# Patient Record
Sex: Male | Born: 1950
Health system: Southern US, Community
[De-identification: ages and names within clinical notes are randomized; demographics above are authoritative.]

## PROBLEM LIST (undated history)

## (undated) DIAGNOSIS — G4730 Sleep apnea, unspecified: Secondary | ICD-10-CM

## (undated) DIAGNOSIS — E119 Type 2 diabetes mellitus without complications: Secondary | ICD-10-CM

## (undated) DIAGNOSIS — I10 Essential (primary) hypertension: Secondary | ICD-10-CM

## (undated) DIAGNOSIS — E785 Hyperlipidemia, unspecified: Secondary | ICD-10-CM

## (undated) DIAGNOSIS — E11319 Type 2 diabetes mellitus with unspecified diabetic retinopathy without macular edema: Secondary | ICD-10-CM

## (undated) DIAGNOSIS — I219 Acute myocardial infarction, unspecified: Secondary | ICD-10-CM

## (undated) DIAGNOSIS — H269 Unspecified cataract: Secondary | ICD-10-CM

## (undated) DIAGNOSIS — G4733 Obstructive sleep apnea (adult) (pediatric): Secondary | ICD-10-CM

## (undated) DIAGNOSIS — C801 Malignant (primary) neoplasm, unspecified: Secondary | ICD-10-CM

## (undated) DIAGNOSIS — M1990 Unspecified osteoarthritis, unspecified site: Secondary | ICD-10-CM

## (undated) DIAGNOSIS — K219 Gastro-esophageal reflux disease without esophagitis: Secondary | ICD-10-CM

## (undated) DIAGNOSIS — T7840XA Allergy, unspecified, initial encounter: Secondary | ICD-10-CM

## (undated) DIAGNOSIS — K5909 Other constipation: Secondary | ICD-10-CM

## (undated) HISTORY — DX: Unspecified osteoarthritis, unspecified site: M19.90

## (undated) HISTORY — PX: APPENDECTOMY: SHX54

## (undated) HISTORY — DX: Acute myocardial infarction, unspecified: I21.9

## (undated) HISTORY — DX: Essential (primary) hypertension: I10

## (undated) HISTORY — DX: Sleep apnea, unspecified: G47.30

## (undated) HISTORY — DX: Hyperlipidemia, unspecified: E78.5

## (undated) HISTORY — DX: Malignant (primary) neoplasm, unspecified: C80.1

## (undated) HISTORY — DX: Type 2 diabetes mellitus without complications: E11.9

## (undated) HISTORY — DX: Gastro-esophageal reflux disease without esophagitis: K21.9

## (undated) HISTORY — DX: Type 2 diabetes mellitus with unspecified diabetic retinopathy without macular edema: E11.319

## (undated) HISTORY — PX: CHOLECYSTECTOMY: SHX55

## (undated) HISTORY — DX: Allergy, unspecified, initial encounter: T78.40XA

## (undated) HISTORY — DX: Obstructive sleep apnea (adult) (pediatric): G47.33

## (undated) HISTORY — DX: Other constipation: K59.09

## (undated) HISTORY — DX: Unspecified cataract: H26.9

## (undated) HISTORY — PX: PROSTATE SURGERY: SHX751

---

## 1998-01-23 ENCOUNTER — Encounter: Payer: Self-pay | Admitting: General Surgery

## 1998-01-23 ENCOUNTER — Inpatient Hospital Stay (HOSPITAL_COMMUNITY): Admission: EM | Admit: 1998-01-23 | Discharge: 1998-01-24 | Payer: Self-pay | Admitting: Emergency Medicine

## 1998-03-23 ENCOUNTER — Other Ambulatory Visit: Admission: RE | Admit: 1998-03-23 | Discharge: 1998-03-23 | Payer: Self-pay | Admitting: Urology

## 1998-08-19 ENCOUNTER — Emergency Department (HOSPITAL_COMMUNITY): Admission: EM | Admit: 1998-08-19 | Discharge: 1998-08-19 | Payer: Self-pay | Admitting: Emergency Medicine

## 1998-08-30 ENCOUNTER — Encounter: Payer: Self-pay | Admitting: Urology

## 1998-09-03 ENCOUNTER — Inpatient Hospital Stay (HOSPITAL_COMMUNITY): Admission: RE | Admit: 1998-09-03 | Discharge: 1998-09-06 | Payer: Self-pay | Admitting: Urology

## 1998-10-10 ENCOUNTER — Ambulatory Visit: Admission: RE | Admit: 1998-10-10 | Discharge: 1998-10-10 | Payer: Self-pay | Admitting: Pulmonary Disease

## 1998-10-23 ENCOUNTER — Ambulatory Visit: Admission: RE | Admit: 1998-10-23 | Discharge: 1998-10-23 | Payer: Self-pay | Admitting: Pulmonary Disease

## 1999-07-15 ENCOUNTER — Ambulatory Visit (HOSPITAL_COMMUNITY): Admission: RE | Admit: 1999-07-15 | Discharge: 1999-07-16 | Payer: Self-pay | Admitting: Urology

## 2004-01-05 ENCOUNTER — Ambulatory Visit: Payer: Self-pay | Admitting: Family Medicine

## 2004-01-16 ENCOUNTER — Ambulatory Visit: Payer: Self-pay | Admitting: Family Medicine

## 2004-02-02 ENCOUNTER — Ambulatory Visit: Payer: Self-pay | Admitting: Family Medicine

## 2004-05-03 ENCOUNTER — Ambulatory Visit: Payer: Self-pay | Admitting: Family Medicine

## 2004-07-24 ENCOUNTER — Emergency Department (HOSPITAL_COMMUNITY): Admission: EM | Admit: 2004-07-24 | Discharge: 2004-07-24 | Payer: Self-pay | Admitting: Emergency Medicine

## 2005-01-03 ENCOUNTER — Encounter: Admission: RE | Admit: 2005-01-03 | Discharge: 2005-03-02 | Payer: Self-pay | Admitting: Family Medicine

## 2009-08-31 ENCOUNTER — Encounter: Admission: RE | Admit: 2009-08-31 | Discharge: 2009-08-31 | Payer: Self-pay | Admitting: Emergency Medicine

## 2009-09-14 ENCOUNTER — Encounter: Admission: RE | Admit: 2009-09-14 | Discharge: 2009-09-14 | Payer: Self-pay | Admitting: Emergency Medicine

## 2010-01-08 ENCOUNTER — Encounter: Payer: Self-pay | Admitting: Gastroenterology

## 2010-04-02 NOTE — Letter (Signed)
Summary: [facility]EC Referral (unable to schedule) Notification  [facility]  [address] [place], [place] [address]   Phone: [phone]  Fax: [phone]      January 08, 2010 [name] 23-Nov-1950 MRN: [id number]   [facility] [place] [facility] [address] [place], [place]  [address]   Dear [name]:   Thank you for your kind referral of the above patient. We have attempted to schedule the recommended COLONOSCOPY but have been unable to schedule because:  _X_ The patient was not available by phone and/or has not returned our calls.  __ The patient declined to schedule the procedure at this time.  We appreciate the referral and hope that we will have the opportunity to treat this patient in the future.    Sincerely,   [facility]  Vania Rea. Jarold Motto M.D. Hedwig Morton. Juanda Chance M.D. Venita Lick. Russella Dar M.D. Wilhemina Bonito. Marina Goodell M.D. Barbette Hair. Arlyce Dice M.D. Iva Boop M.D. Cheron Every.D.

## 2010-07-19 NOTE — Op Note (Signed)
[place]  Patient:    [name]                       MRN: [id number] Proc. Date: 07/15/99 Adm. Date:  [id number] Attending:  Trisha Mangle                           Operative Report  PREOPERATIVE DIAGNOSIS:  Organic erectile dysfunction.  POSTOPERATIVE DIAGNOSIS:  Organic erectile dysfunction.  OPERATION PERFORMED:  Inflatable penile prosthesis placement (Mentor Alpha 1).  SURGEON:  Mark C. Vernie Ammons, M.D.  ANESTHESIA:  General.  ESTIMATED BLOOD LOSS:  20 cc.  DEVICE:  18 cm cylinders with 2 cm rear tip extenders on each side and a 75 cc lock-out reservoir.  COMPLICATIONS:  None.  INDICATIONS FOR PROCEDURE:  The patient is a 60 year old white male who was rendered impotent after a radical prostatectomy.  Conservative measures have been attempted using intraurethral injections, etc.  He is brought to the or for placement of a penile prosthesis understanding the risks, complications, alternatives and limitations.  He received ampicillin 1 gm and gentamicin 80 mg IV preoperatively.  DESCRIPTION OF PROCEDURE:  After informed consent patient brought to the major OR, placed on the table in the supine position, administered general anesthesia and the genitalia was sterilely scrubbed for 10 minutes and then prepped with Betadine and sterilely draped.  A 16 French Foley catheter was inserted in the bladder and antibiotic irrigation was inserted as well and left indwelling in the bladder.  I then made a midline medial raphe incision in the scrotum and using a combination of sharp and blunt technique, I dissected down to the corpora bilaterally.  Stay sutures of 2-0 Vicryls were placed in the corpora after clearing of the surrounding tissues from the corpora itself and corporotomies were made on both right and left sides.  They were lengthened with the curved Mayo scissors.  I then dilated each corpora with the Hagar dilators starting  at 8 and progressing to 13 mm on each side without difficulty both proximally and distally.  Each corpora was then irrigated with antibiotic solution and the measuring device was placed in the corpora.  To midcorporotomy each proximal corpora measured 9 cm and each distal corpora measured 11 cm.  The midline incision was then made and carried down to the rectus fascia which was divided in the midline and the rectus muscle bellies parted.  I was easily able to develop a retropubic pocket for the reservoir over on the right hand side and placed this 75 cc reservoir within this location and also instilled antibiotic irrigation.  I then closed the fascia with running 2-0 Vicryl suture.  The tubing exiting the inferior aspect of the incision.  The subcutaneous tissue was reapproximated loosely with a couple of interrupted chromic sutures and 0.5% Marcaine with epinephrine was used to infiltrate the incision and it was then closed with a running subcuticular 4-0 Vicryl suture. Prior to closing the incision, the tubing was tunneled down into the scrotum on the right hand side.  The 18 cm cylinders were then placed using the Furlow inserter in each corpora bilaterally.  The 2 cm rear tip extenders were then applied and the proximal aspects inserted.  The device was inflated fully and a good straight erection occurred with no kinking of the cylinders.  It was then deflated and I closed each corporotomy with running 2-0  Vicryl suture.  I then developed a pocket for the scrotal pump on the right hand side.  This was placed in a dependent portion laterally and I then filled the reservoir with 70 cc of sterile saline.  Redundant tubing was cut and connection between the single tubing from the pump to the reservoir was then made.  The device was again cycled and noted to work properly.  No excessive bleeding was noted.  The deep scrotal tissues were reapproximated with interrupted 3-0 chromic suture.   A second layer of scrotal tissue was then reapproximated with running 3-0 chromic and the skin of the scrotum was closed with a running 3-0 chromic suture.  The bladder was drained and connected to closed system drainage.  The superpubic incision was covered with Steri-Strips and a sterile occlusive dressing.  The scrotal incision was covered with a sterile gauze dressing, fluffs, 4 x 4s and a scrotal support.  The patient was awakened and taken to the recovery room in stable and satisfactory condition.  He tolerated the procedure well with no intraoperative complications.  Sponge, needle and instrument counts were reportedly correct x 2 at the end of the operation. DD:  07/15/99 TD:  07/16/99 Job: [id number]

## 2011-02-15 ENCOUNTER — Ambulatory Visit (INDEPENDENT_AMBULATORY_CARE_PROVIDER_SITE_OTHER): Payer: BC Managed Care – PPO

## 2011-02-15 DIAGNOSIS — E789 Disorder of lipoprotein metabolism, unspecified: Secondary | ICD-10-CM

## 2011-02-15 DIAGNOSIS — Z23 Encounter for immunization: Secondary | ICD-10-CM

## 2011-02-15 DIAGNOSIS — I10 Essential (primary) hypertension: Secondary | ICD-10-CM

## 2011-02-15 DIAGNOSIS — R300 Dysuria: Secondary | ICD-10-CM

## 2011-02-15 DIAGNOSIS — E119 Type 2 diabetes mellitus without complications: Secondary | ICD-10-CM

## 2011-10-26 ENCOUNTER — Other Ambulatory Visit: Payer: Self-pay | Admitting: Emergency Medicine

## 2011-11-09 ENCOUNTER — Ambulatory Visit (INDEPENDENT_AMBULATORY_CARE_PROVIDER_SITE_OTHER): Payer: BC Managed Care – PPO | Admitting: Emergency Medicine

## 2011-11-09 VITALS — BP 131/79 | HR 104 | Temp 98.5°F | Resp 16 | Ht 69.0 in | Wt 240.0 lb

## 2011-11-09 DIAGNOSIS — C61 Malignant neoplasm of prostate: Secondary | ICD-10-CM

## 2011-11-09 DIAGNOSIS — Z9119 Patient's noncompliance with other medical treatment and regimen: Secondary | ICD-10-CM

## 2011-11-09 DIAGNOSIS — L84 Corns and callosities: Secondary | ICD-10-CM

## 2011-11-09 DIAGNOSIS — E782 Mixed hyperlipidemia: Secondary | ICD-10-CM

## 2011-11-09 DIAGNOSIS — E119 Type 2 diabetes mellitus without complications: Secondary | ICD-10-CM

## 2011-11-09 DIAGNOSIS — E118 Type 2 diabetes mellitus with unspecified complications: Secondary | ICD-10-CM

## 2011-11-09 LAB — POCT GLYCOSYLATED HEMOGLOBIN (HGB A1C): Hemoglobin A1C: 8.1

## 2011-11-09 MED ORDER — CARVEDILOL 3.125 MG PO TABS: 3.1250 mg | ORAL_TABLET | Freq: Two times a day (BID) | ORAL | Status: DC

## 2011-11-09 MED ORDER — METFORMIN HCL 1000 MG PO TABS: 1000.0000 mg | ORAL_TABLET | Freq: Two times a day (BID) | ORAL | Status: DC

## 2011-11-09 MED ORDER — GABAPENTIN 300 MG PO CAPS: 300.0000 mg | ORAL_CAPSULE | Freq: Three times a day (TID) | ORAL | Status: DC

## 2011-11-09 MED ORDER — SIMVASTATIN 40 MG PO TABS: 40.0000 mg | ORAL_TABLET | Freq: Every evening | ORAL | Status: DC

## 2011-11-09 MED ORDER — LISINOPRIL 10 MG PO TABS: 10.0000 mg | ORAL_TABLET | Freq: Every day | ORAL | Status: DC

## 2011-11-09 MED ORDER — GLIMEPIRIDE 4 MG PO TABS: 4.0000 mg | ORAL_TABLET | Freq: Every day | ORAL | Status: DC

## 2011-11-09 MED ORDER — AZELASTINE HCL 0.1 % NA SOLN: 1.0000 | Freq: Two times a day (BID) | NASAL | Status: DC

## 2011-11-09 MED ORDER — CIPROFLOXACIN HCL 500 MG PO TABS: 500.0000 mg | ORAL_TABLET | Freq: Two times a day (BID) | ORAL | Status: AC

## 2011-11-09 NOTE — Progress Notes (Addendum)
Date:  11/09/2011   Name:  [name]   DOB:  [birth year]   MRN:  [id number] Gender: male Age: 61 y.o.  PCP:  Tally Due, MD    Chief Complaint: Medication Refill and Foot Injury   History of Present Illness:  [name] is a 61 y.o. pleasant patient who presents with the following:  NIDDM.  Has neuropathy of feet.  Noticed a non healing sore on his left great toe from new boots.  First noticed 4-5 days ago.  Tried to soak it to make it better and has not improved.  No fever or chills.  No other complaints.  FBS not been measuring.  02/15/11 HBA1C was 9.9.  Non smoker.  Chews tobacco.  Not compliant with diet.  There is no problem list on file for this patient.   No past medical history on file.  No past surgical history on file.  History  Substance Use Topics  . Smoking status: Never Smoker   . Smokeless tobacco: Current User    Types: Chew  . Alcohol Use: Not on file    No family history on file.  Allergies  Allergen Reactions  . Darvocet (Propoxyphene-Acetaminophen) Hives    Medication list has been reviewed and updated.  Current Outpatient Prescriptions on File Prior to Visit  Medication Sig Dispense Refill  . azelastine (ASTELIN) 137 MCG/SPRAY nasal spray Place 1 spray into the nose 2 (two) times daily. Use in each nostril as directed      . carvedilol (COREG) 3.125 MG tablet Take 3.125 mg by mouth 2 (two) times daily with a meal.      . gabapentin (NEURONTIN) 300 MG capsule Take 300 mg by mouth 3 (three) times daily.      Marland Kitchen lisinopril (PRINIVIL,ZESTRIL) 10 MG tablet Take 10 mg by mouth daily.      . metFORMIN (GLUCOPHAGE) 1000 MG tablet Take 1,000 mg by mouth 2 (two) times daily with a meal.      . simvastatin (ZOCOR) 40 MG tablet Take 40 mg by mouth every evening.        Review of Systems:  As per HPI, otherwise negative.    Physical Examination: Filed Vitals:   11/09/11 1337  BP: 131/79  Pulse: 104  Temp: 98.5 F (36.9 C)    Resp: 16   Filed Vitals:   11/09/11 1337  Height: 5\' 9"  (1.753 m)  Weight: 240 lb (108.863 kg)   Body mass index is 35.44 kg/(m^2). Ideal Body Weight: Weight in (lb) to have BMI = 25: 168.9    GEN: WDWN, NAD, Non-toxic, Alert & Oriented x 3 HEENT: Atraumatic, Normocephalic.  Ears and Nose: No external deformity. EXTR: No clubbing/cyanosis/edema  Pulses intact feet 2+.  Left great toe medial aspect has callous of great toe.  Minimal cellulitis. NEURO: Normal gait.  PSYCH: Normally interactive. Conversant. Not depressed or anxious appearing.  Calm demeanor.    Assessment and Plan: NIDDM Callous great toe HBA1C Debride callous Cipro for cellulitis  Discussed with patient the importance of following a diet and working dilligently to ger his sugar under control.  To check his sugar FBS daily and add gimepiride 4 mg daily.  Follow up in 1 month for repeat labs.  STOP TOBACCO.  Carmelina Dane, MD   Results for orders placed in visit on 11/09/11  POCT GLYCOSYLATED HEMOGLOBIN (HGB A1C)      Component Value Range   Hemoglobin A1C 8.1  Urine microalb/monofilament exam at f/u I have reviewed and agree with documentation. Robert P. Merla Riches, M.D.

## 2012-01-31 ENCOUNTER — Other Ambulatory Visit: Payer: Self-pay | Admitting: Emergency Medicine

## 2012-03-10 ENCOUNTER — Other Ambulatory Visit: Payer: Self-pay | Admitting: Emergency Medicine

## 2012-03-11 NOTE — Telephone Encounter (Signed)
Please pull chart.

## 2012-03-12 NOTE — Telephone Encounter (Signed)
[id number] is in [place] Pool at nurse's station.

## 2012-03-21 NOTE — Telephone Encounter (Signed)
Chart may have inadvertently been filed back. Please pull again.

## 2012-03-22 NOTE — Telephone Encounter (Signed)
[id number] in [place] Pool at nurse's station.

## 2012-03-23 NOTE — Telephone Encounter (Signed)
Omeprazole sent to pharmacy per refill request.  Pt needs OV, was due for repeat labs in Oct 2013

## 2012-03-28 ENCOUNTER — Ambulatory Visit (INDEPENDENT_AMBULATORY_CARE_PROVIDER_SITE_OTHER): Payer: BC Managed Care – PPO | Admitting: Emergency Medicine

## 2012-03-28 VITALS — BP 130/86 | HR 80 | Temp 98.6°F | Resp 18 | Ht 70.5 in | Wt 254.8 lb

## 2012-03-28 DIAGNOSIS — J018 Other acute sinusitis: Secondary | ICD-10-CM

## 2012-03-28 MED ORDER — AMOXICILLIN-POT CLAVULANATE 875-125 MG PO TABS: 1.0000 | ORAL_TABLET | Freq: Two times a day (BID) | ORAL | Status: DC

## 2012-03-28 MED ORDER — PSEUDOEPHEDRINE-GUAIFENESIN ER 60-600 MG PO TB12: 1.0000 | ORAL_TABLET | Freq: Two times a day (BID) | ORAL | Status: DC

## 2012-03-28 NOTE — Patient Instructions (Addendum)

## 2012-03-28 NOTE — Progress Notes (Signed)
[facility] [address], [place] [address] [phone]  Date:  03/28/2012   Name:  [name]   DOB:  1951/01/05   MRN:  [id number]  PCP:  Tally Due, MD    Chief Complaint: Sinusitis, Generalized Body Aches and fever, chiils   History of Present Illness:  [name] is a 62 y.o. very pleasant male patient who presents with the following:  Ill with fever, chills, and cough for past week. Nonproductive cough.  Moderate nasal congestion and drainage.  Purulent in nature.  Has post nasal drainage.  Purulent in nature.  No wheezing or shortness of breath.  Relates a significant improvement in his FBS since medication and weight loss since last visit.  No nausea or vomiting.  No stool change.  There is no problem list on file for this patient.   No past medical history on file.  No past surgical history on file.  History  Substance Use Topics  . Smoking status: Never Smoker   . Smokeless tobacco: Current User    Types: Chew  . Alcohol Use: Not on file    No family history on file.  Allergies  Allergen Reactions  . Darvocet (Propoxyphene-Acetaminophen) Hives    Medication list has been reviewed and updated.  Current Outpatient Prescriptions on File Prior to Visit  Medication Sig Dispense Refill  . aspirin 81 MG tablet Take 81 mg by mouth daily.      Marland Kitchen azelastine (ASTELIN) 137 MCG/SPRAY nasal spray Place 1 spray into the nose 2 (two) times daily. Use in each nostril as directed  30 mL  5  . carvedilol (COREG) 3.125 MG tablet Take 1 tablet (3.125 mg total) by mouth 2 (two) times daily with a meal.  60 tablet  5  . glimepiride (AMARYL) 4 MG tablet Take 1 tablet (4 mg total) by mouth daily before breakfast.  30 tablet  5  . ketoconazole (NIZORAL) 2 % cream Apply topically daily.      Marland Kitchen lisinopril (PRINIVIL,ZESTRIL) 10 MG tablet Take 1 tablet (10 mg total) by mouth daily.  30 tablet  5  . metFORMIN (GLUCOPHAGE) 1000 MG tablet Take 1  tablet (1,000 mg total) by mouth 2 (two) times daily with a meal.  60 tablet  5  . omeprazole (PRILOSEC) 40 MG capsule TAKE ONE CAPSULE BY MOUTH EVERY DAY  30 capsule  1  . simvastatin (ZOCOR) 40 MG tablet TAKE 1 TABLET (40 MG TOTAL) BY MOUTH EVERY EVENING.  30 tablet  0  . gabapentin (NEURONTIN) 300 MG capsule Take 1 capsule (300 mg total) by mouth 3 (three) times daily.  90 capsule  5    Review of Systems: As per HPI, otherwise negative.    Physical Examination: Filed Vitals:   03/28/12 1320  BP: 130/86  Pulse: 80  Temp: 98.6 F (37 C)  Resp: 18   Filed Vitals:   03/28/12 1320  Height: 5' 10.5" (1.791 m)  Weight: 254 lb 12.8 oz (115.577 kg)   Body mass index is 36.04 kg/(m^2). Ideal Body Weight: Weight in (lb) to have BMI = 25: 176.4   GEN: WDWN, NAD, Non-toxic, A & O x 3 HEENT: Atraumatic, Normocephalic. Neck supple. No masses, No LAD. Ears and Nose: No external deformity. CV: RRR, No M/G/R. No JVD. No thrill. No extra heart sounds. PULM: CTA B, no wheezes, crackles, rhonchi. No retractions. No resp. distress. No accessory muscle use. ABD: S, NT, ND, +BS.  No rebound. No HSM. EXTR: No c/c/e NEURO Normal gait.  PSYCH: Normally interactive. Conversant. Not depressed or anxious appearing.  Calm demeanor.    Assessment and Plan: Sinusitis augmentin mucinex d  Carmelina Dane, MD

## 2012-04-08 ENCOUNTER — Other Ambulatory Visit: Payer: Self-pay | Admitting: Physician Assistant

## 2012-04-27 DIAGNOSIS — E119 Type 2 diabetes mellitus without complications: Secondary | ICD-10-CM | POA: Insufficient documentation

## 2012-05-12 ENCOUNTER — Other Ambulatory Visit: Payer: Self-pay | Admitting: Emergency Medicine

## 2012-05-12 NOTE — Telephone Encounter (Signed)
Needs OV, labs 

## 2012-06-18 ENCOUNTER — Other Ambulatory Visit: Payer: Self-pay | Admitting: Physician Assistant

## 2012-07-13 ENCOUNTER — Ambulatory Visit (INDEPENDENT_AMBULATORY_CARE_PROVIDER_SITE_OTHER): Payer: BC Managed Care – PPO | Admitting: Family Medicine

## 2012-07-13 ENCOUNTER — Ambulatory Visit: Payer: BC Managed Care – PPO

## 2012-07-13 VITALS — BP 138/77 | HR 77 | Temp 98.2°F | Resp 18 | Wt 240.0 lb

## 2012-07-13 DIAGNOSIS — M25569 Pain in unspecified knee: Secondary | ICD-10-CM

## 2012-07-13 DIAGNOSIS — M545 Low back pain, unspecified: Secondary | ICD-10-CM

## 2012-07-13 DIAGNOSIS — M25562 Pain in left knee: Secondary | ICD-10-CM

## 2012-07-13 MED ORDER — CYCLOBENZAPRINE HCL 5 MG PO TABS: ORAL_TABLET | ORAL | Status: DC

## 2012-07-13 MED ORDER — TRAMADOL HCL 50 MG PO TABS: 50.0000 mg | ORAL_TABLET | Freq: Four times a day (QID) | ORAL | Status: DC | PRN

## 2012-07-13 NOTE — Progress Notes (Signed)
Subjective:    Patient ID: [name], male    DOB: [birth year], 62 y.o.   MRN: [id number]  HPI [name] is a 62 y.o. male  PCP: [facility] - different doctors past few years.   Back pain into L leg and knee - started 2 weeks ago after carrying heavy pipe for 2 days. Started in low back initially - night after moving the pipe.  nki during lifting, just later that night. then noticed pain into both legs more 2 days later. Hx of prostate cancer - wears depends past 14 years for urinary incontinence with lifting or bending.  Does note a little more predominance of the urinary incontinence since back has been hurting, or with lifting, but back to baseline now. No fecal incontinence. Slight saddle numbness and pain noted last night and yesterday evening after lifting something heavy.  These symptoms resolved today. - no further saddle symptoms. Left leg felt weak few days ago - started to fall/give away. Pain down outside of L leg - to knee. Felt spasms/jumping into outside of leg muscle. Persistent soreness in outside of leg. Hurts to put pressure on outside of the leg.  Back better in seated position.   Tx: none.  Hx of diabetic neuropathy in feet. On gabapentin for neuropathy. Hx of diabetes home blood sugars 97 up to 190. Last A1c 8.1 on 11/09/11.   Review of Systems  Musculoskeletal: Positive for myalgias (outside of L leg with occasional numbness on outside of leg. ), back pain and arthralgias.       Objective:   Physical Exam  Vitals reviewed. Constitutional: He is oriented to person, place, and time.  Overweight.   HENT:  Head: Normocephalic and atraumatic.  Pulmonary/Chest: Effort normal.  Musculoskeletal:       Back:  Neurological: He is alert and oriented to person, place, and time. No sensory deficit. He displays no Babinski's sign on the right side. He displays no Babinski's sign on the left side.  Reflex Scores:      Patellar reflexes are 0 on the right side and 0 on the  left side.      Achilles reflexes are 0 on the right side and 0 on the left side. Able to heel and toe walk, equal le strength testing.   Skin: Skin is warm and dry. No rash noted.  Psychiatric: He has a normal mood and affect. His behavior is normal.   [facility] reading (PRIMARY) by  Dr. Neva Seat: LS spine: decreased disc space L1-2 and L4-5.     Assessment & Plan:  [name] is a 62 y.o. male Lower back pain - Plan: DG Lumbar Spine Complete, cyclobenzaprine (FLEXERIL) 5 MG tablet, traMADol (ULTRAM) 50 MG tablet  Pain in joint, lower leg, left - Plan: traMADol (ULTRAM) 50 MG tablet  LBP, with likely overuse lumbar strain vs HNP/bulging disc.   Initially admitted to possible saddle sx's, but not experiencing those today and chronic incontinence at baseline today. Negative SLR and strength intact into extremities on exam.  Will try initial pain relief with ultram Q6 hours, flexeril if needed for spasm - caution with combining - additive side effects discussed, avoid lifting/twisting/bending, heat or ice to affected area and recheck in 2-3 days - sooner if worse, or to ER if any recurrence of saddle pain/numbness or change in incontinence.  Understanding expressed. Deferred prednisone and nsaids at this point given hx of dm, htn, hyperlipidemia.  If not improving in next few  days, consider prednisone but would check glycemic control at that point.   Plans on following up for arm pain - intermittent for some time - hx of broken bone in that arm in past . Can follow up at next ov.   Meds ordered this encounter  Medications  . cyclobenzaprine (FLEXERIL) 5 MG tablet    Sig: 1 pill by mouth up to every 8 hours as needed. Start with one pill by mouth each bedtime as needed due to sedation    Dispense:  15 tablet    Refill:  0  . traMADol (ULTRAM) 50 MG tablet    Sig: Take 1 tablet (50 mg total) by mouth every 6 (six) hours as needed for pain.    Dispense:  20 tablet    Refill:  0     Patient  Instructions  Start the muscle relaxant (flexeril) up to every 8 hours as needed, heat or ice to sore area for 15 minutes at a time as needed, tramadol if needed for pain, but be careful combining this with flexeril.  No lifting/bending/twisting until seen in follow up in 2-3 days. Return to the clinic or go to the nearest emergency room if any of your symptoms worsen or new symptoms occur, including if the numbness or pain in the genital area or change in incontinence returns.

## 2012-07-13 NOTE — Patient Instructions (Addendum)
Start the muscle relaxant (flexeril) up to every 8 hours as needed, heat or ice to sore area for 15 minutes at a time as needed, tramadol if needed for pain, but be careful combining this with flexeril.  No lifting/bending/twisting until seen in follow up in 2-3 days. Return to the clinic or go to the nearest emergency room if any of your symptoms worsen or new symptoms occur, including if the numbness or pain in the genital area or change in incontinence returns.

## 2012-07-16 ENCOUNTER — Ambulatory Visit (INDEPENDENT_AMBULATORY_CARE_PROVIDER_SITE_OTHER): Payer: BC Managed Care – PPO | Admitting: Family Medicine

## 2012-07-16 VITALS — BP 142/82 | HR 72 | Temp 98.4°F | Resp 18 | Ht 69.5 in | Wt 234.0 lb

## 2012-07-16 DIAGNOSIS — M545 Low back pain, unspecified: Secondary | ICD-10-CM

## 2012-07-16 DIAGNOSIS — Z5189 Encounter for other specified aftercare: Secondary | ICD-10-CM

## 2012-07-16 DIAGNOSIS — S39012D Strain of muscle, fascia and tendon of lower back, subsequent encounter: Secondary | ICD-10-CM

## 2012-07-16 NOTE — Patient Instructions (Addendum)
Continue muscle relaxants and pain medication as necessary  In the long run you need to work hard on eating less to lose weight.  Try to avoid heavy lifting and straining.
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
## 2012-07-26 ENCOUNTER — Other Ambulatory Visit: Payer: Self-pay | Admitting: Physician Assistant

## 2012-09-02 ENCOUNTER — Other Ambulatory Visit: Payer: Self-pay | Admitting: Physician Assistant

## 2012-09-25 ENCOUNTER — Other Ambulatory Visit: Payer: Self-pay | Admitting: Physician Assistant

## 2012-09-26 ENCOUNTER — Ambulatory Visit (INDEPENDENT_AMBULATORY_CARE_PROVIDER_SITE_OTHER): Payer: BC Managed Care – PPO | Admitting: Family Medicine

## 2012-09-26 DIAGNOSIS — I10 Essential (primary) hypertension: Secondary | ICD-10-CM

## 2012-09-26 DIAGNOSIS — E119 Type 2 diabetes mellitus without complications: Secondary | ICD-10-CM

## 2012-09-26 DIAGNOSIS — E7800 Pure hypercholesterolemia, unspecified: Secondary | ICD-10-CM

## 2012-09-26 DIAGNOSIS — T23122A Burn of first degree of single left finger (nail) except thumb, initial encounter: Secondary | ICD-10-CM

## 2012-09-26 DIAGNOSIS — T23129A Burn of first degree of unspecified single finger (nail) except thumb, initial encounter: Secondary | ICD-10-CM

## 2012-09-26 DIAGNOSIS — C61 Malignant neoplasm of prostate: Secondary | ICD-10-CM

## 2012-09-26 DIAGNOSIS — Z23 Encounter for immunization: Secondary | ICD-10-CM

## 2012-09-26 LAB — COMPREHENSIVE METABOLIC PANEL
ALT: 40 U/L (ref 0–53)
AST: 21 U/L (ref 0–37)
Alkaline Phosphatase: 74 U/L (ref 39–117)
BUN: 11 mg/dL (ref 6–23)
Calcium: 9.1 mg/dL (ref 8.4–10.5)
Creat: 0.72 mg/dL (ref 0.50–1.35)
Total Bilirubin: 0.7 mg/dL (ref 0.3–1.2)

## 2012-09-26 LAB — LIPID PANEL
Cholesterol: 218 mg/dL — ABNORMAL HIGH (ref 0–200)
Total CHOL/HDL Ratio: 4 Ratio
Triglycerides: 175 mg/dL — ABNORMAL HIGH (ref <150)

## 2012-09-26 LAB — POCT URINALYSIS DIPSTICK
Bilirubin, UA: NEGATIVE
Glucose, UA: 100
Ketones, UA: NEGATIVE
Spec Grav, UA: 1.025
Urobilinogen, UA: 0.2

## 2012-09-26 LAB — POCT CBC
HCT, POC: 48.7 % (ref 43.5–53.7)
Hemoglobin: 15.4 g/dL (ref 14.1–18.1)
Lymph, poc: 2.5 (ref 0.6–3.4)
MCH, POC: 31.4 pg — AB (ref 27–31.2)
MCHC: 31.6 g/dL — AB (ref 31.8–35.4)
POC LYMPH PERCENT: 33.3 %L (ref 10–50)
POC MID %: 8 %M (ref 0–12)
WBC: 7.6 10*3/uL (ref 4.6–10.2)

## 2012-09-26 LAB — POCT GLYCOSYLATED HEMOGLOBIN (HGB A1C): Hemoglobin A1C: 7

## 2012-09-26 LAB — MICROALBUMIN, URINE: Microalb, Ur: 1.31 mg/dL (ref 0.00–1.89)

## 2012-09-26 MED ORDER — AZELASTINE HCL 0.1 % NA SOLN: 2.0000 | Freq: Two times a day (BID) | NASAL | Status: DC

## 2012-09-26 MED ORDER — GLIMEPIRIDE 4 MG PO TABS: 4.0000 mg | ORAL_TABLET | Freq: Every day | ORAL | Status: DC

## 2012-09-26 MED ORDER — AMOXICILLIN-POT CLAVULANATE 875-125 MG PO TABS: 1.0000 | ORAL_TABLET | Freq: Two times a day (BID) | ORAL | Status: DC

## 2012-09-26 MED ORDER — CARVEDILOL 3.125 MG PO TABS: 3.1250 mg | ORAL_TABLET | Freq: Two times a day (BID) | ORAL | Status: DC

## 2012-09-26 MED ORDER — METFORMIN HCL 1000 MG PO TABS: 1000.0000 mg | ORAL_TABLET | Freq: Two times a day (BID) | ORAL | Status: DC

## 2012-09-26 MED ORDER — LISINOPRIL 10 MG PO TABS: 10.0000 mg | ORAL_TABLET | Freq: Every day | ORAL | Status: DC

## 2012-09-26 MED ORDER — GABAPENTIN 300 MG PO CAPS: 300.0000 mg | ORAL_CAPSULE | Freq: Three times a day (TID) | ORAL | Status: DC

## 2012-09-26 MED ORDER — OMEPRAZOLE 40 MG PO CPDR: 40.0000 mg | DELAYED_RELEASE_CAPSULE | Freq: Every day | ORAL | Status: DC

## 2012-09-26 MED ORDER — SIMVASTATIN 40 MG PO TABS: 40.0000 mg | ORAL_TABLET | Freq: Every day | ORAL | Status: DC

## 2012-09-26 NOTE — Progress Notes (Signed)
[address]   [place], [place]  [address]   [phone]  Subjective:    Patient ID: [name], male    DOB: [birth year], 61 y.o.   MRN: [id number]  HPI This 62 y.o. male presents for evaluation of the following:  1.  DMII:  Taking Metformin and Amaryl; sugars running 98-120s.  +neuropathy.  Checks every morning.  Last eye exam 2009.  +neuropathy.  [facility] in [place].  Pneumovax a few years ago.  2. Burn on finger: burn occurred 5 days ago.    Took needle to drain it this morning; yellow white drainage expressed.  Has become very tender.   Occurred five days ago.  No fever/chills/sweats.  No malaise or fatigue.  Burn on plasmark; +copper material.  Copper stuck to skin.  Last Tetanus vaccine several years ago.    3.  Hyperlipidemia:  Compliance with Simvastatin daily.  Needs refill; due for labs.    4.  Diabetic peripheral neuropathy:  Gabapentin two tablets tid with good results.  If misses medication, suffers with burning, aching. No barefooted; checking feet nightly.  5. Lower back pain:  Back is improving; pain for 2-3 months.    6. HTN: complaince with Lisinopril.  Does not check BP at home.  7. Prostate cancer:  Age 71; s/p prostatectomy; needs to schedule CPE with Dr. Perrin Maltese for PSA and exam.  Nocturia x 6; chronic incontinence; wears a pad.   Review of Systems  Constitutional: Negative for fever, diaphoresis and fatigue.  Respiratory: Negative for shortness of breath, wheezing and stridor.   Cardiovascular: Positive for chest pain. Negative for palpitations and leg swelling.       Chest pain for just a few seconds last week with extreme heat exposure.  Gastrointestinal: Positive for constipation. Negative for nausea, vomiting, abdominal pain, diarrhea, blood in stool, anal bleeding and rectal pain.  Genitourinary: Negative for dysuria and frequency.  Skin: Positive for color change, pallor and wound. Negative for rash.  Neurological: Positive for numbness. Negative for  dizziness, tremors, seizures, syncope, facial asymmetry, speech difficulty, weakness, light-headedness and headaches.    Past Medical History  Diagnosis Date  . Diabetes mellitus without complication   . Hypertension   . Hyperlipidemia   . GERD (gastroesophageal reflux disease)   . Cancer     Prostate cancer age 74; Lupron treatments followed by prostatectomy.  . Myocardial infarction     age 83; no stenting.  . OSA (obstructive sleep apnea)     non-compliant with CPAP.    Past Surgical History  Procedure Laterality Date  . Cholecystectomy    . Prostate surgery      Prior to Admission medications   Medication Sig Start Date End Date Taking? Authorizing Provider  aspirin 81 MG tablet Take 81 mg by mouth daily.   Yes Historical Provider, MD  azelastine (ASTELIN) 137 MCG/SPRAY nasal spray Place 1 spray into the nose 2 (two) times daily. Use in each nostril as directed 11/09/11  Yes Phillips Odor, MD  gabapentin (NEURONTIN) 300 MG capsule Take 1 capsule (300 mg total) by mouth 3 (three) times daily. 09/02/12  Yes Shade Flood, MD  lisinopril (PRINIVIL,ZESTRIL) 10 MG tablet TAKE 1 TABLET (10 MG TOTAL) BY MOUTH DAILY. 07/26/12  Yes Ryan M Dunn, PA-C  metFORMIN (GLUCOPHAGE) 1000 MG tablet Take 1 tablet (1,000 mg total) by mouth 2 (two) times daily with a meal. PATIENT NEEDS OFFICE VISIT FOR ADDITIONAL REFILLS 07/26/12  Yes Sondra Barges, PA-C  omeprazole (PRILOSEC) 40 MG capsule TAKE ONE CAPSULE BY MOUTH EVERY DAY 03/10/12  Yes Godfrey Pick, PA-C  simvastatin (ZOCOR) 40 MG tablet TAKE 1 TABLET (40 MG TOTAL) BY MOUTH EVERY EVENING. 01/31/12  Yes Morrell Riddle, PA-C  carvedilol (COREG) 3.125 MG tablet Take 1 tablet (3.125 mg total) by mouth 2 (two) times daily with a meal. PATIENT NEEDS OFFICE VISIT FOR ADDITIONAL REFILLS 07/26/12   Raymon Mutton Dunn, PA-C  cyclobenzaprine (FLEXERIL) 5 MG tablet 1 pill by mouth up to every 8 hours as needed. Start with one pill by mouth each bedtime as needed due to  sedation 07/13/12   Shade Flood, MD  glimepiride (AMARYL) 4 MG tablet TAKE 1 TABLET (4 MG TOTAL) BY MOUTH DAILY BEFORE BREAKFAST. 07/26/12   Raymon Mutton Dunn, PA-C  ketoconazole (NIZORAL) 2 % cream Apply topically daily.    Historical Provider, MD  traMADol (ULTRAM) 50 MG tablet Take 1 tablet (50 mg total) by mouth every 6 (six) hours as needed for pain. 07/13/12   Shade Flood, MD    Allergies  Allergen Reactions  . Darvocet (Propoxyphene-Acetaminophen) Hives    History   Social History  . Marital Status: Single    Spouse Name: N/A    Number of Children: N/A  . Years of Education: N/A   Occupational History  . Not on file.   Social History Main Topics  . Smoking status: Never Smoker   . Smokeless tobacco: Current User    Types: Chew  . Alcohol Use: 0.0 oz/week     Comment: social  . Drug Use: No  . Sexually Active: Not Currently   Other Topics Concern  . Not on file   Social History Narrative   Marital status: divorced     Children: 1      Employment:        Tobacco: none; chew tobacco      Alcohol: beers on weekends      Exercise: none    Family History  Problem Relation Age of Onset  . Cancer Mother     leukemia  . Diabetes Mother   . Cancer Father 64    lung cancer with mets  . Heart disease Maternal Grandmother   . Cancer Maternal Grandfather   . Heart disease Paternal Grandmother   . Cancer Paternal Grandfather        Objective:   Physical Exam  Nursing note and vitals reviewed. Constitutional: He is oriented to person, place, and time. He appears well-developed and well-nourished. No distress.  HENT:  Head: Normocephalic and atraumatic.  Right Ear: External ear normal.  Left Ear: External ear normal.  Nose: Nose normal.  Mouth/Throat: Oropharynx is clear and moist.  Eyes: Conjunctivae and EOM are normal. Pupils are equal, round, and reactive to light.  Neck: Normal range of motion. Neck supple. No JVD present. No thyromegaly present.    Cardiovascular: Normal rate, regular rhythm, normal heart sounds and intact distal pulses.  Exam reveals no gallop and no friction rub.   No murmur heard. Pulmonary/Chest: Effort normal and breath sounds normal. He has no wheezes. He has no rales.  Abdominal: Soft. Bowel sounds are normal. He exhibits no distension. There is no tenderness. There is no rebound and no guarding.  Lymphadenopathy:    He has no cervical adenopathy.  Neurological: He is alert and oriented to person, place, and time. No cranial nerve deficit. He exhibits normal muscle tone. Coordination normal.  Monofilament intact B  feet.  Skin: Skin is warm and dry. He is not diaphoretic.  3rd finger with 2 cm x 8mm burn with surrounding erythema; no fluctuants; +TTP proximal aspect of finger at base of burn.  Psychiatric: He has a normal mood and affect. His behavior is normal.   Results for orders placed in visit on 09/26/12  POCT CBC      Result Value Range   WBC 7.6  4.6 - 10.2 K/uL   Lymph, poc 2.5  0.6 - 3.4   POC LYMPH PERCENT 33.3  10 - 50 %L   MID (cbc) 0.6  0 - 0.9   POC MID % 8.0  0 - 12 %M   POC Granulocyte 4.5  2 - 6.9   Granulocyte percent 58.7  37 - 80 %G   RBC 4.91  4.69 - 6.13 M/uL   Hemoglobin 15.4  14.1 - 18.1 g/dL   HCT, POC 84.6  96.2 - 53.7 %   MCV 99.2 (*) 80 - 97 fL   MCH, POC 31.4 (*) 27 - 31.2 pg   MCHC 31.6 (*) 31.8 - 35.4 g/dL   RDW, POC 95.2     Platelet Count, POC 149  142 - 424 K/uL   MPV 9.6  0 - 99.8 fL  POCT GLYCOSYLATED HEMOGLOBIN (HGB A1C)      Result Value Range   Hemoglobin A1C 7.0    POCT URINALYSIS DIPSTICK      Result Value Range   Color, UA yellow     Clarity, UA clear     Glucose, UA 100     Bilirubin, UA neg     Ketones, UA neg     Spec Grav, UA 1.025     Blood, UA neg     pH, UA 6.5     Protein, UA neg     Urobilinogen, UA 0.2     Nitrite, UA neg     Leukocytes, UA Negative          Assessment & Plan:  Type II or unspecified type diabetes mellitus without  mention of complication, not stated as uncontrolled - Plan: EKG 12-Lead, POCT CBC, POCT glycosylated hemoglobin (Hb A1C), Comprehensive metabolic panel, POCT urinalysis dipstick, Microalbumin, urine, Lipid panel  Pure hypercholesterolemia - Plan: EKG 12-Lead, POCT CBC, Comprehensive metabolic panel, TSH, Lipid panel  Essential hypertension, benign - Plan: POCT CBC, Comprehensive metabolic panel, TSH, Lipid panel  Pain in finger, left  Burn of finger, left, first degree, initial encounter  Need for prophylactic vaccination with combined diphtheria-tetanus-pertussis (DTP) vaccine - Plan: Tdap vaccine greater than or equal to 7yo IM  Prostate cancer - Plan: PSA   1.  DMII: moderately controlled; obtain labs; refills provided.  No change in management at this time.  Recommend eye exam. 2.  Hypercholesterolemia: stable; obtain labs; refill provided. 3.  HTN: moderately controlled; obtain labs; continue current medications. 4. Pain in Finger L: New; secondary to burn.   5.  Burn of L finger first degree:  New.  S/p TDAP; rx for Augmentin provided; local wound care; RTC immediately for fever, increasing pain, swelling, drainage. 6.  Prostate cancer: stable; obtain PSA.  No recent urological follow-up.  7. Peripheral neuropathy: controlled with Gabapentin at current dose; refill provided. 8.  Allergic Rhinitis: stable; refills provided.  Meds ordered this encounter  Medications  . azelastine (ASTELIN) 137 MCG/SPRAY nasal spray    Sig: Place 2 sprays into the nose 2 (two) times daily. Use in each nostril  as directed    Dispense:  30 mL    Refill:  5  . carvedilol (COREG) 3.125 MG tablet    Sig: Take 1 tablet (3.125 mg total) by mouth 2 (two) times daily with a meal.    Dispense:  60 tablet    Refill:  5  . gabapentin (NEURONTIN) 300 MG capsule    Sig: Take 1-2 capsules (300-600 mg total) by mouth 3 (three) times daily.    Dispense:  180 capsule    Refill:  5  . glimepiride (AMARYL) 4 MG  tablet    Sig: Take 1 tablet (4 mg total) by mouth daily before breakfast.    Dispense:  30 tablet    Refill:  5  . lisinopril (PRINIVIL,ZESTRIL) 10 MG tablet    Sig: Take 1 tablet (10 mg total) by mouth daily.    Dispense:  30 tablet    Refill:  5  . metFORMIN (GLUCOPHAGE) 1000 MG tablet    Sig: Take 1 tablet (1,000 mg total) by mouth 2 (two) times daily with a meal.    Dispense:  60 tablet    Refill:  5  . omeprazole (PRILOSEC) 40 MG capsule    Sig: Take 1 capsule (40 mg total) by mouth daily.    Dispense:  30 capsule    Refill:  11  . simvastatin (ZOCOR) 40 MG tablet    Sig: Take 1 tablet (40 mg total) by mouth at bedtime.    Dispense:  30 tablet    Refill:  5    Due for office visit  . amoxicillin-clavulanate (AUGMENTIN) 875-125 MG per tablet    Sig: Take 1 tablet by mouth 2 (two) times daily.    Dispense:  20 tablet    Refill:  0

## 2012-09-26 NOTE — Patient Instructions (Addendum)
1.  Call to see when Dr. Perrin Maltese is working on a Friday.

## 2013-04-24 ENCOUNTER — Ambulatory Visit (INDEPENDENT_AMBULATORY_CARE_PROVIDER_SITE_OTHER): Payer: BC Managed Care – PPO | Admitting: Emergency Medicine

## 2013-04-24 ENCOUNTER — Other Ambulatory Visit: Payer: Self-pay | Admitting: Family Medicine

## 2013-04-24 ENCOUNTER — Ambulatory Visit: Payer: BC Managed Care – PPO

## 2013-04-24 VITALS — BP 134/72 | HR 77 | Temp 98.4°F | Resp 18 | Ht 70.0 in | Wt 243.0 lb

## 2013-04-24 DIAGNOSIS — S91309A Unspecified open wound, unspecified foot, initial encounter: Secondary | ICD-10-CM

## 2013-04-24 DIAGNOSIS — E119 Type 2 diabetes mellitus without complications: Secondary | ICD-10-CM

## 2013-04-24 DIAGNOSIS — E7800 Pure hypercholesterolemia, unspecified: Secondary | ICD-10-CM

## 2013-04-24 LAB — GLUCOSE, POCT (MANUAL RESULT ENTRY): POC Glucose: 125 mg/dl — AB (ref 70–99)

## 2013-04-24 LAB — POCT GLYCOSYLATED HEMOGLOBIN (HGB A1C): HEMOGLOBIN A1C: 7.5

## 2013-04-24 MED ORDER — MUPIROCIN 2 % EX OINT: TOPICAL_OINTMENT | CUTANEOUS | Status: DC

## 2013-04-24 MED ORDER — DOXYCYCLINE HYCLATE 100 MG PO CAPS: 100.0000 mg | ORAL_CAPSULE | Freq: Two times a day (BID) | ORAL | Status: DC

## 2013-04-24 NOTE — Progress Notes (Signed)
Subjective:   This chart was scribed for Remo Lipps A. Everlene Farrier, MD  by Forrestine Him, [facility] Scribe. This patient was seen in room 14 and the patient's care was started 12:07 PM.    Patient ID: [name], male    DOB: [birth year], 63 y.o.   MRN: [id number]  HPI  HPI Comments: [name] is a 63 y.o. male with multiple medical problems including DM, HTN, and Hyperlipidemia who presents to [facility] complaining of a gradual onset, gradually worsening, moderate erythematous painful sore to the top of the left foot that initially appeared about 2 weeks ago. He denies any recent injury or trauma, however, he states he recently purchased a new pair of black boots, which he feels may have been rubbing on the top of his feet. He states he has tried Neosporin and peroxide to the area with no noticeably improvement. At this time he denies any fever or chills. Pt says his sugars have been running well, but reports intermittent elevations in the 200 range secondary to his diet. He states he typically comes to [facility] for follow ups, last vist being 08/2012. At this time he has no other concerns or complaints.  Past Medical History  Diagnosis Date  . Diabetes mellitus without complication   . Hypertension   . Hyperlipidemia   . GERD (gastroesophageal reflux disease)   . Cancer     Prostate cancer age 89; Lupron treatments followed by prostatectomy.  . Myocardial infarction     age 72; no stenting.  . OSA (obstructive sleep apnea)     non-compliant with CPAP.   Past Surgical History  Procedure Laterality Date  . Cholecystectomy    . Prostate surgery     Allergies  Allergen Reactions  . Darvocet [Propoxyphene N-Acetaminophen] Hives   Prior to Admission medications   Medication Sig Start Date End Date Taking? Authorizing Provider  aspirin 81 MG tablet Take 81 mg by mouth daily.   Yes Historical Provider, MD  azelastine (ASTELIN) 137 MCG/SPRAY nasal  spray Place 2 sprays into the nose 2 (two) times daily. Use in each nostril as directed 09/26/12  Yes Wardell Honour, MD  carvedilol (COREG) 3.125 MG tablet Take 1 tablet (3.125 mg total) by mouth 2 (two) times daily with a meal. 09/26/12  Yes Wardell Honour, MD  cyclobenzaprine (FLEXERIL) 5 MG tablet 1 pill by mouth up to every 8 hours as needed. Start with one pill by mouth each bedtime as needed due to sedation 07/13/12  Yes Wendie Agreste, MD  gabapentin (NEURONTIN) 300 MG capsule Take 1-2 capsules (300-600 mg total) by mouth 3 (three) times daily. 09/26/12  Yes Wardell Honour, MD  glimepiride (AMARYL) 4 MG tablet Take 1 tablet (4 mg total) by mouth daily before breakfast. 09/26/12  Yes Wardell Honour, MD  ketoconazole (NIZORAL) 2 % cream Apply topically daily.   Yes Historical Provider, MD  lisinopril (PRINIVIL,ZESTRIL) 10 MG tablet Take 1 tablet (10 mg total) by mouth daily. 09/26/12  Yes Wardell Honour, MD  metFORMIN (GLUCOPHAGE) 1000 MG tablet Take 1 tablet (1,000 mg total) by mouth 2 (two) times daily with a meal. 09/26/12  Yes Wardell Honour, MD  omeprazole (PRILOSEC) 40 MG capsule Take 1 capsule (40 mg total) by mouth daily. 09/26/12  Yes Wardell Honour, MD  simvastatin (ZOCOR) 40 MG tablet Take 1 tablet (40 mg total) by mouth at bedtime. 09/26/12  Yes  Wardell Honour, MD  traMADol (ULTRAM) 50 MG tablet Take 1 tablet (50 mg total) by mouth every 6 (six) hours as needed for pain. 07/13/12  Yes Wendie Agreste, MD  amoxicillin-clavulanate (AUGMENTIN) 875-125 MG per tablet Take 1 tablet by mouth 2 (two) times daily. 09/26/12   Wardell Honour, MD    Review of Systems  Constitutional: Negative for fever and chills.  Skin: Positive for wound (Wound to top of left foot).    Triage Vitals: BP 134/72  Pulse 77  Temp(Src) 98.4 F (36.9 C) (Oral)  Resp 18  Ht 5\' 10"  (1.778 m)  Wt 243 lb (110.224 kg)  BMI 34.87 kg/m2  SpO2 97%   Objective:  Physical Exam Constitutional: well developed, well  nourished, no distress Head: normocephalic/atraumatic Eyes: EOMI/PERRL ENMT: mucous membranes moist Neck: supple, no meningeal signs CV: no murmur/rubs/gallops noted Lungs: clear to auscultation bilaterally Abd: soft, nontender GU: normal appearance Extremities: full ROM noted, pulses normal/equal; Left ankle and left toes are normal Neuro: awake/alert, no distress, appropriate for age, maex4, no lethargy is noted; Normal sensation to the bottom of left foot Skin: no rash/petechiae noted.  Color normal.  Warm; .7 x.7 cm ulcerated area to top of left foot surround by 2 cm of erythema  Psych: appropriate for age  [facility] reading (PRIMARY) by   Dr. Everlene Farrier is a circular calcification between the first and second metatarsals. Question vascular. Results for orders placed in visit on 04/24/13  GLUCOSE, POCT (MANUAL RESULT ENTRY)      Result Value Ref Range   POC Glucose 125 (*) 70 - 99 mg/dl  POCT GLYCOSYLATED HEMOGLOBIN (HGB A1C)      Result Value Ref Range   Hemoglobin A1C 7.5      Assessment & Plan:  Patient has an obvious cellulitis on the top of his foot. I suspect this started as a pressure ulcer from new shoes. He'll be treated with Bactroban and Doxy His diabetes is not at goal I advised her to work harder on his diet and recheck it in 3 months  **Disclaimer: This note was dictated with voice recognition software. Similar sounding words can inadvertently be transcribed and this note may contain transcription errors which may not have been corrected upon publication of note.**  I personally performed the services described in this documentation, which was scribed in my presence. The recorded information has been reviewed and is accurate.

## 2013-04-24 NOTE — Patient Instructions (Signed)
Cellulitis Cellulitis is an infection of the skin and the tissue beneath it. The infected area is usually red and tender. Cellulitis occurs most often in the arms and lower legs.  CAUSES  Cellulitis is caused by bacteria that enter the skin through cracks or cuts in the skin. The most common types of bacteria that cause cellulitis are Staphylococcus and Streptococcus. SYMPTOMS   Redness and warmth.  Swelling.  Tenderness or pain.  Fever. DIAGNOSIS  Your caregiver can usually determine what is wrong based on a physical exam. Blood tests may also be done. TREATMENT  Treatment usually involves taking an antibiotic medicine. HOME CARE INSTRUCTIONS   Take your antibiotics as directed. Finish them even if you start to feel better.  Keep the infected arm or leg elevated to reduce swelling.  Apply a warm cloth to the affected area up to 4 times per day to relieve pain.  Only take over-the-counter or prescription medicines for pain, discomfort, or fever as directed by your caregiver.  Keep all follow-up appointments as directed by your caregiver. SEEK MEDICAL CARE IF:   You notice red streaks coming from the infected area.  Your red area gets larger or turns dark in color.  Your bone or joint underneath the infected area becomes painful after the skin has healed.  Your infection returns in the same area or another area.  You notice a swollen bump in the infected area.  You develop new symptoms. SEEK IMMEDIATE MEDICAL CARE IF:   You have a fever.  You feel very sleepy.  You develop vomiting or diarrhea.  You have a general ill feeling (malaise) with muscle aches and pains. MAKE SURE YOU:   Understand these instructions.  Will watch your condition.  Will get help right away if you are not doing well or get worse. Document Released: 11/27/2004 Document Revised: 08/19/2011 Document Reviewed: 05/05/2011 [employer] Patient Information 2014 [employer].  

## 2013-04-27 LAB — WOUND CULTURE
GRAM STAIN: NONE SEEN
ORGANISM ID, BACTERIA: NO GROWTH

## 2013-05-29 ENCOUNTER — Ambulatory Visit (INDEPENDENT_AMBULATORY_CARE_PROVIDER_SITE_OTHER): Payer: BC Managed Care – PPO | Admitting: Family Medicine

## 2013-05-29 VITALS — BP 140/84 | HR 82 | Temp 97.9°F | Resp 16 | Ht 70.0 in | Wt 255.0 lb

## 2013-05-29 DIAGNOSIS — E119 Type 2 diabetes mellitus without complications: Secondary | ICD-10-CM

## 2013-05-29 DIAGNOSIS — E1165 Type 2 diabetes mellitus with hyperglycemia: Secondary | ICD-10-CM

## 2013-05-29 DIAGNOSIS — S91309A Unspecified open wound, unspecified foot, initial encounter: Secondary | ICD-10-CM

## 2013-05-29 DIAGNOSIS — IMO0001 Reserved for inherently not codable concepts without codable children: Secondary | ICD-10-CM

## 2013-05-29 DIAGNOSIS — E7800 Pure hypercholesterolemia, unspecified: Secondary | ICD-10-CM

## 2013-05-29 NOTE — Progress Notes (Signed)
Subjective: Patient is here for followup with going to the wound on his foot that has a little ulceration that refuses to heal. He has been about 5 or 6 weeks is Dr. Everlene Farrier saw him.  Objective: Surrounding tissue looks like it is been healing. There is a small central core about 5 mm in diameter there has an eschar adhered down in it. This was gently curetted out using a #3 curette. There was a small core for the lateral aspect of the cavity it went deeper. This was pulled out also. I could not tell if there is any foreign body in it.  Assessment: Wound and eschar left foot, debridement  Plan: Continue using the mupirocin ointment. I think this will continue to gradually heal on him. We should recheck it in about 2 weeks. If he gets worse looking at anytime he is to come in.

## 2013-05-29 NOTE — Patient Instructions (Signed)
If he gets worse looking at anytime please come in, otherwise plan to return in 2-3 weeks for a recheck. Ask for either a Dr. Everlene Farrier or Dr. Linna Darner to check if possible it since we have seen it before.  Keep it clean and covered and use the antibiotic ointment that you have on it.

## 2013-06-01 ENCOUNTER — Telehealth: Payer: Self-pay

## 2013-06-01 DIAGNOSIS — L97509 Non-pressure chronic ulcer of other part of unspecified foot with unspecified severity: Secondary | ICD-10-CM

## 2013-06-01 DIAGNOSIS — E119 Type 2 diabetes mellitus without complications: Secondary | ICD-10-CM

## 2013-06-01 NOTE — Telephone Encounter (Signed)
Pt would like to go see a podiatrist due to diabetic concerns and this foot ulceration. Pended referral please advise.

## 2013-06-01 NOTE — Telephone Encounter (Signed)
Pt daughter is calling requesting that patient get a referral to [facility] for diabetic foot check dr ziglar

## 2013-06-02 NOTE — Telephone Encounter (Signed)
Referral sent as requested

## 2013-06-02 NOTE — Telephone Encounter (Signed)
Please make the referral to foot center as requested

## 2013-06-25 ENCOUNTER — Other Ambulatory Visit: Payer: Self-pay | Admitting: Physician Assistant

## 2013-08-07 ENCOUNTER — Ambulatory Visit (INDEPENDENT_AMBULATORY_CARE_PROVIDER_SITE_OTHER): Payer: BC Managed Care – PPO | Admitting: Family Medicine

## 2013-08-07 VITALS — BP 150/86 | HR 77 | Temp 98.0°F | Resp 20 | Ht 69.25 in | Wt 246.0 lb

## 2013-08-07 DIAGNOSIS — W57XXXA Bitten or stung by nonvenomous insect and other nonvenomous arthropods, initial encounter: Principal | ICD-10-CM

## 2013-08-07 DIAGNOSIS — S30860A Insect bite (nonvenomous) of lower back and pelvis, initial encounter: Secondary | ICD-10-CM

## 2013-08-07 MED ORDER — DOXYCYCLINE HYCLATE 100 MG PO CAPS: 100.0000 mg | ORAL_CAPSULE | Freq: Two times a day (BID) | ORAL | Status: DC

## 2013-08-07 NOTE — Progress Notes (Signed)
Subjective: Patient has been working outdoors. He got a couple of ticks on him. The portal large enough under his left breast. He's gotten a little embedded in his left shoulder area. This looks almost like one of his many moles. Those are the only to be confined. He's not been febrile.  Objective: Cellulitis where the tick was removed from under his left breast with about a 3 CM area of erythema. The left shoulder has about 2 seems of erythema surrounding a tightly embedded tick. The tick was grasped using forceps at the base of the neck and it was pulled out with the head intact. He tolerated this well.  Assessment: Tick bites with local cellulitis, no evidence of tic borne diseases.  Plan: Doxycycline 1 mg twice a day for 10 days Hydrocortisone cream on the bites Return if worse

## 2013-08-07 NOTE — Patient Instructions (Addendum)
Take doxycycline one twice daily with breakfast and supper for tick bite infection  Use a little over-the-counter hydrocortisone cream twice daily on the bites  Return if worse

## 2013-08-08 ENCOUNTER — Telehealth: Payer: Self-pay

## 2013-08-08 MED ORDER — GABAPENTIN 300 MG PO CAPS: ORAL_CAPSULE | ORAL | Status: DC

## 2013-08-08 MED ORDER — METFORMIN HCL 1000 MG PO TABS: ORAL_TABLET | ORAL | Status: DC

## 2013-08-08 MED ORDER — GLIMEPIRIDE 4 MG PO TABS: 4.0000 mg | ORAL_TABLET | Freq: Every day | ORAL | Status: DC

## 2013-08-08 MED ORDER — AZELASTINE HCL 0.1 % NA SOLN: 2.0000 | Freq: Two times a day (BID) | NASAL | Status: DC

## 2013-08-08 NOTE — Telephone Encounter (Signed)
Pt was in to see Dr Linna Darner and had told the triage nurse that he needed refills, but all that pharm received was Abx for tick bite. Advised pt that note was in the visit that pt needed med refills, but I don't know that Dr Linna Darner was aware of that. Pt agreed that he didn't have any lab work or specifically talk about other meds. Pt stated that he will RTC in a couple of weeks for bloodwork and check-up, but needs 1 mos RFs of gabapentin, metformin, glimeparide and astelin NS. I am sending in these RFs for pt.

## 2013-09-02 ENCOUNTER — Other Ambulatory Visit: Payer: Self-pay | Admitting: Physician Assistant

## 2013-10-20 ENCOUNTER — Telehealth: Payer: Self-pay | Admitting: *Deleted

## 2013-10-20 NOTE — Telephone Encounter (Signed)
Left message for patient to call the ofc to schedule diabetes follow up with Dr. Linna Darner

## 2013-11-18 ENCOUNTER — Ambulatory Visit (INDEPENDENT_AMBULATORY_CARE_PROVIDER_SITE_OTHER): Payer: BC Managed Care – PPO | Admitting: Family Medicine

## 2013-11-18 ENCOUNTER — Other Ambulatory Visit: Payer: Self-pay | Admitting: Family Medicine

## 2013-11-18 ENCOUNTER — Telehealth: Payer: Self-pay | Admitting: Radiology

## 2013-11-18 VITALS — BP 134/78 | HR 81 | Temp 98.4°F | Resp 18 | Ht 69.5 in | Wt 237.0 lb

## 2013-11-18 DIAGNOSIS — R079 Chest pain, unspecified: Secondary | ICD-10-CM

## 2013-11-18 DIAGNOSIS — E11319 Type 2 diabetes mellitus with unspecified diabetic retinopathy without macular edema: Secondary | ICD-10-CM

## 2013-11-18 DIAGNOSIS — E09319 Drug or chemical induced diabetes mellitus with unspecified diabetic retinopathy without macular edema: Secondary | ICD-10-CM

## 2013-11-18 DIAGNOSIS — I10 Essential (primary) hypertension: Secondary | ICD-10-CM

## 2013-11-18 DIAGNOSIS — G4710 Hypersomnia, unspecified: Secondary | ICD-10-CM

## 2013-11-18 DIAGNOSIS — G4730 Sleep apnea, unspecified: Secondary | ICD-10-CM

## 2013-11-18 DIAGNOSIS — E785 Hyperlipidemia, unspecified: Secondary | ICD-10-CM

## 2013-11-18 DIAGNOSIS — E1339 Other specified diabetes mellitus with other diabetic ophthalmic complication: Secondary | ICD-10-CM

## 2013-11-18 LAB — COMPREHENSIVE METABOLIC PANEL
ALT: 32 U/L (ref 0–53)
AST: 33 U/L (ref 0–37)
Albumin: 4.4 g/dL (ref 3.5–5.2)
Alkaline Phosphatase: 60 U/L (ref 39–117)
BUN: 7 mg/dL (ref 6–23)
CO2: 25 mEq/L (ref 19–32)
Calcium: 9 mg/dL (ref 8.4–10.5)
Chloride: 104 mEq/L (ref 96–112)
Creat: 0.76 mg/dL (ref 0.50–1.35)
Glucose, Bld: 179 mg/dL — ABNORMAL HIGH (ref 70–99)
Potassium: 4.7 mEq/L (ref 3.5–5.3)
Sodium: 140 mEq/L (ref 135–145)
Total Bilirubin: 0.7 mg/dL (ref 0.2–1.2)
Total Protein: 7 g/dL (ref 6.0–8.3)

## 2013-11-18 LAB — LIPID PANEL
Cholesterol: 212 mg/dL — ABNORMAL HIGH (ref 0–200)
HDL: 56 mg/dL (ref >39)
LDL Cholesterol: 119 mg/dL — ABNORMAL HIGH (ref 0–99)
Total CHOL/HDL Ratio: 3.8 Ratio
Triglycerides: 183 mg/dL — ABNORMAL HIGH (ref <150)
VLDL: 37 mg/dL (ref 0–40)

## 2013-11-18 LAB — POCT CBC
Granulocyte percent: 60.2 %G (ref 37–80)
HCT, POC: 48.7 % (ref 43.5–53.7)
Hemoglobin: 16.1 g/dL (ref 14.1–18.1)
Lymph, poc: 2.5 (ref 0.6–3.4)
MCH, POC: 31.9 pg — AB (ref 27–31.2)
MCHC: 33.2 g/dL (ref 31.8–35.4)
MCV: 96.1 fL (ref 80–97)
MID (cbc): 0.3 (ref 0–0.9)
MPV: 7.5 fL (ref 0–99.8)
POC Granulocyte: 4.2 (ref 2–6.9)
POC LYMPH PERCENT: 35.2 %L (ref 10–50)
POC MID %: 4.6 %M (ref 0–12)
Platelet Count, POC: 161 10*3/uL (ref 142–424)
RBC: 5.06 M/uL (ref 4.69–6.13)
RDW, POC: 13.2 %
WBC: 7 10*3/uL (ref 4.6–10.2)

## 2013-11-18 LAB — GLUCOSE, POCT (MANUAL RESULT ENTRY): POC Glucose: 164 mg/dl — AB (ref 70–99)

## 2013-11-18 LAB — MICROALBUMIN, URINE: Microalb, Ur: 3.25 mg/dL — ABNORMAL HIGH (ref 0.00–1.89)

## 2013-11-18 LAB — POCT GLYCOSYLATED HEMOGLOBIN (HGB A1C): Hemoglobin A1C: 7.9

## 2013-11-18 MED ORDER — LISINOPRIL 10 MG PO TABS: 10.0000 mg | ORAL_TABLET | Freq: Every day | ORAL | Status: DC

## 2013-11-18 MED ORDER — METFORMIN HCL 1000 MG PO TABS: ORAL_TABLET | ORAL | Status: DC

## 2013-11-18 MED ORDER — SIMVASTATIN 40 MG PO TABS: 40.0000 mg | ORAL_TABLET | Freq: Every day | ORAL | Status: DC

## 2013-11-18 NOTE — Progress Notes (Signed)
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 He's exhausted. He's had diabetes for 8 years and about 5 years ago suffered a heart attack. He signed himself out of the hospital AMA. He never had any revascularization.  Currently he notes some lower extremity aching, stating that he walks miles a day at the site where he works.  Patient chews tobacco but does not smoke. He has a positive family history of cancer and notes that his family members all smoke.  Patient also has sleep apnea and wakes up approximately 14 times a night. He would like to get further evaluation. He has seen with bowel or pulmonary and tried the CPAP machine but this was unsuccessful. He's considering having surgery.  He denies any chest pain today (he had some chest pressure yesterday), nausea, diaphoresis or edema.  Objective: Patient appears somewhat discouraged but is alert and cooperative HEENT: Edentulous, normal TMs, no oral lesions seen, fundi normal. Neck: Supple, no adenopathy, no thyromegaly, no bruits Chest: Clear to auscultation Heart: Regular without gallop or murmur Abdomen: Soft nontender without HSM or mass Skin: No rashes Extremities: No edema with good distal pulses  EKG:  bifasicular block  Results for orders placed in visit on 11/18/13  POCT CBC      Result Value Ref Range   WBC 7.0  4.6 - 10.2 K/uL   Lymph, poc 2.5  0.6 - 3.4   POC LYMPH PERCENT 35.2  10 - 50 %L   MID (cbc) 0.3  0 - 0.9   POC MID % 4.6  0 - 12 %M   POC Granulocyte 4.2  2 - 6.9   Granulocyte percent 60.2  37 - 80 %G   RBC 5.06  4.69 - 6.13 M/uL   Hemoglobin 16.1  14.1 - 18.1 g/dL   HCT, POC 48.7  43.5 - 53.7 %   MCV 96.1  80 - 97 fL   MCH, POC 31.9 (*) 27 - 31.2 pg   MCHC 33.2  31.8 - 35.4 g/dL   RDW, POC 13.2     Platelet Count, POC 161  142 - 424 K/uL   MPV 7.5  0 - 99.8 fL  GLUCOSE, POCT (MANUAL RESULT ENTRY)      Result Value Ref Range   POC Glucose 164  (*) 70 - 99 mg/dl  POCT GLYCOSYLATED HEMOGLOBIN (HGB A1C)      Result Value Ref Range   Hemoglobin A1C 7.9     Chest pain, unspecified chest pain type - Plan: EKG 12-Lead  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

## 2013-11-18 NOTE — Telephone Encounter (Signed)
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 I dont see it listed, it may have been removed from his list at some point.  We can call it in to pharmacy [phone]

## 2013-11-18 NOTE — Patient Instructions (Signed)
[name], you have a significant heart problem. He needs to take the aspirin and these other medicines every day to prevent further damage to the heart. In addition I've written a letter for you so that you don't work more than 40 hours a week. He rests is just as important as you job.  He comes back in 3 months so we can look at the status began and make sure that your sugars are under better control. You do have a full life with diabetes a few take care of yourself.

## 2013-11-19 MED ORDER — GABAPENTIN 300 MG PO CAPS: 300.0000 mg | ORAL_CAPSULE | Freq: Three times a day (TID) | ORAL | Status: DC

## 2013-12-09 ENCOUNTER — Institutional Professional Consult (permissible substitution): Payer: Self-pay | Admitting: Pulmonary Disease

## 2014-03-27 ENCOUNTER — Other Ambulatory Visit: Payer: Self-pay | Admitting: Family Medicine

## 2014-05-05 ENCOUNTER — Ambulatory Visit (INDEPENDENT_AMBULATORY_CARE_PROVIDER_SITE_OTHER): Payer: BLUE CROSS/BLUE SHIELD | Admitting: Emergency Medicine

## 2014-05-05 ENCOUNTER — Ambulatory Visit (INDEPENDENT_AMBULATORY_CARE_PROVIDER_SITE_OTHER): Payer: BLUE CROSS/BLUE SHIELD

## 2014-05-05 VITALS — BP 144/80 | HR 83 | Temp 97.9°F | Resp 18 | Ht 69.5 in | Wt 246.2 lb

## 2014-05-05 DIAGNOSIS — E1110 Type 2 diabetes mellitus with ketoacidosis without coma: Secondary | ICD-10-CM

## 2014-05-05 DIAGNOSIS — E1310 Other specified diabetes mellitus with ketoacidosis without coma: Secondary | ICD-10-CM | POA: Diagnosis not present

## 2014-05-05 DIAGNOSIS — Z77090 Contact with and (suspected) exposure to asbestos: Secondary | ICD-10-CM

## 2014-05-05 DIAGNOSIS — G629 Polyneuropathy, unspecified: Secondary | ICD-10-CM | POA: Diagnosis not present

## 2014-05-05 LAB — COMPLETE METABOLIC PANEL WITH GFR
ALBUMIN: 4.2 g/dL (ref 3.5–5.2)
ALK PHOS: 79 U/L (ref 39–117)
ALT: 39 U/L (ref 0–53)
AST: 38 U/L — AB (ref 0–37)
BUN: 11 mg/dL (ref 6–23)
CO2: 25 mEq/L (ref 19–32)
Calcium: 9.1 mg/dL (ref 8.4–10.5)
Chloride: 104 mEq/L (ref 96–112)
Creat: 0.82 mg/dL (ref 0.50–1.35)
GFR, Est African American: >89 mL/min
GLUCOSE: 160 mg/dL — AB (ref 70–99)
POTASSIUM: 4.9 meq/L (ref 3.5–5.3)
Sodium: 141 mEq/L (ref 135–145)
Total Bilirubin: 0.8 mg/dL (ref 0.2–1.2)
Total Protein: 6.8 g/dL (ref 6.0–8.3)

## 2014-05-05 LAB — POCT CBC
Granulocyte percent: 59.3 %G (ref 37–80)
HCT, POC: 48.9 % (ref 43.5–53.7)
HEMOGLOBIN: 16 g/dL (ref 14.1–18.1)
LYMPH, POC: 2.6 (ref 0.6–3.4)
MCH: 31.5 pg — AB (ref 27–31.2)
MCHC: 32.7 g/dL (ref 31.8–35.4)
MCV: 96.3 fL (ref 80–97)
MID (CBC): 0.5 (ref 0–0.9)
MPV: 7.7 fL (ref 0–99.8)
PLATELET COUNT, POC: 161 10*3/uL (ref 142–424)
POC GRANULOCYTE: 4.6 (ref 2–6.9)
POC LYMPH PERCENT: 34.4 %L (ref 10–50)
POC MID %: 6.3 % (ref 0–12)
RBC: 5.08 M/uL (ref 4.69–6.13)
RDW, POC: 12.4 %
WBC: 7.7 10*3/uL (ref 4.6–10.2)

## 2014-05-05 LAB — GLUCOSE, POCT (MANUAL RESULT ENTRY): POC Glucose: 159 mg/dl — AB (ref 70–99)

## 2014-05-05 LAB — POCT GLYCOSYLATED HEMOGLOBIN (HGB A1C): Hemoglobin A1C: 9.1

## 2014-05-05 MED ORDER — OMEPRAZOLE 20 MG PO CPDR: 20.0000 mg | DELAYED_RELEASE_CAPSULE | Freq: Every day | ORAL | Status: DC

## 2014-05-05 MED ORDER — AZELASTINE HCL 0.1 % NA SOLN: 2.0000 | Freq: Two times a day (BID) | NASAL | Status: DC

## 2014-05-05 NOTE — Progress Notes (Addendum)
Subjective:    Patient ID: Clayton Ruddle., male    DOB: 1950/10/12, 64 y.o.   MRN: [id number] This chart was scribed for Arlyss Queen, MD by Zola Button, Medical Scribe. This patient was seen in room 5 and the patient's care was started at 11:25 AM.   HPI HPI Comments: Clayton Englert. is a 64 y.o. male with a hx of sleep apnea and DM with neuropathy who presents to the [facility] complaining of worsening nasal congestion for the last 2-3 months. Patient states that his sleep apnea has been worse than usual for the past 2-3 months and has been having difficulty breathing at night. He has not used any nasal sprays. Patient had been seen at [facility] for initial sleep study and was started on a breathing machine. He states it was not effective and then went to see another specialist, who recommended that he have surgery done. However, he did not have any surgery done.   Patient also reports having pain and weakness in his legs. He states he takes about 45 minutes to get out of bed every day due to the pain and that he almost fell twice in the past 3 weeks. He is still on medications for neuropathy, which he states helps with his legs. Patient notes that he will have severe pain if he doesn't take the medication. He notes hx of upper and lower slipped discs. 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 Patient reports driving about 4 hours a day for work and working about 10 hours a day. He has not had any nerve conduction studies done yet, but is willing to have one done.   He is thinking about applying for disabilities, but hopes to make it to the end of the year.   Review of Systems  HENT: Positive for congestion.   Respiratory: Positive for shortness of breath.   Musculoskeletal: Positive for myalgias.  Neurological: Positive for weakness.       Objective:   Physical Exam CONSTITUTIONAL: Well developed/well nourished HEAD: Normocephalic/atraumatic EYES:  EOM/PERRL ENMT: Mucous membranes moist. Significant bilateral nasal congestion. NECK: supple no meningeal signs SPINE: entire spine nontender CV: S1/S2 noted, no murmurs/rubs/gallops noted. RRR. LUNGS: Breath sounds symmetrical. CTAB. ABDOMEN: soft, nontender, no rebound or guarding GU: no cva tenderness NEURO: Pt is awake/alert, moves all extremitiesx4 EXTREMITIES: Decreased sensation in both feet. Decreased vibrational sensation in both feet. Abset DTR in both feet. SKIN: warm, color normal PSYCH: no abnormalities of mood noted [facility] reading (PRIMARY) by  Dr. Everlene Farrier there appears to be some atelectatic areas of the right diaphragm with previous granulomatous disease please comment  Results for orders placed or performed in visit on 05/05/14  POCT CBC  Result Value Ref Range   WBC 7.7 4.6 - 10.2 K/uL   Lymph, poc 2.6 0.6 - 3.4   POC LYMPH PERCENT 34.4 10 - 50 %L   MID (cbc) 0.5 0 - 0.9   POC MID % 6.3 0 - 12 %M   POC Granulocyte 4.6 2 - 6.9   Granulocyte percent 59.3 37 - 80 %G   RBC 5.08 4.69 - 6.13 M/uL   Hemoglobin 16.0 14.1 - 18.1 g/dL   HCT, POC 48.9 43.5 - 53.7 %   MCV 96.3 80 - 97 fL   MCH, POC 31.5 (A) 27 - 31.2 pg   MCHC 32.7 31.8 - 35.4 g/dL   RDW, POC 12.4 %  Platelet Count, POC 161 142 - 424 K/uL   MPV 7.7 0 - 99.8 fL  POCT glucose (manual entry)  Result Value Ref Range   POC Glucose 159 (A) 70 - 99 mg/dl  POCT glycosylated hemoglobin (Hb A1C)  Result Value Ref Range   Hemoglobin A1C 9.1         Assessment & Plan:  We'll proceed with CT chest. I advised the patient to go ahead and seek legal help if he wants to pursue disability. 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 He has diabetes with daily alcohol use at night. We'll do an EMG nerve conduction study to document the level of his neuropathy. He has significant neuropathy by physical exam.I personally performed the services described in this documentation, which was scribed in my presence. The  recorded information has been reviewed and is accurate. Sugar is not under control he was encouraged to work harder on diet. He also requested a refill on his Prilosec which I did. He has been buying this over-the-counter

## 2014-05-12 ENCOUNTER — Ambulatory Visit
Admission: RE | Admit: 2014-05-12 | Discharge: 2014-05-12 | Disposition: A | Discharge status: Home / Self Care | Payer: BLUE CROSS/BLUE SHIELD | Source: Ambulatory Visit | Attending: Emergency Medicine | Admitting: Emergency Medicine

## 2014-05-12 DIAGNOSIS — Z77090 Contact with and (suspected) exposure to asbestos: Secondary | ICD-10-CM

## 2014-05-14 ENCOUNTER — Other Ambulatory Visit: Payer: Self-pay

## 2014-05-14 DIAGNOSIS — I2584 Coronary atherosclerosis due to calcified coronary lesion: Principal | ICD-10-CM

## 2014-05-14 DIAGNOSIS — I2510 Atherosclerotic heart disease of native coronary artery without angina pectoris: Secondary | ICD-10-CM

## 2014-05-21 ENCOUNTER — Ambulatory Visit (INDEPENDENT_AMBULATORY_CARE_PROVIDER_SITE_OTHER): Payer: BLUE CROSS/BLUE SHIELD | Admitting: Family Medicine

## 2014-05-21 ENCOUNTER — Other Ambulatory Visit: Payer: Self-pay | Admitting: Family Medicine

## 2014-05-21 VITALS — BP 130/90 | HR 82 | Temp 97.8°F | Resp 18 | Ht 70.0 in | Wt 246.8 lb

## 2014-05-21 DIAGNOSIS — G629 Polyneuropathy, unspecified: Secondary | ICD-10-CM

## 2014-05-21 DIAGNOSIS — E785 Hyperlipidemia, unspecified: Secondary | ICD-10-CM | POA: Diagnosis not present

## 2014-05-21 DIAGNOSIS — R21 Rash and other nonspecific skin eruption: Secondary | ICD-10-CM

## 2014-05-21 DIAGNOSIS — I2510 Atherosclerotic heart disease of native coronary artery without angina pectoris: Secondary | ICD-10-CM | POA: Diagnosis not present

## 2014-05-21 DIAGNOSIS — K760 Fatty (change of) liver, not elsewhere classified: Secondary | ICD-10-CM

## 2014-05-21 DIAGNOSIS — R252 Cramp and spasm: Secondary | ICD-10-CM

## 2014-05-21 DIAGNOSIS — E1142 Type 2 diabetes mellitus with diabetic polyneuropathy: Secondary | ICD-10-CM

## 2014-05-21 DIAGNOSIS — G4733 Obstructive sleep apnea (adult) (pediatric): Secondary | ICD-10-CM

## 2014-05-21 DIAGNOSIS — I2584 Coronary atherosclerosis due to calcified coronary lesion: Secondary | ICD-10-CM

## 2014-05-21 DIAGNOSIS — IMO0002 Reserved for concepts with insufficient information to code with codable children: Secondary | ICD-10-CM

## 2014-05-21 DIAGNOSIS — E1165 Type 2 diabetes mellitus with hyperglycemia: Secondary | ICD-10-CM

## 2014-05-21 LAB — POCT SEDIMENTATION RATE: POCT SED RATE: 10 mm/hr (ref 0–22)

## 2014-05-21 LAB — POCT URINALYSIS DIPSTICK
Bilirubin, UA: NEGATIVE
Blood, UA: NEGATIVE
GLUCOSE UA: 500
Ketones, UA: NEGATIVE
LEUKOCYTES UA: NEGATIVE
Nitrite, UA: NEGATIVE
PH UA: 7.5
Spec Grav, UA: 1.015
UROBILINOGEN UA: 0.2

## 2014-05-21 LAB — POCT CBC
Granulocyte percent: 70.6 %G (ref 37–80)
HCT, POC: 49.2 % (ref 43.5–53.7)
Hemoglobin: 15.9 g/dL (ref 14.1–18.1)
LYMPH, POC: 2.3 (ref 0.6–3.4)
MCH: 30.9 pg (ref 27–31.2)
MCHC: 32.4 g/dL (ref 31.8–35.4)
MCV: 95.5 fL (ref 80–97)
MID (cbc): 0.4 (ref 0–0.9)
MPV: 7.9 fL (ref 0–99.8)
POC Granulocyte: 6.4 (ref 2–6.9)
POC LYMPH PERCENT: 25.4 %L (ref 10–50)
POC MID %: 4 % (ref 0–12)
Platelet Count, POC: 153 10*3/uL (ref 142–424)
RBC: 5.15 M/uL (ref 4.69–6.13)
RDW, POC: 12.3 %
WBC: 9 10*3/uL (ref 4.6–10.2)

## 2014-05-21 LAB — MAGNESIUM: MAGNESIUM: 2.3 mg/dL (ref 1.5–2.5)

## 2014-05-21 LAB — COMPREHENSIVE METABOLIC PANEL
ALK PHOS: 80 U/L (ref 39–117)
ALT: 39 U/L (ref 0–53)
AST: 49 U/L — ABNORMAL HIGH (ref 0–37)
Albumin: 4.3 g/dL (ref 3.5–5.2)
BUN: 16 mg/dL (ref 6–23)
CHLORIDE: 99 meq/L (ref 96–112)
CO2: 21 meq/L (ref 19–32)
Calcium: 9.1 mg/dL (ref 8.4–10.5)
Creat: 0.72 mg/dL (ref 0.50–1.35)
Glucose, Bld: 199 mg/dL — ABNORMAL HIGH (ref 70–99)
Potassium: 5.3 mEq/L (ref 3.5–5.3)
Sodium: 134 mEq/L — ABNORMAL LOW (ref 135–145)
TOTAL PROTEIN: 7.2 g/dL (ref 6.0–8.3)
Total Bilirubin: 0.6 mg/dL (ref 0.2–1.2)

## 2014-05-21 LAB — CK: CK TOTAL: 108 U/L (ref 7–232)

## 2014-05-21 LAB — POCT UA - MICROSCOPIC ONLY
BACTERIA, U MICROSCOPIC: NEGATIVE
Casts, Ur, LPF, POC: NEGATIVE
Crystals, Ur, HPF, POC: NEGATIVE
MUCUS UA: NEGATIVE
RBC, urine, microscopic: NEGATIVE
WBC, Ur, HPF, POC: NEGATIVE
YEAST UA: NEGATIVE

## 2014-05-21 LAB — C-REACTIVE PROTEIN

## 2014-05-21 LAB — PHOSPHORUS: Phosphorus: 3 mg/dL (ref 2.3–4.6)

## 2014-05-21 LAB — RPR

## 2014-05-21 LAB — POCT SKIN KOH: Skin KOH, POC: NEGATIVE

## 2014-05-21 MED ORDER — GABAPENTIN 300 MG PO CAPS: 600.0000 mg | ORAL_CAPSULE | Freq: Three times a day (TID) | ORAL | Status: DC

## 2014-05-21 MED ORDER — CYCLOBENZAPRINE HCL 10 MG PO TABS: 10.0000 mg | ORAL_TABLET | Freq: Every day | ORAL | Status: DC

## 2014-05-21 NOTE — Patient Instructions (Signed)
Muscle Cramps and Spasms Muscle cramps and spasms occur when a muscle or muscles tighten and you have no control over this tightening (involuntary muscle contraction). They are a common problem and can develop in any muscle. The most common place is in the calf muscles of the leg. Both muscle cramps and muscle spasms are involuntary muscle contractions, but they also have differences:   Muscle cramps are sporadic and painful. They may last a few seconds to a quarter of an hour. Muscle cramps are often more forceful and last longer than muscle spasms.  Muscle spasms may or may not be painful. They may also last just a few seconds or much longer. CAUSES  It is uncommon for cramps or spasms to be due to a serious underlying problem. In many cases, the cause of cramps or spasms is unknown. Some common causes are:   Overexertion.   Overuse from repetitive motions (doing the same thing over and over).   Remaining in a certain position for a long period of time.   Improper preparation, form, or technique while performing a sport or activity.   Dehydration.   Injury.   Side effects of some medicines.   Abnormally low levels of the salts and ions in your blood (electrolytes), especially potassium and calcium. This could happen if you are taking water pills (diuretics) or you are pregnant.  Some underlying medical problems can make it more likely to develop cramps or spasms. These include, but are not limited to:   Diabetes.   Parkinson disease.   Hormone disorders, such as thyroid problems.   Alcohol abuse.   Diseases specific to muscles, joints, and bones.   Blood vessel disease where not enough blood is getting to the muscles.  HOME CARE INSTRUCTIONS   Stay well hydrated. Drink enough water and fluids to keep your urine clear or pale yellow.  It may be helpful to massage, stretch, and relax the affected muscle.  For tight or tense muscles, use a warm towel, heating  pad, or hot shower water directed to the affected area.  If you are sore or have pain after a cramp or spasm, applying ice to the affected area may relieve discomfort.  Put ice in a plastic bag.  Place a towel between your skin and the bag.  Leave the ice on for 15-20 minutes, 03-04 times a day.  Medicines used to treat a known cause of cramps or spasms may help reduce their frequency or severity. Only take over-the-counter or prescription medicines as directed by your caregiver. SEEK MEDICAL CARE IF:  Your cramps or spasms get more severe, more frequent, or do not improve over time.  MAKE SURE YOU:   Understand these instructions.  Will watch your condition.  Will get help right away if you are not doing well or get worse. Document Released: 08/09/2001 Document Revised: 06/14/2012 Document Reviewed: 02/04/2012 [facility] Patient Information 2015 [place], [place]. This information is not intended to replace advice given to you by your health care provider. Make sure you discuss any questions you have with your health care provider.   Diabetic Neuropathy Diabetic neuropathy is a nerve disease or nerve damage that is caused by diabetes mellitus. About half of all people with diabetes mellitus have some form of nerve damage. Nerve damage is more common in those who have had diabetes mellitus for many years and who generally have not had good control of their blood sugar (glucose) level. Diabetic neuropathy is a common complication of diabetes  mellitus. There are three more common types of diabetic neuropathy and a fourth type that is less common and less understood:   Peripheral neuropathy--This is the most common type of diabetic neuropathy. It causes damage to the nerves of the feet and legs first and then eventually the hands and arms.The damage affects the ability to sense touch.  Autonomic neuropathy--This type causes damage to the autonomic nervous system, which controls the following  functions:  Heartbeat.  Body temperature.  Blood pressure.  Urination.  Digestion.  Sweating.  Sexual function.  Focal neuropathy--Focal neuropathy can be painful and unpredictable and occurs most often in older adults with diabetes mellitus. It involves a specific nerve or one area and often comes on suddenly. It usually does not cause long-term problems.  Radiculoplexus neuropathy-- Sometimes called lumbosacral radiculoplexus neuropathy, radiculoplexus neuropathy affects the nerves of the thighs, hips, buttocks, or legs. It is more common in people with type 2 diabetes mellitus and in older men. It is characterized by debilitating pain, weakness, and atrophy, usually in the thigh muscles. CAUSES  The cause of peripheral, autonomic, and focal neuropathies is diabetes mellitus that is uncontrolled and high glucose levels. The cause of radiculoplexus neuropathy is unknown. However, it is thought to be caused by inflammation related to uncontrolled glucose levels. SIGNS AND SYMPTOMS  Peripheral Neuropathy Peripheral neuropathy develops slowly over time. When the nerves of the feet and legs no longer work there may be:   Burning, stabbing, or aching pain in the legs or feet.  Inability to feel pressure or pain in your feet. This can lead to:  Thick calluses over pressure areas.  Pressure sores.  Ulcers.  Foot deformities.  Reduced ability to feel temperature changes.  Muscle weakness. Autonomic Neuropathy The symptoms of autonomic neuropathy vary depending on which nerves are affected. Symptoms may include:  Problems with digestion, such as:  Feeling sick to your stomach (nausea).  Vomiting.  Bloating.  Constipation.  Diarrhea.  Abdominal pain.  Difficulty with urination. This occurs if you lose your ability to sense when your bladder is full. Problems include:  Urine leakage (incontinence).  Inability to empty your bladder completely (retention).  Rapid or  irregular heartbeat (palpitations).  Blood pressure drops when you stand up (orthostatic hypotension). When you stand up you may feel:  Dizzy.  Weak.  Faint.  In men, inability to attain and maintain an erection.  In women, vaginal dryness and problems with decreased sexual desire and arousal.  Problems with body temperature regulation.  Increased or decreased sweating. Focal Neuropathy  Abnormal eye movements or abnormal alignment of both eyes.  Weakness in the wrist.  Foot drop. This results in an inability to lift the foot properly and abnormal walking or foot movement.  Paralysis on one side of your face (Bell palsy).  Chest or abdominal pain. Radiculoplexus Neuropathy  Sudden, severe pain in your hip, thigh, or buttocks.  Weakness and wasting of thigh muscles.  Difficulty rising from a seated position.  Abdominal swelling.  Unexplained weight loss (usually more than 10 lb [4.5 kg]). DIAGNOSIS  Peripheral Neuropathy Your senses may be tested. Sensory function testing can be done with:  A light touch using a monofilament.  A vibration with tuning fork.  A sharp sensation with a pin prick. Other tests that can help diagnose neuropathy are:  Nerve conduction velocity. This test checks the transmission of an electrical current through a nerve.  Electromyography. This shows how muscles respond to electrical signals transmitted by nearby  nerves.  Quantitative sensory testing. This is used to assess how your nerves respond to vibrations and changes in temperature. Autonomic Neuropathy Diagnosis is often based on reported symptoms. Tell your health care provider if you experience:   Dizziness.   Constipation.   Diarrhea.   Inappropriate urination or inability to urinate.   Inability to get or maintain an erection.  Tests that may be done include:   Electrocardiography or Holter monitor. These are tests that can help show problems with the heart  rate or heart rhythm.   An X-ray exam may be done. Focal Neuropathy Diagnosis is made based on your symptoms and what your health care provider finds during your exam. Other tests may be done. They may include:  Nerve conduction velocities. This checks the transmission of electrical current through a nerve.  Electromyography. This shows how muscles respond to electrical signals transmitted by nearby nerves.  Quantitative sensory testing. This test is used to assess how your nerves respond to vibration and changes in temperature. Radiculoplexus Neuropathy  Often the first thing is to eliminate any other issue or problems that might be the cause, as there is no stick test for diagnosis.  X-ray exam of your spine and lumbar region.  Spinal tap to rule out cancer.  MRI to rule out other lesions. TREATMENT  Once nerve damage occurs, it cannot be reversed. The goal of treatment is to keep the disease or nerve damage from getting worse and affecting more nerve fibers. Controlling your blood glucose level is the key. Most people with radiculoplexus neuropathy see at least a partial improvement over time. You will need to keep your blood glucose and HbA1c levels in the target range determined by your health care provider. Things that help control blood glucose levels include:   Blood glucose monitoring.   Meal planning.   Physical activity.   Diabetes medicine.  Over time, maintaining lower blood glucose levels helps lessen symptoms. Sometimes, prescription pain medicine is needed. HOME CARE INSTRUCTIONS:  Do not smoke.  Keep your blood glucose level in the range that you and your health care provider have determined acceptable for you.  Keep your blood pressure level in the range that you and your health care provider have determined acceptable for you.  Eat a well-balanced diet.  Be active every day.  Check your feet every day. SEEK MEDICAL CARE IF:   You have burning,  stabbing, or aching pain in the legs or feet.  You are unable to feel pressure or pain in your feet.  You develop problems with digestion such as:  Nausea.  Vomiting.  Bloating.  Constipation.  Diarrhea.  Abdominal pain.  You have difficulty with urination, such as:  Incontinence.  Retention.  You have palpitations.  You develop orthostatic hypotension. When you stand up you may feel:  Dizzy.  Weak.  Faint.  You cannot attain and maintain an erection (in men).  You have vaginal dryness and problems with decreased sexual desire and arousal (in women).  You have severe pain in your thighs, legs, or buttocks.  You have unexplained weight loss. Document Released: 04/28/2001 Document Revised: 12/08/2012 Document Reviewed: 07/29/2012 [facility] Patient Information 2015 [place], [place]. This information is not intended to replace advice given to you by your health care provider. Make sure you discuss any questions you have with your health care provider.  Neuropathic Pain We often think that pain has a physical cause. If we get rid of the cause, the pain should go  away. Nerves themselves can also cause pain. It is called neuropathic pain, which means nerve abnormality. It may be difficult for the patients who have it and for the treating caregivers. Pain is usually described as acute (short-lived) or chronic (long-lasting). Acute pain is related to the physical sensations caused by an injury. It can last from a few seconds to many weeks, but it usually goes away when normal healing occurs. Chronic pain lasts beyond the typical healing time. With neuropathic pain, the nerve fibers themselves may be damaged or injured. They then send incorrect signals to other pain centers. The pain you feel is real, but the cause is not easy to find.  CAUSES  Chronic pain can result from diseases, such as diabetes and shingles (an infection related to chickenpox), or from trauma, surgery, or  amputation. It can also happen without any known injury or disease. The nerves are sending pain messages, even though there is no identifiable cause for such messages.   Other common causes of neuropathy include diabetes, phantom limb pain, or Regional Pain Syndrome (RPS).  As with all forms of chronic back pain, if neuropathy is not correctly treated, there can be a number of associated problems that lead to a downward cycle for the patient. These include depression, sleeplessness, feelings of fear and anxiety, limited social interaction and inability to do normal daily activities or work.  The most dramatic and mysterious example of neuropathic pain is called "phantom limb syndrome." This occurs when an arm or a leg has been removed because of illness or injury. The brain still gets pain messages from the nerves that originally carried impulses from the missing limb. These nerves now seem to misfire and cause troubling pain.  Neuropathic pain often seems to have no cause. It responds poorly to standard pain treatment. Neuropathic pain can occur after:  Shingles (herpes zoster virus infection).  A lasting burning sensation of the skin, caused usually by injury to a peripheral nerve.  Peripheral neuropathy which is widespread nerve damage, often caused by diabetes or alcoholism.  Phantom limb pain following an amputation.  Facial nerve problems (trigeminal neuralgia).  Multiple sclerosis.  Reflex sympathetic dystrophy.  Pain which comes with cancer and cancer chemotherapy.  Entrapment neuropathy such as when pressure is put on a nerve such as in carpal tunnel syndrome.  Back, leg, and hip problems (sciatica).  Spine or back surgery.  HIV Infection or AIDS where nerves are infected by viruses. Your caregiver can explain items in the above list which may apply to you. SYMPTOMS  Characteristics of neuropathic pain are:  Severe, sharp, electric shock-like, shooting, lightening-like,  knife-like.  Pins and needles sensation.  Deep burning, deep cold, or deep ache.  Persistent numbness, tingling, or weakness.  Pain resulting from light touch or other stimulus that would not usually cause pain.  Increased sensitivity to something that would normally cause pain, such as a pinprick. Pain may persist for months or years following the healing of damaged tissues. When this happens, pain signals no longer sound an alarm about current injuries or injuries about to happen. Instead, the alarm system itself is not working correctly.  Neuropathic pain may get worse instead of better over time. For some people, it can lead to serious disability. It is important to be aware that severe injury in a limb can occur without a proper, protective pain response.Burns, cuts, and other injuries may go unnoticed. Without proper treatment, these injuries can become infected or lead to further disability.  Take any injury seriously, and consult your caregiver for treatment. DIAGNOSIS  When you have a pain with no known cause, your caregiver will probably ask some specific questions:   Do you have any other conditions, such as diabetes, shingles, multiple sclerosis, or HIV infection?  How would you describe your pain? (Neuropathic pain is often described as shooting, stabbing, burning, or searing.)  Is your pain worse at any time of the day? (Neuropathic pain is usually worse at night.)  Does the pain seem to follow a certain physical pathway?  Does the pain come from an area that has missing or injured nerves? (An example would be phantom limb pain.)  Is the pain triggered by minor things such as rubbing against the sheets at night? These questions often help define the type of pain involved. Once your caregiver knows what is happening, treatment can begin. Anticonvulsant, antidepressant drugs, and various pain relievers seem to work in some cases. If another condition, such as diabetes is  involved, better management of that disorder may relieve the neuropathic pain.  TREATMENT  Neuropathic pain is frequently long-lasting and tends not to respond to treatment with narcotic type pain medication. It may respond well to other drugs such as antiseizure and antidepressant medications. Usually, neuropathic problems do not completely go away, but partial improvement is often possible with proper treatment. Your caregivers have large numbers of medications available to treat you. Do not be discouraged if you do not get immediate relief. Sometimes different medications or a combination of medications will be tried before you receive the results you are hoping for. See your caregiver if you have pain that seems to be coming from nowhere and does not go away. Help is available.  SEEK IMMEDIATE MEDICAL CARE IF:   There is a sudden change in the quality of your pain, especially if the change is on only one side of the body.  You notice changes of the skin, such as redness, black or purple discoloration, swelling, or an ulcer.  You cannot move the affected limbs. Document Released: 11/15/2003 Document Revised: 05/12/2011 Document Reviewed: 11/15/2003 [facility] Patient Information 2015 [place], [place]. This information is not intended to replace advice given to you by your health care provider. Make sure you discuss any questions you have with your health care provider.  Potassium Content of Foods Potassium is a mineral found in many foods and drinks. It helps keep fluids and minerals balanced in your body and affects how steadily your heart beats. Potassium also helps control your blood pressure and keep your muscles and nervous system healthy. Certain health conditions and medicines may change the balance of potassium in your body. When this happens, you can help balance your level of potassium through the foods that you do or do not eat. Your health care provider or dietitian may recommend an amount  of potassium that you should have each day. The following lists of foods provide the amount of potassium (in parentheses) per serving in each item. HIGH IN POTASSIUM  The following foods and beverages have 200 mg or more of potassium per serving:  Apricots, 2 raw or 5 dry (200 mg).  Artichoke, 1 medium (345 mg).  Avocado, raw,  each (245 mg).  Banana, 1 medium (425 mg).  Beans, lima, or baked beans, canned,  cup (280 mg).  Beans, white, canned,  cup (595 mg).  Beef roast, 3 oz (320 mg).  Beef, ground, 3 oz (270 mg).  Beets, raw or cooked,  cup (  260 mg).  Bran muffin, 2 oz (300 mg).  Broccoli,  cup (230 mg).  Brussels sprouts,  cup (250 mg).  Cantaloupe,  cup (215 mg).  Cereal, 100% bran,  cup (200-400 mg).  Cheeseburger, single, fast food, 1 each (225-400 mg).  Chicken, 3 oz (220 mg).  Clams, canned, 3 oz (535 mg).  Crab, 3 oz (225 mg).  Dates, 5 each (270 mg).  Dried beans and peas,  cup (300-475 mg).  Figs, dried, 2 each (260 mg).  Fish: halibut, tuna, cod, snapper, 3 oz (480 mg).  Fish: salmon, haddock, swordfish, perch, 3 oz (300 mg).  Fish, tuna, canned 3 oz (200 mg).  [place] fries, fast food, 3 oz (470 mg).  Granola with fruit and nuts,  cup (200 mg).  Grapefruit juice,  cup (200 mg).  Greens, beet,  cup (655 mg).  Honeydew melon,  cup (200 mg).  Kale, raw, 1 cup (300 mg).  Kiwi, 1 medium (240 mg).  Kohlrabi, rutabaga, parsnips,  cup (280 mg).  Lentils,  cup (365 mg).  Mango, 1 each (325 mg).  Milk, chocolate, 1 cup (420 mg).  Milk: nonfat, low-fat, whole, buttermilk, 1 cup (350-380 mg).  Molasses, 1 Tbsp (295 mg).  Mushrooms,  cup (280) mg.  Nectarine, 1 each (275 mg).  Nuts: almonds, peanuts, hazelnuts, [place], cashew, mixed, 1 oz (200 mg).  Nuts, pistachios, 1 oz (295 mg).  Orange, 1 each (240 mg).  Orange juice,  cup (235 mg).  Papaya, medium,  fruit (390 mg).  Peanut butter, chunky, 2 Tbsp (240  mg).  Peanut butter, smooth, 2 Tbsp (210 mg).  Pear, 1 medium (200 mg).  Pomegranate, 1 whole (400 mg).  Pomegranate juice,  cup (215 mg).  Pork, 3 oz (350 mg).  Potato chips, salted, 1 oz (465 mg).  Potato, baked with skin, 1 medium (925 mg).  Potatoes, boiled,  cup (255 mg).  Potatoes, mashed,  cup (330 mg).  Prune juice,  cup (370 mg).  Prunes, 5 each (305 mg).  Pudding, chocolate,  cup (230 mg).  Pumpkin, canned,  cup (250 mg).  Raisins, seedless,  cup (270 mg).  Seeds, sunflower or pumpkin, 1 oz (240 mg).  Soy milk, 1 cup (300 mg).  Spinach,  cup (420 mg).  Spinach, canned,  cup (370 mg).  Sweet potato, baked with skin, 1 medium (450 mg).  Swiss chard,  cup (480 mg).  Tomato or vegetable juice,  cup (275 mg).  Tomato sauce or puree,  cup (400-550 mg).  Tomato, raw, 1 medium (290 mg).  Tomatoes, canned,  cup (200-300 mg).  [place], 3 oz (250 mg).  Wheat germ, 1 oz (250 mg).  Winter squash,  cup (250 mg).  Yogurt, plain or fruited, 6 oz (260-435 mg).  Zucchini,  cup (220 mg). MODERATE IN POTASSIUM The following foods and beverages have 50-200 mg of potassium per serving:  Apple, 1 each (150 mg).  Apple juice,  cup (150 mg).  Applesauce,  cup (90 mg).  Apricot nectar,  cup (140 mg).  Asparagus, small spears,  cup or 6 spears (155 mg).  Bagel, cinnamon raisin, 1 each (130 mg).  Bagel, egg or plain, 4 in., 1 each (70 mg).  Beans, green,  cup (90 mg).  Beans, yellow,  cup (190 mg).  Beer, regular, 12 oz (100 mg).  Beets, canned,  cup (125 mg).  Blackberries,  cup (115 mg).  Blueberries,  cup (60 mg).  Bread, whole wheat, 1 slice (70 mg).  Broccoli, raw,  cup (145 mg).  Cabbage,  cup (150 mg).  Carrots, cooked or raw,  cup (180 mg).  Cauliflower, raw,  cup (150 mg).  Celery, raw,  cup (155 mg).  Cereal, bran flakes, cup (120-150 mg).  Cheese, cottage,  cup (110 mg).  Cherries, 10 each  (150 mg).  Chocolate, 1 oz bar (165 mg).  Coffee, brewed 6 oz (90 mg).  Corn,  cup or 1 ear (195 mg).  Cucumbers,  cup (80 mg).  Egg, large, 1 each (60 mg).  Eggplant,  cup (60 mg).  Endive, raw, cup (80 mg).  English muffin, 1 each (65 mg).  Fish, orange roughy, 3 oz (150 mg).  Frankfurter, beef or pork, 1 each (75 mg).  Fruit cocktail,  cup (115 mg).  Grape juice,  cup (170 mg).  Grapefruit,  fruit (175 mg).  Grapes,  cup (155 mg).  Greens: kale, turnip, collard,  cup (110-150 mg).  Ice cream or frozen yogurt, chocolate,  cup (175 mg).  Ice cream or frozen yogurt, vanilla,  cup (120-150 mg).  Lemons, limes, 1 each (80 mg).  Lettuce, all types, 1 cup (100 mg).  Mixed vegetables,  cup (150 mg).  Mushrooms, raw,  cup (110 mg).  Nuts: walnuts, pecans, or macadamia, 1 oz (125 mg).  Oatmeal,  cup (80 mg).  Okra,  cup (110 mg).  Onions, raw,  cup (120 mg).  Peach, 1 each (185 mg).  Peaches, canned,  cup (120 mg).  Pears, canned,  cup (120 mg).  Peas, green, frozen,  cup (90 mg).  Peppers, green,  cup (130 mg).  Peppers, red,  cup (160 mg).  Pineapple juice,  cup (165 mg).  Pineapple, fresh or canned,  cup (100 mg).  Plums, 1 each (105 mg).  Pudding, vanilla,  cup (150 mg).  Raspberries,  cup (90 mg).  Rhubarb,  cup (115 mg).  Rice, wild,  cup (80 mg).  Shrimp, 3 oz (155 mg).  Spinach, raw, 1 cup (170 mg).  Strawberries,  cup (125 mg).  Summer squash  cup (175-200 mg).  Swiss chard, raw, 1 cup (135 mg).  Tangerines, 1 each (140 mg).  Tea, brewed, 6 oz (65 mg).  Turnips,  cup (140 mg).  Watermelon,  cup (85 mg).  Wine, red, table, 5 oz (180 mg).  Wine, white, table, 5 oz (100 mg). LOW IN POTASSIUM The following foods and beverages have less than 50 mg of potassium per serving.  Bread, white, 1 slice (30 mg).  Carbonated beverages, 12 oz (less than 5 mg).  Cheese, 1 oz (20-30  mg).  Cranberries,  cup (45 mg).  Cranberry juice cocktail,  cup (20 mg).  Fats and oils, 1 Tbsp (less than 5 mg).  Hummus, 1 Tbsp (32 mg).  Nectar: papaya, mango, or pear,  cup (35 mg).  Rice, white or brown,  cup (50 mg).  Spaghetti or macaroni,  cup cooked (30 mg).  Tortilla, flour or corn, 1 each (50 mg).  Waffle, 4 in., 1 each (50 mg).  Water chestnuts,  cup (40 mg). Document Released: 10/01/2004 Document Revised: 02/22/2013 Document Reviewed: 01/14/2013 [facility] Patient Information 2015 [place], [place]. This information is not intended to replace advice given to you by your health care provider. Make sure you discuss any questions you have with your health care provider.

## 2014-05-22 ENCOUNTER — Encounter: Payer: Self-pay | Admitting: Family Medicine

## 2014-05-22 NOTE — Progress Notes (Signed)
Subjective:  This chart was scribed for Delman Cheadle, MD, by Starleen Arms, ED Scribe. This patient was seen in room Rm 4 and the patient's care was started at 12:41 PM.   Patient ID: Clayton Ruddle., male    DOB: 09/06/1950, 64 y.o.   MRN: [id number] Chief Complaint  Patient presents with  . cramps in legs    x2 days; started in lower leg and radiates to back; states his legs are very weak  . cramps in arms    states its in both arms    HPI HPI Comments: Clayton Dayrit. is a 64 y.o. male who was seen in [facility] 2 weeks ago and was seen by Dr. Everlene Farrier for sinus congestion.  History of sleep apnea and DM with polyneuropathy.  When patient was here last at that visit, he was reporting bilateral lower extremity weakness, difficulty getting of bed, and several recent falls due to worsening neuropathy for which he has been taking gabapentin and helps moderately.  Also has a history of DDD throughout his back and continues to do a lot of heavy lifting at work.  He has a history of OSA but reports CPAP was not effective.  He was recommended to have surgery but never had this done.  He has also had neurologic studies to confirm his neuropathy but on his exams was noted to have decreased sensation bilaterally including light touch and vibration with absent DTR's.  Hemoglobin A1C at that point was 9.1.  He also has daily alcohol use which could cause a vitamin deficiency that exacerbates his neuropathy.  He was ordered to have an EMG/MCD.  He was referred to neurology and has not been scheduled yet by [facility].  Patient also did have a chest CT done last week that showed minimal pulmonary findings but did show CAD and hepatic steatosis for which he has been referred to cardiology.  He presents to [facility] complaining of an episode of painful BLE cramping onset 2 days ago as the patient awoke that lasted approximately 15 minutes.  He also complains of some upper arm cramping onset 2 days that has resolved.    He states that yesterday he began potassium to take 400 mg potasium and 400 mg magnesium tablets, stopped drinking beer, milk, and carbonated sodas, and increased his water intake.  Since this time he reports an improvement in his symptoms.  He reports his urine has been slightly darker than normal recently.  Patient reports recent increased difficulty passing stool onset after TURP. Patient denies other medication changes including herbal supplements or vitamins.      He reports frequent leg pain as a result of standing for long periods of time at work.  Patient reports he has recently been taking 2 gabapentin twice daily without drowsiness due to demands of standing for long periods at his job.   He also complains of a moderate exacerbation of chronic lower back pain.    Past Medical History  Diagnosis Date  . Diabetes mellitus without complication   . Hypertension   . Hyperlipidemia   . GERD (gastroesophageal reflux disease)   . Cancer     Prostate cancer age 50; Lupron treatments followed by prostatectomy.  . Myocardial infarction     age 49; no stenting.  . OSA (obstructive sleep apnea)     non-compliant with CPAP.   Current Outpatient Prescriptions on File Prior to Visit  Medication Sig Dispense Refill  . aspirin 81 MG  tablet Take 81 mg by mouth daily.    Marland Kitchen azelastine (ASTELIN) 0.1 % nasal spray Place 2 sprays into both nostrils 2 (two) times daily. 30 mL 0  . azelastine (ASTELIN) 0.1 % nasal spray Place 2 sprays into both nostrils 2 (two) times daily. Use in each nostril as directed 30 mL 11  . lisinopril (PRINIVIL,ZESTRIL) 10 MG tablet Take 1 tablet (10 mg total) by mouth daily. 30 tablet 11  . metFORMIN (GLUCOPHAGE) 1000 MG tablet TAKE 1 TABLET BY MOUTH TWO TIMES A DAY. 60 tablet 11  . omeprazole (PRILOSEC) 20 MG capsule Take 1 capsule (20 mg total) by mouth daily. 30 capsule 11   No current facility-administered medications on file prior to visit.   Allergies  Allergen  Reactions  . Darvocet [Propoxyphene N-Acetaminophen] Hives     Review of Systems  Musculoskeletal: Positive for myalgias and back pain.       Objective:  BP 130/90 mmHg  Pulse 82  Temp(Src) 97.8 F (36.6 C) (Oral)  Resp 18  Ht 5\' 10"  (1.778 m)  Wt 246 lb 12.8 oz (111.948 kg)  BMI 35.41 kg/m2  SpO2 96%  Physical Exam  Constitutional: He is oriented to person, place, and time. He appears well-developed and well-nourished. No distress.  HENT:  Head: Normocephalic and atraumatic.  Eyes: Conjunctivae and EOM are normal.  Neck: Neck supple. No tracheal deviation present.  Cardiovascular: Normal rate and regular rhythm.   Normal S1/S2  Pulmonary/Chest: Effort normal. No respiratory distress.  Good air movement.  Lungs clear throughout.   Musculoskeletal: Normal range of motion.  Neurological: He is alert and oriented to person, place, and time.  Skin: Skin is warm and dry.  Psychiatric: He has a normal mood and affect. His behavior is normal.  Nursing note and vitals reviewed.  Results for orders placed or performed in visit on 05/21/14  RPR  Result Value Ref Range   RPR Ser Ql NON REAC NON REAC  Comprehensive metabolic panel  Result Value Ref Range   Sodium 134 (L) 135 - 145 mEq/L   Potassium 5.3 3.5 - 5.3 mEq/L   Chloride 99 96 - 112 mEq/L   CO2 21 19 - 32 mEq/L   Glucose, Bld 199 (H) 70 - 99 mg/dL   BUN 16 6 - 23 mg/dL   Creat 0.72 0.50 - 1.35 mg/dL   Total Bilirubin 0.6 0.2 - 1.2 mg/dL   Alkaline Phosphatase 80 39 - 117 U/L   AST 49 (H) 0 - 37 U/L   ALT 39 0 - 53 U/L   Total Protein 7.2 6.0 - 8.3 g/dL   Albumin 4.3 3.5 - 5.2 g/dL   Calcium 9.1 8.4 - 10.5 mg/dL  Magnesium  Result Value Ref Range   Magnesium 2.3 1.5 - 2.5 mg/dL  Phosphorus  Result Value Ref Range   Phosphorus 3.0 2.3 - 4.6 mg/dL  C-reactive protein  Result Value Ref Range   CRP <0.5 <0.60 mg/dL  CK  Result Value Ref Range   Total CK 108 7 - 232 U/L  POCT CBC  Result Value Ref Range    WBC 9.0 4.6 - 10.2 K/uL   Lymph, poc 2.3 0.6 - 3.4   POC LYMPH PERCENT 25.4 10 - 50 %L   MID (cbc) 0.4 0 - 0.9   POC MID % 4.0 0 - 12 %M   POC Granulocyte 6.4 2 - 6.9   Granulocyte percent 70.6 37 - 80 %G   RBC 5.15  4.69 - 6.13 M/uL   Hemoglobin 15.9 14.1 - 18.1 g/dL   HCT, POC 49.2 43.5 - 53.7 %   MCV 95.5 80 - 97 fL   MCH, POC 30.9 27 - 31.2 pg   MCHC 32.4 31.8 - 35.4 g/dL   RDW, POC 12.3 %   Platelet Count, POC 153 142 - 424 K/uL   MPV 7.9 0 - 99.8 fL  POCT SEDIMENTATION RATE  Result Value Ref Range   POCT SED RATE 10 0 - 22 mm/hr  POCT UA - Microscopic Only  Result Value Ref Range   WBC, Ur, HPF, POC neg    RBC, urine, microscopic neg    Bacteria, U Microscopic neg    Mucus, UA neg    Epithelial cells, urine per micros 0-2    Crystals, Ur, HPF, POC neg    Casts, Ur, LPF, POC neg    Yeast, UA neg   POCT Skin KOH  Result Value Ref Range   Skin KOH, POC Negative   POCT urinalysis dipstick  Result Value Ref Range   Color, UA yellow    Clarity, UA clear    Glucose, UA 500    Bilirubin, UA neg    Ketones, UA neg    Spec Grav, UA 1.015    Blood, UA neg    pH, UA 7.5    Protein, UA trace    Urobilinogen, UA 0.2    Nitrite, UA neg    Leukocytes, UA Negative          Assessment & Plan:  1:15 PM Patient advised to stop taking the potassium supplements.  He may continue to take magnesium supplements unless he begins to experience diarrhea.  Patient should continue to drink plenty of water.  Will order labs.  Advised patient of plan to address his current muscle complaints and wait to address his cholesterol level due to potential for muscle cramping side effect. Type 2 diabetes mellitus with diabetic polyneuropathy - Plan: POCT urinalysis dipstick  Neuropathy - Plan: POCT urinalysis dipstick - pt using gabapentin 2 tabs bid - reluctant to increase any further due to poss of sedation so will defer option to increase to 2 tabs tid to patient preference - has neuro  referral for NCV/EMG P - made 2 wks ago - pt has not yet been contacted to sched.  Likely due to uncontrolled DM but rapid progression in arms concerning so referred to ensure no other secondary or complicating etiology.  OSA (obstructive sleep apnea) - Plan: POCT urinalysis dipstick - failed cpap treatment and sinus/oropharynx surgery sounds atypical and fraught w/ complications so OSA remains untreated and pt constantly fatigued with worsening medical problems - hopefully neurology c/s will be able to readdress poss treatment options w/ pt. . . . .  Type 2 diabetes mellitus with hyperglycemia - Plan: POCT urinalysis dipstick - uncontrolled w/ a1c 9.1 2 wks prior. Has to stop drinking beer which pt is willing to consider  Uncontrolled diabetes mellitus - Plan: POCT urinalysis dipstick  Muscle cramps - Plan: POCT CBC, POCT SEDIMENTATION RATE, POCT UA - Microscopic Only, Folate, RPR, Vitamin B12, Comprehensive metabolic panel, Magnesium, Phosphorus, C-reactive protein, POCT urinalysis dipstick, CK, CANCELED: CK total and CKMB (cardiac) - cont mag 400 mg qhs but stop otc K supp - pt has never had low K and reviewed life-threatening complications from K over-suplimentation. Increase water, try qhs flexeril, cont exercise.  Rash and nonspecific skin eruption - Plan: POCT Skin KOH, POCT urinalysis  dipstick - scaly hyperpimented/ecchymotic macules on leg - 1 isolated 2 cm dm lesion seen - unknown etiology - watchful waiting  Hepatic steatosis - reassuring that nml lfts prev but seen on chest CT recently - work on diet/exercise - reviewed w/ pt in detail.  Hyperlipidemia LDL goal <70 - pt was prev on zocor but d/c'd many mos ago - pt does not really remember why or if he had sig myalgias w/ it - pt and notes unclear if he was on zocor with last lipid panel 6 mos prior but has not been on statin for last sev mos at least.  Reviewed w/ pt that lipid panel is uncontrolled and he will def need statin therapy -  however, will hold off on initiating until current myalgias resolve as do not want current symptoms to be attributed to statin or worsened by it.  Coronary artery calcification - was referred to cardiology prior as seen on chest CT - has not been scheduled yet.  Meds ordered this encounter  Medications  . gabapentin (NEURONTIN) 300 MG capsule    Sig: Take 2 capsules (600 mg total) by mouth 3 (three) times daily.    Dispense:  180 capsule    Refill:  2  . cyclobenzaprine (FLEXERIL) 10 MG tablet    Sig: Take 1 tablet (10 mg total) by mouth at bedtime.    Dispense:  30 tablet    Refill:  0    I personally performed the services described in this documentation, which was scribed in my presence. The recorded information has been reviewed and considered, and addended by me as needed.  Delman Cheadle, MD MPH

## 2014-05-23 LAB — FOLATE: Folate: 19.2 ng/mL

## 2014-05-23 LAB — VITAMIN B12: Vitamin B-12: 342 pg/mL (ref 211–911)

## 2014-05-23 LAB — TSH: TSH: 2.005 u[IU]/mL (ref 0.350–4.500)

## 2014-05-25 ENCOUNTER — Encounter: Payer: Self-pay | Admitting: Family Medicine

## 2014-05-26 ENCOUNTER — Ambulatory Visit (INDEPENDENT_AMBULATORY_CARE_PROVIDER_SITE_OTHER): Payer: BLUE CROSS/BLUE SHIELD | Admitting: Diagnostic Neuroimaging

## 2014-05-26 ENCOUNTER — Encounter: Payer: Self-pay | Admitting: Diagnostic Neuroimaging

## 2014-05-26 VITALS — BP 168/89 | HR 83 | Ht 69.0 in | Wt 242.2 lb

## 2014-05-26 DIAGNOSIS — M542 Cervicalgia: Secondary | ICD-10-CM

## 2014-05-26 DIAGNOSIS — M5416 Radiculopathy, lumbar region: Secondary | ICD-10-CM | POA: Diagnosis not present

## 2014-05-26 DIAGNOSIS — E1142 Type 2 diabetes mellitus with diabetic polyneuropathy: Secondary | ICD-10-CM

## 2014-05-26 DIAGNOSIS — G621 Alcoholic polyneuropathy: Secondary | ICD-10-CM | POA: Diagnosis not present

## 2014-05-26 MED ORDER — DULOXETINE HCL 30 MG PO CPEP: 30.0000 mg | ORAL_CAPSULE | Freq: Every day | ORAL | Status: DC

## 2014-05-26 MED ORDER — GABAPENTIN 300 MG PO CAPS: 900.0000 mg | ORAL_CAPSULE | Freq: Three times a day (TID) | ORAL | Status: DC

## 2014-05-26 NOTE — Progress Notes (Signed)
[facility]  PATIENT: [name] DOB: 06-26-1950  REFERRING CLINICIAN: Daub HISTORY FROM: patient  REASON FOR VISIT: new consult    HISTORICAL  CHIEF COMPLAINT:  Chief Complaint  Patient presents with  . New Evaluation    diabetic neuropathy     HISTORY OF PRESENT ILLNESS:   64 year old right-handed male with diabetes, hypercholesterolemia, hypertension, sleep apnea, prostate cancer, heart attack, cervical and lumbar spine degenerative disease, obesity, here for evaluation of numbness, pain, cramps in lower extremity's.  Patient was diagnosed with diabetes 9 years ago. He has struggled with his medical problems. On 5-6 years ago he began to notice onset of pins and needles, fire, burning, painful sensation in his toes and feet. This is spread up his legs. Over the last 3-4 years he is been having cramps, muscle weakness proximally in his lower extremity's. Also having some cramps in his arms. He has neck pain, back pain, lower back pain. He was diagnosed with sleep apnea 15-20 years ago, but could not tolerate CPAP therapy.  Patient also taking 6-8 beers per day, for many years. He is trying to cut down recently. He uses chewing tobacco on a daily basis.  Patient has been on gabapentin for several years, currently taking 900 mg twice a day with mild relief of symptoms.   REVIEW OF SYSTEMS: Full 14 system review of systems performed and notable only for fatigue chest pain hearing loss trouble swallowing shortness of breath wheezing snoring blurred vision numbness weakness difficulty swallowing sleepiness not asleep decreased energy cramps aching muscles joint pain moles.  ALLERGIES: Allergies  Allergen Reactions  . Darvocet [Propoxyphene N-Acetaminophen] Hives    HOME MEDICATIONS: Outpatient Prescriptions Prior to Visit  Medication Sig Dispense Refill  . aspirin 81 MG tablet Take 81 mg by mouth daily.    Marland Kitchen azelastine (ASTELIN) 0.1 % nasal spray  Place 2 sprays into both nostrils 2 (two) times daily. Use in each nostril as directed 30 mL 11  . cyclobenzaprine (FLEXERIL) 10 MG tablet Take 1 tablet (10 mg total) by mouth at bedtime. 30 tablet 0  . lisinopril (PRINIVIL,ZESTRIL) 10 MG tablet Take 1 tablet (10 mg total) by mouth daily. 30 tablet 11  . metFORMIN (GLUCOPHAGE) 1000 MG tablet TAKE 1 TABLET BY MOUTH TWO TIMES A DAY. 60 tablet 11  . omeprazole (PRILOSEC) 20 MG capsule Take 1 capsule (20 mg total) by mouth daily. 30 capsule 11  . azelastine (ASTELIN) 0.1 % nasal spray Place 2 sprays into both nostrils 2 (two) times daily. 30 mL 0  . gabapentin (NEURONTIN) 300 MG capsule Take 2 capsules (600 mg total) by mouth 3 (three) times daily. (Patient taking differently: Take 900 mg by mouth 2 (two) times daily. ) 180 capsule 2   No facility-administered medications prior to visit.    PAST MEDICAL HISTORY: Past Medical History  Diagnosis Date  . Diabetes mellitus without complication   . Hypertension   . Hyperlipidemia   . GERD (gastroesophageal reflux disease)   . Cancer     Prostate cancer age 81; Lupron treatments followed by prostatectomy.  . Myocardial infarction     age 77; no stenting.  . OSA (obstructive sleep apnea)     non-compliant with CPAP.    PAST SURGICAL HISTORY: Past Surgical History  Procedure Laterality Date  . Cholecystectomy    . Prostate surgery      FAMILY HISTORY: Family History  Problem Relation Age of Onset  . Cancer Mother  leukemia  . Diabetes Mother   . Cancer Father 64    lung cancer with mets  . Heart disease Maternal Grandmother   . Cancer Maternal Grandfather   . Heart disease Paternal Grandmother   . Cancer Paternal Grandfather     SOCIAL HISTORY:  History   Social History  . Marital Status: Single    Spouse Name: N/A  . Number of Children: 2  . Years of Education: 11   Occupational History  . Not on file.   Social History Main Topics  . Smoking status: Never Smoker     . Smokeless tobacco: Current User    Types: Chew  . Alcohol Use: 0.0 oz/week     Comment: social  . Drug Use: No  . Sexual Activity: Not Currently   Other Topics Concern  . Not on file   Social History Narrative   Marital status: divorced     Children: 1      Employment:        Tobacco: none; chew tobacco      Alcohol: beers on weekends      Exercise: none     PHYSICAL EXAM  Filed Vitals:   05/26/14 0847  BP: 168/89  Pulse: 83  Height: 5\' 9"  (1.753 m)  Weight: 242 lb 3.2 oz (109.861 kg)    Body mass index is 35.75 kg/(m^2).   Visual Acuity Screening   Right eye Left eye Both eyes  Without correction: 20/50 20/40   With correction:       No flowsheet data found.  GENERAL EXAM: Patient is in no distress; well developed, nourished and groomed; neck is supple  CARDIOVASCULAR: Regular rate and rhythm, no murmurs, no carotid bruits  NEUROLOGIC: MENTAL STATUS: awake, alert, oriented to person, place and time, recent and remote memory intact, normal attention and concentration, language fluent, comprehension intact, naming intact, fund of knowledge appropriate CRANIAL NERVE: no papilledema on fundoscopic exam, pupils equal and reactive to light, visual fields full to confrontation, extraocular muscles intact, no nystagmus, facial sensation and strength symmetric, hearing intact, palate elevates symmetrically, uvula midline, shoulder shrug symmetric, tongue midline. MOTOR: normal bulk and tone, full strength in the BUE, BLE; EXCEPT BILATERAL HF 4 (LIMITED BY PAIN) SENSORY: normal and symmetric to light touch and proprioception; VIB < 5 SEC AT TOES; DECR PP AND TEMP IN FEET AND LEFT HAND COORDINATION: finger-nose-finger, fine finger movements normal REFLEXES: BUE TRACE; BLE 0; MUTE TOES GAIT/STATION: ANTALGIC GAIT; DIFF WITH TOE AND TANDEM; HEEL GAIT UNSTEADY. Romberg is negative    DIAGNOSTIC DATA (LABS, IMAGING, TESTING) - I reviewed patient records, labs, notes,  testing and imaging myself where available.  Lab Results  Component Value Date   WBC 9.0 05/21/2014   HGB 15.9 05/21/2014   HCT 49.2 05/21/2014   MCV 95.5 05/21/2014      Component Value Date/Time   NA 134* 05/21/2014 1341   K 5.3 05/21/2014 1341   CL 99 05/21/2014 1341   CO2 21 05/21/2014 1341   GLUCOSE 199* 05/21/2014 1341   BUN 16 05/21/2014 1341   CREATININE 0.72 05/21/2014 1341   CALCIUM 9.1 05/21/2014 1341   PROT 7.2 05/21/2014 1341   ALBUMIN 4.3 05/21/2014 1341   AST 49* 05/21/2014 1341   ALT 39 05/21/2014 1341   ALKPHOS 80 05/21/2014 1341   BILITOT 0.6 05/21/2014 1341   GFRNONAA >89 05/05/2014 1156   GFRAA >89 05/05/2014 1156   Lab Results  Component Value Date   CHOL  212* 11/18/2013   HDL 56 11/18/2013   LDLCALC 119* 11/18/2013   TRIG 183* 11/18/2013   CHOLHDL 3.8 11/18/2013   Lab Results  Component Value Date   HGBA1C 9.1 05/05/2014   Lab Results  Component Value Date   VITAMINB12 342 05/21/2014   Lab Results  Component Value Date   TSH 2.005 05/21/2014   07/13/12 LUMBAR XRAY [I reviewed images myself and agree with interpretation. -VRP]  1. Mild to moderate multilevel lumbar spine DDD. 2. Mild (<25%) age indeterminate height loss of the L2 and L3 vertebral bodies. Correlation for point tenderness at these locations is recommended.     ASSESSMENT AND PLAN  64 y.o. year old male here with hypertension, diabetes, hypercholesteremia, obesity, sleep apnea, prostate cancer, alcohol abuse, here with lower extremity cramps, pain, numbness, tingling, burning sensation. This is likely due to combination of peripheral neuropathy (secondary to diabetes and alcohol abuse) as well as lumbar radiculopathy/degenerative disease.   Dx:  Diabetic polyneuropathy associated with type 2 diabetes mellitus  Alcoholic peripheral neuropathy  Neck pain  Lumbar radiculopathy    PLAN: - I long time discussing his risk factors for neuropathy and generalized  pain. He is at high risk for heart attack and stroke. Advised him to work on improving his nutrition, physical activity, diabetes, and reduce his alcohol use gradually. I cautioned him about alcohol withdrawal symptoms. - We'll increase gabapentin to 900 mg 3 times a day - Start duloxetine 30 mg daily; may increase to 60 mg after 2-4 weeks if necessary - Start compounded neuropathy cream - I considered MRI cervical and lumbar spine, however given his other medical comorbidities, he would not be a good surgical candidate at this time. Also the combination of neuropathy with polyradiculopathy may not be amenable to good surgical results. Advised him to improve on his general health status over the next 6-12 months, and then we may reconsider neuroimaging studies if necessary at that time.  No orders of the defined types were placed in this encounter.    Meds ordered this encounter  Medications  . gabapentin (NEURONTIN) 300 MG capsule    Sig: Take 3 capsules (900 mg total) by mouth 3 (three) times daily.    Dispense:  270 capsule    Refill:  12  . DULoxetine (CYMBALTA) 30 MG capsule    Sig: Take 1 capsule (30 mg total) by mouth daily.    Dispense:  30 capsule    Refill:  6    Return in about 3 months (around 08/26/2014).    Penni Bombard, MD 8/88/9169, 4:50 AM Certified in Neurology, Neurophysiology and Neuroimaging  [facility] [address], [place], [place] [address] [phone]

## 2014-05-26 NOTE — Patient Instructions (Signed)
Increase gabapentin to 900mg  three times per day.  Start duloxetine 30mg  daily.  Try neuropathy cream.  Follow up with PCP re: BP, diabetes, high cholesterol.

## 2014-07-18 ENCOUNTER — Ambulatory Visit (INDEPENDENT_AMBULATORY_CARE_PROVIDER_SITE_OTHER): Payer: BLUE CROSS/BLUE SHIELD | Admitting: Cardiology

## 2014-07-18 ENCOUNTER — Encounter: Payer: Self-pay | Admitting: Cardiology

## 2014-07-18 VITALS — BP 122/70 | HR 99 | Ht 71.0 in | Wt 243.8 lb

## 2014-07-18 DIAGNOSIS — G4733 Obstructive sleep apnea (adult) (pediatric): Secondary | ICD-10-CM | POA: Diagnosis not present

## 2014-07-18 DIAGNOSIS — G629 Polyneuropathy, unspecified: Secondary | ICD-10-CM | POA: Diagnosis not present

## 2014-07-18 DIAGNOSIS — R0600 Dyspnea, unspecified: Secondary | ICD-10-CM

## 2014-07-18 DIAGNOSIS — I452 Bifascicular block: Secondary | ICD-10-CM

## 2014-07-18 DIAGNOSIS — R0609 Other forms of dyspnea: Secondary | ICD-10-CM | POA: Diagnosis not present

## 2014-07-18 NOTE — Patient Instructions (Signed)
Medication Instructions:  Your physician recommends that you continue on your current medications as directed. Please refer to the Current Medication list given to you today.  Labwork: none  Testing/Procedures: Your physician has requested that you have a lexiscan myoview. For further information please visit [email or web address]. Please follow instruction sheet, as given.  Follow-Up: As needed     

## 2014-07-18 NOTE — Progress Notes (Signed)
Cardiology Office Note   Date:  07/18/2014   ID:  Clayton Ruddle., DOB 21-Nov-1950, MRN [id number]  PCP:  Clayton Reason, MD  Cardiologist: Darlin Coco MD  No chief complaint on file.     History of Present Illness: Clayton Dion. is a 64 y.o. male who presents for evaluation of chest discomfort and shortness of breath.  He is being seen at the request of Dr. Ruben [name]  The patient does not have any history of known ischemic heart disease.  He states that 7-10 years ago he had what might have been a "light heart attack" and went to the emergency room but signed out AMA because of a long wait in the emergency room.  He gives a history of exertional chest discomfort.  He recalls shoveling snow last winter and having to stop and rest because of chest tightness.  There is occasional left arm radiation.  The patient does not have any history of known congestive heart failure.  He sleeps on one pillow.  No peripheral edema.  The patient does not smoke cigarettes but he does chew tobacco.  He also drinks 6-8 beers per day.  He has a history of diabetic peripheral neuropathy alcohol may also be contributing to his neuropathy he is attempting to cut back on alcohol consumption.  The patient has a history of sleep apnea but states that he is unable to wear a CPAP device.  The patient has had a lot of muscle cramps.  He has seen neurology who feel that these are related to his neuropathy.  The patient had a CT scan of the chest looking for possible asbestos exposure because of his occupational history.  No asbestosis was found but he was found to have coronary artery calcification on his CT scan.  He has a chronically abnormal EKG with bifascicular block. Family history is positive for cancer and heart disease His personal medical history is positive for prostate cancer at age 21.  He had a prostatectomy by Dr. Karsten Ro.  Postoperatively he has lost letter control. As noted, the patient is  diabetic.  He is not on a careful diabetic diet.    Past Medical History  Diagnosis Date  . Diabetes mellitus without complication   . Hypertension   . Hyperlipidemia   . GERD (gastroesophageal reflux disease)   . Cancer     Prostate cancer age 45; Lupron treatments followed by prostatectomy.  . Myocardial infarction     age 80; no stenting.  . OSA (obstructive sleep apnea)     non-compliant with CPAP.    Past Surgical History  Procedure Laterality Date  . Cholecystectomy    . Prostate surgery       Current Outpatient Prescriptions  Medication Sig Dispense Refill  . aspirin 81 MG tablet Take 81 mg by mouth daily.    Marland Kitchen azelastine (ASTELIN) 0.1 % nasal spray Place 2 sprays into both nostrils 2 (two) times daily. Use in each nostril as directed 30 mL 11  . cyclobenzaprine (FLEXERIL) 10 MG tablet Take 1 tablet (10 mg total) by mouth at bedtime. 30 tablet 0  . DULoxetine (CYMBALTA) 30 MG capsule Take 1 capsule (30 mg total) by mouth daily. 30 capsule 6  . gabapentin (NEURONTIN) 300 MG capsule Take 3 capsules (900 mg total) by mouth 3 (three) times daily. 270 capsule 12  . lisinopril (PRINIVIL,ZESTRIL) 10 MG tablet Take 1 tablet (10 mg total) by mouth daily. 30 tablet 11  .  metFORMIN (GLUCOPHAGE) 1000 MG tablet TAKE 1 TABLET BY MOUTH TWO TIMES A DAY. 60 tablet 11  . omeprazole (PRILOSEC) 20 MG capsule Take 1 capsule (20 mg total) by mouth daily. 30 capsule 11  . vitamin B-12 (CYANOCOBALAMIN) 1000 MCG tablet Take 1,000 mcg by mouth daily.     No current facility-administered medications for this visit.    Allergies:   Darvocet    Social History:  The patient  reports that he has never smoked. His smokeless tobacco use includes Chew. He reports that he drinks alcohol. He reports that he does not use illicit drugs.   Family History:  The patient's family history includes Cancer in his maternal grandfather, mother, and paternal grandfather; Cancer (age of onset: 25) in his father;  Diabetes in his mother; Heart disease in his maternal grandmother and paternal grandmother.    ROS:  Please see the history of present illness.   Otherwise, review of systems are positive for none.   All other systems are reviewed and negative.    PHYSICAL EXAM: VS:  BP 122/70 mmHg  Pulse 99  Ht 5\' 11"  (1.803 m)  Wt 243 lb 12.8 oz (110.587 kg)  BMI 34.02 kg/m2 , BMI Body mass index is 34.02 kg/(m^2). GEN: Well nourished, well developed, in no acute distress HEENT: normal Neck: no JVD, carotid bruits, or masses Cardiac: RRR; no murmurs, rubs, or gallops,no edema  Respiratory:  clear to auscultation bilaterally, normal work of breathing GI: soft, nontender, nondistended, + BS MS: no deformity or atrophy Skin: warm and dry, no rash Neuro:  Strength and sensation are intact Psych: euthymic mood, full affect   EKG:  EKG is ordered today. The ekg ordered today demonstrates normal sinus rhythm at 99 bpm.  Right bundle branch block with left anterior fascicular block.  Cannot rule out septal infarct.  Since last tracing of 11/18/13, no significant change except heart rate is faster today   Recent Labs: 05/21/2014: ALT 39; BUN 16; Creatinine 0.72; Hemoglobin 15.9; Magnesium 2.3; Potassium 5.3; Sodium 134*; TSH 2.005    Lipid Panel    Component Value Date/Time   CHOL 212* 11/18/2013 0925   TRIG 183* 11/18/2013 0925   HDL 56 11/18/2013 0925   CHOLHDL 3.8 11/18/2013 0925   VLDL 37 11/18/2013 0925   LDLCALC 119* 11/18/2013 0925      Wt Readings from Last 3 Encounters:  07/18/14 243 lb 12.8 oz (110.587 kg)  05/26/14 242 lb 3.2 oz (109.861 kg)  05/21/14 246 lb 12.8 oz (111.948 kg)         ASSESSMENT AND PLAN:  1. Exertional chest pain.  Coronary artery calcification seen on CT scan. 2.  Severe diabetic neuropathy 3.  Bifascicular block 4.  History of obstructive sleep apnea 5.  Hypercholesterolemia 6.  Past history of prostate cancer at age 61.  Postoperative urinary  incontinence 7.  Essential hypertension  Disposition: We will have the patient return for a Lexiscan Myoview stress test to rule out significant ischemic heart disease. The patient has severe disabling peripheral neuropathy and leg cramps.  In addition to good diabetic control, it is very important for him to stop his heavy beer ingestion.  He is going to make an effort in that regard. The patient will continue his current medication.  Current medicines are reviewed at length with the patient today.  The patient does not have concerns regarding medicines.  The following changes have been made:  no change  Labs/ tests ordered today include:  Orders Placed This Encounter  Procedures  . Myocardial Perfusion Imaging  . EKG 12-Lead    Many thanks for the opportunity to see this pleasant gentleman.  We will be in touch regarding the results of his Lexi scan Myoview stress test.  Return here when necessary  Signed, Darlin Coco MD 07/18/2014 4:38 PM    [facility] [address], [place], [place]  [address] Phone: [phone]; Fax: [phone]

## 2014-08-02 ENCOUNTER — Telehealth (HOSPITAL_COMMUNITY): Payer: Self-pay | Admitting: *Deleted

## 2014-08-02 NOTE — Telephone Encounter (Signed)
Left message on voicemail in reference to upcoming appointment scheduled on 08/02/14 at 8:15 am with detailed instructions given per Myocardial Perfusion Study Information Sheet for the test. Phone number given for call back for any questions. Hubbard Robinson, RN

## 2014-08-04 ENCOUNTER — Ambulatory Visit (HOSPITAL_COMMUNITY): Payer: BLUE CROSS/BLUE SHIELD | Attending: Internal Medicine

## 2014-08-04 DIAGNOSIS — R0609 Other forms of dyspnea: Secondary | ICD-10-CM | POA: Insufficient documentation

## 2014-08-04 DIAGNOSIS — R0600 Dyspnea, unspecified: Secondary | ICD-10-CM

## 2014-08-04 LAB — MYOCARDIAL PERFUSION IMAGING
CHL CUP NUCLEAR SSS: 7
CHL CUP RESTING HR STRESS: 64 {beats}/min
CHL CUP STRESS STAGE 1 DBP: 77 mmHg
CHL CUP STRESS STAGE 3 DBP: 90 mmHg
CHL CUP STRESS STAGE 3 GRADE: 0 %
CHL CUP STRESS STAGE 5 GRADE: 0 %
CHL CUP STRESS STAGE 6 DBP: 80 mmHg
CHL CUP STRESS STAGE 6 HR: 70 {beats}/min
CHL CUP STRESS STAGE 6 SBP: 171 mmHg
Estimated workload: 1 METS
LVDIAVOL: 123 mL
LVSYSVOL: 57 mL
Nuc Stress EF: 53 %
Peak HR: 77 {beats}/min
Percent of predicted max HR: 48 %
RATE: 0.34
SDS: 1
SRS: 6
Stage 1 Grade: 0 %
Stage 1 HR: 67 {beats}/min
Stage 1 SBP: 153 mmHg
Stage 1 Speed: 0 mph
Stage 2 Grade: 0 %
Stage 2 HR: 67 {beats}/min
Stage 2 Speed: 0 mph
Stage 3 HR: 73 {beats}/min
Stage 3 SBP: 153 mmHg
Stage 3 Speed: 0 mph
Stage 4 Grade: 0 %
Stage 4 HR: 77 {beats}/min
Stage 4 Speed: 0 mph
Stage 5 DBP: 77 mmHg
Stage 5 HR: 75 {beats}/min
Stage 5 SBP: 148 mmHg
Stage 5 Speed: 0 mph
Stage 6 Grade: 0 %
Stage 6 Speed: 0 mph
TID: 1.16

## 2014-08-04 MED ORDER — REGADENOSON 0.4 MG/5ML IV SOLN
0.4000 mg | Freq: Once | INTRAVENOUS | Status: AC
  Administered 2014-08-04: 0.4 mg via INTRAVENOUS

## 2014-08-04 MED ORDER — TECHNETIUM TC 99M SESTAMIBI GENERIC - CARDIOLITE
33.0000 | Freq: Once | INTRAVENOUS | Status: AC | PRN
  Administered 2014-08-04: 33 via INTRAVENOUS

## 2014-08-04 MED ORDER — TECHNETIUM TC 99M SESTAMIBI GENERIC - CARDIOLITE
11.0000 | Freq: Once | INTRAVENOUS | Status: AC | PRN
  Administered 2014-08-04: 11 via INTRAVENOUS

## 2014-08-25 ENCOUNTER — Ambulatory Visit: Payer: Self-pay | Admitting: Diagnostic Neuroimaging

## 2014-08-28 ENCOUNTER — Encounter: Payer: Self-pay | Admitting: Diagnostic Neuroimaging

## 2014-10-04 ENCOUNTER — Encounter: Payer: Self-pay | Admitting: *Deleted

## 2014-11-04 ENCOUNTER — Ambulatory Visit (INDEPENDENT_AMBULATORY_CARE_PROVIDER_SITE_OTHER): Payer: BLUE CROSS/BLUE SHIELD | Admitting: Family Medicine

## 2014-11-04 ENCOUNTER — Ambulatory Visit (INDEPENDENT_AMBULATORY_CARE_PROVIDER_SITE_OTHER): Payer: BLUE CROSS/BLUE SHIELD

## 2014-11-04 VITALS — BP 138/76 | HR 82 | Temp 99.2°F | Resp 16 | Ht 70.0 in | Wt 237.4 lb

## 2014-11-04 DIAGNOSIS — M25562 Pain in left knee: Secondary | ICD-10-CM

## 2014-11-04 DIAGNOSIS — E1140 Type 2 diabetes mellitus with diabetic neuropathy, unspecified: Secondary | ICD-10-CM | POA: Diagnosis not present

## 2014-11-04 DIAGNOSIS — G8929 Other chronic pain: Secondary | ICD-10-CM

## 2014-11-04 LAB — POCT GLYCOSYLATED HEMOGLOBIN (HGB A1C): HEMOGLOBIN A1C: 10

## 2014-11-04 LAB — GLUCOSE, POCT (MANUAL RESULT ENTRY): POC Glucose: 162 mg/dl — AB (ref 70–99)

## 2014-11-04 MED ORDER — TRAMADOL HCL 50 MG PO TABS: 50.0000 mg | ORAL_TABLET | Freq: Three times a day (TID) | ORAL | Status: DC | PRN

## 2014-11-04 MED ORDER — DICLOFENAC SODIUM 75 MG PO TBEC: 75.0000 mg | DELAYED_RELEASE_TABLET | Freq: Two times a day (BID) | ORAL | Status: DC

## 2014-11-04 MED ORDER — GLIMEPIRIDE 2 MG PO TABS: 2.0000 mg | ORAL_TABLET | Freq: Every day | ORAL | Status: DC

## 2014-11-04 NOTE — Patient Instructions (Signed)
Take diclofenac one twice daily for knee  Where the knee splint  Referral is being made to the orthopedic doctor for evaluation and treatment of the knee  Continue taking the metformin very faithfully  Add Amaryl 2 mg (glimepiride 2 mg) 1 each morning for diabetes  Be conscientious in what you eat. Avoid excessive breads, pastas, potatoes, rice, etc. Try to get regular exercise which I know you get with your work.  Return in about 6 weeks for a recheck of his labs and diabetes  Monitor your sugars at home fasting and 2 hours after a main meal several times a week. Keep a list of that and bring in your diabetic sugar record when you come in.

## 2014-11-04 NOTE — Addendum Note (Signed)
Addended by: Sahiti Joswick, Refoel H on: 11/04/2014 12:04 PM   Modules accepted: Orders

## 2014-11-04 NOTE — Progress Notes (Signed)
Left knee pain Subjective:  Patient ID: [name], male    DOB: 10-18-1950  Age: 64 y.o. MRN: [id number]  64 year old man who has been having problems with pain in his left knee for the last 3-6 weeks. He gets a popping underneath the kneecap. He hurts a lot. When he gets up in the morning it's very painful and stiff sometimes, it takes him 30 minutes to get going. He works at a Architect job, on his feet for long hours, it is been very painful and miserable. The company wants him to go out of town with her job and he says he can do that. He has not had any major treatment for. He got hold of a knee brace the other day and he got some relief from that. Knows of no specific injury. It has swollen little it sometimes.  He does have a history of diabetes   Objective:   He does have knee pain but no other major distress. Very tender in his left knee medially in the joint line and anserine bursa area. No effusion could be palpated of any significance today. No crepitance. Ligaments intact. It wouldn't pop for him right now.  Assessment & Plan:   Assessment:  Left knee pain Diabetes  Plan:  X-ray left knee Check labs to help decide if we can give him an injection.  [facility] reading (PRIMARY) by  Dr. Linna Darner Normal knee  Results for orders placed or performed in visit on 11/04/14  POCT glucose (manual entry)  Result Value Ref Range   POC Glucose 162 (A) 70 - 99 mg/dl  POCT glycosylated hemoglobin (Hb A1C)  Result Value Ref Range   Hemoglobin A1C 10.0    . Discussed options for treatment of the knee. We'll give him a knee brace. Decided not to inject today but to try oral medications first. Will refer him to an orthopedist because of the pop he feels in his knee   Patient Instructions  Take diclofenac one twice daily for knee  Where the knee splint  Referral is being made to the orthopedic doctor for evaluation and treatment of the knee  Continue taking the metformin very  faithfully  Add Amaryl 2 mg (glimepiride 2 mg) 1 each morning for diabetes  Be conscientious in what you eat. Avoid excessive breads, pastas, potatoes, rice, etc. Try to get regular exercise which I know you get with your work.  Return in about 6 weeks for a recheck of his labs and diabetes  Monitor your sugars at home fasting and 2 hours after a main meal several times a week. Keep a list of that and bring in your diabetic sugar record when you come in.     HOPPER,Remi, MD 11/04/2014

## 2014-11-20 ENCOUNTER — Other Ambulatory Visit: Payer: Self-pay | Admitting: Family Medicine

## 2014-12-16 ENCOUNTER — Other Ambulatory Visit: Payer: Self-pay | Admitting: Physician Assistant

## 2014-12-17 ENCOUNTER — Ambulatory Visit (INDEPENDENT_AMBULATORY_CARE_PROVIDER_SITE_OTHER): Payer: BLUE CROSS/BLUE SHIELD | Admitting: Family Medicine

## 2014-12-17 VITALS — BP 163/92 | HR 78 | Temp 98.1°F | Resp 18 | Ht 69.0 in | Wt 239.0 lb

## 2014-12-17 DIAGNOSIS — E1140 Type 2 diabetes mellitus with diabetic neuropathy, unspecified: Secondary | ICD-10-CM | POA: Diagnosis not present

## 2014-12-17 DIAGNOSIS — I70219 Atherosclerosis of native arteries of extremities with intermittent claudication, unspecified extremity: Secondary | ICD-10-CM

## 2014-12-17 DIAGNOSIS — I10 Essential (primary) hypertension: Secondary | ICD-10-CM

## 2014-12-17 DIAGNOSIS — F1010 Alcohol abuse, uncomplicated: Secondary | ICD-10-CM

## 2014-12-17 DIAGNOSIS — I739 Peripheral vascular disease, unspecified: Secondary | ICD-10-CM

## 2014-12-17 MED ORDER — METFORMIN HCL 1000 MG PO TABS: ORAL_TABLET | ORAL | Status: DC

## 2014-12-17 MED ORDER — LISINOPRIL 10 MG PO TABS
10.0000 mg | ORAL_TABLET | Freq: Every day | ORAL | Status: DC
Start: 2014-12-17 — End: 2015-07-08

## 2014-12-17 MED ORDER — GLIMEPIRIDE 4 MG PO TABS: 4.0000 mg | ORAL_TABLET | Freq: Two times a day (BID) | ORAL | Status: DC

## 2014-12-17 NOTE — Progress Notes (Signed)
Subjective:  This chart was scribed for Delman Cheadle, MD by [name], Medical Scribe. This patient was seen in Room 13 and the patient's care was started 11:22 AM.    Patient ID: Clayton Ruddle., male    DOB: [birth year], 64 y.o.   MRN: [id number] Chief Complaint  Patient presents with  . Medication Refill    wants 90 day supply  . Flu Vaccine    HPI Clayton Azbill. is a 64 y.o. male who presents to [facility] for medication refill.   Few weeks ago, he saw orthopedic who gave him an intra-articular knee cortisone inj which has helped HUGELY.  Pt delighted with his response.  He states that he lost 15 lbs due to appetite change after taking medication that was given by neurologist - this is likely cymbalta. Neuro increased gabapentin from 900 bid to tid.  His a1c was 10 during todays visit and the a1c prior to that was 9.1. He measures his Frost sugar at home, running about 118-120 in the morning, 130-140 in evening. He states that he's been eating a lot of breads, pasta and cakes recently. He stopped all his medications because he would feel nauseous and unable to eat but has restarted all but lisinopril. He denies taking his lisinopril all the time because he doesn't feel it helps. Denies any hypoglycemic episodes. He sees [name] for ophthalmology in [place], [place]. Is due to an appt which will sched  His calves become really sore after climbing stairs. He noticed skin change to a "bluish hue" when it gets too cold. He did have xray and ct done on his chest, and noted there was some plaque seen.   He also received a flu shot today.   Past Medical History  Diagnosis Date  . Diabetes mellitus without complication ([place])   . Hypertension   . Hyperlipidemia   . GERD (gastroesophageal reflux disease)   . Cancer [facility])     Prostate cancer age 45; Lupron treatments followed by prostatectomy.  . Myocardial infarction [facility])     age 24; no stenting.  . OSA (obstructive sleep apnea)    non-compliant with CPAP.   Prior to Admission medications   Medication Sig Start Date End Date Taking? Authorizing Provider  aspirin 81 MG tablet Take 81 mg by mouth daily.   Yes Historical Provider, MD  azelastine (ASTELIN) 0.1 % nasal spray Place 2 sprays into both nostrils 2 (two) times daily. Use in each nostril as directed 05/05/14  Yes Clayton Russian, MD  diclofenac (VOLTAREN) 75 MG EC tablet Take 1 tablet (75 mg total) by mouth 2 (two) times daily. 11/04/14  Yes Clayton Boyer, MD  DULoxetine (CYMBALTA) 30 MG capsule Take 1 capsule (30 mg total) by mouth daily. 05/26/14  Yes Clayton Bombard, MD  gabapentin (NEURONTIN) 300 MG capsule Take 3 capsules (900 mg total) by mouth 3 (three) times daily. 05/26/14  Yes Clayton Bombard, MD  glimepiride (AMARYL) 2 MG tablet Take 1 tablet (2 mg total) by mouth daily before breakfast. 11/04/14  Yes Clayton Boyer, MD  lisinopril (PRINIVIL,ZESTRIL) 10 MG tablet Take 1 tablet (10 mg total) by mouth daily. 11/18/13  Yes Clayton Haber, MD  metFORMIN (GLUCOPHAGE) 1000 MG tablet TAKE 1 TABLET BY MOUTH TWO TIMES A DAY.   "OV NEEDED FOR FURTHER REFILLS" 11/21/14  Yes Clayton Jeffery, PA-C  omeprazole (PRILOSEC) 20 MG capsule Take 1 capsule (20 mg total) by mouth daily. 05/05/14  Yes Clayton Russian, MD  traMADol (ULTRAM) 50 MG tablet Take 1 tablet (50 mg total) by mouth every 8 (eight) hours as needed. 11/04/14  Yes Clayton Boyer, MD  vitamin B-12 (CYANOCOBALAMIN) 1000 MCG tablet Take 1,000 mcg by mouth daily.   Yes Historical Provider, MD  cyclobenzaprine (FLEXERIL) 10 MG tablet Take 1 tablet (10 mg total) by mouth at bedtime. Patient not taking: Reported on 12/17/2014 05/21/14   Clayton Knapp, MD   Allergies  Allergen Reactions  . Darvocet [Propoxyphene N-Acetaminophen] Hives    Review of Systems  Constitutional: Negative for fever and chills.  Respiratory: Negative for cough, shortness of breath and wheezing.   Cardiovascular: Negative for leg swelling.    Gastrointestinal: Negative for nausea, vomiting, diarrhea and constipation.  Musculoskeletal: Negative for myalgias, arthralgias and gait problem.  Skin: Negative for color change.  Neurological: Negative for weakness.       Objective:   Physical Exam  Constitutional: He is oriented to person, place, and time. He appears well-developed and well-nourished. No distress.  HENT:  Head: Normocephalic and atraumatic.  Eyes: EOM are normal. Pupils are equal, round, and reactive to light.  Neck: Neck supple.  Cardiovascular: Normal rate.   Pulmonary/Chest: Effort normal and breath sounds normal. No respiratory distress. He has no wheezes.  Musculoskeletal: Normal range of motion.  Neurological: He is alert and oriented to person, place, and time.  Skin: Skin is warm and dry.  Lower extremities cold bilaterally  Psychiatric: He has a normal mood and affect. His behavior is normal.  Nursing note and vitals reviewed.   BP 163/92 mmHg  Pulse 78  Temp(Src) 98.1 F (36.7 C) (Oral)  Resp 18  Ht 5\' 9"  (1.753 m)  Wt 239 lb (108.41 kg)  BMI 35.28 kg/m2  SpO2 97%       Assessment & Plan:   1. Type 2 diabetes mellitus with diabetic neuropathy, without long-term current use of insulin (HCC) - spent >15 min discussing w/ pt that his continued poor/woresning DM control will cause severe irreversible comorbidities which will occur much sooner with his current poor control. Pt plans to work on [place].  Cont metformin, increase amaryl from 2 qam to 4 bid since a1c 10.0 6 wks prior - up from 9.1 7 mos prior.  Recheck a1c at next OV in 3 mos - pt requests a.m. fri appt and will come fasting.  2. Essential hypertension   3. Atherosclerotic peripheral vascular disease with intermittent claudication (HCC) - pt with known vascular disease seen on cardiac w/u - now having worsening sxs of cold feet and claudication - pt wants to minimaize costs so will check lower ext arterial doppler and refer to vascular if  sig abnormal. Cont asa qd  4.       Alcohol abuse - pt is trying to decrease amount of beer and sugary liquors but seems to be in some denial about severity of abuse and its effects on his medical problems  Orders Placed This Encounter  Procedures  . [place] Lower Ext Art Bilat    Standing Status: Future     Number of Occurrences:      Standing Expiration Date: 02/16/2016    Order Specific Question:  Reason for Exam (SYMPTOM  OR DIAGNOSIS REQUIRED)    Answer:  worsening feet coolness and claudication sxs. eval for severity of PAD    Order Specific Question:  Preferred imaging location?    Answer:  [facility]  Meds ordered this encounter  Medications  . glimepiride (AMARYL) 4 MG tablet    Sig: Take 1 tablet (4 mg total) by mouth 2 (two) times daily before a meal.    Dispense:  180 tablet    Refill:  1  . metFORMIN (GLUCOPHAGE) 1000 MG tablet    Sig: TAKE 1 TABLET BY MOUTH TWO TIMES A DAY.    Dispense:  180 tablet    Refill:  1  . lisinopril (PRINIVIL,ZESTRIL) 10 MG tablet    Sig: Take 1 tablet (10 mg total) by mouth daily.    Dispense:  90 tablet    Refill:  1    I personally performed the services described in this documentation, which was scribed in my presence. The recorded information has been reviewed and considered, and addended by me as needed.  Delman Cheadle, MD MPH  By signing my name below, I, [name], attest that this documentation has been prepared under the direction and in the presence of Delman Cheadle, MD. Electronically Signed: Moises Frost, [place]. 12/17/2014 , 11:39 AM .

## 2014-12-17 NOTE — Patient Instructions (Signed)
Blood Glucose Monitoring, Adult Monitoring your blood glucose (also know as blood sugar) helps you to manage your diabetes. It also helps you and your health care provider monitor your diabetes and determine how well your treatment plan is working. WHY SHOULD YOU MONITOR YOUR BLOOD GLUCOSE?  It can help you understand how food, exercise, and medicine affect your blood glucose.  It allows you to know what your blood glucose is at any given moment. You can quickly tell if you are having low blood glucose (hypoglycemia) or high blood glucose (hyperglycemia).  It can help you and your health care provider know how to adjust your medicines.  It can help you understand how to manage an illness or adjust medicine for exercise. WHEN SHOULD YOU TEST? Your health care provider will help you decide how often you should check your blood glucose. This may depend on the type of diabetes you have, your diabetes control, or the types of medicines you are taking. Be sure to write down all of your blood glucose readings so that this information can be reviewed with your health care provider. See below for examples of testing times that your health care provider may suggest. Type 1 Diabetes  Test at least 2 times per day if your diabetes is well controlled, if you are using an insulin pump, or if you perform multiple daily injections.  If your diabetes is not well controlled or if you are sick, you may need to test more often.  It is a good idea to also test:  Before every insulin injection.  Before and after exercise.  Between meals and 2 hours after a meal.  Occasionally between 2:00 a.m. and 3:00 a.m. Type 2 Diabetes  If you are taking insulin, test at least 2 times per day. However, it is best to test before every insulin injection.  If you take medicines by mouth (orally), test 2 times a day.  If you are on a controlled diet, test once a day.  If your diabetes is not well controlled or if you  are sick, you may need to monitor more often. HOW TO MONITOR YOUR BLOOD GLUCOSE Supplies Needed  Blood glucose meter.  Test strips for your meter. Each meter has its own strips. You must use the strips that go with your own meter.  A pricking needle (lancet).  A device that holds the lancet (lancing device).  A journal or log book to write down your results. Procedure  Wash your hands with soap and water. Alcohol is not preferred.  Prick the side of your finger (not the tip) with the lancet.  Gently milk the finger until a small drop of blood appears.  Follow the instructions that come with your meter for inserting the test strip, applying blood to the strip, and using your blood glucose meter. Other Areas to Get Blood for Testing Some meters allow you to use other areas of your body (other than your finger) to test your blood. These areas are called alternative sites. The most common alternative sites are:  The forearm.  The thigh.  The back area of the lower leg.  The palm of the hand. The blood flow in these areas is slower. Therefore, the blood glucose values you get may be delayed, and the numbers are different from what you would get from your fingers. Do not use alternative sites if you think you are having hypoglycemia. Your reading will not be accurate. Always use a finger if you are  having hypoglycemia. Also, if you cannot feel your lows (hypoglycemia unawareness), always use your fingers for your blood glucose checks. ADDITIONAL TIPS FOR GLUCOSE MONITORING  Do not reuse lancets.  Always carry your supplies with you.  All blood glucose meters have a 24-hour "hotline" number to call if you have questions or need help.  Adjust (calibrate) your blood glucose meter with a control solution after finishing a few boxes of strips. BLOOD GLUCOSE RECORD KEEPING It is a good idea to keep a daily record or log of your blood glucose readings. Most glucose meters, if not all,  keep your glucose records stored in the meter. Some meters come with the ability to download your records to your home computer. Keeping a record of your blood glucose readings is especially helpful if you are wanting to look for patterns. Make notes to go along with the blood glucose readings because you might forget what happened at that exact time. Keeping good records helps you and your health care provider to work together to achieve good diabetes management.    This information is not intended to replace advice given to you by your health care provider. Make sure you discuss any questions you have with your health care provider.   Document Released: 02/20/2003 Document Revised: 03/10/2014 Document Reviewed: 07/12/2012 Elsevier Interactive Patient Education 2016 [employer]. Diabetes Mellitus and Food It is important for you to manage your blood sugar (glucose) level. Your blood glucose level can be greatly affected by what you eat. Eating healthier foods in the appropriate amounts throughout the day at about the same time each day will help you control your blood glucose level. It can also help slow or prevent worsening of your diabetes mellitus. Healthy eating may even help you improve the level of your blood pressure and reach or maintain a healthy weight.  General recommendations for healthful eating and cooking habits include:  Eating meals and snacks regularly. Avoid going long periods of time without eating to lose weight.  Eating a diet that consists mainly of plant-based foods, such as fruits, vegetables, nuts, legumes, and whole grains.  Using low-heat cooking methods, such as baking, instead of high-heat cooking methods, such as deep frying. Work with your dietitian to make sure you understand how to use the Nutrition Facts information on food labels. HOW CAN FOOD AFFECT ME? Carbohydrates Carbohydrates affect your blood glucose level more than any other type of food. Your  dietitian will help you determine how many carbohydrates to eat at each meal and teach you how to count carbohydrates. Counting carbohydrates is important to keep your blood glucose at a healthy level, especially if you are using insulin or taking certain medicines for diabetes mellitus. Alcohol Alcohol can cause sudden decreases in blood glucose (hypoglycemia), especially if you use insulin or take certain medicines for diabetes mellitus. Hypoglycemia can be a life-threatening condition. Symptoms of hypoglycemia (sleepiness, dizziness, and disorientation) are similar to symptoms of having too much alcohol.  If your health care provider has given you approval to drink alcohol, do so in moderation and use the following guidelines:  Women should not have more than one drink per day, and men should not have more than two drinks per day. One drink is equal to:  12 oz of beer.  5 oz of wine.  1 oz of hard liquor.  Do not drink on an empty stomach.  Keep yourself hydrated. Have water, diet soda, or unsweetened iced tea.  Regular soda, juice, and other  mixers might contain a lot of carbohydrates and should be counted. WHAT FOODS ARE NOT RECOMMENDED? As you make food choices, it is important to remember that all foods are not the same. Some foods have fewer nutrients per serving than other foods, even though they might have the same number of calories or carbohydrates. It is difficult to get your body what it needs when you eat foods with fewer nutrients. Examples of foods that you should avoid that are high in calories and carbohydrates but low in nutrients include:  Trans fats (most processed foods list trans fats on the Nutrition Facts label).  Regular soda.  Juice.  Candy.  Sweets, such as cake, pie, doughnuts, and cookies.  Fried foods. WHAT FOODS CAN I EAT? Eat nutrient-rich foods, which will nourish your body and keep you healthy. The food you should eat also will depend on several  factors, including:  The calories you need.  The medicines you take.  Your weight.  Your blood glucose level.  Your blood pressure level.  Your cholesterol level. You should eat a variety of foods, including:  Protein.  Lean cuts of meat.  Proteins low in saturated fats, such as fish, egg whites, and beans. Avoid processed meats.  Fruits and vegetables.  Fruits and vegetables that may help control blood glucose levels, such as apples, mangoes, and yams.  Dairy products.  Choose fat-free or low-fat dairy products, such as milk, yogurt, and cheese.  Grains, bread, pasta, and rice.  Choose whole grain products, such as multigrain bread, whole oats, and brown rice. These foods may help control blood pressure.  Fats.  Foods containing healthful fats, such as nuts, avocado, olive oil, canola oil, and fish. DOES EVERYONE WITH DIABETES MELLITUS HAVE THE SAME MEAL PLAN? Because every person with diabetes mellitus is different, there is not one meal plan that works for everyone. It is very important that you meet with a dietitian who will help you create a meal plan that is just right for you.   This information is not intended to replace advice given to you by your health care provider. Make sure you discuss any questions you have with your health care provider.   Document Released: 11/14/2004 Document Revised: 03/10/2014 Document Reviewed: 01/14/2013 Elsevier Interactive Patient Education 2016 [employer]. Diabetes and Standards of Medical Care Diabetes is complicated. You may find that your diabetes team includes a dietitian, nurse, diabetes educator, eye doctor, and more. To help everyone know what is going on and to help you get the care you deserve, the following schedule of care was developed to help keep you on track. Below are the tests, exams, vaccines, medicines, education, and plans you will need. HbA1c test This test shows how well you have controlled your glucose  over the past 2-3 months. It is used to see if your diabetes management plan needs to be adjusted.   It is performed at least 2 times a year if you are meeting treatment goals.  It is performed 4 times a year if therapy has changed or if you are not meeting treatment goals. Blood pressure test  This test is performed at every routine medical visit. The goal is less than 140/90 mm Hg for most people, but 130/80 mm Hg in some cases. Ask your health care provider about your goal. Dental exam  Follow up with the dentist regularly. Eye exam  If you are diagnosed with type 1 diabetes as a child, get an exam upon reaching the  age of 11 years or older and having had diabetes for 3-5 years. Yearly eye exams are recommended after that initial eye exam.  If you are diagnosed with type 1 diabetes as an adult, get an exam within 5 years of diagnosis and then yearly.  If you are diagnosed with type 2 diabetes, get an exam as soon as possible after the diagnosis and then yearly. Foot care exam  Visual foot exams are performed at every routine medical visit. The exams check for cuts, injuries, or other problems with the feet.  You should have a complete foot exam performed every year. This exam includes an inspection of the structure and skin of your feet, a check of the pulses in your feet, and a check of the sensation in your feet.  Type 1 diabetes: The first exam is performed 5 years after diagnosis.  Type 2 diabetes: The first exam is performed at the time of diagnosis.  Check your feet nightly for cuts, injuries, or other problems with your feet. Tell your health care provider if anything is not healing. Kidney function test (urine microalbumin)  This test is performed once a year.  Type 1 diabetes: The first test is performed 5 years after diagnosis.  Type 2 diabetes: The first test is performed at the time of diagnosis.  A serum creatinine and estimated glomerular filtration rate (eGFR)  test is done once a year to assess the level of chronic kidney disease (CKD), if present. Lipid profile (cholesterol, HDL, LDL, triglycerides)  Performed every 5 years for most people.  The goal for LDL is less than 100 mg/dL. If you are at high risk, the goal is less than 70 mg/dL.  The goal for HDL is 40 mg/dL-50 mg/dL for men and 50 mg/dL-60 mg/dL for women. An HDL cholesterol of 60 mg/dL or higher gives some protection against heart disease.  The goal for triglycerides is less than 150 mg/dL. Immunizations  The flu (influenza) vaccine is recommended yearly for every person 30 months of age or older who has diabetes.  The pneumonia (pneumococcal) vaccine is recommended for every person 77 years of age or older who has diabetes. Adults 68 years of age or older may receive the pneumonia vaccine as a series of two separate shots.  The hepatitis B vaccine is recommended for adults shortly after they have been diagnosed with diabetes.  The Tdap (tetanus, diphtheria, and pertussis) vaccine should be given:  According to normal childhood vaccination schedules, for children.  Every 10 years, for adults who have diabetes. Diabetes self-management education  Education is recommended at diagnosis and ongoing as needed. Treatment plan  Your treatment plan is reviewed at every medical visit.   This information is not intended to replace advice given to you by your health care provider. Make sure you discuss any questions you have with your health care provider.   Document Released: 12/15/2008 Document Revised: 03/10/2014 Document Reviewed: 07/20/2012 Elsevier Interactive Patient Education [employer].

## 2014-12-18 DIAGNOSIS — F1010 Alcohol abuse, uncomplicated: Secondary | ICD-10-CM | POA: Insufficient documentation

## 2014-12-18 NOTE — Progress Notes (Signed)
Appointment made for April 20th @ 8am for a 6 month reck.

## 2015-05-17 ENCOUNTER — Other Ambulatory Visit: Payer: Self-pay | Admitting: Emergency Medicine

## 2015-05-29 ENCOUNTER — Other Ambulatory Visit: Payer: Self-pay | Admitting: Family Medicine

## 2015-06-18 ENCOUNTER — Other Ambulatory Visit: Payer: Self-pay | Admitting: Diagnostic Neuroimaging

## 2015-06-21 ENCOUNTER — Ambulatory Visit: Payer: BLUE CROSS/BLUE SHIELD | Admitting: Family Medicine

## 2015-07-08 ENCOUNTER — Other Ambulatory Visit: Payer: Self-pay | Admitting: Family Medicine

## 2015-07-15 ENCOUNTER — Other Ambulatory Visit: Payer: Self-pay | Admitting: Family Medicine

## 2015-07-15 ENCOUNTER — Other Ambulatory Visit: Payer: Self-pay | Admitting: Diagnostic Neuroimaging

## 2015-08-04 ENCOUNTER — Ambulatory Visit (INDEPENDENT_AMBULATORY_CARE_PROVIDER_SITE_OTHER): Payer: BLUE CROSS/BLUE SHIELD | Admitting: Emergency Medicine

## 2015-08-04 ENCOUNTER — Ambulatory Visit (INDEPENDENT_AMBULATORY_CARE_PROVIDER_SITE_OTHER): Payer: BLUE CROSS/BLUE SHIELD

## 2015-08-04 VITALS — BP 151/81 | HR 80 | Temp 97.6°F | Resp 16 | Ht 69.0 in | Wt 240.0 lb

## 2015-08-04 DIAGNOSIS — E1140 Type 2 diabetes mellitus with diabetic neuropathy, unspecified: Secondary | ICD-10-CM

## 2015-08-04 DIAGNOSIS — I10 Essential (primary) hypertension: Secondary | ICD-10-CM

## 2015-08-04 DIAGNOSIS — K219 Gastro-esophageal reflux disease without esophagitis: Secondary | ICD-10-CM | POA: Diagnosis not present

## 2015-08-04 DIAGNOSIS — M25571 Pain in right ankle and joints of right foot: Secondary | ICD-10-CM

## 2015-08-04 DIAGNOSIS — Z1159 Encounter for screening for other viral diseases: Secondary | ICD-10-CM | POA: Diagnosis not present

## 2015-08-04 DIAGNOSIS — G629 Polyneuropathy, unspecified: Secondary | ICD-10-CM

## 2015-08-04 DIAGNOSIS — Z23 Encounter for immunization: Secondary | ICD-10-CM

## 2015-08-04 DIAGNOSIS — F1010 Alcohol abuse, uncomplicated: Secondary | ICD-10-CM

## 2015-08-04 DIAGNOSIS — Z125 Encounter for screening for malignant neoplasm of prostate: Secondary | ICD-10-CM

## 2015-08-04 LAB — COMPLETE METABOLIC PANEL WITH GFR
ALT: 38 U/L (ref 9–46)
AST: 38 U/L — ABNORMAL HIGH (ref 10–35)
Albumin: 4.3 g/dL (ref 3.6–5.1)
Alkaline Phosphatase: 78 U/L (ref 40–115)
BUN: 13 mg/dL (ref 7–25)
CALCIUM: 9.6 mg/dL (ref 8.6–10.3)
CHLORIDE: 102 mmol/L (ref 98–110)
CO2: 23 mmol/L (ref 20–31)
Creat: 0.86 mg/dL (ref 0.70–1.25)
GFR, Est Non African American: >89 mL/min (ref >=60)
Glucose, Bld: 156 mg/dL — ABNORMAL HIGH (ref 65–99)
POTASSIUM: 4.4 mmol/L (ref 3.5–5.3)
Sodium: 138 mmol/L (ref 135–146)
Total Bilirubin: 0.7 mg/dL (ref 0.2–1.2)
Total Protein: 6.8 g/dL (ref 6.1–8.1)

## 2015-08-04 LAB — POCT CBC
GRANULOCYTE PERCENT: 59.3 % (ref 37–80)
HEMATOCRIT: 44.8 % (ref 43.5–53.7)
HEMOGLOBIN: 16 g/dL (ref 14.1–18.1)
Lymph, poc: 2.5 (ref 0.6–3.4)
MCH, POC: 32.6 pg — AB (ref 27–31.2)
MCHC: 35.6 g/dL — AB (ref 31.8–35.4)
MCV: 91.5 fL (ref 80–97)
MID (cbc): 0.6 (ref 0–0.9)
MPV: 7.9 fL (ref 0–99.8)
POC GRANULOCYTE: 4.5 (ref 2–6.9)
POC LYMPH %: 33.3 % (ref 10–50)
POC MID %: 7.4 % (ref 0–12)
Platelet Count, POC: 159 10*3/uL (ref 142–424)
RBC: 4.9 M/uL (ref 4.69–6.13)
RDW, POC: 12.4 %
WBC: 7.6 10*3/uL (ref 4.6–10.2)

## 2015-08-04 LAB — LIPID PANEL
CHOLESTEROL: 227 mg/dL — AB (ref 125–200)
HDL: 55 mg/dL (ref >=40)
LDL Cholesterol: 130 mg/dL — ABNORMAL HIGH (ref <130)
TRIGLYCERIDES: 209 mg/dL — AB (ref <150)
Total CHOL/HDL Ratio: 4.1 Ratio (ref <=5.0)
VLDL: 42 mg/dL — ABNORMAL HIGH (ref <30)

## 2015-08-04 LAB — POCT GLYCOSYLATED HEMOGLOBIN (HGB A1C): Hemoglobin A1C: 9.8

## 2015-08-04 LAB — MICROALBUMIN, URINE: Microalb, Ur: 3.6 mg/dL

## 2015-08-04 LAB — GLUCOSE, POCT (MANUAL RESULT ENTRY): POC GLUCOSE: 138 mg/dL — AB (ref 70–99)

## 2015-08-04 LAB — HEPATITIS C ANTIBODY: HCV AB: NEGATIVE

## 2015-08-04 MED ORDER — GLIMEPIRIDE 4 MG PO TABS: ORAL_TABLET | ORAL | Status: DC

## 2015-08-04 MED ORDER — GABAPENTIN 300 MG PO CAPS: 900.0000 mg | ORAL_CAPSULE | Freq: Three times a day (TID) | ORAL | Status: DC

## 2015-08-04 MED ORDER — OMEPRAZOLE 20 MG PO CPDR: 20.0000 mg | DELAYED_RELEASE_CAPSULE | Freq: Every day | ORAL | Status: DC

## 2015-08-04 MED ORDER — ATORVASTATIN CALCIUM 20 MG PO TABS: 20.0000 mg | ORAL_TABLET | Freq: Every day | ORAL | Status: DC

## 2015-08-04 MED ORDER — METFORMIN HCL 1000 MG PO TABS: ORAL_TABLET | ORAL | Status: DC

## 2015-08-04 NOTE — Patient Instructions (Signed)
     IF you received an x-ray today, you will receive an invoice from [facility]. Please contact [facility] at [phone] with questions or concerns regarding your invoice.   IF you received labwork today, you will receive an invoice from [facility]. Please contact [facility] at [phone] with questions or concerns regarding your invoice.   Our billing staff will not be able to assist you with questions regarding bills from these companies.  You will be contacted with the lab results as soon as they are available. The fastest way to get your results is to activate your My Chart account. Instructions are located on the last page of this paperwork. If you have not heard from us regarding the results in 2 weeks, please contact this office.      

## 2015-08-04 NOTE — Progress Notes (Addendum)
By signing my name below, I, [name], attest that this documentation has been prepared under the direction and in the presence of Clayton Jordan, MD. Electronically Signed: Judithe Frost, ER Scribe. 08/04/2015. 2:10 PM.  Chief Complaint:  Chief Complaint  Patient presents with  . Medication Refill    gabapentin, Omeprazole, Metformin   . Foot Pain    was on a ladder for a long period of time on Thursday and has had right foot pain since then     HPI: [name] is a 65 y.o. male who reports to [facility] today reporting for a medication refill. He is also complaining of right foot pain after working on a ladder several days ago. He stopped his blood pressure medication because it made him feel weak. He has never smoked, but he chews tobacco. He drinks 2-3 beers, every few days. His blood sugar was 202 this morning. His blood sugar has ranged between 112-160 for the last six months. He does not go to the eye doctor regularly. He does not go to the dentist regularly. He has dentures.    Past Medical History  Diagnosis Date  . Diabetes mellitus without complication ([place])   . Hypertension   . Hyperlipidemia   . GERD (gastroesophageal reflux disease)   . Cancer [facility])     Prostate cancer age 69; Lupron treatments followed by prostatectomy.  . Myocardial infarction [facility])     age 42; no stenting.  . OSA (obstructive sleep apnea)     non-compliant with CPAP.   Past Surgical History  Procedure Laterality Date  . Cholecystectomy    . Prostate surgery     Social History   Social History  . Marital Status: Single    Spouse Name: N/A  . Number of Children: 2  . Years of Education: 24   Social History Main Topics  . Smoking status: Never Smoker   . Smokeless tobacco: Current User    Types: Chew  . Alcohol Use: 0.0 oz/week     Comment: social  . Drug Use: No  . Sexual Activity: Not Currently   Other Topics Concern  . None   Social History Narrative   Marital  status: divorced     Children: 1      Employment:        Tobacco: none; chew tobacco      Alcohol: beers on weekends      Exercise: none   Family History  Problem Relation Age of Onset  . Cancer Mother     leukemia  . Diabetes Mother   . Cancer Father 3    lung cancer with mets  . Heart disease Maternal Grandmother   . Cancer Maternal Grandfather   . Heart disease Paternal Grandmother   . Cancer Paternal Grandfather    Allergies  Allergen Reactions  . Cymbalta [Duloxetine Hcl] Nausea And Vomiting  . Darvocet [Propoxyphene N-Acetaminophen] Hives   Prior to Admission medications   Medication Sig Start Date End Date Taking? Authorizing Provider  aspirin 81 MG tablet Take 81 mg by mouth daily.   Yes Historical Provider, MD  azelastine (ASTELIN) 0.1 % nasal spray Place 2 sprays into both nostrils 2 (two) times daily. Use in each nostril as directed 05/05/14  Yes Darlyne Russian, MD  gabapentin (NEURONTIN) 300 MG capsule Take 3 capsules (900 mg total) by mouth 3 (three) times daily. 05/26/14  Yes Penni Bombard, MD  glimepiride (AMARYL) 4 MG tablet  TAKE 1 TABLET (4 MG TOTAL) BY MOUTH 2 (TWO) TIMES DAILY BEFORE A MEAL. 07/09/15  Yes Shawnee Knapp, MD  metFORMIN (GLUCOPHAGE) 1000 MG tablet TAKE 1 TABLET BY MOUTH TWO TIMES A DAY. 05/29/15  Yes Shawnee Knapp, MD  omeprazole (PRILOSEC) 20 MG capsule Take 1 capsule (20 mg total) by mouth daily. 05/05/14  Yes Darlyne Russian, MD  vitamin B-12 (CYANOCOBALAMIN) 1000 MCG tablet Take 1,000 mcg by mouth daily.   Yes Historical Provider, MD     ROS: The patient denies fevers, chills, night sweats, unintentional weight loss, chest pain, palpitations, wheezing, dyspnea on exertion, nausea, vomiting, abdominal pain, dysuria, hematuria, melena.   All other systems have been reviewed and were otherwise negative with the exception of those mentioned in the HPI and as above.    PHYSICAL EXAM: Filed Vitals:   08/04/15 1246  BP: 151/81  Pulse: 80  Temp: 97.6 F  (36.4 C)  Resp: 16   Body mass index is 35.43 kg/(m^2).   General: Alert, no acute distress HEENT:  Normocephalic, atraumatic, oropharynx patent. He has a peg implant right lower jaw Neck: No adenopathy Eye: EOMI, PEERLDC Cardiovascular:  Regular rate and rhythm, no rubs murmurs or gallops.  No Carotid bruits, radial pulse intact. No pedal edema.  Respiratory: Clear to auscultation bilaterally.  No wheezes, rales, or rhonchi.  No cyanosis, no use of accessory musculature Abdominal: No organomegaly, abdomen is soft and non-tender, positive bowel sounds.  No masses. Musculoskeletal: Gait limited due to pain. Exquisite tenderness over right heel. Skin: No rashes. Neurologic: Facial musculature symmetric. Psychiatric: Patient acts appropriately throughout our interaction. Lymphatic: No cervical or submandibular lymphadenopathy   LABS: Results for orders placed or performed in visit on 08/04/15  POCT CBC  Result Value Ref Range   WBC 7.6 4.6 - 10.2 K/uL   Lymph, poc 2.5 0.6 - 3.4   POC LYMPH PERCENT 33.3 10 - 50 %L   MID (cbc) 0.6 0 - 0.9   POC MID % 7.4 0 - 12 %M   POC Granulocyte 4.5 2 - 6.9   Granulocyte percent 59.3 37 - 80 %G   RBC 4.90 4.69 - 6.13 M/uL   Hemoglobin 16.0 14.1 - 18.1 g/dL   HCT, POC 44.8 43.5 - 53.7 %   MCV 91.5 80 - 97 fL   MCH, POC 32.6 (A) 27 - 31.2 pg   MCHC 35.6 (A) 31.8 - 35.4 g/dL   RDW, POC 12.4 %   Platelet Count, POC 159 142 - 424 K/uL   MPV 7.9 0 - 99.8 fL  POCT glucose (manual entry)  Result Value Ref Range   POC Glucose 138 (A) 70 - 99 mg/dl  POCT glycosylated hemoglobin (Hb A1C)  Result Value Ref Range   Hemoglobin A1C 9.8    Lab Results  Component Value Date   HGBA1C 9.8 08/04/2015   Meds ordered this encounter  Medications  . metFORMIN (GLUCOPHAGE) 1000 MG tablet    Sig: TAKE 1 TABLET BY MOUTH TWO TIMES A DAY.    Dispense:  180 tablet    Refill:  3  . glimepiride (AMARYL) 4 MG tablet    Sig: TAKE 1 TABLET (4 MG TOTAL) BY MOUTH  2 (TWO) TIMES DAILY BEFORE A MEAL.    Dispense:  180 tablet    Refill:  3  . omeprazole (PRILOSEC) 20 MG capsule    Sig: Take 1 capsule (20 mg total) by mouth daily.    Dispense:  90 capsule    Refill:  3  . gabapentin (NEURONTIN) 300 MG capsule    Sig: Take 3 capsules (900 mg total) by mouth 3 (three) times daily.    Dispense:  270 capsule    Refill:  3  . atorvastatin (LIPITOR) 20 MG tablet    Sig: Take 1 tablet (20 mg total) by mouth daily.    Dispense:  90 tablet    Refill:  3    EKG/XRAY:   Primary read interpreted by Dr. Everlene Farrier at [facility]. Dg Foot Complete Right  08/04/2015  CLINICAL DATA:  Right ankle and foot pain. EXAM: RIGHT FOOT COMPLETE - 3+ VIEW COMPARISON:  None. FINDINGS: Early degenerative changes in the right first MTP joint. Early joint space narrowing and spurring. No acute bony abnormality. Specifically, no fracture, subluxation, or dislocation. Soft tissues are intact. Small plantar calcaneal spur. IMPRESSION: No acute bony abnormality. Electronically Signed   By: Rolm Baptise M.D.   On: 08/04/2015 14:19    ASSESSMENT/PLAN: I did not change his diabetes medicines. I did make a referral to low [facility] endocrinology for adjustment of medications. I went ahead and gave him pneumonia 23 vaccine and started him on Lipitor 10 mg daily. I did not start him on a blood pressure medication yet but he will need to be on one. He states he had trouble with an ACE inhibitor he tried in the past.I personally performed the services described in this documentation, which was scribed in my presence. The recorded information has been reviewed and is accurate.While in the office he stated his wife was home is ill with a fever and a rash. I told him this could be spotted fever. I told him he needed to take his wife immediately to the emergency room to be evaluated for spotted fever. I told him if it was spotted fever she could die.I personally performed the services described in this documentation,  which was scribed in my presence. The recorded information has been reviewed and is accurate.     Gross sideeffects, risk and benefits, and alternatives of medications d/w patient. Patient is aware that all medications have potential sideeffects and we are unable to predict every sideeffect or drug-drug interaction that may occur.  Arlyss Queen MD 08/04/2015 1:50 PM

## 2015-08-06 LAB — PSA

## 2015-08-24 ENCOUNTER — Ambulatory Visit: Payer: BLUE CROSS/BLUE SHIELD | Admitting: Dietician

## 2015-08-24 ENCOUNTER — Encounter: Payer: Self-pay | Admitting: Endocrinology

## 2015-08-24 ENCOUNTER — Ambulatory Visit (INDEPENDENT_AMBULATORY_CARE_PROVIDER_SITE_OTHER): Payer: BLUE CROSS/BLUE SHIELD | Admitting: Endocrinology

## 2015-08-24 ENCOUNTER — Ambulatory Visit: Payer: BLUE CROSS/BLUE SHIELD | Admitting: Endocrinology

## 2015-08-24 VITALS — BP 126/64 | HR 74 | Wt 242.0 lb

## 2015-08-24 DIAGNOSIS — E785 Hyperlipidemia, unspecified: Secondary | ICD-10-CM

## 2015-08-24 DIAGNOSIS — E1165 Type 2 diabetes mellitus with hyperglycemia: Secondary | ICD-10-CM

## 2015-08-24 DIAGNOSIS — R29898 Other symptoms and signs involving the musculoskeletal system: Secondary | ICD-10-CM | POA: Diagnosis not present

## 2015-08-24 DIAGNOSIS — E1142 Type 2 diabetes mellitus with diabetic polyneuropathy: Secondary | ICD-10-CM

## 2015-08-24 DIAGNOSIS — E1169 Type 2 diabetes mellitus with other specified complication: Secondary | ICD-10-CM

## 2015-08-24 MED ORDER — CANAGLIFLOZIN 100 MG PO TABS: ORAL_TABLET | ORAL | Status: DC

## 2015-08-24 NOTE — Progress Notes (Signed)
Patient ID: Clayton Mcgaha., male   DOB: 15-Dec-1950, 65 y.o.   MRN: [id number]           Reason for Appointment: Consultation for poorly controlled Type 2 Diabetes  Referring physician: Linna Darner   History of Present Illness:          Date of diagnosis of type 2 diabetes mellitus:?  2002       Background history:   He thinks he had increased thirst at the time of diagnosis which was probably 12-14 years ago He was treated with metformin for several years Subsequently Amaryl was added and this has been continued Previous level of control is unknown, records available only since 2013  His blood sugars have been generally only and fair control with A1c usually over 7% and starting to increase in 11/2013  Recent history:     He has been referred here for management of his poorly controlled diabetes and A1c consistently over 9% now in 3/16  Non-insulin hypoglycemic drugs the patient is taking are: Metformin ?  2 g twice a day.  Amaryl 4 mg twice a day  Current management, blood sugar patterns and problems identified:  He says he is checking his blood sugar with a Generic monitor from [facility] and is not keeping a record  He says his blood sugars in the mornings are quite variable and mostly in the 150-200 range but higher today.  Tends to be higher when he has snacks like fruit later at night or during the night  Although he is main meal is at lunch she does not think he sugars are significantly high before dinner  Does not check his sugars after dinner  He was told by another doctor to take 2 tablets twice a day of the metformin 1000 mg.  He takes the first dose in the morning without any food and tends to have nausea or vomiting late morning          Side effects from medications have been: None  Compliance with the medical regimen: Fair Hypoglycemia: None    Glucose monitoring:  done?  1-to  times a day         Glucometer:? Blood Glucose readings by recall   PREMEAL Breakfast  Lunch Dinner Bedtime  Overall   Glucose range: 150- 290  120-200    Median:        Self-care: The diet that the patient has been following is: None He will have some Sweet tea and regular soft drinks at times especially at lunch    Meal times are:  Breakfast is skipped Typical meal intake: Meat and 2 vegetables at lunch, snacks or fruits at dinner              Dietician visit, most recent: Never               Exercise:  none except some walking at work, does walk relatively slowly  Weight history:  Wt Readings from Last 3 Encounters:  08/24/15 242 lb (109.77 kg)  08/24/15 242 lb (109.77 kg)  08/04/15 240 lb (108.863 kg)    Glycemic control:   Lab Results  Component Value Date   HGBA1C 9.8 08/04/2015   HGBA1C 10.0 11/04/2014   HGBA1C 9.1 05/05/2014   Lab Results  Component Value Date   MICROALBUR 3.6 08/04/2015   LDLCALC 130* 08/04/2015   CREATININE 0.86 08/04/2015   No results found for: [facility]       Medication List  This list is accurate as of: 08/24/15  5:08 PM.  Always use your most recent med list.               aspirin 81 MG tablet  Take 81 mg by mouth daily.     atorvastatin 20 MG tablet  Commonly known as:  LIPITOR  Take 1 tablet (20 mg total) by mouth daily.     azelastine 0.1 % nasal spray  Commonly known as:  ASTELIN  Place 2 sprays into both nostrils 2 (two) times daily. Use in each nostril as directed     canagliflozin 100 MG Tabs tablet  Commonly known as:  INVOKANA  1 tablet before breakfast     gabapentin 300 MG capsule  Commonly known as:  NEURONTIN  Take 3 capsules (900 mg total) by mouth 3 (three) times daily.     glimepiride 4 MG tablet  Commonly known as:  AMARYL  TAKE 1 TABLET (4 MG TOTAL) BY MOUTH 2 (TWO) TIMES DAILY BEFORE A MEAL.     metFORMIN 1000 MG tablet  Commonly known as:  GLUCOPHAGE  TAKE 1 TABLET BY MOUTH TWO TIMES A DAY.     omeprazole 20 MG capsule  Commonly known as:  PRILOSEC  Take 1 capsule  (20 mg total) by mouth daily.     vitamin B-12 1000 MCG tablet  Commonly known as:  CYANOCOBALAMIN  Take 1,000 mcg by mouth daily.        Allergies:  Allergies  Allergen Reactions  . Cymbalta [Duloxetine Hcl] Nausea And Vomiting  . Darvocet [Propoxyphene N-Acetaminophen] Hives    Past Medical History  Diagnosis Date  . Diabetes mellitus without complication ([place])   . Hypertension   . Hyperlipidemia   . GERD (gastroesophageal reflux disease)   . Cancer [facility])     Prostate cancer age 27; Lupron treatments followed by prostatectomy.  . Myocardial infarction [facility])     age 39; no stenting.  . OSA (obstructive sleep apnea)     non-compliant with CPAP.    Past Surgical History  Procedure Laterality Date  . Cholecystectomy    . Prostate surgery      Family History  Problem Relation Age of Onset  . Cancer Mother     leukemia  . Diabetes Mother   . Cancer Father 66    lung cancer with mets  . Heart disease Maternal Grandmother   . Cancer Maternal Grandfather   . Heart disease Paternal Grandmother   . Cancer Paternal Grandfather     Social History:  reports that he has never smoked. His smokeless tobacco use includes Chew. He reports that he drinks alcohol. He reports that he does not use illicit drugs.   Review of Systems  Constitutional: Negative for weight loss.  HENT: Negative for headaches.   Eyes: Positive for blurred vision.  Respiratory: Positive for shortness of breath.        May get somewhat short of breath on walking up inclines  Cardiovascular: Negative for leg swelling.  Gastrointestinal: Negative for diarrhea.  Endocrine: Positive for fatigue.  Genitourinary: Positive for frequency.       Has urinary incontinence since prostate surgery.  Also had erectile dysfunction subsequently and was given a penile implant which is not effective now  Musculoskeletal: Positive for muscle cramps. Negative for joint pain and back pain.  Skin: Negative for itching.    Neurological: Positive for weakness and numbness.       He has had persistent  pain and lower legs and feet for about 8 years, worse on trying to walk.  Somewhat relieved by gabapentin 3 times a day. Has weakness in his legs, has difficulty climbing steps or latter at work and may at times fall  Psychiatric/Behavioral: Positive for insomnia.     Lipid history: Not on Rx, He thinks he got weak on Lipitor especially legs.    Lab Results  Component Value Date   CHOL 227* 08/04/2015   HDL 55 08/04/2015   LDLCALC 130* 08/04/2015   TRIG 209* 08/04/2015   CHOLHDL 4.1 08/04/2015           Hypertension: None  Most recent eye exam was About 8 years ago  Most recent foot exam: 6/17  He has been given B-12 by PCP   LABS:  No visits with results within 1 Week(s) from this visit. Latest known visit with results is:  Office Visit on 08/04/2015  Component Date Value Ref Range Status  . WBC 08/04/2015 7.6  4.6 - 10.2 K/uL Final  . Lymph, poc 08/04/2015 2.5  0.6 - 3.4 Final  . POC LYMPH PERCENT 08/04/2015 33.3  10 - 50 %L Final  . MID (cbc) 08/04/2015 0.6  0 - 0.9 Final  . POC MID % 08/04/2015 7.4  0 - 12 %M Final  . POC Granulocyte 08/04/2015 4.5  2 - 6.9 Final  . Granulocyte percent 08/04/2015 59.3  37 - 80 %G Final  . RBC 08/04/2015 4.90  4.69 - 6.13 M/uL Final  . Hemoglobin 08/04/2015 16.0  14.1 - 18.1 g/dL Final  . HCT, POC 08/04/2015 44.8  43.5 - 53.7 % Final  . MCV 08/04/2015 91.5  80 - 97 fL Final  . MCH, POC 08/04/2015 32.6* 27 - 31.2 pg Final  . MCHC 08/04/2015 35.6* 31.8 - 35.4 g/dL Final  . RDW, POC 08/04/2015 12.4   Final  . Platelet Count, POC 08/04/2015 159  142 - 424 K/uL Final  . MPV 08/04/2015 7.9  0 - 99.8 fL Final  . POC Glucose 08/04/2015 138* 70 - 99 mg/dl Final  . Hemoglobin A1C 08/04/2015 9.8   Final  . Sodium 08/04/2015 138  135 - 146 mmol/L Final  . Potassium 08/04/2015 4.4  3.5 - 5.3 mmol/L Final  . Chloride 08/04/2015 102  98 - 110 mmol/L Final  .  CO2 08/04/2015 23  20 - 31 mmol/L Final  . Glucose, Bld 08/04/2015 156* 65 - 99 mg/dL Final  . BUN 08/04/2015 13  7 - 25 mg/dL Final  . Creat 08/04/2015 0.86  0.70 - 1.25 mg/dL Final  . Total Bilirubin 08/04/2015 0.7  0.2 - 1.2 mg/dL Final  . Alkaline Phosphatase 08/04/2015 78  40 - 115 U/L Final  . AST 08/04/2015 38* 10 - 35 U/L Final  . ALT 08/04/2015 38  9 - 46 U/L Final  . Total Protein 08/04/2015 6.8  6.1 - 8.1 g/dL Final  . Albumin 08/04/2015 4.3  3.6 - 5.1 g/dL Final  . Calcium 08/04/2015 9.6  8.6 - 10.3 mg/dL Final  . GFR, Est African American 08/04/2015 >89  >=60 mL/min Final  . GFR, Est Non African American 08/04/2015 >89  >=60 mL/min Final   Comment:   The estimated GFR is a calculation valid for adults (>=64 years old) that uses the CKD-EPI algorithm to adjust for age and sex. It is   not to be used for children, pregnant women, hospitalized patients,    patients on dialysis, or with  rapidly changing kidney function. According to the NKDEP, eGFR >89 is normal, 60-89 shows mild impairment, 30-59 shows moderate impairment, 15-29 shows severe impairment and <15 is ESRD.     Marland Kitchen HCV Ab 08/04/2015 NEGATIVE  NEGATIVE Final  . PSA 08/04/2015 <0.01  <=4.00 ng/mL Final   Comment: Result repeated and verified. Test Methodology: ECLIA PSA (Electrochemiluminescence Immunoassay)   For PSA values from 2.5-4.0, particularly in younger men <77 years old, the AUA and NCCN suggest testing for % Free PSA (3515) and evaluation of the rate of increase in PSA (PSA velocity).   . Cholesterol 08/04/2015 227* 125 - 200 mg/dL Final  . Triglycerides 08/04/2015 209* <150 mg/dL Final  . HDL 08/04/2015 55  >=40 mg/dL Final  . Total CHOL/HDL Ratio 08/04/2015 4.1  <=5.0 Ratio Final  . VLDL 08/04/2015 42* <30 mg/dL Final  . LDL Cholesterol 08/04/2015 130* <130 mg/dL Final   Comment:   Total Cholesterol/HDL Ratio:CHD Risk                        Coronary Heart Disease Risk Table                                         Men       Women          1/2 Average Risk              3.4        3.3              Average Risk              5.0        4.4           2X Average Risk              9.6        7.1           3X Average Risk             23.4       11.0 Use the calculated Patient Ratio above and the CHD Risk table  to determine the patient's CHD Risk.   Jacquelyne Balint, Ur 08/04/2015 3.6  Not estab mg/dL Final   Comment: The ADA has defined abnormalities in albumin excretion as follows:           Category           Result                            (mcg/mg creatinine)                 Normal:    <30       Microalbuminuria:    30 - 299   Clinical albuminuria:    > or = 300   The ADA recommends that at least two of three specimens collected within a 3 - 6 month period be abnormal before considering a patient to be within a diagnostic category.       Physical Examination:  BP 126/64 mmHg  Pulse 74  Wt 242 lb (109.77 kg)  SpO2 97%  GENERAL:         Patient has generalized obesity.   HEENT:         Eye exam  shows normal external appearance. Fundus exam shows no retinopathy.  Oral exam shows normal mucosa .  NECK:   There is no lymphadenopathy Thyroid is not enlarged and no nodules felt.  Carotids are normal to palpation and no bruit heard LUNGS:         Chest is symmetrical. Lungs are clear to auscultation.Marland Kitchen   HEART:         Heart sounds:  S1 and S2 are normal. No murmur or click heard., no S3 or S4.   ABDOMEN:   There is no distention present. Liver and spleen are not palpable. No other mass or tenderness present.   NEUROLOGICAL:   Ankle jerks are absent bilaterally.    Diabetic Foot Exam - Simple   Simple Foot Form  Diabetic Foot exam was performed with the following findings:  Yes 08/24/2015  4:06 PM  Visual Inspection  No deformities, no ulcerations, no other skin breakdown bilaterally:  Yes  Sensation Testing  See comments:  Yes  Pulse Check  Posterior Tibialis and Dorsalis pulse  intact bilaterally:  Yes  Comments  Markedly decreased monofilament sensation in the toes             Vibration sense is Nearly absent in distal first toes. MUSCULOSKELETAL:  There is no swelling or deformity of the peripheral joints. Spine is normal to inspection.   EXTREMITIES:     There is no edema. No skin lesions present.Marland Kitchen SKIN:       No rash or lesions of concern.        ASSESSMENT:  Diabetes type 2, uncontrolled especially since 2016  He has been on metformin and Amaryl for several years and likely has gradual worsening of his diabetes with insulin deficiency Has had an adequate diabetes education His meal planning knowledge is minimal and he is interested in further education.  Currently not watching his diet with restricting drinks containing sugar or higher fat foods as well as erratic eating times  He may be getting some nausea with taking metformin in the morning without any food and not clear why he is taking more than 2000 mg a day Also his home glucose monitoring results are quite erratic, not clear if his meter is accurate but likely has more postprandial hyperglycemia Discussed importance of improving blood sugar control with more aggressive treatment especially with his significant and disabling neuropathy  Complications of diabetes: Peripheral neuropathy persistent lower leg pain, lower leg weakness and sensory loss distally, long-standing and gradually progressive. Currently being treated with gabapentin but only 300 mg 3 times a day, reportedly intolerant to Cymbalta.  Also has erectile dysfunction, unknown status of retinopathy.  He is overdue for eye exam  HYPERCHOLESTEROLEMIA: Currently untreated, patient reports muscle weakness with at least Lipitor May be able try alternative statins such as pravastatin or Livalo  PLAN:    He is a good candidate for adding a medication like Invokana.  Discussed action of SGLT 2 drugs on lowering glucose by decreasing kidney  absorption of glucose, benefits of weight loss and lower blood pressure, possible side effects including candidiasis and dosage regimen   He will start with 100 mg of Invokana in the mornings  He will take 1 g of metformin at lunch and also dinner, discussed need to limit the dose to 2000 mg a day and take metformin with meals  Reduce Amaryl to half tablet at dinnertime since he is eating smaller meals in the evening  He was started improving his diet  with eliminating drinks containing sugar, high-fat foods and excessive amounts of fruits  Recommended that he use a [employer] brand meter his insurance is not covering [employer] or [employer].  He will check his insurance benefits  Recommended he take up to 6 capsules of gabapentin at the as needed for neuropathic pain  Also recommended eye exam, he can do this when his blood sugars are consistently well controlled  Patient Instructions  Take Invokana in the morning  METFORMIN: Take 1000 mg at lunch and dinner  GLIMEPIRIDE: Take 1 tablet with lunch and half tablet at dinnertime  Check blood sugars on waking up 3  times a week Also check blood sugars about 2 hours after a meal and do this after different meals by rotation  Recommended blood sugar levels on waking up is 90-130 and about 2 hours after meal is 130-160  Please bring your blood sugar monitor to each visit, thank you  Please check with her insurance about which brand of glucose monitor   is covered: Prefer [employer] or [employer] Otherwise may get the [employer] brand  Reduce or eliminate drinks containing sugar, high-fat meats and fried food   Counseling time on subjects discussed above is over 50% of today's 60 minute visit   Addison Whidbee 08/24/2015, 5:08 PM   Note: This office note was prepared with Dragon voice recognition system technology. Any transcriptional errors that result from this process are unintentional.

## 2015-08-24 NOTE — Patient Instructions (Signed)
Take Invokana in the morning  METFORMIN: Take 1000 mg at lunch and dinner  GLIMEPIRIDE: Take 1 tablet with lunch and half tablet at dinnertime  Check blood sugars on waking up 3  times a week Also check blood sugars about 2 hours after a meal and do this after different meals by rotation  Recommended blood sugar levels on waking up is 90-130 and about 2 hours after meal is 130-160  Please bring your blood sugar monitor to each visit, thank you  Please check with her insurance about which brand of glucose monitor   is covered: Prefer [employer] or [employer] Otherwise may get the [employer] brand  Reduce or eliminate drinks containing sugar, high-fat meats and fried food

## 2015-08-24 NOTE — Progress Notes (Signed)
Brief Nutrition Note: Patient is here with his wife as a walk in.  The patient does most of the cooking.  They would like ideas for healthier cooking.  He is unable to exercise due to a bad knee and states that he got tired of water and resumed regular soda intake.  He states that his diet is not good and eats out a lot. They would like to return at a later time (one month when he is scheduled with Dr. Dwyane Dee) to have a full appointment.    I provided them a copy of My Plate and discussed briefly.  Discussed need to avoid regular soda and sugar sweetened drinks as well as decrease the fat content of his diet. Also gave them a copy of Tasty Recipes.   I will send them diabetes magazines as well for reference.    No charge for this appointment.  Antonieta Iba, RD, LDN

## 2015-08-27 ENCOUNTER — Telehealth: Payer: Self-pay | Admitting: Endocrinology

## 2015-08-27 MED ORDER — BAYER CONTOUR NEXT MONITOR W/DEVICE KIT: PACK | Status: AC

## 2015-08-27 MED ORDER — BAYER MICROLET LANCETS MISC: Status: AC

## 2015-08-27 MED ORDER — GLUCOSE BLOOD VI STRP: ORAL_STRIP | Status: DC

## 2015-08-27 NOTE — Telephone Encounter (Signed)
Rx submitted per pt's request.  

## 2015-08-27 NOTE — Telephone Encounter (Signed)
PT stated that he needs prescriptions for a meter, strips, and lancets (did not know what kind was needed) to [facility] [place], [place]

## 2015-09-28 ENCOUNTER — Encounter: Payer: Self-pay | Admitting: Endocrinology

## 2015-09-28 ENCOUNTER — Encounter: Payer: BLUE CROSS/BLUE SHIELD | Admitting: Dietician

## 2015-09-28 ENCOUNTER — Ambulatory Visit (INDEPENDENT_AMBULATORY_CARE_PROVIDER_SITE_OTHER): Payer: BLUE CROSS/BLUE SHIELD | Admitting: Endocrinology

## 2015-09-28 VITALS — BP 128/90 | HR 69 | Wt 237.0 lb

## 2015-09-28 DIAGNOSIS — E1165 Type 2 diabetes mellitus with hyperglycemia: Secondary | ICD-10-CM | POA: Diagnosis not present

## 2015-09-28 DIAGNOSIS — E1142 Type 2 diabetes mellitus with diabetic polyneuropathy: Secondary | ICD-10-CM

## 2015-09-28 LAB — BASIC METABOLIC PANEL
BUN: 13 mg/dL (ref 6–23)
CO2: 28 meq/L (ref 19–32)
Calcium: 9.2 mg/dL (ref 8.4–10.5)
Chloride: 105 mEq/L (ref 96–112)
Creatinine, Ser: 0.77 mg/dL (ref 0.40–1.50)
GFR: 108.56 mL/min (ref >60.00)
GLUCOSE: 59 mg/dL — AB (ref 70–99)
POTASSIUM: 3.9 meq/L (ref 3.5–5.1)
SODIUM: 140 meq/L (ref 135–145)

## 2015-09-28 NOTE — Progress Notes (Signed)
Patient ID: Clayton Bluitt., male   DOB: 1950-11-10, 65 y.o.   MRN: [id number]           Reason for Appointment: Follow-up for poorly controlled Type 2 Diabetes   History of Present Illness:          Date of diagnosis of type 2 diabetes mellitus:?  2002       Background history:   He thinks he had increased thirst at the time of diagnosis which was probably 12-14 years ago He was treated with metformin for several years Subsequently Amaryl was added and this has been continued Previous level of control is unknown, records available only since 2013  His blood sugars have been generally only and fair control with A1c usually over 7% and starting to increase in 11/2013  Recent history:     He has previously had poorly controlled diabetes and A1c consistently over 9% He was started on Invokana in addition to his metformin and Amaryl  Non-insulin hypoglycemic drugs the patient is taking are: Metformin ?  2 g twice a day.  Amaryl 4 mg twice a day  Current management, blood sugar patterns and problems identified:  He is now checking his blood sugar in the last couple of weeks with the contour meter instead of Generic  His blood sugars are progressively improved, previously frequently over 200  Although his blood sugars are relatively higher in the morning and occasionally as high as 273 he says that he sometimes will drink juice late at night or during the night.  Does not understand that juices do contain sugar  Blood sugars around suppertime are relatively more stable and averaging about 118  He has only a couple of readings later in the evening but usually is not eating much at suppertime  He is not able to do much activity because of neuropathy  His weight is better.  He thinks he is cutting back on high-fat foods  He was told to cut back his evening Amaryl to half tablet but he is still taking 4 mg twice a day          Side effects from medications have been:  None  Compliance with the medical regimen: Fair Hypoglycemia: None    Glucose monitoring:  done?  1-2  times a day         Glucometer: Contour Blood Glucose readings by   Mean values apply above for all meters except median for One Touch  PRE-MEAL Fasting Late afternoon  Evening  Bedtime Overall  Glucose range: 71-273  86-141  73-179     Mean/median: 161   130   137    Self-care: The diet that the patient has been following is: None reducing fats He is not reducing fruit juices such as grapefruit and cranberry    Meal times are:  Breakfast is usually skipped Typical meal intake: Meat and 2 vegetables at lunch, snacks or fruits at dinner              Dietician visit, most recent: Never               Exercise:  none except some walking at work, does walk relatively slowly  Weight history:  Wt Readings from Last 3 Encounters:  09/28/15 237 lb (107.5 kg)  08/24/15 242 lb (109.8 kg)  08/24/15 242 lb (109.8 kg)    Glycemic control:   Lab Results  Component Value Date   HGBA1C 9.8 08/04/2015  HGBA1C 10.0 11/04/2014   HGBA1C 9.1 05/05/2014   Lab Results  Component Value Date   MICROALBUR 3.6 08/04/2015   LDLCALC 130 (H) 08/04/2015   CREATININE 0.77 09/28/2015   No results found for: [facility]       Medication List       Accurate as of 09/28/15  1:00 PM. Always use your most recent med list.          aspirin 81 MG tablet Take 81 mg by mouth daily.   atorvastatin 20 MG tablet Commonly known as:  LIPITOR Take 1 tablet (20 mg total) by mouth daily.   azelastine 0.1 % nasal spray Commonly known as:  ASTELIN Place 2 sprays into both nostrils 2 (two) times daily. Use in each nostril as directed   BAYER CONTOUR NEXT MONITOR w/Device Kit Check blood sugar two times daily.   BAYER MICROLET LANCETS lancets Check blood sugar two times daily.   canagliflozin 100 MG Tabs tablet Commonly known as:  INVOKANA 1 tablet before breakfast   gabapentin 300 MG  capsule Commonly known as:  NEURONTIN Take 3 capsules (900 mg total) by mouth 3 (three) times daily.   glimepiride 4 MG tablet Commonly known as:  AMARYL TAKE 1 TABLET (4 MG TOTAL) BY MOUTH 2 (TWO) TIMES DAILY BEFORE A MEAL.   glucose blood test strip Commonly known as:  BAYER CONTOUR NEXT TEST Check blood sugar two times daily.   metFORMIN 1000 MG tablet Commonly known as:  GLUCOPHAGE TAKE 1 TABLET BY MOUTH TWO TIMES A DAY.   omeprazole 20 MG capsule Commonly known as:  PRILOSEC Take 1 capsule (20 mg total) by mouth daily.   vitamin B-12 1000 MCG tablet Commonly known as:  CYANOCOBALAMIN Take 1,000 mcg by mouth daily.       Allergies:  Allergies  Allergen Reactions  . Cymbalta [Duloxetine Hcl] Nausea And Vomiting  . Darvocet [Propoxyphene N-Acetaminophen] Hives    Past Medical History:  Diagnosis Date  . Cancer [facility])    Prostate cancer age 50; Lupron treatments followed by prostatectomy.  . Diabetes mellitus without complication ([place])   . GERD (gastroesophageal reflux disease)   . Hyperlipidemia   . Hypertension   . Myocardial infarction [facility])    age 76; no stenting.  . OSA (obstructive sleep apnea)    non-compliant with CPAP.    Past Surgical History:  Procedure Laterality Date  . CHOLECYSTECTOMY    . PROSTATE SURGERY      Family History  Problem Relation Age of Onset  . Cancer Mother     leukemia  . Diabetes Mother   . Cancer Father 25    lung cancer with mets  . Heart disease Maternal Grandmother   . Cancer Maternal Grandfather   . Heart disease Paternal Grandmother   . Cancer Paternal Grandfather     Social History:  reports that he has never smoked. His smokeless tobacco use includes Chew. He reports that he drinks alcohol. He reports that he does not use drugs.   Review of Systems   Lipid history: Not on Rx, He thinks he got weak on Lipitor especially legs.    Lab Results  Component Value Date   CHOL 227 (H) 08/04/2015   HDL 55  08/04/2015   LDLCALC 130 (H) 08/04/2015   TRIG 209 (H) 08/04/2015   CHOLHDL 4.1 08/04/2015           Hypertension: None  Most recent eye exam was About 8 years ago  NEUROPATHY: He says his feet are not hurting as much and has been able to reduce the amount of gabapentin he is taking, previously on 900 mg at a time and now taking 600 mg Most recent foot exam: 6/17  He has been given B-12 by PCP     Physical Examination:  BP 128/90 (BP Location: Left Arm, Patient Position: Sitting)   Pulse 69   Wt 237 lb (107.5 kg)   SpO2 98%   BMI 35.00 kg/m     ASSESSMENT:  Diabetes type 2, uncontrolled especially since 2016 See history of present illness for detailed discussion of current diabetes management, blood sugar patterns and problems identified With starting Invokana his blood sugars appear to be significantly better at home His blood sugars in the mornings are relatively higher and somewhat inconsistent, probably based on his meals and snacks at night as well as drinking juices Even with Amaryl 4 mg twice a day he is not getting hypoglycemia currently Subjectively feeling significantly better He is also trying to improve his diet generally Not able to exercise much Is able to start losing weight with Invokana  Neuropathy: Subjectively doing better with improved diabetes control  PLAN:     Cut back on juices.  Avoid drinks with sugar content over 10 g per serving  Reduce Amaryl to half tablet in the morning  Schedule exam for retinopathy screening  Follow-up with PCP regarding hyperlipidemia.  May consider Livalo or Lescol for decreased muscle side effects  Take gabapentin as needed  Will continue to monitor blood pressure  Follow-up labs today  Patient Instructions  Check blood sugars on waking up 3-4  times a week Also check blood sugars about 2 hours after a meal and do this after different meals by rotation  Recommended blood sugar levels on waking up is  90-130 and about 2 hours after meal is 130-160  Please bring your blood sugar monitor to each visit, thank you  Cut Glimeperide to 1/2 in am  Reduce juices   Counseling time on subjects discussed above is over 50% of today's 25 minute visit   Zailee Vallely 09/28/2015, 1:00 PM   Note: This office note was prepared with Estate agent. Any transcriptional errors that result from this process are unintentional.  Addendum: Glucose was 59 in the lab, he will reduce morning Amaryl to half tablet as directed, may need to reduce further

## 2015-09-28 NOTE — Patient Instructions (Addendum)
Check blood sugars on waking up 3-4  times a week Also check blood sugars about 2 hours after a meal and do this after different meals by rotation  Recommended blood sugar levels on waking up is 90-130 and about 2 hours after meal is 130-160  Please bring your blood sugar monitor to each visit, thank you  Cut Glimeperide to 1/2 in am  Reduce juices

## 2015-09-29 LAB — FRUCTOSAMINE: Fructosamine: 232 umol/L (ref 0–285)

## 2015-10-07 NOTE — Progress Notes (Signed)
Please let patient know that the overall control is good, kidney function normal

## 2015-10-08 ENCOUNTER — Telehealth: Payer: Self-pay

## 2015-10-08 NOTE — Telephone Encounter (Signed)
Called and left voicemail for patient to return call for lab results, gave call back number.

## 2015-11-16 DIAGNOSIS — Z23 Encounter for immunization: Secondary | ICD-10-CM | POA: Diagnosis not present

## 2015-11-23 ENCOUNTER — Encounter: Payer: Self-pay | Admitting: *Deleted

## 2015-11-23 ENCOUNTER — Ambulatory Visit: Payer: BLUE CROSS/BLUE SHIELD | Admitting: Endocrinology

## 2015-11-23 DIAGNOSIS — Z0289 Encounter for other administrative examinations: Secondary | ICD-10-CM

## 2015-11-28 ENCOUNTER — Ambulatory Visit (INDEPENDENT_AMBULATORY_CARE_PROVIDER_SITE_OTHER): Payer: BLUE CROSS/BLUE SHIELD | Admitting: Endocrinology

## 2015-11-28 ENCOUNTER — Encounter: Payer: Self-pay | Admitting: Endocrinology

## 2015-11-28 VITALS — BP 138/82 | HR 78 | Resp 16 | Ht 69.0 in | Wt 238.2 lb

## 2015-11-28 DIAGNOSIS — E1165 Type 2 diabetes mellitus with hyperglycemia: Secondary | ICD-10-CM

## 2015-11-28 DIAGNOSIS — E119 Type 2 diabetes mellitus without complications: Secondary | ICD-10-CM

## 2015-11-28 DIAGNOSIS — I10 Essential (primary) hypertension: Secondary | ICD-10-CM

## 2015-11-28 DIAGNOSIS — E118 Type 2 diabetes mellitus with unspecified complications: Secondary | ICD-10-CM | POA: Diagnosis not present

## 2015-11-28 DIAGNOSIS — E782 Mixed hyperlipidemia: Secondary | ICD-10-CM | POA: Diagnosis not present

## 2015-11-28 LAB — POCT GLYCOSYLATED HEMOGLOBIN (HGB A1C): HEMOGLOBIN A1C: 5.7

## 2015-11-28 MED ORDER — FLUVASTATIN SODIUM 20 MG PO CAPS: 20.0000 mg | ORAL_CAPSULE | Freq: Every day | ORAL | 2 refills | Status: DC

## 2015-11-28 NOTE — Patient Instructions (Signed)
See Eye Dr  [facility]  Check blood sugars on waking up 2-3 x per week   Also check blood sugars about 2 hours after a meal and do this after different meals by rotation  Recommended blood sugar levels on waking up is 90-130 and about 2 hours after meal is 130-160  Please bring your blood sugar monitor to each visit, thank you

## 2015-11-28 NOTE — Progress Notes (Signed)
Patient ID: [name], male   DOB: 1951/01/22, 65 y.o.   MRN: [id number]           Reason for Appointment: Follow-up for poorly controlled Type 2 Diabetes   History of Present Illness:          Date of diagnosis of type 2 diabetes mellitus:?  2002       Background history:   He thinks he had increased thirst at the time of diagnosis which was probably 12-14 years ago He was treated with metformin for several years Subsequently Amaryl was added and this has been continued Previous level of control is unknown, records available only since 2013  His blood sugars have been generally only and fair control with A1c usually over 7% and starting to increase in 11/2013  Recent history:     He has previously had poorly controlled diabetes and A1c consistently over 9% He was started on Invokana in addition to his metformin and Amaryl  Non-insulin hypoglycemic drugs the patient is taking are: Metformin 1 g twice a day.  Amaryl 2 mg a.m. and 4 mg p.m.  Current management, blood sugar patterns and problems identified:  He did not bring his monitor for download  His Amaryl was reduced in the morning since his lab glucose had shown a low sugar of 59  He is checking blood sugars only before breakfast and supper and not after meals as directed He thinks that sometimes his sugars are higher in the morning if he is eating late at night or during the night, occasionally still drinking some juice  A1c is excellent at 5.7, lower than expected for his readings  No recent change in his weight  Overall he thinks he is trying to improve his diet  His activity level is variable and usually not able to do was because of his busy work schedule        Side effects from medications have been: None  Compliance with the medical regimen: Fair Hypoglycemia: None    Glucose monitoring:  done?  1-2  times a day         Glucometer: Contour Blood Glucose readings by recall:  Mean values apply  above for all meters except median for One Touch  PRE-MEAL Fasting Lunch Dinner Bedtime Overall  Glucose range: 97-187  98-160    Mean/median:        Self-care: The diet that the patient has been following is: None reducing fats He is reducing fruit juices such as grapefruit and cranberry    Meal times are:  Breakfast is usually skipped Typical meal intake: Meat and 2 vegetables at lunch, snacks or fruits at dinner              Dietician visit, most recent: Never               Exercise:  none except some walking at work, does walk relatively slowly  Weight history:  Wt Readings from Last 3 Encounters:  11/28/15 238 lb 3.2 oz (108 kg)  09/28/15 237 lb (107.5 kg)  08/24/15 242 lb (109.8 kg)    Glycemic control:   Lab Results  Component Value Date   HGBA1C 5.7 11/28/2015   HGBA1C 9.8 08/04/2015   HGBA1C 10.0 11/04/2014   Lab Results  Component Value Date   MICROALBUR 3.6 08/04/2015   LDLCALC 130 (H) 08/04/2015   CREATININE 0.77 09/28/2015   No results found for: MICRALBCREAT  Medication List       Accurate as of 11/28/15 11:59 PM. Always use your most recent med list.          amoxicillin-clavulanate 875-125 MG tablet Commonly known as:  AUGMENTIN   aspirin 81 MG tablet Take 81 mg by mouth daily.   atorvastatin 20 MG tablet Commonly known as:  LIPITOR Take 1 tablet (20 mg total) by mouth daily.   azelastine 0.1 % nasal spray Commonly known as:  ASTELIN Place 2 sprays into both nostrils 2 (two) times daily. Use in each nostril as directed   BAYER CONTOUR NEXT MONITOR w/Device Kit Check blood sugar two times daily.   BAYER MICROLET LANCETS lancets Check blood sugar two times daily.   canagliflozin 100 MG Tabs tablet Commonly known as:  INVOKANA 1 tablet before breakfast   FLUARIX QUADRIVALENT 0.5 ML injection Generic drug:  Influenza vac split quadrivalent PF TO BE ADMINISTERED BY PHARMACIST FOR IMMUNIZATION   fluvastatin 20 MG  capsule Commonly known as:  LESCOL Take 1 capsule (20 mg total) by mouth at bedtime.   gabapentin 300 MG capsule Commonly known as:  NEURONTIN Take 3 capsules (900 mg total) by mouth 3 (three) times daily.   glimepiride 4 MG tablet Commonly known as:  AMARYL TAKE 1 TABLET (4 MG TOTAL) BY MOUTH 2 (TWO) TIMES DAILY BEFORE A MEAL.   glucose blood test strip Commonly known as:  BAYER CONTOUR NEXT TEST Check blood sugar two times daily.   metFORMIN 1000 MG tablet Commonly known as:  GLUCOPHAGE TAKE 1 TABLET BY MOUTH TWO TIMES A DAY.   omeprazole 20 MG capsule Commonly known as:  PRILOSEC Take 1 capsule (20 mg total) by mouth daily.   silver sulfADIAZINE 1 % cream Commonly known as:  SILVADENE   vitamin B-12 1000 MCG tablet Commonly known as:  CYANOCOBALAMIN Take 1,000 mcg by mouth daily.       Allergies:  Allergies  Allergen Reactions  . Cymbalta [Duloxetine Hcl] Nausea And Vomiting  . Darvocet [Propoxyphene N-Acetaminophen] Hives    Past Medical History:  Diagnosis Date  . Cancer [facility])    Prostate cancer age 58; Lupron treatments followed by prostatectomy.  . Diabetes mellitus without complication ([place])   . GERD (gastroesophageal reflux disease)   . Hyperlipidemia   . Hypertension   . Myocardial infarction [facility])    age 54; no stenting.  . OSA (obstructive sleep apnea)    non-compliant with CPAP.    Past Surgical History:  Procedure Laterality Date  . CHOLECYSTECTOMY    . PROSTATE SURGERY      Family History  Problem Relation Age of Onset  . Cancer Mother     leukemia  . Diabetes Mother   . Cancer Father 37    lung cancer with mets  . Heart disease Maternal Grandmother   . Cancer Maternal Grandfather   . Heart disease Paternal Grandmother   . Cancer Paternal Grandfather     Social History:  reports that he has never smoked. His smokeless tobacco use includes Chew. He reports that he drinks alcohol. He reports that he does not use drugs.   Review  of Systems   Lipid history: Not on Rx, He thinks he got weak on Lipitor especially legs.    Lab Results  Component Value Date   CHOL 227 (H) 08/04/2015   HDL 55 08/04/2015   LDLCALC 130 (H) 08/04/2015   TRIG 209 (H) 08/04/2015   CHOLHDL 4.1 08/04/2015  Most recent eye exam was 8 years ago  NEUROPATHY: He Is now taking 600 mg with variable relief of his symptoms Most recent foot exam: 6/17  He has been given B-12 by PCP   He recently had a blister on his foot and was treated at the urgent care.  Also given Augmentin  Physical Examination:  BP 138/82   Pulse 78   Resp 16   Ht '5\' 9"'  (1.753 m)   Wt 238 lb 3.2 oz (108 kg)   SpO2 96%   BMI 35.18 kg/m    Left foot exam shows no signs of cellulitis, has old flattened blister present on the medial anterior surface under the large metatarsal head   ASSESSMENT:  Diabetes type 2,   See history of present illness for detailed discussion of current diabetes management, blood sugar patterns and problems identified  With starting Invokana his blood sugars appear to be significantly better  A1c is 5.7 now Although initially had lost weight this is leveled off now  His blood sugars tend to be a little inconsistent, probably dependent on his compliance with diet  HYPERLIPIDEMIA: He has had intolerance to at least Lipitor and we'll need another medication  PLAN:     No change in medications  Continue to improve diet including cutting back on juices  Schedule exam for retinopathy screening  Trial of Lescol for hypercholesterolemia, may tolerate this better   Patient Instructions  See Eye Dr  [facility]  Check blood sugars on waking up 2-3 x per week   Also check blood sugars about 2 hours after a meal and do this after different meals by rotation  Recommended blood sugar levels on waking up is 90-130 and about 2 hours after meal is 130-160  Please bring your blood sugar monitor to each visit,  thank you      [facility] 11/29/2015, 12:46 PM   Note: This office note was prepared with Dragon voice recognition system technology. Any transcriptional errors that result from this process are unintentional.

## 2015-11-29 ENCOUNTER — Ambulatory Visit (INDEPENDENT_AMBULATORY_CARE_PROVIDER_SITE_OTHER): Payer: BLUE CROSS/BLUE SHIELD | Admitting: Podiatry

## 2015-11-29 ENCOUNTER — Encounter: Payer: Self-pay | Admitting: Podiatry

## 2015-11-29 VITALS — BP 142/81 | HR 82 | Resp 16 | Ht 69.0 in | Wt 232.0 lb

## 2015-11-29 DIAGNOSIS — S90822A Blister (nonthermal), left foot, initial encounter: Secondary | ICD-10-CM

## 2015-11-29 DIAGNOSIS — L089 Local infection of the skin and subcutaneous tissue, unspecified: Secondary | ICD-10-CM

## 2015-11-29 DIAGNOSIS — E1149 Type 2 diabetes mellitus with other diabetic neurological complication: Secondary | ICD-10-CM | POA: Diagnosis not present

## 2015-11-29 DIAGNOSIS — L84 Corns and callosities: Secondary | ICD-10-CM

## 2015-11-29 DIAGNOSIS — M2012 Hallux valgus (acquired), left foot: Secondary | ICD-10-CM | POA: Diagnosis not present

## 2015-11-29 NOTE — Progress Notes (Signed)
   Subjective:    Patient ID: [name], male    DOB: [birth year], 65 y.o.   MRN: [id number]  HPI this patient presents the office with chief complaint of an infected blister under the ball of the big toe left foot. He says he was seen by his doctor last Friday, who aspirated the blister and then placed him on antibiotics. He then was given an appointment for my my office for evaluation and treatment of this infected blister, left foot. This patient is a diabetic with severe neuropathy. He states he is having difficulty taking the proper amount of medication for the neuropathy. He presents the office today for an evaluation and treatment of his left foot    Review of Systems  All other systems reviewed and are negative.      Objective:   Physical Exam GENERAL APPEARANCE: Alert, conversant. Appropriately groomed. No acute distress.  VASCULAR: Pedal pulses are  palpable at  [facility] and PT bilateral.  Capillary refill time is immediate to all digits,  Normal temperature gradient.   NEUROLOGIC: sensation is absent to 5.07 monofilament at 5/5 sites bilateral.  Light touch is intact bilateral, Muscle strength normal.  MUSCULOSKELETAL: acceptable muscle strength, tone and stability bilateral.  Intrinsic muscluature intact bilateral.  Rectus appearance of foot and digits noted bilateral.   DERMATOLOGIC: skin color, texture, and turgor are within normal limits.  No preulcerative lesions or ulcers  are seen, no interdigital maceration noted.  No open lesions present.  Digital nails are asymptomatic. No drainage noted. Callus sub 1 left foot with mild redness noted.  No evidence of active infection         Assessment & Plan:  Infected blister  Diabetic neuropathy with HAV  B/L.  IE  Debride callus.  Initiate diabetic shoes paperwork for DPN and HAV  B/L.  RTC prn . Dispersion padding was applied both to his foot and given to him for him to apply to his work shoes. I told him he could return to  work as well as not doing any  ladder climbing for 3 weeks.  RTC 3 months.   Gardiner Barefoot DPM

## 2015-12-12 ENCOUNTER — Other Ambulatory Visit: Payer: Self-pay | Admitting: Endocrinology

## 2016-02-15 ENCOUNTER — Ambulatory Visit: Payer: BLUE CROSS/BLUE SHIELD | Admitting: Podiatry

## 2016-02-16 ENCOUNTER — Ambulatory Visit (INDEPENDENT_AMBULATORY_CARE_PROVIDER_SITE_OTHER): Payer: BLUE CROSS/BLUE SHIELD | Admitting: Physician Assistant

## 2016-02-16 VITALS — BP 140/80 | HR 97 | Temp 98.1°F | Resp 18 | Ht 69.0 in | Wt 247.0 lb

## 2016-02-16 DIAGNOSIS — H6123 Impacted cerumen, bilateral: Secondary | ICD-10-CM

## 2016-02-16 MED ORDER — CARBAMIDE PEROXIDE 6.5 % OT SOLN: 5.0000 [drp] | Freq: Two times a day (BID) | OTIC | 0 refills | Status: DC

## 2016-02-16 MED ORDER — CETIRIZINE HCL 10 MG PO TABS: 10.0000 mg | ORAL_TABLET | Freq: Every day | ORAL | 11 refills | Status: DC

## 2016-02-16 NOTE — Patient Instructions (Addendum)
  Let's use these drops to break down the wax.  A lot of times it will come out on its own, however return in 1-2 weeks if you continue to have the problem and we can irrigate it out more readily.  Use as prescribed. Please use the zyrtec as prescribed.  Earwax Buildup Your ears make a substance called earwax. It may also be called cerumen. Sometimes, too much earwax builds up in your ear canal. This can cause ear pain and make it harder for you to hear. CAUSES This condition is caused by too much earwax production or buildup. RISK FACTORS The following factors may make you more likely to develop this condition:  Cleaning your ears often with swabs.  Having narrow ear canals.  Having earwax that is overly thick or sticky.  Having eczema.  Being dehydrated. SYMPTOMS Symptoms of this condition include:  Reduced hearing.  Ear drainage.  Ear pain.  Ear itch.  A feeling of fullness in the ear or feeling that the ear is plugged.  Ringing in the ear.  Coughing. DIAGNOSIS Your health care provider can diagnose this condition based on your symptoms and medical history. Your health care provider will also do an ear exam to look inside your ear with a scope (otoscope). You may also have a hearing test. TREATMENT Treatment for this condition includes:  Over-the-counter or prescription ear drops to soften the earwax.  Earwax removal by a health care provider. This may be done:  By flushing the ear with body-temperature water.  With a medical instrument that has a loop at the end (earwax curette).  With a suction device. HOME CARE INSTRUCTIONS  Take over-the-counter and prescription medicines only as told by your health care provider.  Do not put any objects, including an ear swab, into your ear. You can clean the opening of your ear canal with a washcloth.  Drink enough water to keep your urine clear or pale yellow.  If you have frequent earwax buildup or you use hearing  aids, consider seeing your health care provider every 6-12 months for routine preventive ear cleanings. Keep all follow-up visits as told by your health care provider. SEEK MEDICAL CARE IF:  You have ear pain.  Your condition does not improve with treatment.  You have hearing loss.  You have blood, pus, or other fluid coming from your ear. This information is not intended to replace advice given to you by your health care provider. Make sure you discuss any questions you have with your health care provider. Document Released: 03/27/2004 Document Revised: 06/11/2015 Document Reviewed: 10/04/2014 Elsevier Interactive Patient Education  2017 [employer].     IF you received an x-ray today, you will receive an invoice from [facility]. Please contact [facility] at [phone] with questions or concerns regarding your invoice.   IF you received labwork today, you will receive an invoice from [facility]. Please contact [facility] at [phone] with questions or concerns regarding your invoice.   Our billing staff will not be able to assist you with questions regarding bills from these companies.  You will be contacted with the lab results as soon as they are available. The fastest way to get your results is to activate your My Chart account. Instructions are located on the last page of this paperwork. If you have not heard from [place] regarding the results in 2 weeks, please contact this office.

## 2016-02-16 NOTE — Progress Notes (Signed)
[facility] [address], [place] [address] [phone]  Date:  02/16/2016   Name:  [name]   DOB:  [birth year]   MRN:  [id number]  PCP:  Ruben Reason, MD    History of Present Illness:  [name] is a 65 y.o. male patient who presents to [facility] for cc of weeks of hearing loss and minimal earpain of both ears with moreso at the right side.  No dizziness. He attempted to light a wic to melt the ear wax.  Patient Active Problem List   Diagnosis Date Noted  . Alcohol abuse 12/18/2014  . DOE (dyspnea on exertion) 07/18/2014  . Bifascicular block 07/18/2014  . OSA (obstructive sleep apnea) 05/21/2014  . Neuropathy ([place]) 05/05/2014  . DM (diabetes mellitus) ([place]) 04/27/2012    Past Medical History:  Diagnosis Date  . Cancer [facility])    Prostate cancer age 58; Lupron treatments followed by prostatectomy.  . Diabetes mellitus without complication ([place])   . GERD (gastroesophageal reflux disease)   . Hyperlipidemia   . Hypertension   . Myocardial infarction    age 43; no stenting.  . OSA (obstructive sleep apnea)    non-compliant with CPAP.    Past Surgical History:  Procedure Laterality Date  . CHOLECYSTECTOMY    . PROSTATE SURGERY      Social History  Substance Use Topics  . Smoking status: Never Smoker  . Smokeless tobacco: Current User    Types: Chew  . Alcohol use 0.0 oz/week     Comment: social    Family History  Problem Relation Age of Onset  . Cancer Mother     leukemia  . Diabetes Mother   . Cancer Father 23    lung cancer with mets  . Heart disease Maternal Grandmother   . Cancer Maternal Grandfather   . Heart disease Paternal Grandmother   . Cancer Paternal Grandfather     Allergies  Allergen Reactions  . Cymbalta [Duloxetine Hcl] Nausea And Vomiting  . Darvocet [Propoxyphene N-Acetaminophen] Hives    Medication list has been reviewed and updated.  Current Outpatient Prescriptions on File Prior to  Visit  Medication Sig Dispense Refill  . aspirin 81 MG tablet Take 81 mg by mouth daily.    Marland Kitchen azelastine (ASTELIN) 0.1 % nasal spray Place 2 sprays into both nostrils 2 (two) times daily. Use in each nostril as directed 30 mL 11  . BAYER MICROLET LANCETS lancets Check blood sugar two times daily. 100 each 5  . Blood Glucose Monitoring Suppl (BAYER CONTOUR NEXT MONITOR) w/Device KIT Check blood sugar two times daily. 1 kit 2  . fluvastatin (LESCOL) 20 MG capsule Take 1 capsule (20 mg total) by mouth at bedtime. 30 capsule 2  . gabapentin (NEURONTIN) 300 MG capsule Take 3 capsules (900 mg total) by mouth 3 (three) times daily. 270 capsule 3  . glimepiride (AMARYL) 4 MG tablet TAKE 1 TABLET (4 MG TOTAL) BY MOUTH 2 (TWO) TIMES DAILY BEFORE A MEAL. 180 tablet 3  . glucose blood (BAYER CONTOUR NEXT TEST) test strip Check blood sugar two times daily. 100 each 5  . INVOKANA 100 MG TABS tablet 1 TABLET BEFORE BREAKFAST 30 tablet 3  . metFORMIN (GLUCOPHAGE) 1000 MG tablet TAKE 1 TABLET BY MOUTH TWO TIMES A DAY. (Patient taking differently: 1,000 mg. TAKE 1 TABLET BY MOUTH TWO TIMES A DAY. Some days 3 tabs twice daily) 180 tablet 3  . omeprazole (  PRILOSEC) 20 MG capsule Take 1 capsule (20 mg total) by mouth daily. 90 capsule 3  . vitamin B-12 (CYANOCOBALAMIN) 1000 MCG tablet Take 1,000 mcg by mouth daily.    Marland Kitchen atorvastatin (LIPITOR) 20 MG tablet Take 1 tablet (20 mg total) by mouth daily. (Patient not taking: Reported on 02/16/2016) 90 tablet 3  . FLUARIX QUADRIVALENT 0.5 ML injection TO BE ADMINISTERED BY PHARMACIST FOR IMMUNIZATION  0   No current facility-administered medications on file prior to visit.     ROS ROS otherwise unremarkable unless listed above.  Physical Examination: BP 140/80 (BP Location: Right Arm, Patient Position: Sitting, Cuff Size: Large)   Pulse 97   Temp 98.1 F (36.7 C) (Oral)   Resp 18   Ht _0  (1.753 m)   Wt 247 lb (112 kg)   SpO2 96%   BMI 36.48 kg/m  Ideal Body  Weight: Weight in (lb) to have BMI = 25: 168.9  Physical Exam  Constitutional: He is oriented to person, place, and time. He appears well-developed and well-nourished. No distress.  HENT:  Head: Normocephalic and atraumatic.  Right Ear: External ear normal.  Left Ear: External ear normal.  Bilateral cerumen impaction blocking tympanic membrane.  Eyes: Conjunctivae and EOM are normal. Pupils are equal, round, and reactive to light.  Cardiovascular: Normal rate.   Pulmonary/Chest: Effort normal. No respiratory distress.  Neurological: He is alert and oriented to person, place, and time.  Skin: Skin is warm and dry. He is not diaphoretic.  Psychiatric: He has a normal mood and affect. His behavior is normal.     Assessment and Plan: [name] is a 65 y.o. male who is here today for hearing loss. Several lavages without clearance.  He was given debrox today.   Bilateral impacted cerumen - Plan: carbamide peroxide (DEBROX) 6.5 % otic solution    Ivar Drape, PA-C [facility] 02/16/2016 9:37 AM

## 2016-03-01 ENCOUNTER — Encounter: Payer: Self-pay | Admitting: Physician Assistant

## 2016-03-01 ENCOUNTER — Ambulatory Visit (INDEPENDENT_AMBULATORY_CARE_PROVIDER_SITE_OTHER): Payer: BLUE CROSS/BLUE SHIELD | Admitting: Physician Assistant

## 2016-03-01 VITALS — BP 124/80 | HR 94 | Temp 98.7°F | Resp 17 | Ht 70.0 in | Wt 249.0 lb

## 2016-03-01 DIAGNOSIS — E1142 Type 2 diabetes mellitus with diabetic polyneuropathy: Secondary | ICD-10-CM | POA: Diagnosis not present

## 2016-03-01 DIAGNOSIS — H6123 Impacted cerumen, bilateral: Secondary | ICD-10-CM

## 2016-03-01 DIAGNOSIS — S91301A Unspecified open wound, right foot, initial encounter: Secondary | ICD-10-CM

## 2016-03-01 DIAGNOSIS — H9202 Otalgia, left ear: Secondary | ICD-10-CM | POA: Diagnosis not present

## 2016-03-01 MED ORDER — AMOXICILLIN-POT CLAVULANATE 875-125 MG PO TABS: 1.0000 | ORAL_TABLET | Freq: Two times a day (BID) | ORAL | 0 refills | Status: AC
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
## 2016-03-01 NOTE — Patient Instructions (Addendum)
Once you have completed the antibiotic, return for repeat irrigation of the LEFT ear. In the meantime, continue using the drops to soften the wax, but do not insert anything into the ear canal.    IF you received an x-ray today, you will receive an invoice from [facility]. Please contact [facility] at [phone] with questions or concerns regarding your invoice.   IF you received labwork today, you will receive an invoice from [facility]. Please contact [facility] at [phone] with questions or concerns regarding your invoice.   Our billing staff will not be able to assist you with questions regarding bills from these companies.  You will be contacted with the lab results as soon as they are available. The fastest way to get your results is to activate your My Chart account. Instructions are located on the last page of this paperwork. If you have not heard from [place] regarding the results in 2 weeks, please contact this office.

## 2016-03-06 IMAGING — CT CT CHEST W/O CM
1 of 4 series · 15 of 30 positions shown, 19 images · non-contrast
Comparison: Chest radiograph- 05/05/2014; CT abdomen pelvis -
09/14/2009

CLINICAL DATA: Asbestos exposure for 40 years. History of prostate
cancer. Shortness of breath for the past 3-6 months. Chest pain.

EXAM:
CT CHEST WITHOUT CONTRAST
TECHNIQUE: Multidetector CT imaging of the chest was performed following the
standard protocol without IV contrast..

[Series 2: thin/ super d · axial · 0.79mm/px · z∈[-302,+1]mm · 15 of 347 slices shown, 19 images]
[im 22/347  mediastinal]
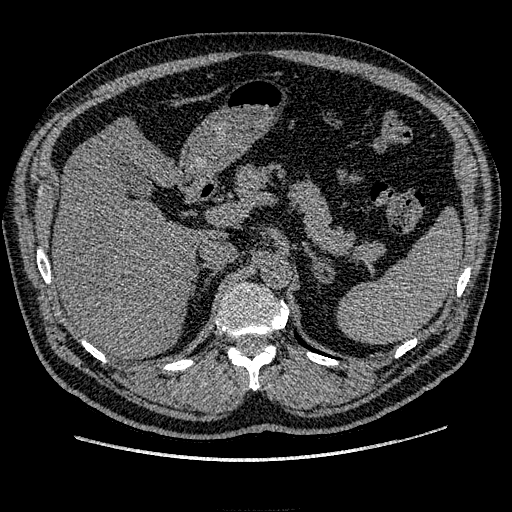
[im 22/347  lung]
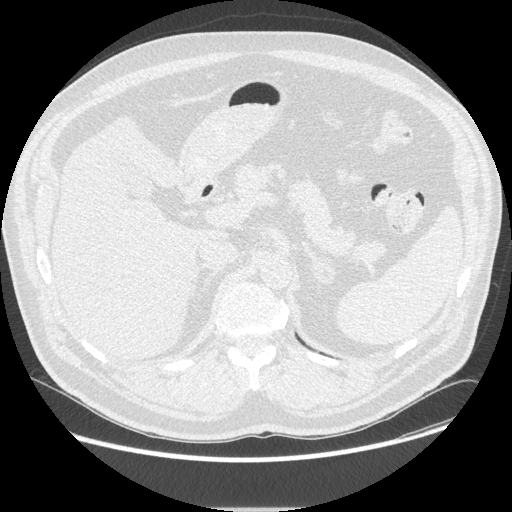
[im 44/347  lung]
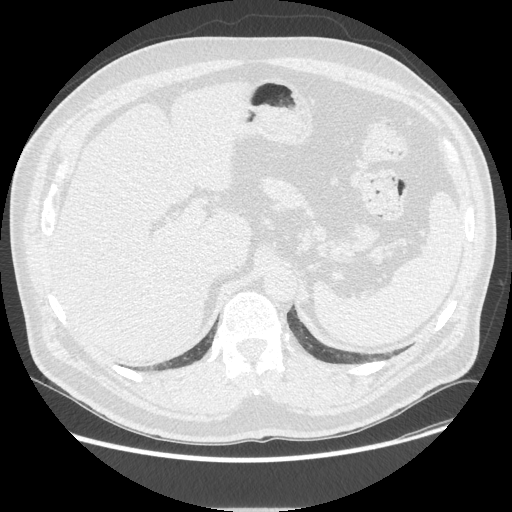
[im 65/347  lung]
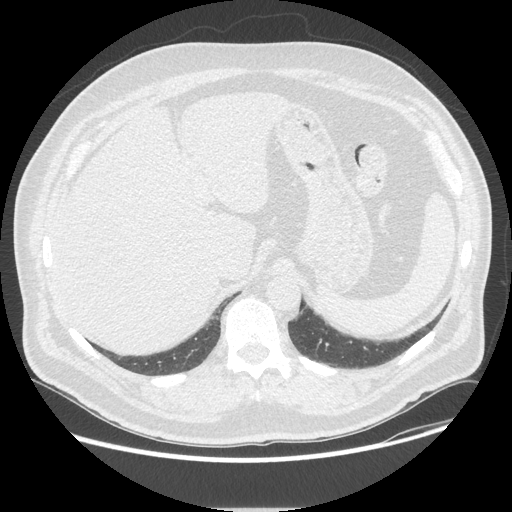
[im 87/347  lung]
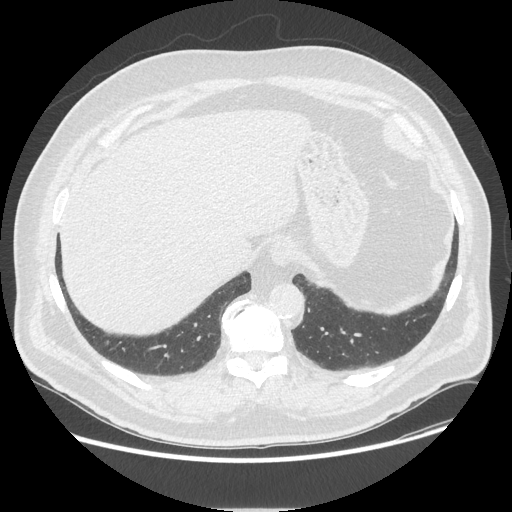
[im 109/347  mediastinal]
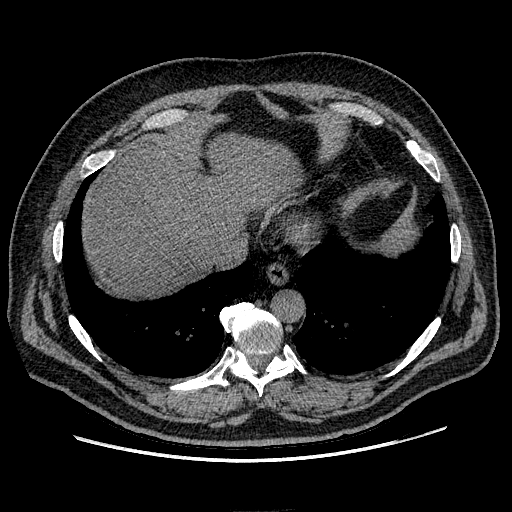
[im 109/347  lung]
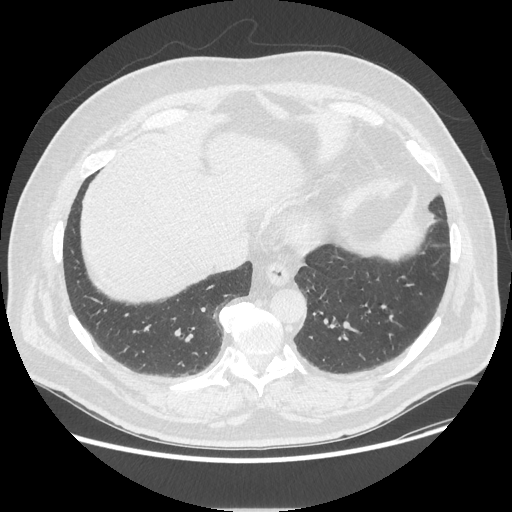
[im 130/347  lung]
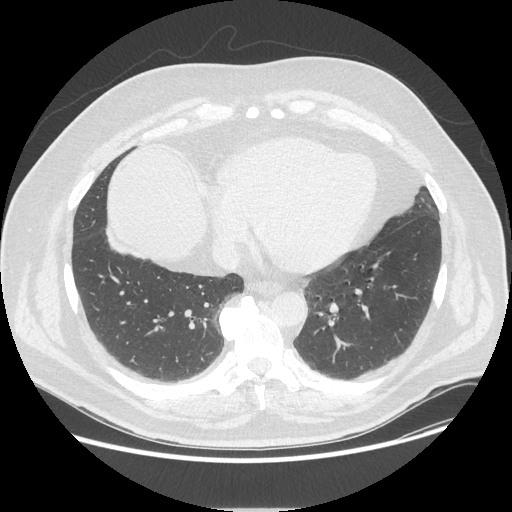
[im 152/347  lung]
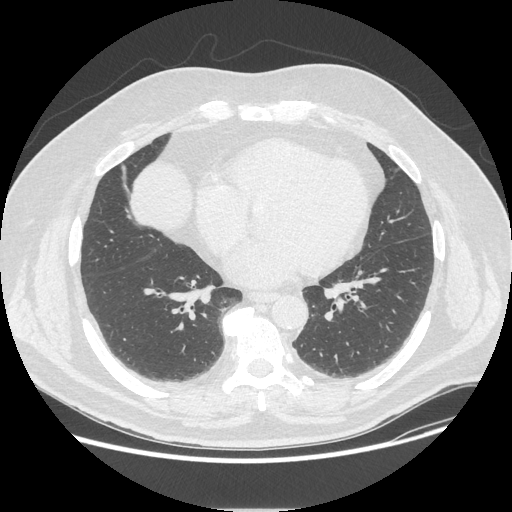
[im 174/347  lung]
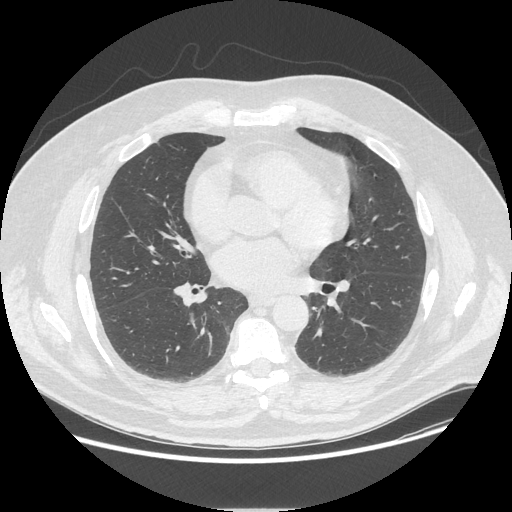
[im 195/347  mediastinal]
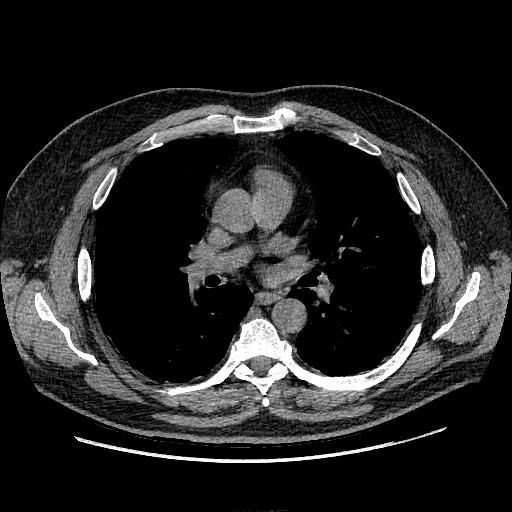
[im 195/347  lung]
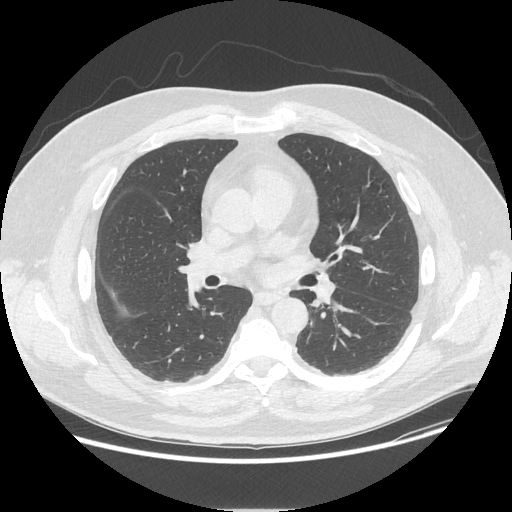
[im 217/347  lung]
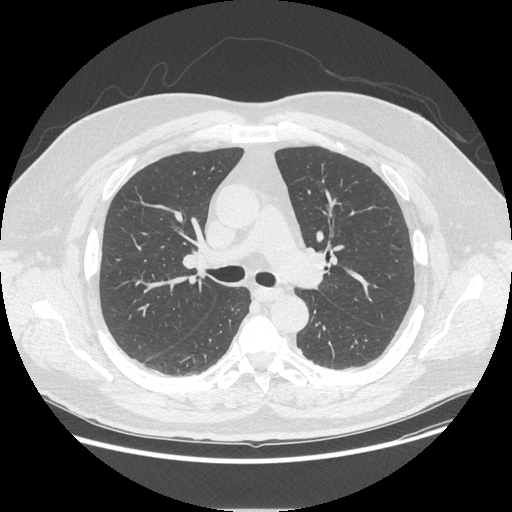
[im 238/347  lung]
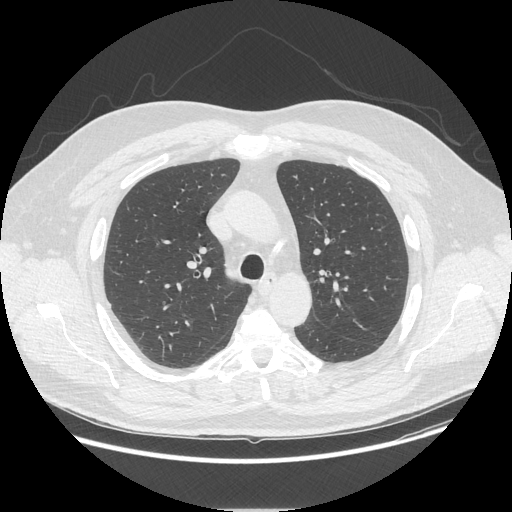
[im 260/347  lung]
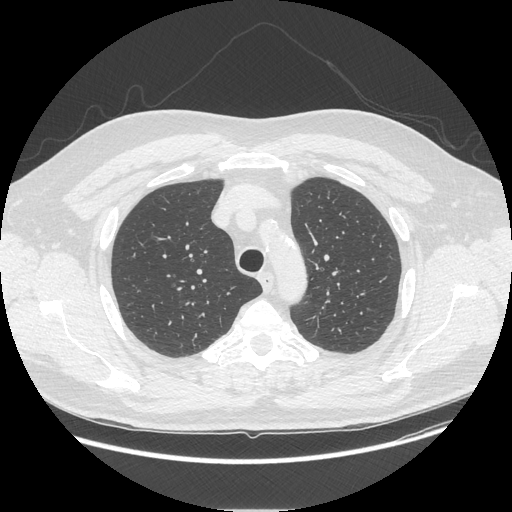
[im 282/347  mediastinal]
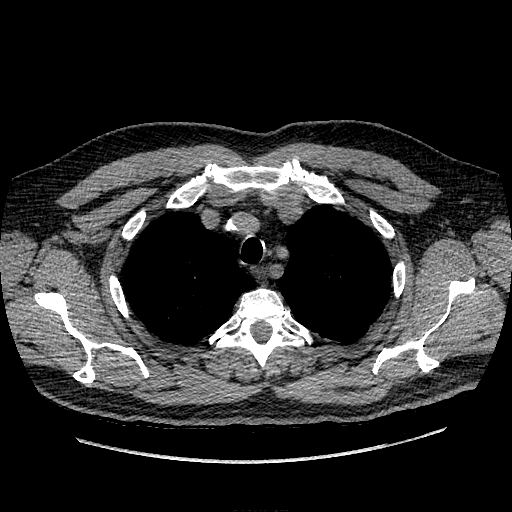
[im 282/347  lung]
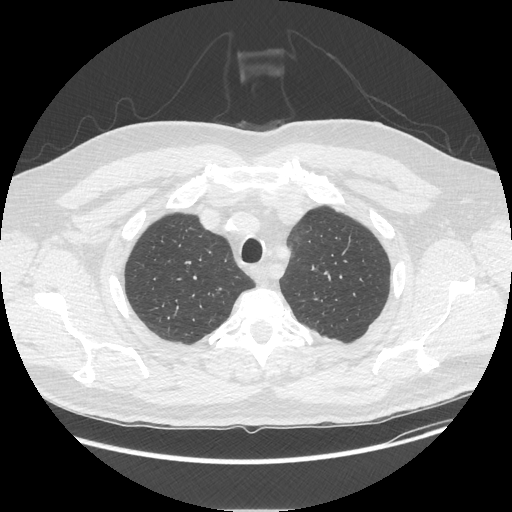
[im 303/347  lung]
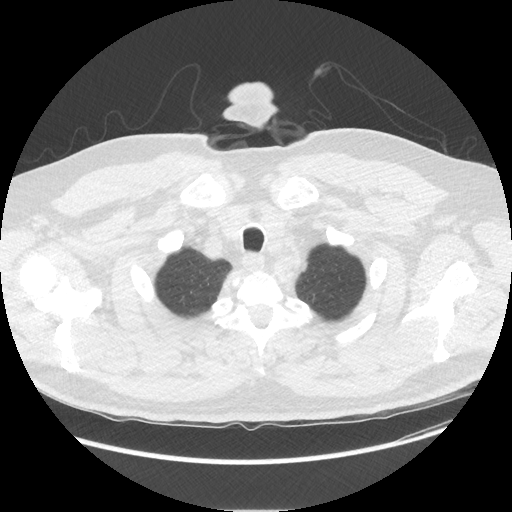
[im 325/347  lung]
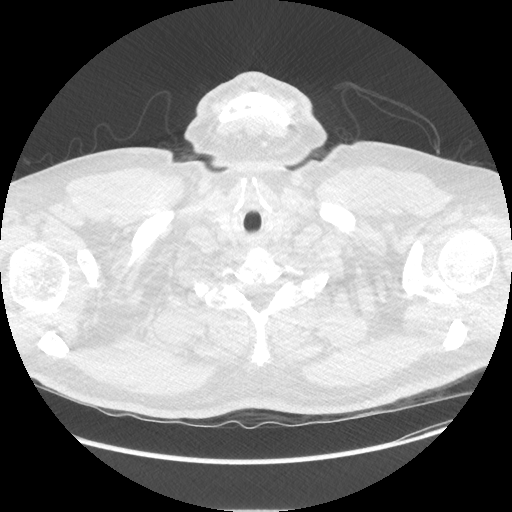

[15 of 30 positions shown; findings below may reference images not displayed]

FINDINGS: There is very minimal dependent subpleural ground-glass atelectasis.
There is minimal subsegmental atelectasis within the medial segment
of the right middle lobe. No discrete focal airspace opacities. No
pleural effusion or pneumothorax. The central pulmonary airways
appear widely patent.

No plural calcifications or evidence of fibrotic lung disease.

No discrete pulmonary nodules.

No mediastinal, hilar or axillary lymphadenopathy on this
noncontrast examination.

Borderline cardiomegaly. Coronary artery calcifications.
Atherosclerotic plaque within the aortic arch. Normal caliber of the
thoracic aorta. No intramural hematoma. Conventional configuration
of the aortic arch. Normal caliber the main pulmonary artery.

Limited noncontrast evaluation of the upper abdomen demonstrates
diffuse decreased attenuation of the hepatic parenchyma suggestive
of hepatic steatosis.

No acute or aggressive osseous abnormalities. Stigmata of DISH
within the caudal aspect of the thoracic spine.

Regional soft tissues appear normal. Normal noncontrast appearance
of the thyroid gland.
IMPRESSION: 1. No CT evidence of asbestos exposure - specifically, no pleural
calcifications or evidence of fibrotic lung disease.
2. Borderline cardiomegaly with coronary artery calcifications.
3. Suspected hepatic steatosis. Correlation with LFTs is
recommended.

## 2016-03-07 ENCOUNTER — Ambulatory Visit: Payer: BLUE CROSS/BLUE SHIELD | Admitting: Endocrinology

## 2016-03-20 ENCOUNTER — Telehealth: Payer: Self-pay | Admitting: Physician Assistant

## 2016-03-20 NOTE — Telephone Encounter (Signed)
Call via Answering Service regarding patient's appointment tomorrow morning at 8 am.  Uncertain what plans are for tomorrow, given road conditions. Advised patient I would contact him back when I knew what to tell him.

## 2016-03-20 NOTE — Telephone Encounter (Signed)
Left message advising patient that no decision regarding the office hours tomorrow has been made, but that we are working to get our staff to the office for 10a-5p hours.  He is encouraged to call the office in the morning at 10 am. If he cannot be seen tomorrow, I expect that Saturday hours will be available, or Monday.

## 2016-03-21 ENCOUNTER — Ambulatory Visit: Payer: BLUE CROSS/BLUE SHIELD

## 2016-03-21 ENCOUNTER — Ambulatory Visit (INDEPENDENT_AMBULATORY_CARE_PROVIDER_SITE_OTHER): Payer: BLUE CROSS/BLUE SHIELD | Admitting: Physician Assistant

## 2016-03-21 VITALS — BP 142/80 | HR 81 | Temp 98.5°F | Resp 16 | Ht 70.0 in | Wt 243.0 lb

## 2016-03-21 DIAGNOSIS — H6122 Impacted cerumen, left ear: Secondary | ICD-10-CM

## 2016-03-21 DIAGNOSIS — H9192 Unspecified hearing loss, left ear: Secondary | ICD-10-CM | POA: Diagnosis not present

## 2016-03-21 NOTE — Patient Instructions (Addendum)
Earwax Buildup Your ears make a substance called earwax. It may also be called cerumen. Sometimes, too much earwax builds up in your ear canal. This can cause ear pain and make it harder for you to hear. CAUSES This condition is caused by too much earwax production or buildup. RISK FACTORS The following factors may make you more likely to develop this condition:  Cleaning your ears often with swabs.  Having narrow ear canals.  Having earwax that is overly thick or sticky.  Having eczema.  Being dehydrated. SYMPTOMS Symptoms of this condition include:  Reduced hearing.  Ear drainage.  Ear pain.  Ear itch.  A feeling of fullness in the ear or feeling that the ear is plugged.  Ringing in the ear.  Coughing. DIAGNOSIS Your health care provider can diagnose this condition based on your symptoms and medical history. Your health care provider will also do an ear exam to look inside your ear with a scope (otoscope). You may also have a hearing test. TREATMENT Treatment for this condition includes:  Over-the-counter or prescription ear drops to soften the earwax.  Earwax removal by a health care provider. This may be done:  By flushing the ear with body-temperature water.  With a medical instrument that has a loop at the end (earwax curette).  With a suction device. HOME CARE INSTRUCTIONS  Take over-the-counter and prescription medicines only as told by your health care provider.  Do not put any objects, including an ear swab, into your ear. You can clean the opening of your ear canal with a washcloth.  Drink enough water to keep your urine clear or pale yellow.  If you have frequent earwax buildup or you use hearing aids, consider seeing your health care provider every 6-12 months for routine preventive ear cleanings. Keep all follow-up visits as told by your health care provider. SEEK MEDICAL CARE IF:  You have ear pain.  Your condition does not improve with  treatment.  You have hearing loss.  You have blood, pus, or other fluid coming from your ear. This information is not intended to replace advice given to you by your health care provider. Make sure you discuss any questions you have with your health care provider. Document Released: 03/27/2004 Document Revised: 06/11/2015 Document Reviewed: 10/04/2014 Elsevier Interactive Patient Education  2017 Elsevier Inc.      IF you received an x-ray today, you will receive an invoice from [facility]. Please contact [facility] at [phone] with questions or concerns regarding your invoice.   IF you received labwork today, you will receive an invoice from [facility]. Please contact [facility] at [phone] with questions or concerns regarding your invoice.   Our billing staff will not be able to assist you with questions regarding bills from these companies.  You will be contacted with the lab results as soon as they are available. The fastest way to get your results is to activate your My Chart account. Instructions are located on the last page of this paperwork. If you have not heard from us regarding the results in 2 weeks, please contact this office.      

## 2016-03-21 NOTE — Progress Notes (Signed)
[facility] [address], [place] [address] [phone]  Date:  03/21/2016   Name:  [name]   DOB:  Aug 24, 1950   MRN:  [id number]  PCP:  No primary care provider on file.    History of Present Illness:  Clayton Terrance. is a 66 y.o. male patient who presents to [facility] for recheck   Patient was seen here 19 days ago with continued cerumen impaction.  1 month ago he was seen here for hearing loss, and pain at the left ear.  This was determined to be cerumen impaction and was irrigated, but could not get all of the wax remaining out.  He was given debrox and advised to return.  Right ear was cleansed 19 days ago to completion, but due to impaction at the left, he was advised to continue the debrox and we will irrigate it once more.  He continues to have hearing loss in the left ear though, however this is much improved.  No fever.  Does not hurt to touch the ear.   Works with ear plugs.  Works with a lot of debris.  Patient Active Problem List   Diagnosis Date Noted  . Alcohol abuse 12/18/2014  . DOE (dyspnea on exertion) 07/18/2014  . Bifascicular block 07/18/2014  . OSA (obstructive sleep apnea) 05/21/2014  . Neuropathy ([place]) 05/05/2014  . DM (diabetes mellitus) ([place]) 04/27/2012    Past Medical History:  Diagnosis Date  . Cancer [facility])    Prostate cancer age 81; Lupron treatments followed by prostatectomy.  . Diabetes mellitus without complication ([place])   . GERD (gastroesophageal reflux disease)   . Hyperlipidemia   . Hypertension   . Myocardial infarction    age 16; no stenting.  . OSA (obstructive sleep apnea)    non-compliant with CPAP.    Past Surgical History:  Procedure Laterality Date  . CHOLECYSTECTOMY    . PROSTATE SURGERY      Social History  Substance Use Topics  . Smoking status: Never Smoker  . Smokeless tobacco: Current User    Types: Chew  . Alcohol use 0.0 oz/week     Comment: social    Family History  Problem  Relation Age of Onset  . Cancer Mother     leukemia  . Diabetes Mother   . Cancer Father 40    lung cancer with mets  . Heart disease Maternal Grandmother   . Cancer Maternal Grandfather   . Heart disease Paternal Grandmother   . Cancer Paternal Grandfather     Allergies  Allergen Reactions  . Cymbalta [Duloxetine Hcl] Nausea And Vomiting  . Darvocet [Propoxyphene N-Acetaminophen] Hives    Medication list has been reviewed and updated.  Current Outpatient Prescriptions on File Prior to Visit  Medication Sig Dispense Refill  . aspirin 81 MG tablet Take 81 mg by mouth daily.    Marland Kitchen atorvastatin (LIPITOR) 20 MG tablet Take 1 tablet (20 mg total) by mouth daily. 90 tablet 3  . azelastine (ASTELIN) 0.1 % nasal spray Place 2 sprays into both nostrils 2 (two) times daily. Use in each nostril as directed 30 mL 11  . BAYER MICROLET LANCETS lancets Check blood sugar two times daily. 100 each 5  . Blood Glucose Monitoring Suppl (BAYER CONTOUR NEXT MONITOR) w/Device KIT Check blood sugar two times daily. 1 kit 2  . carbamide peroxide (DEBROX) 6.5 % otic solution Place 5 drops into both ears 2 (two) times  daily. 15 mL 0  . cetirizine (ZYRTEC) 10 MG tablet Take 1 tablet (10 mg total) by mouth daily. 30 tablet 11  . FLUARIX QUADRIVALENT 0.5 ML injection TO BE ADMINISTERED BY PHARMACIST FOR IMMUNIZATION  0  . fluvastatin (LESCOL) 20 MG capsule Take 1 capsule (20 mg total) by mouth at bedtime. 30 capsule 2  . gabapentin (NEURONTIN) 300 MG capsule Take 3 capsules (900 mg total) by mouth 3 (three) times daily. 270 capsule 3  . glimepiride (AMARYL) 4 MG tablet TAKE 1 TABLET (4 MG TOTAL) BY MOUTH 2 (TWO) TIMES DAILY BEFORE A MEAL. 180 tablet 3  . glucose blood (BAYER CONTOUR NEXT TEST) test strip Check blood sugar two times daily. 100 each 5  . INVOKANA 100 MG TABS tablet 1 TABLET BEFORE BREAKFAST 30 tablet 3  . metFORMIN (GLUCOPHAGE) 1000 MG tablet TAKE 1 TABLET BY MOUTH TWO TIMES A DAY. (Patient taking  differently: 1,000 mg. TAKE 1 TABLET BY MOUTH TWO TIMES A DAY. Some days 3 tabs twice daily) 180 tablet 3  . omeprazole (PRILOSEC) 20 MG capsule Take 1 capsule (20 mg total) by mouth daily. 90 capsule 3  . vitamin B-12 (CYANOCOBALAMIN) 1000 MCG tablet Take 1,000 mcg by mouth daily.     No current facility-administered medications on file prior to visit.     ROS ROS otherwise unremarkable unless listed above.    Physical Examination: BP (!) 142/80 (BP Location: Right Arm, Patient Position: Sitting, Cuff Size: Large)   Pulse 81   Temp 98.5 F (36.9 C) (Oral)   Resp 16   Ht _0  (1.778 m)   Wt 243 lb (110.2 kg)   SpO2 96%   BMI 34.87 kg/m  Ideal Body Weight: Weight in (lb) to have BMI = 25: 173.9  Physical Exam  Constitutional: He is oriented to person, place, and time. He appears well-developed and well-nourished. No distress.  HENT:  Head: Normocephalic and atraumatic.  Right Ear: Tympanic membrane, external ear and ear canal normal.  Left Ear: External ear and ear canal normal.  Cerumen laden canal.  The canal is tortuous and long.  Difficulty viewing the TM.    Eyes: Conjunctivae and EOM are normal. Pupils are equal, round, and reactive to light.  Cardiovascular: Normal rate, regular rhythm and normal heart sounds.  Exam reveals no friction rub.   No murmur heard. Pulmonary/Chest: Effort normal. No respiratory distress.  Neurological: He is alert and oriented to person, place, and time.  Skin: Skin is warm and dry. He is not diaphoretic.  Psychiatric: He has a normal mood and affect. His behavior is normal.     Assessment and Plan: Clayton Rostron. is a 66 y.o. male who is here today for followup of cerumen impaction.   Referring to ent for final cleaning, and to take at hearing.  Hearing loss of left ear, unspecified hearing loss type - Plan: Ambulatory referral to ENT  Impacted cerumen of left ear - Plan: Ambulatory referral to ENT  Ivar Drape,  PA-C [facility] 1/20/201810:04 AM

## 2016-03-28 ENCOUNTER — Ambulatory Visit (INDEPENDENT_AMBULATORY_CARE_PROVIDER_SITE_OTHER): Payer: BLUE CROSS/BLUE SHIELD | Admitting: Endocrinology

## 2016-03-28 ENCOUNTER — Encounter: Payer: Self-pay | Admitting: Endocrinology

## 2016-03-28 VITALS — BP 128/70 | HR 72 | Ht 70.0 in | Wt 249.0 lb

## 2016-03-28 DIAGNOSIS — E1142 Type 2 diabetes mellitus with diabetic polyneuropathy: Secondary | ICD-10-CM

## 2016-03-28 DIAGNOSIS — E782 Mixed hyperlipidemia: Secondary | ICD-10-CM

## 2016-03-28 DIAGNOSIS — E1165 Type 2 diabetes mellitus with hyperglycemia: Secondary | ICD-10-CM | POA: Diagnosis not present

## 2016-03-28 LAB — LDL CHOLESTEROL, DIRECT: Direct LDL: 145 mg/dL

## 2016-03-28 LAB — COMPREHENSIVE METABOLIC PANEL
ALK PHOS: 78 U/L (ref 39–117)
ALT: 24 U/L (ref 0–53)
AST: 15 U/L (ref 0–37)
Albumin: 4.2 g/dL (ref 3.5–5.2)
BUN: 14 mg/dL (ref 6–23)
CHLORIDE: 102 meq/L (ref 96–112)
CO2: 29 meq/L (ref 19–32)
Calcium: 9.6 mg/dL (ref 8.4–10.5)
Creatinine, Ser: 0.83 mg/dL (ref 0.40–1.50)
GFR: 99.39 mL/min (ref >60.00)
GLUCOSE: 127 mg/dL — AB (ref 70–99)
POTASSIUM: 4.2 meq/L (ref 3.5–5.1)
Sodium: 140 mEq/L (ref 135–145)
TOTAL PROTEIN: 7 g/dL (ref 6.0–8.3)
Total Bilirubin: 0.5 mg/dL (ref 0.2–1.2)

## 2016-03-28 LAB — LIPID PANEL
CHOL/HDL RATIO: 5
Cholesterol: 233 mg/dL — ABNORMAL HIGH (ref 0–200)
HDL: 50 mg/dL (ref >39.00)
Triglycerides: 539 mg/dL — ABNORMAL HIGH (ref 0.0–149.0)

## 2016-03-28 LAB — POCT GLYCOSYLATED HEMOGLOBIN (HGB A1C): Hemoglobin A1C: 6.8

## 2016-03-28 MED ORDER — CANAGLIFLOZIN 300 MG PO TABS: 300.0000 mg | ORAL_TABLET | Freq: Every day | ORAL | 3 refills | Status: DC

## 2016-03-28 MED ORDER — TRAMADOL HCL 50 MG PO TABS: 50.0000 mg | ORAL_TABLET | Freq: Two times a day (BID) | ORAL | 0 refills | Status: DC | PRN

## 2016-03-28 NOTE — Patient Instructions (Signed)
Less juice  Take 200mg  Invokana daily; next Rx is 300mg  daily

## 2016-03-28 NOTE — Progress Notes (Signed)
Patient ID: Clayton Kilpatrick., male   DOB: [birth year], 66 y.o.   MRN: [id number]           Reason for Appointment: Follow-up for Type 2 Diabetes   History of Present Illness:          Date of diagnosis of type 2 diabetes mellitus:?  2002       Background history:   He thinks he had increased thirst at the time of diagnosis which was probably 12-14 years ago He was treated with metformin for several years Subsequently Amaryl was added and this has been continued Previous level of control is unknown, records available only since 2013  He previously had poorly controlled diabetes and A1c consistently over 9%  Recent history:     Although his A1c with adding Invokana had improved down to 5.7 it is now up to 6.8  Non-insulin hypoglycemic drugs the patient is taking are: Metformin 1 g twice a day.  Amaryl 2 mg a.m. and 4 mg p.m, Invokana 100 mg daily.  Current management, blood sugar patterns and problems identified:  He did bring his monitor for download  he has gained nearly 10 pounds since his last visit in about 4 months ago  His blood sugars are relatively good in the morning most of the time my variably high in the afternoon and around suppertime but usually lower at bedtime  He says that he is mostly eating a larger meal at lunch and not much in the evening  However he will drink more juice at times causing higher readings  He is not able to do much walking recently       Side effects from medications have been: None  Compliance with the medical regimen: Fair Hypoglycemia: None    Glucose monitoring:  done  1-2  times a day         Glucometer: Contour Blood Glucose readings by download the last 2 weeks:  Mean values apply above for all meters except median for One Touch  PRE-MEAL Fasting Lunch Evening  Bedtime Overall  Glucose range: 120-220  226  81-194     Mean/median: 156     142    Self-care: The diet that the patient has been following is: None reducing  fats He is reducing fruit juices such as grapefruit and cranberry    Meal times are:  Breakfast is usually skipped Typical meal intake: Meat and 2 vegetables at lunch, snacks or fruits at dinner              Dietician visit, most recent: Never               Exercise:  none except some walking at work, does walk relatively slowly  Weight history:  Wt Readings from Last 3 Encounters:  03/28/16 249 lb (112.9 kg)  03/21/16 243 lb (110.2 kg)  03/01/16 249 lb (112.9 kg)    Glycemic control:   Lab Results  Component Value Date   HGBA1C 6.8 03/28/2016   HGBA1C 5.7 11/28/2015   HGBA1C 9.8 08/04/2015   Lab Results  Component Value Date   MICROALBUR 3.6 08/04/2015   LDLCALC 130 (H) 08/04/2015   CREATININE 0.83 03/28/2016   No results found for: MICRALBCREAT     Allergies as of 03/28/2016      Reactions   Cymbalta [duloxetine Hcl] Nausea And Vomiting   Darvocet [propoxyphene N-acetaminophen] Hives      Medication List       Accurate  as of 03/28/16 11:59 PM. Always use your most recent med list.          aspirin 81 MG tablet Take 81 mg by mouth daily.   azelastine 0.1 % nasal spray Commonly known as:  ASTELIN Place 2 sprays into both nostrils 2 (two) times daily. Use in each nostril as directed   BAYER CONTOUR NEXT MONITOR w/Device Kit Check blood sugar two times daily.   BAYER MICROLET LANCETS lancets Check blood sugar two times daily.   canagliflozin 300 MG Tabs tablet Commonly known as:  INVOKANA Take 1 tablet (300 mg total) by mouth daily before breakfast.   carbamide peroxide 6.5 % otic solution Commonly known as:  DEBROX Place 5 drops into both ears 2 (two) times daily.   cetirizine 10 MG tablet Commonly known as:  ZYRTEC Take 1 tablet (10 mg total) by mouth daily.   FLUARIX QUADRIVALENT 0.5 ML injection Generic drug:  Influenza vac split quadrivalent PF TO BE ADMINISTERED BY PHARMACIST FOR IMMUNIZATION   fluvastatin 20 MG capsule Commonly known  as:  LESCOL Take 1 capsule (20 mg total) by mouth at bedtime.   gabapentin 300 MG capsule Commonly known as:  NEURONTIN Take 3 capsules (900 mg total) by mouth 3 (three) times daily.   glimepiride 4 MG tablet Commonly known as:  AMARYL TAKE 1 TABLET (4 MG TOTAL) BY MOUTH 2 (TWO) TIMES DAILY BEFORE A MEAL.   glucose blood test strip Commonly known as:  BAYER CONTOUR NEXT TEST Check blood sugar two times daily.   metFORMIN 1000 MG tablet Commonly known as:  GLUCOPHAGE TAKE 1 TABLET BY MOUTH TWO TIMES A DAY.   omeprazole 20 MG capsule Commonly known as:  PRILOSEC Take 1 capsule (20 mg total) by mouth daily.   traMADol 50 MG tablet Commonly known as:  ULTRAM Take 1 tablet (50 mg total) by mouth every 12 (twelve) hours as needed.   vitamin B-12 1000 MCG tablet Commonly known as:  CYANOCOBALAMIN Take 1,000 mcg by mouth daily.       Allergies:  Allergies  Allergen Reactions  . Cymbalta [Duloxetine Hcl] Nausea And Vomiting  . Darvocet [Propoxyphene N-Acetaminophen] Hives    Past Medical History:  Diagnosis Date  . Cancer [facility])    Prostate cancer age 24; Lupron treatments followed by prostatectomy.  . Diabetes mellitus without complication ([place])   . GERD (gastroesophageal reflux disease)   . Hyperlipidemia   . Hypertension   . Myocardial infarction    age 59; no stenting.  . OSA (obstructive sleep apnea)    non-compliant with CPAP.    Past Surgical History:  Procedure Laterality Date  . CHOLECYSTECTOMY    . PROSTATE SURGERY      Family History  Problem Relation Age of Onset  . Cancer Mother     leukemia  . Diabetes Mother   . Cancer Father 81    lung cancer with mets  . Heart disease Maternal Grandmother   . Cancer Maternal Grandfather   . Heart disease Paternal Grandmother   . Cancer Paternal Grandfather     Social History:  reports that he has never smoked. His smokeless tobacco use includes Chew. He reports that he drinks alcohol. He reports that he  does not use drugs.   Review of Systems   Lipid history: Followed by PCP He thinks he is taking his fluvastatin currently  He thinks he got weak on Lipitor especially legs and this had been stopped.    Lab  Results  Component Value Date   CHOL 233 (H) 03/28/2016   HDL 50.00 03/28/2016   LDLCALC 130 (H) 08/04/2015   LDLDIRECT 145.0 03/28/2016   TRIG (H) 03/28/2016    539.0 Triglyceride is over 400; calculations on Lipids are invalid.   CHOLHDL 5 03/28/2016            Most recent eye exam was ?  8 years ago  NEUROPATHY: He Is  taking 900 mg tid with variable relief of his symptoms He is still complaining about excessive pain at night and having some difficulty sleeping then  no recent infections  Most recent foot exam: 6/17 and seen by podiatrist in 9/17   He has been given B-12 by PCP     Physical Examination:  BP 128/70   Pulse 72   Ht '5\' 10"'  (1.778 m)   Wt 249 lb (112.9 kg)   SpO2 96%   BMI 35.73 kg/m       ASSESSMENT:  Diabetes type 2,   See history of present illness for detailed discussion of current diabetes management, blood sugar patterns and problems identified   Although with starting Invokana initially last year his blood sugars were much improved and he lost weight he has had a higher A1c and a 10 pound weight gain Also he did not see the dietitian as directed He is eating erratically and drinking more juice causing some high readings Not able to exercise much With starting Invokana his blood sugars appear to be significantly better  A1c is 5.7 now Although initially had lost weight this is leveled off now  His blood sugars tend to be a little inconsistent,  checked infrequently and occasionally higher even in the morning   NEUROPATHY: He still has significant pain despite taking gabapentin large doses and has difficulty sleeping  HYPERLIPIDEMIA: He has  reportedly started taking his fluvastatin and will need follow-up levels  PLAN:      increase Invokana to 300 mg  Consultation with dietitian  More frequent glucose monitoring  Consistent diet and avoid juices  Low-fat low carbohydrate diet  More blood sugars after lunch which is his main meal  Trial of TRAMADOL 40m at bedtime for continued neuropathic pain  Reminded him to have an eye exam     Patient Instructions  Less juice  Take 2020mInvokana daily; next Rx is 3006maily  Counseling time on subjects discussed above is over 50% of today's 25 minute visit    [name] 03/30/2016, 8:56 PM   Note: This office note was prepared with Dragon voice recognition system technology. Any transcriptional errors that result from this process are unintentional.  Addendum: LDL 145, will increase his Lescol  to 80 mg

## 2016-03-30 MED ORDER — FLUVASTATIN SODIUM ER 80 MG PO TB24: 80.0000 mg | ORAL_TABLET | Freq: Every day | ORAL | 3 refills | Status: DC

## 2016-04-12 ENCOUNTER — Ambulatory Visit (INDEPENDENT_AMBULATORY_CARE_PROVIDER_SITE_OTHER): Payer: BLUE CROSS/BLUE SHIELD | Admitting: Physician Assistant

## 2016-04-12 VITALS — BP 130/80 | HR 102 | Temp 97.8°F | Resp 17 | Ht 70.5 in | Wt 252.0 lb

## 2016-04-12 DIAGNOSIS — L0390 Cellulitis, unspecified: Secondary | ICD-10-CM | POA: Diagnosis not present

## 2016-04-12 DIAGNOSIS — S81802A Unspecified open wound, left lower leg, initial encounter: Secondary | ICD-10-CM

## 2016-04-12 DIAGNOSIS — M5136 Other intervertebral disc degeneration, lumbar region: Secondary | ICD-10-CM | POA: Diagnosis not present

## 2016-04-12 MED ORDER — CEPHALEXIN 500 MG PO CAPS: 500.0000 mg | ORAL_CAPSULE | Freq: Two times a day (BID) | ORAL | 0 refills | Status: DC

## 2016-04-12 NOTE — Patient Instructions (Addendum)
  Take tylenol 1000 mg of Tylenol every eight hours.    IF you received an x-ray today, you will receive an invoice from [facility]. Please contact [facility] at [phone] with questions or concerns regarding your invoice.   IF you received labwork today, you will receive an invoice from [place] [facility]. Please contact [facility] at [phone] with questions or concerns regarding your invoice.   Our billing staff will not be able to assist you with questions regarding bills from these companies.  You will be contacted with the lab results as soon as they are available. The fastest way to get your results is to activate your My Chart account. Instructions are located on the last page of this paperwork. If you have not heard from [place] regarding the results in 2 weeks, please contact this office.

## 2016-04-12 NOTE — Progress Notes (Signed)
04/12/2016 9:31 AM   DOB: [birth year] / MRN: [id number]  SUBJECTIVE:  [name] is a 66 y.o. male presenting for a scratch to his leg.  This started about 4 days ago and he has been keeping the wound clean, however it is becoming more and more tender and this concerns him.    Complains of his back and legs hurting all the time.  He has a history of polyneuropathy 2/2 diabetes.  He has a history of DDD per CT scan 4 years ago that does show lumbar disk disease diffusely, left worse than right.  His pain today is worse on the left.  He maintains a physically active job.  He takes gabapentin and this helps him with this pain. He is worried that his invokana may be the cause of this pain.  His A1c has gone from near to to controlled in the last 8 months.   Immunization History  Administered Date(s) Administered  . Influenza-Unspecified 11/16/2015  . Pneumococcal Polysaccharide-23 08/04/2015  . Tdap 09/26/2012     He is allergic to cymbalta [duloxetine hcl] and darvocet [propoxyphene n-acetaminophen].   He  has a past medical history of Cancer ([place]); Diabetes mellitus without complication ([place]); GERD (gastroesophageal reflux disease); Hyperlipidemia; Hypertension; Myocardial infarction; and OSA (obstructive sleep apnea).    He  reports that he has never smoked. His smokeless tobacco use includes Chew. He reports that he drinks alcohol. He reports that he does not use drugs. He  reports that he does not currently engage in sexual activity. The patient  has a past surgical history that includes Cholecystectomy and Prostate surgery.  His family history includes Cancer in his maternal grandfather, mother, and paternal grandfather; Cancer (age of onset: 33) in his father; Diabetes in his mother; Heart disease in his maternal grandmother and paternal grandmother.  Review of Systems  Constitutional: Negative for fever.  HENT: Negative for sore throat.   Gastrointestinal: Negative for nausea.    Skin: Positive for rash. Negative for itching.  Neurological: Negative for dizziness.    The problem list and medications were reviewed and updated by myself where necessary and exist elsewhere in the encounter.   OBJECTIVE:  BP 130/80 (BP Location: Right Arm, Patient Position: Sitting, Cuff Size: Normal)   Pulse (!) 102   Temp 97.8 F (36.6 C) (Oral)   Resp 17   Ht 5' 10.5" (1.791 m)   Wt 252 lb (114.3 kg)   SpO2 95%   BMI 35.65 kg/m   Physical Exam  Constitutional: He is oriented to person, place, and time.  Cardiovascular: Normal rate and regular rhythm.   Pulmonary/Chest: Effort normal and breath sounds normal.  Musculoskeletal: Normal range of motion.  Neurological: He is alert and oriented to person, place, and time.  Skin:          Pulse Readings from Last 3 Encounters:  04/12/16 (!) 102  03/28/16 72  03/21/16 81    Lab Results  Component Value Date   HGBA1C 6.8 03/28/2016   Lab Results  Component Value Date   ALT 24 03/28/2016   AST 15 03/28/2016   ALKPHOS 78 03/28/2016   BILITOT 0.5 03/28/2016   Lab Results  Component Value Date   CREATININE 0.83 03/28/2016   BUN 14 03/28/2016   NA 140 03/28/2016   K 4.2 03/28/2016   CL 102 03/28/2016   CO2 29 03/28/2016      No results found for this or any previous visit (from the  past 72 hour(s)).  No results found.  ASSESSMENT AND PLAN:  Zakhi was seen today for leg injury.  Diagnoses and all orders for this visit:  Wound of left lower extremity, initial encounter  Wound cellulitis: RTC in 3 days if he remains tender.  Comments: He has exquisite tenderness which is out of proportion to visual inspection.  Orders: -     cephALEXin (KEFLEX) 500 MG capsule; Take 1 capsule (500 mg total) by mouth 2 (two) times daily.  DDD (degenerative disc disease), lumbar: Advised he take gabapentin 300 mg tid if needed.  Fill tramadol and use only for severe pain.  Tylenol 1000 mg every 8 for base pain  control.  Advised he follow Dr. Ronnie Derby instructions closely as his diabetes is well controlled. Advised he stay physically active.     The patient is advised to call or return to clinic if he does not see an improvement in symptoms, or to seek the care of the closest emergency department if he worsens with the above plan.   Philis Fendt, MHS, PA-C [facility] 04/12/2016 9:31 AM

## 2016-04-14 ENCOUNTER — Ambulatory Visit (INDEPENDENT_AMBULATORY_CARE_PROVIDER_SITE_OTHER): Payer: BLUE CROSS/BLUE SHIELD | Admitting: Otolaryngology

## 2016-04-14 DIAGNOSIS — J342 Deviated nasal septum: Secondary | ICD-10-CM

## 2016-04-14 DIAGNOSIS — H628X1 Other disorders of right external ear in diseases classified elsewhere: Secondary | ICD-10-CM | POA: Diagnosis not present

## 2016-04-14 DIAGNOSIS — J343 Hypertrophy of nasal turbinates: Secondary | ICD-10-CM

## 2016-04-14 DIAGNOSIS — H6123 Impacted cerumen, bilateral: Secondary | ICD-10-CM | POA: Diagnosis not present

## 2016-04-18 ENCOUNTER — Other Ambulatory Visit: Payer: Self-pay | Admitting: Otolaryngology

## 2016-04-28 ENCOUNTER — Ambulatory Visit (INDEPENDENT_AMBULATORY_CARE_PROVIDER_SITE_OTHER): Payer: BLUE CROSS/BLUE SHIELD | Admitting: Otolaryngology

## 2016-04-28 DIAGNOSIS — H60331 Swimmer's ear, right ear: Secondary | ICD-10-CM

## 2016-05-02 ENCOUNTER — Other Ambulatory Visit: Payer: Self-pay | Admitting: Endocrinology

## 2016-05-02 ENCOUNTER — Other Ambulatory Visit: Payer: Self-pay | Admitting: Emergency Medicine

## 2016-05-02 DIAGNOSIS — Q667 Congenital pes cavus: Secondary | ICD-10-CM | POA: Diagnosis not present

## 2016-05-02 DIAGNOSIS — G8929 Other chronic pain: Secondary | ICD-10-CM | POA: Diagnosis not present

## 2016-05-02 DIAGNOSIS — E119 Type 2 diabetes mellitus without complications: Secondary | ICD-10-CM | POA: Diagnosis not present

## 2016-05-02 DIAGNOSIS — E1140 Type 2 diabetes mellitus with diabetic neuropathy, unspecified: Secondary | ICD-10-CM | POA: Diagnosis not present

## 2016-05-09 DIAGNOSIS — C61 Malignant neoplasm of prostate: Secondary | ICD-10-CM | POA: Diagnosis not present

## 2016-05-09 DIAGNOSIS — E669 Obesity, unspecified: Secondary | ICD-10-CM | POA: Diagnosis not present

## 2016-05-09 DIAGNOSIS — E1140 Type 2 diabetes mellitus with diabetic neuropathy, unspecified: Secondary | ICD-10-CM | POA: Diagnosis not present

## 2016-05-09 DIAGNOSIS — R0602 Shortness of breath: Secondary | ICD-10-CM | POA: Diagnosis not present

## 2016-05-09 DIAGNOSIS — F17200 Nicotine dependence, unspecified, uncomplicated: Secondary | ICD-10-CM | POA: Diagnosis not present

## 2016-05-09 DIAGNOSIS — Z79899 Other long term (current) drug therapy: Secondary | ICD-10-CM | POA: Diagnosis not present

## 2016-05-09 DIAGNOSIS — Z1389 Encounter for screening for other disorder: Secondary | ICD-10-CM | POA: Diagnosis not present

## 2016-05-09 DIAGNOSIS — Z0000 Encounter for general adult medical examination without abnormal findings: Secondary | ICD-10-CM | POA: Diagnosis not present

## 2016-05-09 DIAGNOSIS — Z114 Encounter for screening for human immunodeficiency virus [HIV]: Secondary | ICD-10-CM | POA: Diagnosis not present

## 2016-05-09 DIAGNOSIS — Z87891 Personal history of nicotine dependence: Secondary | ICD-10-CM | POA: Diagnosis not present

## 2016-05-09 DIAGNOSIS — Z1159 Encounter for screening for other viral diseases: Secondary | ICD-10-CM | POA: Diagnosis not present

## 2016-05-09 DIAGNOSIS — I10 Essential (primary) hypertension: Secondary | ICD-10-CM | POA: Diagnosis not present

## 2016-05-12 ENCOUNTER — Ambulatory Visit (INDEPENDENT_AMBULATORY_CARE_PROVIDER_SITE_OTHER): Payer: BLUE CROSS/BLUE SHIELD | Admitting: Otolaryngology

## 2016-05-15 ENCOUNTER — Ambulatory Visit (INDEPENDENT_AMBULATORY_CARE_PROVIDER_SITE_OTHER): Payer: BLUE CROSS/BLUE SHIELD | Admitting: Otolaryngology

## 2016-05-15 DIAGNOSIS — H608X3 Other otitis externa, bilateral: Secondary | ICD-10-CM | POA: Diagnosis not present

## 2016-05-15 DIAGNOSIS — H6123 Impacted cerumen, bilateral: Secondary | ICD-10-CM

## 2016-05-16 DIAGNOSIS — Q667 Congenital pes cavus: Secondary | ICD-10-CM | POA: Diagnosis not present

## 2016-05-16 DIAGNOSIS — G8929 Other chronic pain: Secondary | ICD-10-CM | POA: Diagnosis not present

## 2016-05-16 DIAGNOSIS — M79671 Pain in right foot: Secondary | ICD-10-CM | POA: Diagnosis not present

## 2016-05-16 DIAGNOSIS — M722 Plantar fascial fibromatosis: Secondary | ICD-10-CM | POA: Diagnosis not present

## 2016-05-16 DIAGNOSIS — M79673 Pain in unspecified foot: Secondary | ICD-10-CM | POA: Diagnosis not present

## 2016-05-16 DIAGNOSIS — M79672 Pain in left foot: Secondary | ICD-10-CM | POA: Diagnosis not present

## 2016-05-27 ENCOUNTER — Ambulatory Visit (HOSPITAL_BASED_OUTPATIENT_CLINIC_OR_DEPARTMENT_OTHER): Admit: 2016-05-27 | Payer: BLUE CROSS/BLUE SHIELD | Admitting: Otolaryngology

## 2016-05-27 ENCOUNTER — Encounter (HOSPITAL_BASED_OUTPATIENT_CLINIC_OR_DEPARTMENT_OTHER): Payer: Self-pay

## 2016-05-27 SURGERY — SEPTOPLASTY, NOSE, WITH NASAL TURBINATE REDUCTION
Anesthesia: General | Laterality: Bilateral

## 2016-06-03 ENCOUNTER — Other Ambulatory Visit: Payer: Self-pay | Admitting: Endocrinology

## 2016-06-27 ENCOUNTER — Ambulatory Visit: Payer: BLUE CROSS/BLUE SHIELD | Admitting: Endocrinology

## 2016-07-14 ENCOUNTER — Telehealth: Payer: Self-pay | Admitting: Endocrinology

## 2016-07-14 NOTE — Telephone Encounter (Signed)
Patient no showed today's appt. Please advise on how to follow up. °A. No follow up necessary. °B. Follow up urgent. Contact patient immediately. °C. Follow up necessary. Contact patient and schedule visit in ___ days. °D. Follow up advised. Contact patient and schedule visit in ____weeks. ° °

## 2016-07-14 NOTE — Telephone Encounter (Signed)
Schedule patient for follow-up at the earliest appointment  

## 2016-07-30 ENCOUNTER — Other Ambulatory Visit: Payer: Self-pay | Admitting: Emergency Medicine

## 2016-07-30 ENCOUNTER — Other Ambulatory Visit: Payer: Self-pay | Admitting: Endocrinology

## 2016-07-30 DIAGNOSIS — Z1159 Encounter for screening for other viral diseases: Secondary | ICD-10-CM

## 2016-07-31 ENCOUNTER — Ambulatory Visit (INDEPENDENT_AMBULATORY_CARE_PROVIDER_SITE_OTHER): Payer: BLUE CROSS/BLUE SHIELD | Admitting: Physician Assistant

## 2016-07-31 ENCOUNTER — Encounter: Payer: Self-pay | Admitting: Physician Assistant

## 2016-07-31 VITALS — BP 134/82 | HR 93 | Temp 98.6°F | Resp 16 | Ht 70.5 in | Wt 239.0 lb

## 2016-07-31 DIAGNOSIS — E1140 Type 2 diabetes mellitus with diabetic neuropathy, unspecified: Secondary | ICD-10-CM | POA: Diagnosis not present

## 2016-07-31 DIAGNOSIS — J019 Acute sinusitis, unspecified: Secondary | ICD-10-CM

## 2016-07-31 DIAGNOSIS — B9689 Other specified bacterial agents as the cause of diseases classified elsewhere: Secondary | ICD-10-CM | POA: Diagnosis not present

## 2016-07-31 LAB — POCT GLYCOSYLATED HEMOGLOBIN (HGB A1C): Hemoglobin A1C: 7.8

## 2016-07-31 MED ORDER — AMOXICILLIN-POT CLAVULANATE 875-125 MG PO TABS: 1.0000 | ORAL_TABLET | Freq: Two times a day (BID) | ORAL | 0 refills | Status: DC

## 2016-07-31 NOTE — Patient Instructions (Addendum)
Please contact Dr. Ronnie Derby office and see if you can move your appointment up because your A1c is elevated relative to your previous.      IF you received an x-ray today, you will receive an invoice from [facility]. Please contact [facility] at [phone] with questions or concerns regarding your invoice.   IF you received labwork today, you will receive an invoice from [place]. Please contact [facility] at [phone] with questions or concerns regarding your invoice.   Our billing staff will not be able to assist you with questions regarding bills from these companies.  You will be contacted with the lab results as soon as they are available. The fastest way to get your results is to activate your My Chart account. Instructions are located on the last page of this paperwork. If you have not heard from [place] regarding the results in 2 weeks, please contact this office.

## 2016-07-31 NOTE — Progress Notes (Signed)
07/31/2016 5:32 PM   DOB: 27-Dec-1950 / MRN: [id number]  SUBJECTIVE:  Clayton Heckmann. is a 66 y.o. male presenting for sinus pain that has been present for a few weeks.  Tells me that he is coughing due to post nasal drip.  Associates HA, facial pain, and denies any teeth pain.  Did have a tactile fever this morning.  He has tried Zyrtec, pseudoephedrine, and flonase without relief. Denies a history C. Difficile.   He is a long time diabetic.  Takes Metformin, glimepiride, and an SGLT2 inhibitor. Says since taking this regimen his feet have. He has an appointment with Dr. Dwyane Dee on August the 2nd.   He is allergic to cymbalta [duloxetine hcl] and darvocet [propoxyphene n-acetaminophen].   He  has a past medical history of Cancer ([place]); Diabetes mellitus without complication ([place]); GERD (gastroesophageal reflux disease); Hyperlipidemia; Hypertension; Myocardial infarction ([place]); and OSA (obstructive sleep apnea).    He  reports that he has never smoked. His smokeless tobacco use includes Chew. He reports that he drinks alcohol. He reports that he does not use drugs. He  reports that he does not currently engage in sexual activity. The patient  has a past surgical history that includes Cholecystectomy and Prostate surgery.  His family history includes Cancer in his maternal grandfather, mother, and paternal grandfather; Cancer (age of onset: 30) in his father; Diabetes in his mother; Heart disease in his maternal grandmother and paternal grandmother.  Review of Systems  Constitutional: Negative for chills and fever.  Respiratory: Negative for cough.   Cardiovascular: Negative for chest pain.  Gastrointestinal: Negative for nausea.  Skin: Negative for itching and rash.    The problem list and medications were reviewed and updated by myself where necessary and exist elsewhere in the encounter.   OBJECTIVE:  BP 134/82 (BP Location: Right Arm, Patient Position: Sitting, Cuff Size: Large)    Pulse 93   Temp 98.6 F (37 C) (Oral)   Resp 16   Ht 5' 10.5" (1.791 m)   Wt 239 lb (108.4 kg)   SpO2 94%   BMI 33.81 kg/m   Physical Exam  Constitutional: He appears well-developed. He is active and cooperative.  Non-toxic appearance.  HENT:  Right Ear: Hearing, tympanic membrane, external ear and ear canal normal.  Left Ear: Hearing, tympanic membrane, external ear and ear canal normal.  Nose: Nose normal. Right sinus exhibits no maxillary sinus tenderness and no frontal sinus tenderness. Left sinus exhibits no maxillary sinus tenderness and no frontal sinus tenderness.  Mouth/Throat: Uvula is midline, oropharynx is clear and moist and mucous membranes are normal. No oropharyngeal exudate, posterior oropharyngeal edema or tonsillar abscesses.  Eyes: Conjunctivae are normal. Pupils are equal, round, and reactive to light.  Cardiovascular: Normal rate, regular rhythm, S1 normal, S2 normal, normal heart sounds, intact distal pulses and normal pulses.  Exam reveals no gallop and no friction rub.   No murmur heard. Pulmonary/Chest: Effort normal. No stridor. No tachypnea. No respiratory distress. He has no wheezes. He has no rales.  Abdominal: He exhibits no distension.  Musculoskeletal: He exhibits no edema.  Lymphadenopathy:       Head (right side): No submandibular and no tonsillar adenopathy present.       Head (left side): No submandibular and no tonsillar adenopathy present.    He has no cervical adenopathy.  Neurological: He is alert.  Skin: Skin is warm and dry. He is not diaphoretic. No pallor.  Vitals reviewed.  Results for  orders placed or performed in visit on 07/31/16 (from the past 72 hour(s))  POCT glycosylated hemoglobin (Hb A1C)     Status: None   Collection Time: 07/31/16  5:28 PM  Result Value Ref Range   Hemoglobin A1C 7.8     No results found.  ASSESSMENT AND PLAN:  Deloris was seen today for sinus congestion and cough.  Diagnoses and all orders for this  visit:  Acute bacterial rhinosinusitis: He has been symptomatic for about 2 weeks now.  He has tried antihistamines and this have not helped. Will go ahead and treat.  Given elevated a1c advised he call his endocrinologist and get his appointment moved up.  -     amoxicillin-clavulanate (AUGMENTIN) 875-125 MG tablet; Take 1 tablet by mouth 2 (two) times daily.  Type 2 diabetes mellitus with diabetic neuropathy, without long-term current use of insulin (HCC) -     POCT glycosylated hemoglobin (Hb A1C)    The patient is advised to call or return to clinic if he does not see an improvement in symptoms, or to seek the care of the closest emergency department if he worsens with the above plan.   Philis Fendt, MHS, PA-C [facility] 07/31/2016 5:32 PM

## 2016-08-22 DIAGNOSIS — S0502XA Injury of conjunctiva and corneal abrasion without foreign body, left eye, initial encounter: Secondary | ICD-10-CM | POA: Diagnosis not present

## 2016-08-31 ENCOUNTER — Other Ambulatory Visit: Payer: Self-pay | Admitting: Emergency Medicine

## 2016-08-31 DIAGNOSIS — E1140 Type 2 diabetes mellitus with diabetic neuropathy, unspecified: Secondary | ICD-10-CM

## 2016-09-01 NOTE — Telephone Encounter (Signed)
Please call patient. Rx authorized. Needs to schedule follow-up.  Meds ordered this encounter  Medications  . metFORMIN (GLUCOPHAGE) 1000 MG tablet    Sig: TAKE 1 TABLET BY MOUTH TWO TIMES A DAY.    Dispense:  180 tablet    Refill:  0    Please notify patient that s/he needs an office visit +/- labsfor additional refills.

## 2016-09-12 DIAGNOSIS — E113413 Type 2 diabetes mellitus with severe nonproliferative diabetic retinopathy with macular edema, bilateral: Secondary | ICD-10-CM | POA: Diagnosis not present

## 2016-09-12 DIAGNOSIS — H18832 Recurrent erosion of cornea, left eye: Secondary | ICD-10-CM | POA: Diagnosis not present

## 2016-09-12 DIAGNOSIS — H25813 Combined forms of age-related cataract, bilateral: Secondary | ICD-10-CM | POA: Diagnosis not present

## 2016-09-12 LAB — HM DIABETES EYE EXAM

## 2016-09-23 DIAGNOSIS — M79671 Pain in right foot: Secondary | ICD-10-CM | POA: Diagnosis not present

## 2016-09-23 DIAGNOSIS — L600 Ingrowing nail: Secondary | ICD-10-CM | POA: Diagnosis not present

## 2016-09-23 DIAGNOSIS — E1140 Type 2 diabetes mellitus with diabetic neuropathy, unspecified: Secondary | ICD-10-CM | POA: Diagnosis not present

## 2016-09-23 DIAGNOSIS — E1151 Type 2 diabetes mellitus with diabetic peripheral angiopathy without gangrene: Secondary | ICD-10-CM | POA: Diagnosis not present

## 2016-10-02 ENCOUNTER — Ambulatory Visit (INDEPENDENT_AMBULATORY_CARE_PROVIDER_SITE_OTHER): Payer: BLUE CROSS/BLUE SHIELD | Admitting: Endocrinology

## 2016-10-02 ENCOUNTER — Encounter: Payer: Self-pay | Admitting: Dietician

## 2016-10-02 ENCOUNTER — Encounter: Payer: Self-pay | Admitting: Endocrinology

## 2016-10-02 ENCOUNTER — Encounter: Payer: BLUE CROSS/BLUE SHIELD | Attending: Endocrinology | Admitting: Dietician

## 2016-10-02 ENCOUNTER — Other Ambulatory Visit (INDEPENDENT_AMBULATORY_CARE_PROVIDER_SITE_OTHER): Payer: BLUE CROSS/BLUE SHIELD

## 2016-10-02 VITALS — BP 138/80 | HR 77 | Ht 70.5 in | Wt 236.0 lb

## 2016-10-02 DIAGNOSIS — E1142 Type 2 diabetes mellitus with diabetic polyneuropathy: Secondary | ICD-10-CM

## 2016-10-02 DIAGNOSIS — E782 Mixed hyperlipidemia: Secondary | ICD-10-CM

## 2016-10-02 DIAGNOSIS — E1165 Type 2 diabetes mellitus with hyperglycemia: Secondary | ICD-10-CM | POA: Insufficient documentation

## 2016-10-02 LAB — LIPID PANEL
Cholesterol: 225 mg/dL — ABNORMAL HIGH (ref 0–200)
HDL: 54.2 mg/dL (ref >39.00)
NONHDL: 171.13
Total CHOL/HDL Ratio: 4
Triglycerides: 324 mg/dL — ABNORMAL HIGH (ref 0.0–149.0)
VLDL: 64.8 mg/dL — ABNORMAL HIGH (ref 0.0–40.0)

## 2016-10-02 LAB — COMPREHENSIVE METABOLIC PANEL
ALBUMIN: 4.2 g/dL (ref 3.5–5.2)
ALK PHOS: 55 U/L (ref 39–117)
ALT: 17 U/L (ref 0–53)
AST: 23 U/L (ref 0–37)
BILIRUBIN TOTAL: 0.8 mg/dL (ref 0.2–1.2)
BUN: 16 mg/dL (ref 6–23)
CALCIUM: 8.9 mg/dL (ref 8.4–10.5)
CO2: 27 mEq/L (ref 19–32)
CREATININE: 0.77 mg/dL (ref 0.40–1.50)
Chloride: 102 mEq/L (ref 96–112)
GFR: 108.21 mL/min (ref >60.00)
Glucose, Bld: 106 mg/dL — ABNORMAL HIGH (ref 70–99)
Potassium: 4.2 mEq/L (ref 3.5–5.1)
SODIUM: 137 meq/L (ref 135–145)
TOTAL PROTEIN: 6.9 g/dL (ref 6.0–8.3)

## 2016-10-02 LAB — MICROALBUMIN / CREATININE URINE RATIO
Creatinine,U: 124.9 mg/dL
Microalb Creat Ratio: 1.1 mg/g (ref 0.0–30.0)
Microalb, Ur: 1.4 mg/dL (ref 0.0–1.9)

## 2016-10-02 LAB — LDL CHOLESTEROL, DIRECT: LDL DIRECT: 143 mg/dL

## 2016-10-02 MED ORDER — CANAGLIFLOZIN 300 MG PO TABS: 300.0000 mg | ORAL_TABLET | Freq: Every day | ORAL | 3 refills | Status: DC

## 2016-10-02 NOTE — Addendum Note (Signed)
Addended by: Kaylyn Lim I on: 10/02/2016 10:05 AM   Modules accepted: Orders

## 2016-10-02 NOTE — Progress Notes (Addendum)
Patient ID: [name], male   DOB: [birth year], 66 y.o.   MRN: [id number]           Reason for Appointment: Follow-up for Type 2 Diabetes   History of Present Illness:          Date of diagnosis of type 2 diabetes mellitus:?  2002       Background history:   He thinks he had increased thirst at the time of diagnosis which was probably 12-14 years ago He was treated with metformin for several years Subsequently Amaryl was added and this has been continued Previous level of control is unknown, records available only since 2013  He previously had poorly controlled diabetes and A1c consistently over 9%  Recent history:     Although his A1c with adding Invokana had improved down to 5.7 it is up to 7.8 as of May  Non-insulin hypoglycemic drugs the patient is taking are: Metformin 1 g twice a day.  Amaryl 46m a.m. Jardiance 10 mg daily.  Current management, blood sugar patterns and problems identified:  He did bring his monitor for download  Although he has lost significant amount of weight since his last visit in January his blood sugars are not as well-controlled  His blood sugars are variable and may have several readings over 200 fasting  He thinks he is not eating much in the evening at dinnertime but is drinking up to 5 beers every evening  His main meal is at lunch and periodically has readings over 200 in the afternoon  He was supposed to see the dietitian last year but did not show up for follow-up  Highest reading was 428 after drinking some juice at night  He is not able to do much walking because of neuropathy, tries to be active at work  He has been seen by another physician who switched him from ICambodiato 10 mg Jardiance  He was supposed to take 4 mg Amaryl in the evening and is taking only this once a day in the morning       Side effects from medications have been: None  Compliance with the medical regimen: Fair Hypoglycemia: None    Glucose  monitoring:  done  1-2  times a day         Glucometer: Contour Blood Glucose readings by download  Mean values apply above for all meters except median for One Touch  PRE-MEAL Fasting Lunch Dinner Bedtime Overall  Glucose range:  1 29-428   10 4-216     Mean/median: 2376   283  151  761  607   Self-care: The diet that the patient has been following is: None reducing fats He is reducing fruit juices such as grapefruit and cranberry    Meal times are:  Breakfast is usually skipped Typical meal intake: Meat and 2 vegetables at lunch, snacks or fruits at dinner              Dietician visit, most recent: Never               Exercise:  none except some walking at work, does walk relatively slowly  Weight history:  Wt Readings from Last 3 Encounters:  10/02/16 236 lb (107 kg)  07/31/16 239 lb (108.4 kg)  04/12/16 252 lb (114.3 kg)    Glycemic control:   Lab Results  Component Value Date   HGBA1C 7.8 07/31/2016   HGBA1C 6.8 03/28/2016   HGBA1C 5.7 11/28/2015  Lab Results  Component Value Date   MICROALBUR 3.6 08/04/2015   LDLCALC 130 (H) 08/04/2015   CREATININE 0.83 03/28/2016   No results found for: MICRALBCREAT     Allergies as of 10/02/2016      Reactions   Cymbalta [duloxetine Hcl] Nausea And Vomiting   Darvocet [propoxyphene N-acetaminophen] Hives      Medication List       Accurate as of 10/02/16  9:21 AM. Always use your most recent med list.          amoxicillin-clavulanate 875-125 MG tablet Commonly known as:  AUGMENTIN Take 1 tablet by mouth 2 (two) times daily.   aspirin 81 MG tablet Take 81 mg by mouth daily.   azelastine 0.1 % nasal spray Commonly known as:  ASTELIN PLACE 2 SPRAYS INTO BOTH NOSTRILS 2 (TWO) TIMES DAILY. USE IN EACH NOSTRIL AS DIRECTED   BAYER CONTOUR NEXT MONITOR w/Device Kit Check blood sugar two times daily.   BAYER CONTOUR NEXT TEST test strip Generic drug:  glucose blood CHECK BLOOD SUGAR TWICE DAILY   BAYER  MICROLET LANCETS lancets Check blood sugar two times daily.   canagliflozin 300 MG Tabs tablet Commonly known as:  INVOKANA Take 1 tablet (300 mg total) by mouth daily before breakfast.   carbamide peroxide 6.5 % OTIC solution Commonly known as:  DEBROX Place 5 drops into both ears 2 (two) times daily.   cetirizine 10 MG tablet Commonly known as:  ZYRTEC Take 1 tablet (10 mg total) by mouth daily.   FLUARIX QUADRIVALENT 0.5 ML injection Generic drug:  Influenza vac split quadrivalent PF TO BE ADMINISTERED BY PHARMACIST FOR IMMUNIZATION   fluvastatin XL 80 MG 24 hr tablet Commonly known as:  LESCOL XL TAKE 1 TABLET (80 MG TOTAL) BY MOUTH DAILY.   gabapentin 300 MG capsule Commonly known as:  NEURONTIN TAKE 3 CAPSULES (900 MG TOTAL) BY MOUTH 3 (THREE) TIMES DAILY.   glimepiride 4 MG tablet Commonly known as:  AMARYL TAKE 1 TABLET (4 MG TOTAL) BY MOUTH 2 (TWO) TIMES DAILY BEFORE A MEAL.   metFORMIN 1000 MG tablet Commonly known as:  GLUCOPHAGE TAKE 1 TABLET BY MOUTH TWO TIMES A DAY.   omeprazole 20 MG capsule Commonly known as:  PRILOSEC Take 1 capsule (20 mg total) by mouth daily.   traMADol 50 MG tablet Commonly known as:  ULTRAM Take 1 tablet (50 mg total) by mouth every 12 (twelve) hours as needed.   vitamin B-12 1000 MCG tablet Commonly known as:  CYANOCOBALAMIN Take 1,000 mcg by mouth daily.       Allergies:  Allergies  Allergen Reactions  . Cymbalta [Duloxetine Hcl] Nausea And Vomiting  . Darvocet [Propoxyphene N-Acetaminophen] Hives    Past Medical History:  Diagnosis Date  . Cancer [facility])    Prostate cancer age 70; Lupron treatments followed by prostatectomy.  . Diabetes mellitus without complication ([place])   . GERD (gastroesophageal reflux disease)   . Hyperlipidemia   . Hypertension   . Myocardial infarction [facility])    age 66; no stenting.  . OSA (obstructive sleep apnea)    non-compliant with CPAP.    Past Surgical History:  Procedure  Laterality Date  . CHOLECYSTECTOMY    . PROSTATE SURGERY      Family History  Problem Relation Age of Onset  . Cancer Mother        leukemia  . Diabetes Mother   . Cancer Father 45       lung  cancer with mets  . Heart disease Maternal Grandmother   . Cancer Maternal Grandfather   . Heart disease Paternal Grandmother   . Cancer Paternal Grandfather     Social History:  reports that he has never smoked. His smokeless tobacco use includes Chew. He reports that he drinks alcohol. He reports that he does not use drugs.   Review of Systems   Lipid history: Followed by PCP He thinks he is taking his fluvastatin 20 currently He Is drinking alcohol regularly, usually 5 beers at night    Lab Results  Component Value Date   CHOL 233 (H) 03/28/2016   HDL 50.00 03/28/2016   LDLCALC 130 (H) 08/04/2015   LDLDIRECT 145.0 03/28/2016   TRIG (H) 03/28/2016    539.0 Triglyceride is over 400; calculations on Lipids are invalid.   CHOLHDL 5 03/28/2016            Most recent eye exam was 7/18  NEUROPATHY: He Is  taking 900 mg tid with variable relief of his symptoms He is still complaining about excessive pain at night and having some difficulty sleeping then  no recent infections  Most recent foot exam: 6/17 and seen by podiatrist in 9/17   He was found by his ophthalmologist to have severe retinopathy and is going to see a retinal surgeon    Physical Examination:  BP 138/80   Pulse 77   Ht 5' 10.5" (1.791 m)   Wt 236 lb (107 kg)   SpO2 97%   BMI 33.38 kg/m       ASSESSMENT:  Diabetes type 2,   See history of present illness for detailed discussion of current diabetes management, blood sugar patterns and problems identified  His blood sugars have been progressively higher over the last several months Although he has lost weight his blood sugars are in consistently high His main difficulties not being consistent with diet causing fluctuating blood sugars and also  alcohol intake He does not do any programmed exercise Also is taking the Amaryl in the morning which is not controlling his overnight sugars Currently with taking Jardiance from another physician that the 10 mg dose he is not getting the efficacy was with Invokana 300 mg  He is concerned about his retinopathy which has been recently diagnosed indicating history of poor control of his diabetes  NEUROPATHY: He still has pain despite taking gabapentin large doses and hasTaken some tramadol also now  HYPERLIPIDEMIA: He has Taken fluvastatin 20 mg and did not switch to 80 mg as directed Will need follow-up levels today Discussed that he needs to cut back significantly on his alcohol intake because of his high triglycerides  PLAN:     Restart Invokana 300 mg instead of Jardiance  Consultation with dietitian today  More frequent glucose monitoring after meals  Start improving diet , reduce alcohol intake significantly and avoid juices  He will take Amaryl 2 mg twice a day for now  Consider using Ozempic on the next visit if blood sugars are not controlled  Will reassess his diabetes control today with fructosamine level also  Recheck A1c in about 6 weeks     Patient Instructions  Check blood sugars on waking up    Also check blood sugars about 2 hours after a meal and do this after different meals by rotation  Recommended blood sugar levels on waking up is 90-130 and about 2 hours after meal is 130-160  Please bring your blood sugar monitor to each  visit, thank you    Amaryl 62m a.m. and 2 mg p.m,  Cut beer 50%  Counseling time on subjects discussed above is over 50% of today's 25 minute visit    Landrie Beale 10/02/2016, 9:21 AM   Note: This office note was prepared with DEstate agent Any transcriptional errors that result from this process are unintentional.   Addendum: LDL 143, will change lovastatin to 80 mg daily

## 2016-10-02 NOTE — Patient Instructions (Addendum)
Check blood sugars on waking up    Also check blood sugars about 2 hours after a meal and do this after different meals by rotation  Recommended blood sugar levels on waking up is 90-130 and about 2 hours after meal is 130-160  Please bring your blood sugar monitor to each visit, thank you    Amaryl 2mg  a.m. and 2 mg p.m,  Cut beer 50%

## 2016-10-02 NOTE — Patient Instructions (Addendum)
Rethink what you drink.  (Instead of regular soda and juice, choose water.) Choose baked rather than fried.  Use less added fat including mayo. Aim for 3 meals per day.    Consider a simple breakfast with carbohydrate and protein. 1/2 of your plate should be non starchy vegetables (starchy vegetables are potatoes, sweet potatoes, corn, peas, dried beans- these are fine but have smaller portions). 1/4 of your plate should be protein 1/4 of your plate should be starch  Aim to stay active most days.  Continue to take your medications as prescribed.  Take metformin with food! Next time you buy Vitamin B-12 get one that says disolvable or sublingual.

## 2016-10-02 NOTE — Progress Notes (Signed)
Diabetes Self-Management Education  Visit Type: First/Initial  Appt. Start Time: 0900 Appt. End Time: 1000  10/02/2016  [name], identified by name and date of birth, is a 66 y.o. male with a diagnosis of Diabetes: Type 2. Other hx includes neuropathy, severe retinopathy in left eye, GERD, HTN, hyperlipidemia, OSA and cannot wear c-pap as well as a hx of vitamin D deficiency.  His weight has decreased from 252 lbs 04/2016 to 236 lbs today.  He generally eats only one meal per day and eats out.  His triglycerides were 539 03/2016 and drinks 5 beer daily per MD but did not divulge this to me.  He also drinks juice and regular soda daily.  Sent a follow up note to the patient and discussed continued recommendation to change his beverages and the influence on the triglycerides.  Medications include glimepiride, Metformin, Invokana, vitamin B-12  He has been taking the Metformin on an empty stomach and has GI problems related to this.  Patient lives alone.  He currently is working in [place] area but travels a lot with his job for weeks at a time in foreign countries.  He si a Librarian, academic at [employer].  His job requires him to climb ladders and he is reporting more and more difficulty with this due to neuropathy and balance concerns.  ASSESSMENT  Weight 236 lb (107 kg). Body mass index is 33.38 kg/m.      Diabetes Self-Management Education - 10/02/16 1730      Visit Information   Visit Type First/Initial     Initial Visit   Diabetes Type Type 2   Are you currently following a meal plan? No   Are you taking your medications as prescribed? Yes   Date Diagnosed 2002     Psychosocial Assessment   Self-care barriers Other (comment)  time and traveling   Self-management support Doctor's office   Other persons present Patient   Patient Concerns Nutrition/Meal planning;Glycemic Control   Special Needs None   Preferred Learning Style No preference indicated   Learning Readiness Ready   How often do you need to have someone help you when you read instructions, pamphlets, or other written materials from your doctor or pharmacy? 1 - Never   What is the last grade level you completed in school? 12th grade     Pre-Education Assessment   Patient understands the diabetes disease and treatment process. Needs Review   Patient understands incorporating nutritional management into lifestyle. Needs Review   Patient undertands incorporating physical activity into lifestyle. Needs Review   Patient understands using medications safely. Needs Review   Patient understands monitoring blood glucose, interpreting and using results Needs Review   Patient understands prevention, detection, and treatment of acute complications. Needs Review   Patient understands prevention, detection, and treatment of chronic complications. Needs Review   Patient understands how to develop strategies to address psychosocial issues. Needs Review   Patient understands how to develop strategies to promote health/change behavior. Needs Review     Complications   Last HgB A1C per patient/outside source 7.8 %  07/31/16   Have you had a dilated eye exam in the past 12 months? Yes     Dietary Intake   Breakfast skips    Lunch fried chicken livers, vegetables, blackeyed peas, potatoes, cornbread OR country style steak and simular sides   Beverage(s) water, regular soft drinks, unsweetened tea, 5 beer per day per MD (patient did not divulge)  Exercise   Exercise Type Light (walking / raking leaves)  increased walking with his job     Patient Education   Previous Diabetes Education No   Disease state  Explored patient's options for treatment of their diabetes   Nutrition management  Role of diet in the treatment of diabetes and the relationship between the three main macronutrients and blood glucose level;Meal options for control of blood glucose level and chronic  complications.;Information on hints to eating out and maintain blood glucose control.;Food label reading, portion sizes and measuring food.   Physical activity and exercise  Role of exercise on diabetes management, blood pressure control and cardiac health.   Medications Reviewed patients medication for diabetes, action, purpose, timing of dose and side effects.   Psychosocial adjustment Worked with patient to identify barriers to care and solutions   Personal strategies to promote health Lifestyle issues that need to be addressed for better diabetes care     Individualized Goals (developed by patient)   Nutrition General guidelines for healthy choices and portions discussed   Physical Activity Exercise 5-7 days per week;30 minutes per day   Medications take my medication as prescribed   Monitoring  test my blood glucose as discussed   Problem Solving meal frequency and timing   Reducing Risk examine blood glucose patterns   Health Coping discuss diabetes with (comment)  MD/RD     Post-Education Assessment   Patient understands the diabetes disease and treatment process. Demonstrates understanding / competency   Patient understands incorporating nutritional management into lifestyle. Demonstrates understanding / competency   Patient undertands incorporating physical activity into lifestyle. Demonstrates understanding / competency   Patient understands using medications safely. Demonstrates understanding / competency   Patient understands monitoring blood glucose, interpreting and using results Demonstrates understanding / competency   Patient understands prevention, detection, and treatment of acute complications. Demonstrates understanding / competency   Patient understands prevention, detection, and treatment of chronic complications. Demonstrates understanding / competency   Patient understands how to develop strategies to address psychosocial issues. Demonstrates understanding /  competency   Patient understands how to develop strategies to promote health/change behavior. Demonstrates understanding / competency     Outcomes   Expected Outcomes Other (comment)  demonstrated interest in learning but questions ability/desire to change   Future DMSE PRN   Program Status Completed      Individualized Plan for Diabetes Self-Management Training:   Learning Objective:  Patient will have a greater understanding of diabetes self-management. Patient education plan is to attend individual and/or group sessions per assessed needs and concerns.   Plan:   Patient Instructions  Rethink what you drink.  (Instead of regular soda and juice, choose water.) Choose baked rather than fried.  Use less added fat including mayo. Aim for 3 meals per day.    Consider a simple breakfast with carbohydrate and protein. 1/2 of your plate should be non starchy vegetables (starchy vegetables are potatoes, sweet potatoes, corn, peas, dried beans- these are fine but have smaller portions). 1/4 of your plate should be protein 1/4 of your plate should be starch  Aim to stay active most days.  Continue to take your medications as prescribed.  Take metformin with food! Next time you buy Vitamin B-12 get one that says disolvable or sublingual.   Expected Outcomes:  Other (comment) (demonstrated interest in learning but questions ability/desire to change)  Education material provided: Food label handouts, Meal plan card, My Plate and Snack sheet, breakfast ideas, dining  out with diabetes  If problems or questions, patient to contact team via:  Phone  Future DSME appointment: PRN

## 2016-10-02 NOTE — Addendum Note (Signed)
Addended by: Kaylyn Lim I on: 10/02/2016 03:26 PM   Modules accepted: Orders

## 2016-10-03 LAB — FRUCTOSAMINE: FRUCTOSAMINE: 315 umol/L — AB (ref 0–285)

## 2016-10-06 MED ORDER — FLUVASTATIN SODIUM ER 80 MG PO TB24: 80.0000 mg | ORAL_TABLET | Freq: Every day | ORAL | 3 refills | Status: DC

## 2016-10-06 NOTE — Addendum Note (Signed)
Addended by: Elayne Snare on: 10/06/2016 08:41 AM   Modules accepted: Orders

## 2016-10-07 ENCOUNTER — Encounter (INDEPENDENT_AMBULATORY_CARE_PROVIDER_SITE_OTHER): Payer: BLUE CROSS/BLUE SHIELD | Admitting: Ophthalmology

## 2016-10-07 DIAGNOSIS — H35033 Hypertensive retinopathy, bilateral: Secondary | ICD-10-CM

## 2016-10-07 DIAGNOSIS — E113413 Type 2 diabetes mellitus with severe nonproliferative diabetic retinopathy with macular edema, bilateral: Secondary | ICD-10-CM

## 2016-10-07 DIAGNOSIS — I10 Essential (primary) hypertension: Secondary | ICD-10-CM

## 2016-10-07 DIAGNOSIS — H43813 Vitreous degeneration, bilateral: Secondary | ICD-10-CM

## 2016-10-07 DIAGNOSIS — E11311 Type 2 diabetes mellitus with unspecified diabetic retinopathy with macular edema: Secondary | ICD-10-CM

## 2016-10-24 ENCOUNTER — Encounter (INDEPENDENT_AMBULATORY_CARE_PROVIDER_SITE_OTHER): Payer: BLUE CROSS/BLUE SHIELD | Admitting: Ophthalmology

## 2016-10-24 DIAGNOSIS — E11311 Type 2 diabetes mellitus with unspecified diabetic retinopathy with macular edema: Secondary | ICD-10-CM | POA: Diagnosis not present

## 2016-10-24 DIAGNOSIS — E113411 Type 2 diabetes mellitus with severe nonproliferative diabetic retinopathy with macular edema, right eye: Secondary | ICD-10-CM

## 2016-10-26 ENCOUNTER — Other Ambulatory Visit: Payer: Self-pay | Admitting: Emergency Medicine

## 2016-10-26 DIAGNOSIS — K219 Gastro-esophageal reflux disease without esophagitis: Secondary | ICD-10-CM

## 2016-11-04 ENCOUNTER — Encounter (INDEPENDENT_AMBULATORY_CARE_PROVIDER_SITE_OTHER): Payer: BLUE CROSS/BLUE SHIELD | Admitting: Ophthalmology

## 2016-11-04 DIAGNOSIS — I10 Essential (primary) hypertension: Secondary | ICD-10-CM | POA: Diagnosis not present

## 2016-11-04 DIAGNOSIS — E113391 Type 2 diabetes mellitus with moderate nonproliferative diabetic retinopathy without macular edema, right eye: Secondary | ICD-10-CM | POA: Diagnosis not present

## 2016-11-04 DIAGNOSIS — H43813 Vitreous degeneration, bilateral: Secondary | ICD-10-CM

## 2016-11-04 DIAGNOSIS — E11311 Type 2 diabetes mellitus with unspecified diabetic retinopathy with macular edema: Secondary | ICD-10-CM | POA: Diagnosis not present

## 2016-11-04 DIAGNOSIS — E113312 Type 2 diabetes mellitus with moderate nonproliferative diabetic retinopathy with macular edema, left eye: Secondary | ICD-10-CM

## 2016-11-04 DIAGNOSIS — H35033 Hypertensive retinopathy, bilateral: Secondary | ICD-10-CM

## 2016-11-17 ENCOUNTER — Ambulatory Visit: Payer: BLUE CROSS/BLUE SHIELD | Admitting: Endocrinology

## 2016-12-01 ENCOUNTER — Encounter (INDEPENDENT_AMBULATORY_CARE_PROVIDER_SITE_OTHER): Payer: BLUE CROSS/BLUE SHIELD | Admitting: Ophthalmology

## 2016-12-01 DIAGNOSIS — E11311 Type 2 diabetes mellitus with unspecified diabetic retinopathy with macular edema: Secondary | ICD-10-CM | POA: Diagnosis not present

## 2016-12-01 DIAGNOSIS — H43813 Vitreous degeneration, bilateral: Secondary | ICD-10-CM | POA: Diagnosis not present

## 2016-12-01 DIAGNOSIS — I10 Essential (primary) hypertension: Secondary | ICD-10-CM | POA: Diagnosis not present

## 2016-12-01 DIAGNOSIS — H35033 Hypertensive retinopathy, bilateral: Secondary | ICD-10-CM | POA: Diagnosis not present

## 2016-12-01 DIAGNOSIS — H2513 Age-related nuclear cataract, bilateral: Secondary | ICD-10-CM

## 2016-12-01 DIAGNOSIS — E113313 Type 2 diabetes mellitus with moderate nonproliferative diabetic retinopathy with macular edema, bilateral: Secondary | ICD-10-CM | POA: Diagnosis not present

## 2016-12-15 ENCOUNTER — Encounter (INDEPENDENT_AMBULATORY_CARE_PROVIDER_SITE_OTHER): Payer: BLUE CROSS/BLUE SHIELD | Admitting: Ophthalmology

## 2016-12-15 DIAGNOSIS — E113312 Type 2 diabetes mellitus with moderate nonproliferative diabetic retinopathy with macular edema, left eye: Secondary | ICD-10-CM | POA: Diagnosis not present

## 2016-12-15 DIAGNOSIS — E11311 Type 2 diabetes mellitus with unspecified diabetic retinopathy with macular edema: Secondary | ICD-10-CM | POA: Diagnosis not present

## 2016-12-16 DIAGNOSIS — E1140 Type 2 diabetes mellitus with diabetic neuropathy, unspecified: Secondary | ICD-10-CM | POA: Diagnosis not present

## 2016-12-16 DIAGNOSIS — E1151 Type 2 diabetes mellitus with diabetic peripheral angiopathy without gangrene: Secondary | ICD-10-CM | POA: Diagnosis not present

## 2016-12-29 ENCOUNTER — Encounter (INDEPENDENT_AMBULATORY_CARE_PROVIDER_SITE_OTHER): Payer: BLUE CROSS/BLUE SHIELD | Admitting: Ophthalmology

## 2016-12-29 DIAGNOSIS — H2513 Age-related nuclear cataract, bilateral: Secondary | ICD-10-CM | POA: Diagnosis not present

## 2016-12-29 DIAGNOSIS — E113313 Type 2 diabetes mellitus with moderate nonproliferative diabetic retinopathy with macular edema, bilateral: Secondary | ICD-10-CM

## 2016-12-29 DIAGNOSIS — E11311 Type 2 diabetes mellitus with unspecified diabetic retinopathy with macular edema: Secondary | ICD-10-CM | POA: Diagnosis not present

## 2016-12-29 DIAGNOSIS — H35033 Hypertensive retinopathy, bilateral: Secondary | ICD-10-CM

## 2016-12-29 DIAGNOSIS — I10 Essential (primary) hypertension: Secondary | ICD-10-CM | POA: Diagnosis not present

## 2016-12-29 DIAGNOSIS — H43813 Vitreous degeneration, bilateral: Secondary | ICD-10-CM

## 2017-01-26 ENCOUNTER — Encounter (INDEPENDENT_AMBULATORY_CARE_PROVIDER_SITE_OTHER): Payer: BLUE CROSS/BLUE SHIELD | Admitting: Ophthalmology

## 2017-01-26 DIAGNOSIS — H35033 Hypertensive retinopathy, bilateral: Secondary | ICD-10-CM | POA: Diagnosis not present

## 2017-01-26 DIAGNOSIS — I10 Essential (primary) hypertension: Secondary | ICD-10-CM

## 2017-01-26 DIAGNOSIS — E113313 Type 2 diabetes mellitus with moderate nonproliferative diabetic retinopathy with macular edema, bilateral: Secondary | ICD-10-CM | POA: Diagnosis not present

## 2017-01-26 DIAGNOSIS — E11311 Type 2 diabetes mellitus with unspecified diabetic retinopathy with macular edema: Secondary | ICD-10-CM | POA: Diagnosis not present

## 2017-01-26 DIAGNOSIS — H43813 Vitreous degeneration, bilateral: Secondary | ICD-10-CM | POA: Diagnosis not present

## 2017-01-29 ENCOUNTER — Other Ambulatory Visit: Payer: Self-pay | Admitting: Physician Assistant

## 2017-01-29 ENCOUNTER — Other Ambulatory Visit: Payer: Self-pay | Admitting: Endocrinology

## 2017-01-29 DIAGNOSIS — K219 Gastro-esophageal reflux disease without esophagitis: Secondary | ICD-10-CM

## 2017-01-29 NOTE — Telephone Encounter (Signed)
Attempted to contact pt regarding medication refill and the need for O.;

## 2017-01-29 NOTE — Telephone Encounter (Signed)
Does pt need appt before refill

## 2017-03-05 ENCOUNTER — Encounter (INDEPENDENT_AMBULATORY_CARE_PROVIDER_SITE_OTHER): Payer: BLUE CROSS/BLUE SHIELD | Admitting: Ophthalmology

## 2017-03-05 DIAGNOSIS — H43813 Vitreous degeneration, bilateral: Secondary | ICD-10-CM | POA: Diagnosis not present

## 2017-03-05 DIAGNOSIS — H35033 Hypertensive retinopathy, bilateral: Secondary | ICD-10-CM | POA: Diagnosis not present

## 2017-03-05 DIAGNOSIS — I10 Essential (primary) hypertension: Secondary | ICD-10-CM | POA: Diagnosis not present

## 2017-03-05 DIAGNOSIS — E11311 Type 2 diabetes mellitus with unspecified diabetic retinopathy with macular edema: Secondary | ICD-10-CM

## 2017-03-05 DIAGNOSIS — E113313 Type 2 diabetes mellitus with moderate nonproliferative diabetic retinopathy with macular edema, bilateral: Secondary | ICD-10-CM | POA: Diagnosis not present

## 2017-03-09 DIAGNOSIS — E1140 Type 2 diabetes mellitus with diabetic neuropathy, unspecified: Secondary | ICD-10-CM | POA: Diagnosis not present

## 2017-03-09 DIAGNOSIS — E1151 Type 2 diabetes mellitus with diabetic peripheral angiopathy without gangrene: Secondary | ICD-10-CM | POA: Diagnosis not present

## 2017-03-26 DIAGNOSIS — E113493 Type 2 diabetes mellitus with severe nonproliferative diabetic retinopathy without macular edema, bilateral: Secondary | ICD-10-CM | POA: Diagnosis not present

## 2017-03-26 DIAGNOSIS — H25813 Combined forms of age-related cataract, bilateral: Secondary | ICD-10-CM | POA: Diagnosis not present

## 2017-04-01 ENCOUNTER — Other Ambulatory Visit: Payer: Self-pay | Admitting: Physician Assistant

## 2017-04-01 DIAGNOSIS — E1140 Type 2 diabetes mellitus with diabetic neuropathy, unspecified: Secondary | ICD-10-CM

## 2017-04-02 ENCOUNTER — Telehealth: Payer: Self-pay | Admitting: Physician Assistant

## 2017-04-02 DIAGNOSIS — E1140 Type 2 diabetes mellitus with diabetic neuropathy, unspecified: Secondary | ICD-10-CM

## 2017-04-02 MED ORDER — METFORMIN HCL 1000 MG PO TABS: 1000.0000 mg | ORAL_TABLET | Freq: Two times a day (BID) | ORAL | 0 refills | Status: DC

## 2017-04-02 NOTE — Telephone Encounter (Signed)
Contacted pt regarding refill request for metformin; office visit required for additional refills; pt offered and accepted appointment with Philis Fendt on 04/03/17 at 0900; pt verbalizes understanding.

## 2017-04-03 ENCOUNTER — Encounter: Payer: Self-pay | Admitting: Physician Assistant

## 2017-04-03 ENCOUNTER — Ambulatory Visit: Payer: BLUE CROSS/BLUE SHIELD | Admitting: Physician Assistant

## 2017-04-03 ENCOUNTER — Other Ambulatory Visit: Payer: Self-pay

## 2017-04-03 VITALS — BP 122/68 | HR 111 | Temp 98.8°F | Resp 18 | Ht 70.5 in | Wt 230.2 lb

## 2017-04-03 DIAGNOSIS — I2584 Coronary atherosclerosis due to calcified coronary lesion: Secondary | ICD-10-CM

## 2017-04-03 DIAGNOSIS — I4510 Unspecified right bundle-branch block: Secondary | ICD-10-CM | POA: Diagnosis not present

## 2017-04-03 DIAGNOSIS — G8929 Other chronic pain: Secondary | ICD-10-CM | POA: Diagnosis not present

## 2017-04-03 DIAGNOSIS — R000 Tachycardia, unspecified: Secondary | ICD-10-CM

## 2017-04-03 DIAGNOSIS — I2510 Atherosclerotic heart disease of native coronary artery without angina pectoris: Secondary | ICD-10-CM

## 2017-04-03 DIAGNOSIS — E1140 Type 2 diabetes mellitus with diabetic neuropathy, unspecified: Secondary | ICD-10-CM | POA: Diagnosis not present

## 2017-04-03 DIAGNOSIS — Z1159 Encounter for screening for other viral diseases: Secondary | ICD-10-CM | POA: Insufficient documentation

## 2017-04-03 MED ORDER — TRAMADOL HCL 50 MG PO TABS: 50.0000 mg | ORAL_TABLET | Freq: Every day | ORAL | 0 refills | Status: DC

## 2017-04-03 MED ORDER — GABAPENTIN 300 MG PO CAPS: 900.0000 mg | ORAL_CAPSULE | Freq: Three times a day (TID) | ORAL | 3 refills | Status: DC

## 2017-04-03 MED ORDER — METFORMIN HCL 1000 MG PO TABS: 1000.0000 mg | ORAL_TABLET | Freq: Two times a day (BID) | ORAL | 0 refills | Status: DC

## 2017-04-03 MED ORDER — ROSUVASTATIN CALCIUM 5 MG PO TABS: 5.0000 mg | ORAL_TABLET | Freq: Every day | ORAL | 3 refills | Status: DC

## 2017-04-03 MED ORDER — CANAGLIFLOZIN 300 MG PO TABS: ORAL_TABLET | ORAL | 0 refills | Status: DC

## 2017-04-03 NOTE — Progress Notes (Signed)
    04/03/2017 9:35 AM   DOB: 05/02/1950 / MRN: [id number]  SUBJECTIVE:  [name] is a 67 y.o. male presenting for   He is allergic to cymbalta [duloxetine hcl] and darvocet [propoxyphene n-acetaminophen].   He  has a past medical history of Cancer ([place]), Diabetes mellitus without complication ([place]), GERD (gastroesophageal reflux disease), Hyperlipidemia, Hypertension, Myocardial infarction ([place]), and OSA (obstructive sleep apnea).    He  reports that  has never smoked. His smokeless tobacco use includes chew. He reports that he drinks alcohol. He reports that he does not use drugs. He  reports that he does not currently engage in sexual activity. The patient  has a past surgical history that includes Cholecystectomy and Prostate surgery.  His family history includes Cancer in his maternal grandfather, mother, and paternal grandfather; Cancer (age of onset: 73) in his father; Diabetes in his mother; Heart disease in his maternal grandmother and paternal grandmother.  ROS  The problem list and medications were reviewed and updated by myself where necessary and exist elsewhere in the encounter.   OBJECTIVE:  BP 122/68 (BP Location: Left Arm, Patient Position: Sitting, Cuff Size: Large)   Pulse (!) 111   Temp 98.8 F (37.1 C) (Oral)   Resp 18   Ht 5' 10.5" (1.791 m)   Wt 230 lb 3.2 oz (104.4 kg)   SpO2 96%   BMI 32.56 kg/m   Physical Exam   BP Readings from Last 3 Encounters:  04/03/17 122/68  10/02/16 138/80  07/31/16 134/82   Pulse Readings from Last 3 Encounters:  04/03/17 (!) 111  10/02/16 77  07/31/16 93    No results found for this or any previous visit (from the past 72 hour(s)).  No results found.  ASSESSMENT AND PLAN:  [name] was seen today for leg pain and medication refill.  Diagnoses and all orders for this visit:  Type 2 diabetes mellitus with diabetic neuropathy, without long-term current use of insulin ([place])  Need for hepatitis C screening  test    The patient is advised to call or return to clinic if he does not see an improvement in symptoms, or to seek the care of the closest emergency department if he worsens with the above plan.   Philis Fendt, MHS, PA-C [facility] 04/03/2017 9:35 AM

## 2017-04-03 NOTE — Patient Instructions (Addendum)
  Please call Dr. Ronnie Derby office.  It is important that she keep her follow-ups with him.   Please call the offices of [facility].  They have the orthopedic specialist that have seen you in the past.  For now try to take 1000 mg of Tylenol every 8 hours for pain.  I refilled her gabapentin you can also take the tramadol at night as this has worked well for him in the past.   Come back here in 6 weeks for an appointment.    IF you received an x-ray today, you will receive an invoice from [facility]. Please contact [facility] at [phone] with questions or concerns regarding your invoice.   IF you received labwork today, you will receive an invoice from [facility]. Please contact [facility] at [phone] with questions or concerns regarding your invoice.   Our billing staff will not be able to assist you with questions regarding bills from these companies.  You will be contacted with the lab results as soon as they are available. The fastest way to get your results is to activate your My Chart account. Instructions are located on the last page of this paperwork. If you have not heard from [place] regarding the results in 2 weeks, please contact this office.

## 2017-04-03 NOTE — Progress Notes (Signed)
04/03/2017 10:11 AM   DOB: [birth year] / MRN: [id number]  SUBJECTIVE:  [name] is a 67 y.o. male presenting for follow-up of several problems.  Complains about his chronic pain in his knees and his hips.  He seen orthopedics in the past is willing to go back there for discussion of this problem.  Tells me the tramadol helped him feel better.  He is not taking Tylenol.  He is almost out of gabapentin.  He is missed his last appointment with Dr. Dwyane Dee.  He is requesting refills of his metformin and Invokana today.  Is okay with calling Dwyane Dee and getting back in as quickly as possible.  He is allergic to cymbalta [duloxetine hcl] and darvocet [propoxyphene n-acetaminophen].   He  has a past medical history of Cancer ([place]), Diabetes mellitus without complication ([place]), GERD (gastroesophageal reflux disease), Hyperlipidemia, Hypertension, Myocardial infarction ([place]), and OSA (obstructive sleep apnea).    He  reports that  has never smoked. His smokeless tobacco use includes chew. He reports that he drinks alcohol. He reports that he does not use drugs. He  reports that he does not currently engage in sexual activity. The patient  has a past surgical history that includes Cholecystectomy and Prostate surgery.  His family history includes Cancer in his maternal grandfather, mother, and paternal grandfather; Cancer (age of onset: 54) in his father; Diabetes in his mother; Heart disease in his maternal grandmother and paternal grandmother.  Review of Systems  Constitutional: Negative for chills, diaphoresis and fever.  Respiratory: Negative for cough, hemoptysis, sputum production, shortness of breath and wheezing.   Cardiovascular: Negative for chest pain, orthopnea and leg swelling.  Gastrointestinal: Negative for nausea.  Musculoskeletal: Positive for back pain, joint pain, myalgias and neck pain.  Skin: Negative for rash.  Neurological: Negative for dizziness.    The problem list  and medications were reviewed and updated by myself where necessary and exist elsewhere in the encounter.   OBJECTIVE:  BP 122/68 (BP Location: Left Arm, Patient Position: Sitting, Cuff Size: Large)   Pulse (!) 111   Temp 98.8 F (37.1 C) (Oral)   Resp 18   Ht 5' 10.5" (1.791 m)   Wt 230 lb 3.2 oz (104.4 kg)   SpO2 96%   BMI 32.56 kg/m   Physical Exam  Constitutional: He appears well-developed. He is active and cooperative.  Non-toxic appearance.  Cardiovascular: S1 normal, S2 normal, normal heart sounds, intact distal pulses and normal pulses.  No extrasystoles are present. Tachycardia present. Exam reveals no gallop and no friction rub.  No murmur heard. Pulmonary/Chest: Effort normal. No tachypnea. He has no rales.  Abdominal: He exhibits no distension.  Musculoskeletal: He exhibits no edema.  Neurological: He is alert.  Skin: Skin is warm and dry. He is not diaphoretic. No pallor.  Vitals reviewed.  The 10-year ASCVD risk score [name] DC Brooke Bonito., et al., 2013) is: 20.7%   Values used to calculate the score:     Age: 58 years     Sex: Male     Is Non-Hispanic African American: No     Diabetic: Yes     Tobacco smoker: No     Systolic Blood Pressure: 846 mmHg     Is BP treated: No     HDL Cholesterol: 54.2 mg/dL     Total Cholesterol: 225 mg/dL   No results found for this or any previous visit (from the past 72 hour(s)).  No results found.  ASSESSMENT AND PLAN:  [name] was seen today for leg pain and medication refill.  Diagnoses and all orders for this visit:  Tachycardia: He is negative for chest pain, shortness of breath, dizziness at this time.  Blood pressure is controlled.  He does not smoke.  EKG does show tachycardia however there are no significant changes from his last EKG.  Please see the fourth problem for the plan. -     EKG 12-Lead -     TSH -     Basic metabolic panel -     POCT urinalysis dipstick -     Ambulatory referral to Cardiology  Type 2  diabetes mellitus with diabetic neuropathy, without long-term current use of insulin (HCC) -     canagliflozin (INVOKANA) 300 MG TABS tablet; TAKE 1 TABLET (300 MG TOTAL) BY MOUTH DAILY BEFORE BREAKFAST. -     metFORMIN (GLUCOPHAGE) 1000 MG tablet; Take 1 tablet (1,000 mg total) by mouth 2 (two) times daily.  Other chronic pain -     gabapentin (NEURONTIN) 300 MG capsule; Take 3 capsules (900 mg total) by mouth 3 (three) times daily. -     traMADol (ULTRAM) 50 MG tablet; Take 1 tablet (50 mg total) by mouth at bedtime.  Right bundle branch block: He needs to follow-up with cardiology.  He was last seen in 2016 and has known coronary artery calcification.  I will get him back into the cardiology group.  Of note he has an exquisitely high ASCVD score.  He is not taking a statin at this time.  I am starting that today.  He needs to come to appointments more regularly.  He has several complaints.  I will try to see him back in 6 weeks to ensure that he is getting good continuity of care. -     Ambulatory referral to Cardiology  Coronary artery calcification -     Ambulatory referral to Cardiology    The patient is advised to call or return to clinic if he does not see an improvement in symptoms, or to seek the care of the closest emergency department if he worsens with the above plan.   Philis Fendt, MHS, PA-C [facility] 04/03/2017 10:11 AM

## 2017-04-04 LAB — BASIC METABOLIC PANEL
BUN/Creatinine Ratio: 15 (ref 10–24)
BUN: 12 mg/dL (ref 8–27)
CALCIUM: 9.6 mg/dL (ref 8.6–10.2)
CHLORIDE: 101 mmol/L (ref 96–106)
CO2: 21 mmol/L (ref 20–29)
Creatinine, Ser: 0.82 mg/dL (ref 0.76–1.27)
GFR calc non Af Amer: 93 mL/min/{1.73_m2} (ref >59)
GFR, EST AFRICAN AMERICAN: 108 mL/min/{1.73_m2} (ref >59)
Glucose: 145 mg/dL — ABNORMAL HIGH (ref 65–99)
Potassium: 4.3 mmol/L (ref 3.5–5.2)
Sodium: 139 mmol/L (ref 134–144)

## 2017-04-04 LAB — TSH: TSH: 1.29 u[IU]/mL (ref 0.450–4.500)

## 2017-04-06 ENCOUNTER — Telehealth: Payer: Self-pay

## 2017-04-06 NOTE — Telephone Encounter (Signed)
Filled out PA on cover my meds.  Tramadol was approved from 04/06/2017-05/05/2017.  LMVM advising patient.

## 2017-04-17 ENCOUNTER — Encounter (INDEPENDENT_AMBULATORY_CARE_PROVIDER_SITE_OTHER): Payer: BLUE CROSS/BLUE SHIELD | Admitting: Ophthalmology

## 2017-04-17 DIAGNOSIS — M170 Bilateral primary osteoarthritis of knee: Secondary | ICD-10-CM | POA: Diagnosis not present

## 2017-04-20 ENCOUNTER — Encounter (INDEPENDENT_AMBULATORY_CARE_PROVIDER_SITE_OTHER): Payer: BLUE CROSS/BLUE SHIELD | Admitting: Ophthalmology

## 2017-04-20 DIAGNOSIS — E11311 Type 2 diabetes mellitus with unspecified diabetic retinopathy with macular edema: Secondary | ICD-10-CM | POA: Diagnosis not present

## 2017-04-20 DIAGNOSIS — E113313 Type 2 diabetes mellitus with moderate nonproliferative diabetic retinopathy with macular edema, bilateral: Secondary | ICD-10-CM | POA: Diagnosis not present

## 2017-04-20 DIAGNOSIS — H43813 Vitreous degeneration, bilateral: Secondary | ICD-10-CM | POA: Diagnosis not present

## 2017-04-20 DIAGNOSIS — H35033 Hypertensive retinopathy, bilateral: Secondary | ICD-10-CM

## 2017-04-20 DIAGNOSIS — I10 Essential (primary) hypertension: Secondary | ICD-10-CM | POA: Diagnosis not present

## 2017-04-20 DIAGNOSIS — H2513 Age-related nuclear cataract, bilateral: Secondary | ICD-10-CM

## 2017-04-29 ENCOUNTER — Other Ambulatory Visit: Payer: Self-pay | Admitting: Physician Assistant

## 2017-04-29 DIAGNOSIS — K219 Gastro-esophageal reflux disease without esophagitis: Secondary | ICD-10-CM

## 2017-05-15 ENCOUNTER — Ambulatory Visit: Payer: BLUE CROSS/BLUE SHIELD | Admitting: Physician Assistant

## 2017-05-18 DIAGNOSIS — E1151 Type 2 diabetes mellitus with diabetic peripheral angiopathy without gangrene: Secondary | ICD-10-CM | POA: Diagnosis not present

## 2017-05-18 DIAGNOSIS — E1140 Type 2 diabetes mellitus with diabetic neuropathy, unspecified: Secondary | ICD-10-CM | POA: Diagnosis not present

## 2017-05-18 DIAGNOSIS — J321 Chronic frontal sinusitis: Secondary | ICD-10-CM | POA: Diagnosis not present

## 2017-05-29 ENCOUNTER — Encounter (INDEPENDENT_AMBULATORY_CARE_PROVIDER_SITE_OTHER): Payer: BLUE CROSS/BLUE SHIELD | Admitting: Ophthalmology

## 2017-05-29 IMAGING — DX DG FOOT COMPLETE 3+V*R*
3 series · 3 of 3 positions shown · non-contrast
Comparison: None.

CLINICAL DATA: Right ankle and foot pain.

EXAM:
RIGHT FOOT COMPLETE - 3+ VIEW

[foot ap]
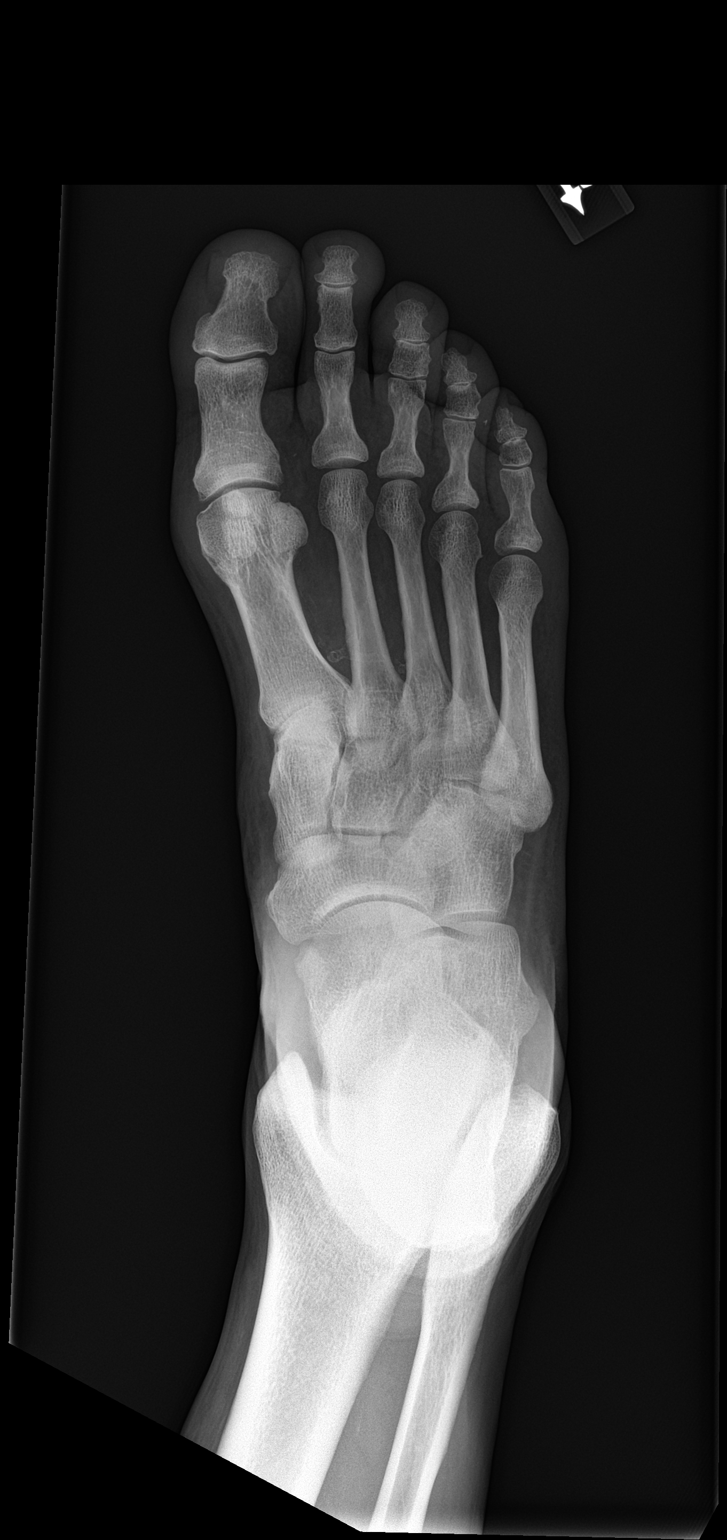

[foot obl]
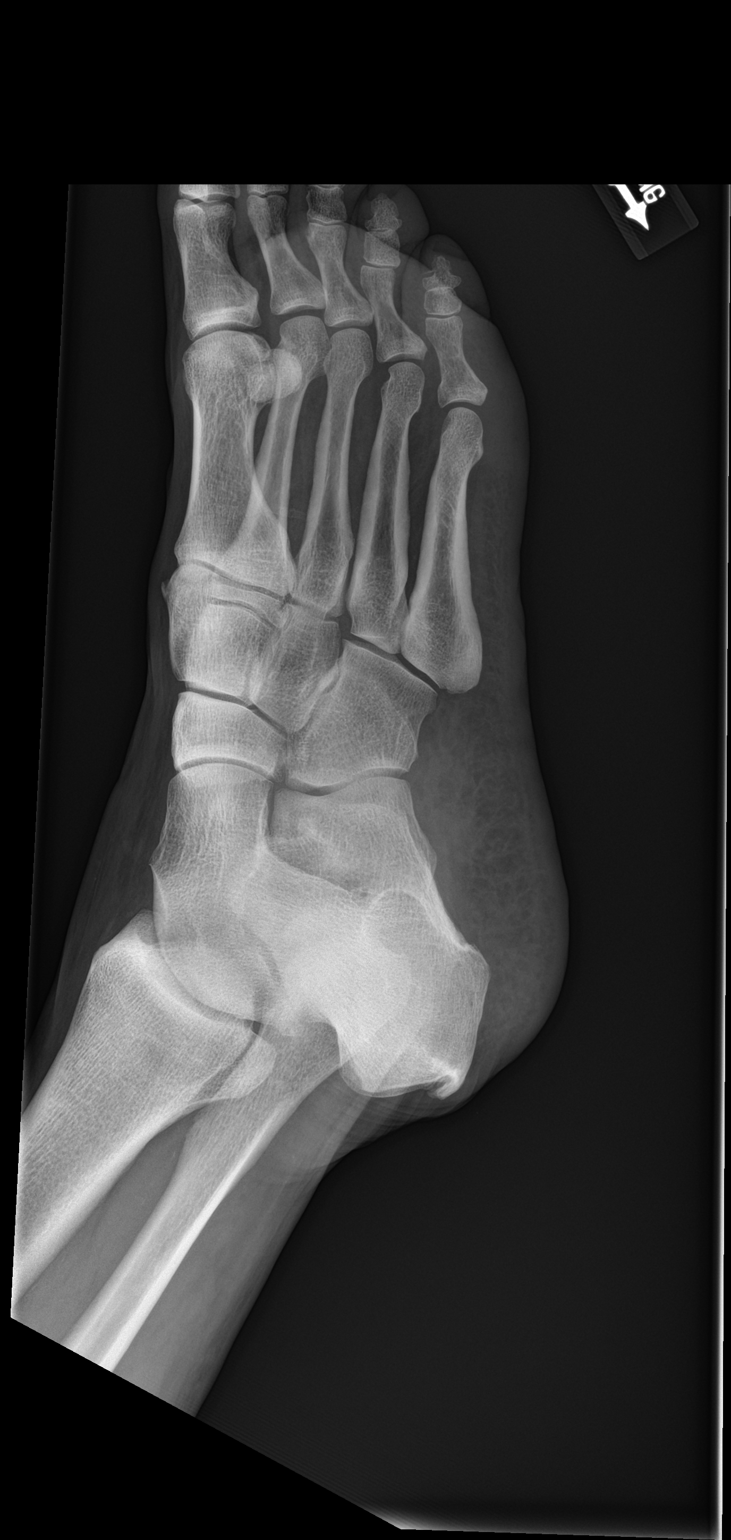

[foot lat]
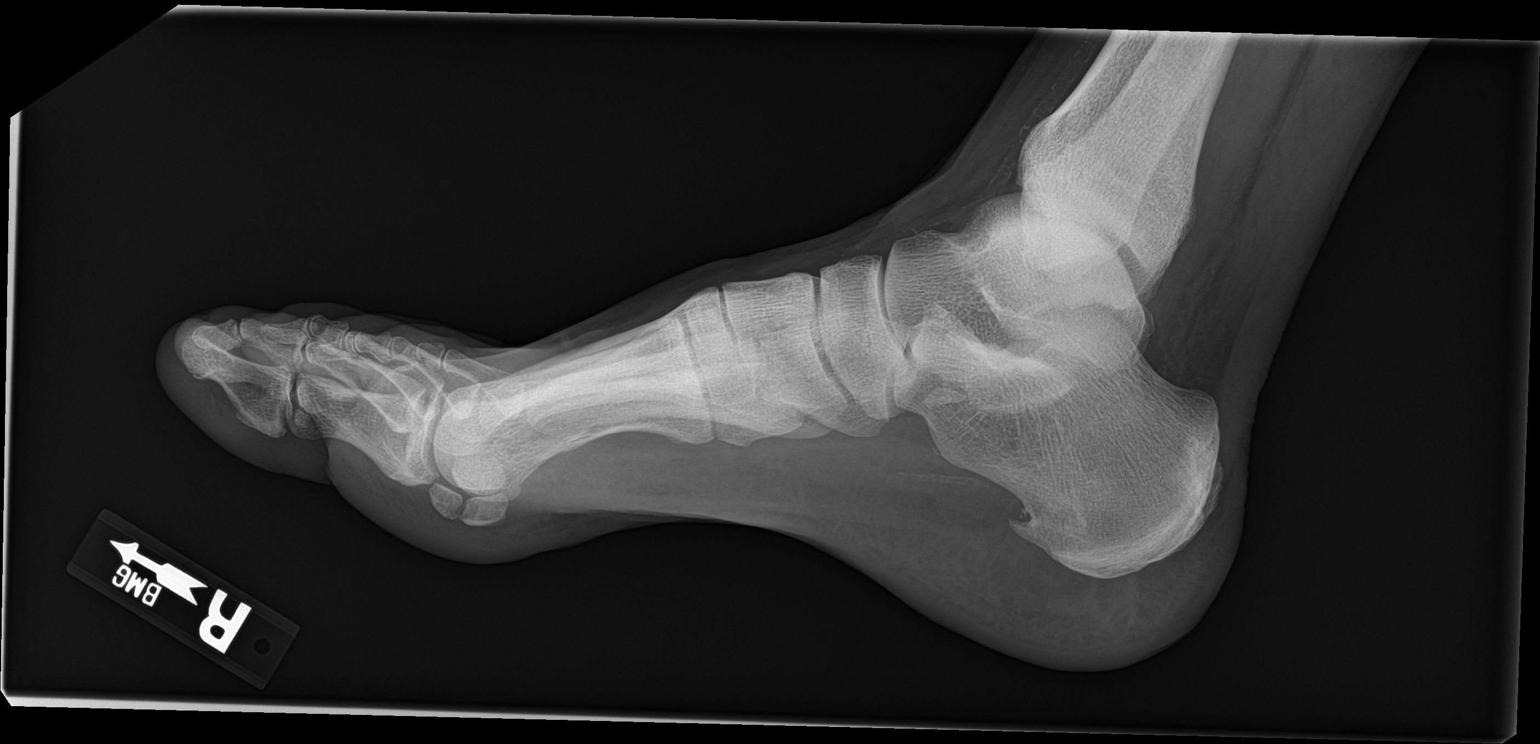

[3 of 3 positions shown; findings below may reference images not displayed]

FINDINGS: Early degenerative changes in the right first MTP joint. Early joint
space narrowing and spurring. No acute bony abnormality.
Specifically, no fracture, subluxation, or dislocation. Soft tissues
are intact. Small plantar calcaneal spur.
IMPRESSION: No acute bony abnormality.

## 2017-06-03 ENCOUNTER — Other Ambulatory Visit: Payer: Self-pay | Admitting: *Deleted

## 2017-06-03 ENCOUNTER — Other Ambulatory Visit (INDEPENDENT_AMBULATORY_CARE_PROVIDER_SITE_OTHER): Payer: BLUE CROSS/BLUE SHIELD

## 2017-06-03 ENCOUNTER — Other Ambulatory Visit: Payer: Self-pay | Admitting: Endocrinology

## 2017-06-03 DIAGNOSIS — E1165 Type 2 diabetes mellitus with hyperglycemia: Secondary | ICD-10-CM | POA: Diagnosis not present

## 2017-06-03 DIAGNOSIS — E1140 Type 2 diabetes mellitus with diabetic neuropathy, unspecified: Secondary | ICD-10-CM

## 2017-06-03 LAB — LIPID PANEL
Cholesterol: 210 mg/dL — ABNORMAL HIGH (ref 0–200)
HDL: 68.7 mg/dL (ref >39.00)
LDL Cholesterol: 117 mg/dL — ABNORMAL HIGH (ref 0–99)
NonHDL: 141.43
TRIGLYCERIDES: 122 mg/dL (ref 0.0–149.0)
Total CHOL/HDL Ratio: 3
VLDL: 24.4 mg/dL (ref 0.0–40.0)

## 2017-06-03 LAB — COMPREHENSIVE METABOLIC PANEL
ALBUMIN: 3.9 g/dL (ref 3.5–5.2)
ALT: 22 U/L (ref 0–53)
AST: 20 U/L (ref 0–37)
Alkaline Phosphatase: 72 U/L (ref 39–117)
BILIRUBIN TOTAL: 0.6 mg/dL (ref 0.2–1.2)
BUN: 16 mg/dL (ref 6–23)
CALCIUM: 8.9 mg/dL (ref 8.4–10.5)
CO2: 28 mEq/L (ref 19–32)
CREATININE: 0.73 mg/dL (ref 0.40–1.50)
Chloride: 104 mEq/L (ref 96–112)
GFR: 114.83 mL/min (ref >60.00)
Glucose, Bld: 121 mg/dL — ABNORMAL HIGH (ref 70–99)
Potassium: 4 mEq/L (ref 3.5–5.1)
Sodium: 138 mEq/L (ref 135–145)
Total Protein: 6.6 g/dL (ref 6.0–8.3)

## 2017-06-03 LAB — HEMOGLOBIN A1C: HEMOGLOBIN A1C: 7.5 % — AB (ref 4.6–6.5)

## 2017-06-03 MED ORDER — METFORMIN HCL 1000 MG PO TABS: 1000.0000 mg | ORAL_TABLET | Freq: Two times a day (BID) | ORAL | 0 refills | Status: DC

## 2017-06-05 ENCOUNTER — Encounter: Payer: Self-pay | Admitting: Endocrinology

## 2017-06-05 ENCOUNTER — Ambulatory Visit: Payer: BLUE CROSS/BLUE SHIELD | Admitting: Endocrinology

## 2017-06-05 VITALS — BP 146/80 | HR 100 | Ht 70.5 in | Wt 227.0 lb

## 2017-06-05 DIAGNOSIS — E1142 Type 2 diabetes mellitus with diabetic polyneuropathy: Secondary | ICD-10-CM

## 2017-06-05 DIAGNOSIS — E1165 Type 2 diabetes mellitus with hyperglycemia: Secondary | ICD-10-CM

## 2017-06-05 DIAGNOSIS — E7800 Pure hypercholesterolemia, unspecified: Secondary | ICD-10-CM

## 2017-06-05 NOTE — Progress Notes (Signed)
Patient ID: [name], male   DOB: [birth year], 67 y.o.   MRN: [id number]           Reason for Appointment: Follow-up for Type 2 Diabetes   History of Present Illness:          Date of diagnosis of type 2 diabetes mellitus:?  2002       Background history:   He thinks he had increased thirst at the time of diagnosis which was probably 12-14 years ago He was treated with metformin for several years Subsequently Amaryl was added and this has been continued Previous level of control is unknown, records available only since 2013  He previously had poorly controlled diabetes and A1c consistently over 9%  Recent history:     He has not been seen in follow-up since 10/2016 His A1c was 7.5, previously done in 5/18 at 7.8   Non-insulin hypoglycemic drugs the patient is taking are: Metformin 1 g twice a day.  Amaryl 54m in a.m.  Invokana 300 mg daily   Current management, blood sugar patterns and problems identified:  He has not been back in follow-up and not checking his blood sugars until this week.  This is because of his busy work schedule  He continues to lose weight with staying regular on [place] Also he is still trying to eat more healthy including when eating out  Also is fairly active with work daily  All the fasting glucose was 153 today he thinks this is from drinking milk during the night.  He is generally cutting back on drinking alcohol compared to previous visit but will occasionally have a regular soft drinks  He is not checking blood sugars after supper and not clear when his blood sugar may be higher  However he thinks that because of lack of prescription refills he was only taking 1 tablet of metformin instead of twice daily until he got a refill recently  Highest blood sugar 188 at around noon but otherwise has checked his blood sugar on only 3 days recently        Side effects from medications have been: None  Compliance with the medical  regimen: Fair Hypoglycemia: None    Glucose monitoring:  done  1-2  times a day         Glucometer: Contour Blood Glucose readings by download as above   Self-care: The diet that the patient has been following is: None reducing fats He is reducing fruit juices such as grapefruit and cranberry    Meal times are:  Breakfast is usually skipped Typical meal intake: Meat and 2 vegetables at lunch, snacks or fruits at dinner               Dietician visit, most recent: Never               Exercise:  none except walking at work and climbing stairs  Weight history:  Wt Readings from Last 3 Encounters:  06/05/17 227 lb (103 kg)  04/03/17 230 lb 3.2 oz (104.4 kg)  10/02/16 236 lb (107 kg)    Glycemic control:   Lab Results  Component Value Date   HGBA1C 7.5 (H) 06/03/2017   HGBA1C 7.8 07/31/2016   HGBA1C 6.8 03/28/2016   Lab Results  Component Value Date   MICROALBUR 1.4 10/02/2016   LDLCALC 117 (H) 06/03/2017   CREATININE 0.73 06/03/2017   Lab Results  Component Value Date   MICRALBCREAT 1.1 10/02/2016  Allergies as of 06/05/2017      Reactions   Cymbalta [duloxetine Hcl] Nausea And Vomiting   Darvocet [propoxyphene N-acetaminophen] Hives      Medication List        Accurate as of 06/05/17  4:07 PM. Always use your most recent med list.          aspirin 81 MG tablet Take 81 mg by mouth daily.   azelastine 0.1 % nasal spray Commonly known as:  ASTELIN PLACE 2 SPRAYS INTO BOTH NOSTRILS 2 (TWO) TIMES DAILY. USE IN EACH NOSTRIL AS DIRECTED   BAYER CONTOUR NEXT MONITOR w/Device Kit Check blood sugar two times daily.   BAYER CONTOUR NEXT TEST test strip Generic drug:  glucose blood CHECK BLOOD SUGAR TWICE DAILY   BAYER MICROLET LANCETS lancets Check blood sugar two times daily.   canagliflozin 300 MG Tabs tablet Commonly known as:  INVOKANA TAKE 1 TABLET (300 MG TOTAL) BY MOUTH DAILY BEFORE BREAKFAST.   cetirizine 10 MG tablet Commonly known as:   ZYRTEC Take 1 tablet (10 mg total) by mouth daily.   FLUARIX QUADRIVALENT 0.5 ML injection Generic drug:  Influenza vac split quadrivalent PF TO BE ADMINISTERED BY PHARMACIST FOR IMMUNIZATION   fluvastatin XL 80 MG 24 hr tablet Commonly known as:  LESCOL XL Take 1 tablet (80 mg total) by mouth daily.   gabapentin 300 MG capsule Commonly known as:  NEURONTIN Take 3 capsules (900 mg total) by mouth 3 (three) times daily.   glimepiride 4 MG tablet Commonly known as:  AMARYL TAKE 1 TABLET (4 MG TOTAL) BY MOUTH 2 (TWO) TIMES DAILY BEFORE A MEAL.   metFORMIN 1000 MG tablet Commonly known as:  GLUCOPHAGE Take 1 tablet (1,000 mg total) by mouth 2 (two) times daily.   omeprazole 20 MG capsule Commonly known as:  PRILOSEC TAKE 1 CAPSULE (20 MG TOTAL) BY MOUTH DAILY. OV NEEDED FOR ADDITIONAL REFILLS   rosuvastatin 5 MG tablet Commonly known as:  CRESTOR Take 1 tablet (5 mg total) by mouth daily.   traMADol 50 MG tablet Commonly known as:  ULTRAM Take 1 tablet (50 mg total) by mouth at bedtime.   vitamin B-12 1000 MCG tablet Commonly known as:  CYANOCOBALAMIN Take 1,000 mcg by mouth daily.       Allergies:  Allergies  Allergen Reactions  . Cymbalta [Duloxetine Hcl] Nausea And Vomiting  . Darvocet [Propoxyphene N-Acetaminophen] Hives    Past Medical History:  Diagnosis Date  . Cancer [facility])    Prostate cancer age 5; Lupron treatments followed by prostatectomy.  . Diabetes mellitus without complication ([place])   . GERD (gastroesophageal reflux disease)   . Hyperlipidemia   . Hypertension   . Myocardial infarction [facility])    age 26; no stenting.  . OSA (obstructive sleep apnea)    non-compliant with CPAP.    Past Surgical History:  Procedure Laterality Date  . CHOLECYSTECTOMY    . PROSTATE SURGERY      Family History  Problem Relation Age of Onset  . Cancer Mother        leukemia  . Diabetes Mother   . Cancer Father 61       lung cancer with mets  . Heart  disease Maternal Grandmother   . Cancer Maternal Grandfather   . Heart disease Paternal Grandmother   . Cancer Paternal Grandfather     Social History:  reports that he has never smoked. His smokeless tobacco use includes chew. He reports that he  drinks alcohol. He reports that he does not use drugs.   Review of Systems   Lipid history: Followed by PCP  Triglycerides improved However LDL is higher and he has not taken fluvastatin or the recently prescribed Crestor  He thinks he gets weakness from statin drugs   Lab Results  Component Value Date   CHOL 210 (H) 06/03/2017   HDL 68.70 06/03/2017   LDLCALC 117 (H) 06/03/2017   LDLDIRECT 143.0 10/02/2016   TRIG 122.0 06/03/2017   CHOLHDL 3 06/03/2017            Most recent eye exam was 4/19  NEUROPATHY: He Is  taking 900 mg tid with variable relief of his symptoms  He says he is using an over-the-counter liquid preparation on his joints and feet derived from hemp and is getting relief   He was found by his ophthalmologist to have severe retinopathy and probably also macular edema for which he is getting injections now    Physical Examination:  BP (!) 146/80 (BP Location: Left Arm, Patient Position: Sitting, Cuff Size: Normal)   Pulse 100   Ht 5' 10.5" (1.791 m)   Wt 227 lb (103 kg)   SpO2 97%   BMI 32.11 kg/m       ASSESSMENT:  Diabetes type 2, recent BMI 32  See history of present illness for detailed discussion of current diabetes management, blood sugar patterns and problems identified  His A1c is 7.5  Although he has generally done well with adding Invokana to Amaryl and metformin he is not getting consistent control This is partly related to his not taking metformin recently Difficult to know whether he is getting consistent hypoglycemia since he is monitoring much at home except this week He has lost some weight further   NEUROPATHY: He is trying and over-the-counter topical treatment with some  relief  HYPERLIPIDEMIA: He has consistently high LDL And triglyceride better with cutting back on alcohol intake  PLAN:    To take medications consistently  More regular follow-up  To try checking blood sugars after evening meal more consistently  We will adjust his medication based on blood sugar patterns on the next visit For his hypercholesterolemia since he did tolerate 20 mg fluvastatin previously he can try taking the Lescol XL every other day   Patient Instructions  Check blood sugars on waking up  3/7  Also check blood sugars about 2 hours after a meal and do this after different meals by rotation  Recommended blood sugar levels on waking up is 90-130 and about 2 hours after meal is 130-160  Please bring your blood sugar monitor to each visit, thank you  Restart Fluvastatin every 2 days       Elayne Snare 06/05/2017, 4:07 PM   Note: This office note was prepared with Dragon voice recognition system technology. Any transcriptional errors that result from this process are unintentional.

## 2017-06-05 NOTE — Patient Instructions (Addendum)
Check blood sugars on waking up  3/7  Also check blood sugars about 2 hours after a meal and do this after different meals by rotation  Recommended blood sugar levels on waking up is 90-130 and about 2 hours after meal is 130-160  Please bring your blood sugar monitor to each visit, thank you  Restart Fluvastatin every 2 days

## 2017-06-08 ENCOUNTER — Encounter (INDEPENDENT_AMBULATORY_CARE_PROVIDER_SITE_OTHER): Payer: BLUE CROSS/BLUE SHIELD | Admitting: Ophthalmology

## 2017-06-08 DIAGNOSIS — I10 Essential (primary) hypertension: Secondary | ICD-10-CM

## 2017-06-08 DIAGNOSIS — H2513 Age-related nuclear cataract, bilateral: Secondary | ICD-10-CM | POA: Diagnosis not present

## 2017-06-08 DIAGNOSIS — H35033 Hypertensive retinopathy, bilateral: Secondary | ICD-10-CM

## 2017-06-08 DIAGNOSIS — E113313 Type 2 diabetes mellitus with moderate nonproliferative diabetic retinopathy with macular edema, bilateral: Secondary | ICD-10-CM

## 2017-06-08 DIAGNOSIS — H43813 Vitreous degeneration, bilateral: Secondary | ICD-10-CM | POA: Diagnosis not present

## 2017-06-08 DIAGNOSIS — E11311 Type 2 diabetes mellitus with unspecified diabetic retinopathy with macular edema: Secondary | ICD-10-CM | POA: Diagnosis not present

## 2017-06-22 DIAGNOSIS — M79672 Pain in left foot: Secondary | ICD-10-CM | POA: Diagnosis not present

## 2017-06-22 DIAGNOSIS — M25579 Pain in unspecified ankle and joints of unspecified foot: Secondary | ICD-10-CM | POA: Diagnosis not present

## 2017-06-25 ENCOUNTER — Other Ambulatory Visit: Payer: Self-pay | Admitting: Endocrinology

## 2017-06-25 DIAGNOSIS — E1140 Type 2 diabetes mellitus with diabetic neuropathy, unspecified: Secondary | ICD-10-CM

## 2017-07-09 DIAGNOSIS — H5703 Miosis: Secondary | ICD-10-CM | POA: Diagnosis not present

## 2017-07-09 DIAGNOSIS — H10413 Chronic giant papillary conjunctivitis, bilateral: Secondary | ICD-10-CM | POA: Diagnosis not present

## 2017-07-09 DIAGNOSIS — H25813 Combined forms of age-related cataract, bilateral: Secondary | ICD-10-CM | POA: Diagnosis not present

## 2017-07-09 DIAGNOSIS — E113493 Type 2 diabetes mellitus with severe nonproliferative diabetic retinopathy without macular edema, bilateral: Secondary | ICD-10-CM | POA: Diagnosis not present

## 2017-07-14 ENCOUNTER — Encounter: Payer: Self-pay | Admitting: Emergency Medicine

## 2017-07-14 ENCOUNTER — Other Ambulatory Visit: Payer: Self-pay

## 2017-07-14 ENCOUNTER — Ambulatory Visit: Payer: BLUE CROSS/BLUE SHIELD | Admitting: Emergency Medicine

## 2017-07-14 ENCOUNTER — Ambulatory Visit: Payer: Self-pay

## 2017-07-14 VITALS — BP 108/62 | HR 97 | Temp 98.6°F | Resp 16 | Ht 69.0 in | Wt 228.2 lb

## 2017-07-14 DIAGNOSIS — T24232A Burn of second degree of left lower leg, initial encounter: Secondary | ICD-10-CM | POA: Insufficient documentation

## 2017-07-14 DIAGNOSIS — Z23 Encounter for immunization: Secondary | ICD-10-CM

## 2017-07-14 DIAGNOSIS — Z1211 Encounter for screening for malignant neoplasm of colon: Secondary | ICD-10-CM

## 2017-07-14 DIAGNOSIS — E1140 Type 2 diabetes mellitus with diabetic neuropathy, unspecified: Secondary | ICD-10-CM | POA: Diagnosis not present

## 2017-07-14 MED ORDER — SILVER SULFADIAZINE 1 % EX CREA
TOPICAL_CREAM | Freq: Every day | CUTANEOUS | Status: DC
  Administered 2017-07-14: 14:00:00 via TOPICAL

## 2017-07-14 MED ORDER — CEFADROXIL 500 MG PO CAPS: 500.0000 mg | ORAL_CAPSULE | Freq: Two times a day (BID) | ORAL | 0 refills | Status: AC

## 2017-07-14 MED ORDER — SILVER SULFADIAZINE 1 % EX CREA: 1.0000 "application " | TOPICAL_CREAM | Freq: Every day | CUTANEOUS | 0 refills | Status: AC

## 2017-07-14 NOTE — Patient Instructions (Addendum)
IF you received an x-ray today, you will receive an invoice from [facility]. Please contact [facility] at [phone] with questions or concerns regarding your invoice.   IF you received labwork today, you will receive an invoice from [facility]. Please contact [facility] at [phone] with questions or concerns regarding your invoice.   Our billing staff will not be able to assist you with questions regarding bills from these companies.  You will be contacted with the lab results as soon as they are available. The fastest way to get your results is to activate your My Chart account. Instructions are located on the last page of this paperwork. If you have not heard from [place] regarding the results in 2 weeks, please contact this office.     Burn Care, Adult A burn is an injury to the skin or the tissues under the skin. There are three types of burns:  First degree. These burns may cause the skin to be red and a bit swollen.  Second degree. These burns are very painful and cause the skin to be very red. The skin may also leak fluid, look shiny, and start to have blisters.  Third degree. These burns cause permanent damage. They turn the skin white or black and make it look charred, dry, and leathery.  Taking care of your burn properly can help to prevent pain and infection. It can also help the burn to heal more quickly. How is this treated? Right after a burn:  Rinse or soak the burn under cool water. Do this for several minutes. Do not put ice on your burn. That can cause more damage.  Lightly cover the burn with a clean (sterile) cloth (dressing). Burn care  Raise (elevate) the injured area above the level of your heart while sitting or lying down.  Follow instructions from your doctor about: ? How to clean and take care of the burn. ? When to change and remove the cloth.  Check your burn every day for signs of infection. Check for: ? More redness,  swelling, or pain. ? Warmth. ? Pus or a bad smell. Medicine   Take over-the-counter and prescription medicines only as told by your doctor.  If you were prescribed antibiotic medicine, take or apply it as told by your doctor. Do not stop using the antibiotic even if your condition improves. General instructions  To prevent infection: ? Do not put butter, oil, or other home treatments on the burn. ? Do not scratch or pick at the burn. ? Do not break any blisters. ? Do not peel skin.  Do not rub your burn, even when you are cleaning it.  Protect your burn from the sun. Contact a doctor if:  Your condition does not get better.  Your condition gets worse.  You have a fever.  Your burn looks different or starts to have black or red spots on it.  Your burn feels warm to the touch.  Your pain is not controlled with medicine. Get help right away if:  You have redness, swelling, or pain at the site of the burn.  You have fluid, blood, or pus coming from your burn.  You have red streaks near the burn.  You have very bad pain. This information is not intended to replace advice given to you by your health care provider. Make sure you discuss any questions you have with your health care provider. Document Released: 11/27/2007 Document Revised: 04/05/2016 Document Reviewed: 08/07/2015 Elsevier Interactive  Patient Education  [employer].

## 2017-07-14 NOTE — Progress Notes (Signed)
[name]. 67 y.o.   Chief Complaint  Patient presents with  . Burn    LEFT LEG X 4 DAYS    HISTORY OF PRESENT ILLNESS: This is a 67 y.o. male diabetic, sustained burn to his left lower leg 4 days ago.  May be infected.  Painful.  HPI   Prior to Admission medications   Medication Sig Start Date End Date Taking? Authorizing Provider  aspirin 81 MG tablet Take 81 mg by mouth daily.   Yes [provider]  azelastine (ASTELIN) 0.1 % nasal spray PLACE 2 SPRAYS INTO BOTH NOSTRILS 2 (TWO) TIMES DAILY. USE IN EACH NOSTRIL AS DIRECTED 05/03/16  Yes Tereasa Coop, PA-C  canagliflozin (INVOKANA) 300 MG TABS tablet TAKE 1 TABLET (300 MG TOTAL) BY MOUTH DAILY BEFORE BREAKFAST. 04/03/17  Yes Tereasa Coop, PA-C  gabapentin (NEURONTIN) 300 MG capsule Take 3 capsules (900 mg total) by mouth 3 (three) times daily. Patient taking differently: Take 900 mg by mouth 3 (three) times daily.  04/03/17  Yes Tereasa Coop, PA-C  glimepiride (AMARYL) 4 MG tablet TAKE 1 TABLET (4 MG TOTAL) BY MOUTH 2 (TWO) TIMES DAILY BEFORE A MEAL. 08/04/15  Yes Darlyne Russian, MD  metFORMIN (GLUCOPHAGE) 1000 MG tablet TAKE 1 TABLET BY MOUTH TWICE A DAY 06/25/17  Yes Elayne Snare, MD  rosuvastatin (CRESTOR) 5 MG tablet Take 1 tablet (5 mg total) by mouth daily. 04/03/17  Yes Tereasa Coop, PA-C  traMADol (ULTRAM) 50 MG tablet Take 1 tablet (50 mg total) by mouth at bedtime. 04/03/17  Yes Tereasa Coop, PA-C  vitamin B-12 (CYANOCOBALAMIN) 1000 MCG tablet Take 1,000 mcg by mouth daily.   Yes [provider]  BAYER CONTOUR NEXT TEST test strip CHECK BLOOD SUGAR TWICE DAILY 06/03/16   Elayne Snare, MD  BAYER MICROLET LANCETS lancets Check blood sugar two times daily. 08/27/15   Elayne Snare, MD  Blood Glucose Monitoring Suppl (BAYER CONTOUR NEXT MONITOR) w/Device KIT Check blood sugar two times daily. 08/27/15   Elayne Snare, MD  cetirizine (ZYRTEC) 10 MG tablet Take 1 tablet (10 mg total) by mouth daily.  02/16/16   Ivar Drape D, PA  FLUARIX QUADRIVALENT 0.5 ML injection TO BE ADMINISTERED BY PHARMACIST FOR IMMUNIZATION 11/16/15   [provider]  fluvastatin XL (LESCOL XL) 80 MG 24 hr tablet Take 1 tablet (80 mg total) by mouth daily. Patient not taking: Reported on 07/14/2017 10/06/16   Elayne Snare, MD  omeprazole (PRILOSEC) 20 MG capsule TAKE 1 CAPSULE (20 MG TOTAL) BY MOUTH DAILY. OV NEEDED FOR ADDITIONAL REFILLS 04/30/17   Tereasa Coop, PA-C    Allergies  Allergen Reactions  . Cymbalta [Duloxetine Hcl] Nausea And Vomiting  . Darvocet [Propoxyphene N-Acetaminophen] Hives    Patient Active Problem List   Diagnosis Date Noted  . Need for hepatitis C screening test 04/03/2017  . Alcohol abuse 12/18/2014  . DOE (dyspnea on exertion) 07/18/2014  . Bifascicular block 07/18/2014  . OSA (obstructive sleep apnea) 05/21/2014  . Neuropathy 05/05/2014  . DM (diabetes mellitus) ([place]) 04/27/2012    Past Medical History:  Diagnosis Date  . Cancer [facility])    Prostate cancer age 62; Lupron treatments followed by prostatectomy.  . Diabetes mellitus without complication ([place])   . GERD (gastroesophageal reflux disease)   . Hyperlipidemia   . Hypertension   . Myocardial infarction [facility])    age 41; no stenting.  . OSA (obstructive sleep apnea)  non-compliant with CPAP.    Past Surgical History:  Procedure Laterality Date  . CHOLECYSTECTOMY    . PROSTATE SURGERY      Social History   Socioeconomic History  . Marital status: Single    Spouse name: GF-Betty  . Number of children: 2  . Years of education: 62  . Highest education level: Not on file  Occupational History  . Occupation: Buyer, retail: [employer]  . Financial resource strain: Not on file  . Food insecurity:    Worry: Not on file    Inability: Not on file  . Transportation needs:    Medical: Not on file    Non-medical: Not on file  Tobacco Use  . Smoking status:  Never Smoker  . Smokeless tobacco: Current User    Types: Chew  Substance and Sexual Activity  . Alcohol use: Yes    Alcohol/week: 0.0 oz    Comment: social  . Drug use: No  . Sexual activity: Not Currently  Lifestyle  . Physical activity:    Days per week: Not on file    Minutes per session: Not on file  . Stress: Not on file  Relationships  . Social connections:    Talks on phone: Not on file    Gets together: Not on file    Attends religious service: Not on file    Active member of club or organization: Not on file    Attends meetings of clubs or organizations: Not on file    Relationship status: Not on file  . Intimate partner violence:    Fear of current or ex partner: Not on file    Emotionally abused: Not on file    Physically abused: Not on file    Forced sexual activity: Not on file  Other Topics Concern  . Not on file  Social History Narrative   Marital status: divorced, girlfriend x 1977     Children: 1      Tobacco: none; chew tobacco      Alcohol: beers on weekends      Exercise: none    Family History  Problem Relation Age of Onset  . Cancer Mother        leukemia  . Diabetes Mother   . Cancer Father 90       lung cancer with mets  . Heart disease Maternal Grandmother   . Cancer Maternal Grandfather   . Heart disease Paternal Grandmother   . Cancer Paternal Grandfather      Review of Systems  Constitutional: Negative.  Negative for chills and fever.  HENT: Negative for sore throat.   Respiratory: Negative for shortness of breath.   Cardiovascular: Negative for chest pain.  Gastrointestinal: Negative for nausea and vomiting.  Skin:       Second-degree burn  Neurological: Negative for dizziness and headaches.  Endo/Heme/Allergies: Negative.   All other systems reviewed and are negative.   Vitals:   07/14/17 1404  BP: 108/62  Pulse: 97  Resp: 16  Temp: 98.6 F (37 C)  SpO2: 97%    Physical Exam  Constitutional: He is oriented to  person, place, and time. He appears well-developed and well-nourished.  HENT:  Head: Normocephalic and atraumatic.  Eyes: Pupils are equal, round, and reactive to light. EOM are normal.  Neck: Normal range of motion.  Cardiovascular: Normal rate.  Pulmonary/Chest: Effort normal.  Musculoskeletal: Normal range of motion.  Neurological: He is alert and  oriented to person, place, and time.  Skin: Capillary refill takes less than 2 seconds.  Second-degree burn left lower leg.  See picture below.  Psychiatric: He has a normal mood and affect. His behavior is normal.  Vitals reviewed.  .    ASSESSMENT & PLAN: Rosa was seen today for burn.  Diagnoses and all orders for this visit:  Partial thickness burn of left lower leg, initial encounter -     Tdap vaccine greater than or equal to 7yo IM -     silver sulfADIAZINE (SILVADENE) 1 % cream -     silver sulfADIAZINE (SILVADENE) 1 % cream; Apply 1 application topically daily for 7 days. -     cefadroxil (DURICEF) 500 MG capsule; Take 1 capsule (500 mg total) by mouth 2 (two) times daily for 7 days.  Type 2 diabetes mellitus with diabetic neuropathy, without long-term current use of insulin ([place])  Colon cancer screening -     Ambulatory referral to Gastroenterology   Follow-up next week.   Agustina Caroli, MD [facility]

## 2017-07-14 NOTE — Telephone Encounter (Signed)
Pt. Reports he burned his left lower leg on a hot saw blade 4 days ago. States it had a blister, but that has popped. Has been putting antibiotic spray on it, per pharmacist. States he is concerned about it because he is diabetic. Appointment made for today. Reason for Disposition . [1] Broken (ruptured) blister AND [2] caller doesn't want to trim the dead skin  Answer Assessment - Initial Assessment Questions 1. ONSET: "When did it happen?" If happened < 10 minutes ago, ask: "Did you apply cold water?" If not, give First Aid Advice immediately.      Happened about 4 days ago 2. LOCATION: "Where is the burn located?"      Left lower leg - outer aspesct 3. BURN SIZE: "How large is the burn?"  The palm is roughly 1% of the total body surface area (BSA).     3" LONG 1 1/2" WIDE 4. SEVERITY OF THE BURN: "Are there any blisters?"      Blistered then popped 5. MECHANISM: "Tell me how it happened."     Burned on a hot saw blade 6. PAIN: "Are you having any pain?" "How bad is the pain?" (Scale 1-10; or mild, moderate, severe)   - MILD (1-3): doesn't interfere with normal activities    - MODERATE (4-7): interferes with normal activities or awakens from sleep    - SEVERE (8-10): excruciating pain, unable to do any normal activities      3-4 7. INHALATION INJURY: "Were you exposed to any smoke or fumes?" If yes: "Do you have any cough or difficulty breathing?"     No 8. OTHER SYMPTOMS: "Do you have any other symptoms?" (e.g., headache, nausea)     No 9. PREGNANCY: "Is there any chance you are pregnant?" "When was your last menstrual period?"     No  Protocols used: BURNS - [facility]

## 2017-07-24 ENCOUNTER — Encounter: Payer: Self-pay | Admitting: Emergency Medicine

## 2017-07-24 ENCOUNTER — Ambulatory Visit: Payer: BLUE CROSS/BLUE SHIELD | Admitting: Emergency Medicine

## 2017-07-24 ENCOUNTER — Other Ambulatory Visit: Payer: Self-pay

## 2017-07-24 VITALS — BP 167/89 | HR 71 | Temp 98.1°F | Resp 18 | Ht 69.88 in | Wt 223.8 lb

## 2017-07-24 DIAGNOSIS — L089 Local infection of the skin and subcutaneous tissue, unspecified: Secondary | ICD-10-CM | POA: Diagnosis not present

## 2017-07-24 DIAGNOSIS — W57XXXA Bitten or stung by nonvenomous insect and other nonvenomous arthropods, initial encounter: Secondary | ICD-10-CM | POA: Diagnosis not present

## 2017-07-24 DIAGNOSIS — T24232D Burn of second degree of left lower leg, subsequent encounter: Secondary | ICD-10-CM | POA: Diagnosis not present

## 2017-07-24 MED ORDER — DOXYCYCLINE HYCLATE 100 MG PO TABS
100.0000 mg | ORAL_TABLET | Freq: Two times a day (BID) | ORAL | 0 refills | Status: DC
Start: 2017-07-24 — End: 2018-03-16

## 2017-07-24 NOTE — Progress Notes (Signed)
[name] 67 y.o.   Chief Complaint  Patient presents with  . Burn    f/u X 1 week- lower left leg    HISTORY OF PRESENT ILLNESS: This is a 67 y.o. male here for follow-up of a burn to his left lower leg.  Healing well.  No complaints. Also complaining of a tick bite to left upper inner thigh.  May have been a touch 2 to 3 days.  Also noticed redness and swelling.  HPI   Prior to Admission medications   Medication Sig Start Date End Date Taking? Authorizing Provider  aspirin 81 MG tablet Take 81 mg by mouth daily.   Yes [provider]  azelastine (ASTELIN) 0.1 % nasal spray PLACE 2 SPRAYS INTO BOTH NOSTRILS 2 (TWO) TIMES DAILY. USE IN EACH NOSTRIL AS DIRECTED 05/03/16  Yes Tereasa Coop, PA-C  BAYER CONTOUR NEXT TEST test strip CHECK BLOOD SUGAR TWICE DAILY 06/03/16  Yes Elayne Snare, MD  BAYER MICROLET LANCETS lancets Check blood sugar two times daily. 08/27/15  Yes Elayne Snare, MD  Blood Glucose Monitoring Suppl (BAYER CONTOUR NEXT MONITOR) w/Device KIT Check blood sugar two times daily. 08/27/15  Yes Elayne Snare, MD  canagliflozin (INVOKANA) 300 MG TABS tablet TAKE 1 TABLET (300 MG TOTAL) BY MOUTH DAILY BEFORE BREAKFAST. 04/03/17  Yes Tereasa Coop, PA-C  cetirizine (ZYRTEC) 10 MG tablet Take 1 tablet (10 mg total) by mouth daily. 02/16/16  Yes English, Stephanie D, PA  FLUARIX QUADRIVALENT 0.5 ML injection TO BE ADMINISTERED BY PHARMACIST FOR IMMUNIZATION 11/16/15  Yes [provider]  glimepiride (AMARYL) 4 MG tablet TAKE 1 TABLET (4 MG TOTAL) BY MOUTH 2 (TWO) TIMES DAILY BEFORE A MEAL. 08/04/15  Yes Darlyne Russian, MD  metFORMIN (GLUCOPHAGE) 1000 MG tablet TAKE 1 TABLET BY MOUTH TWICE A DAY 06/25/17  Yes Elayne Snare, MD  omeprazole (PRILOSEC) 20 MG capsule TAKE 1 CAPSULE (20 MG TOTAL) BY MOUTH DAILY. OV NEEDED FOR ADDITIONAL REFILLS 04/30/17  Yes Tereasa Coop, PA-C  rosuvastatin (CRESTOR) 5 MG tablet Take 1 tablet (5 mg total) by mouth daily. 04/03/17  Yes  Tereasa Coop, PA-C  traMADol (ULTRAM) 50 MG tablet Take 1 tablet (50 mg total) by mouth at bedtime. 04/03/17  Yes Tereasa Coop, PA-C  vitamin B-12 (CYANOCOBALAMIN) 1000 MCG tablet Take 1,000 mcg by mouth daily.   Yes [provider]  fluvastatin XL (LESCOL XL) 80 MG 24 hr tablet Take 1 tablet (80 mg total) by mouth daily. Patient not taking: Reported on 07/14/2017 10/06/16   Elayne Snare, MD  gabapentin (NEURONTIN) 300 MG capsule Take 3 capsules (900 mg total) by mouth 3 (three) times daily. Patient not taking: Reported on 07/24/2017 04/03/17   Tereasa Coop, PA-C    Allergies  Allergen Reactions  . Cymbalta [Duloxetine Hcl] Nausea And Vomiting  . Darvocet [Propoxyphene N-Acetaminophen] Hives    Patient Active Problem List   Diagnosis Date Noted  . Second degree burn of left lower leg 07/14/2017  . Need for hepatitis C screening test 04/03/2017  . Alcohol abuse 12/18/2014  . DOE (dyspnea on exertion) 07/18/2014  . Bifascicular block 07/18/2014  . OSA (obstructive sleep apnea) 05/21/2014  . Neuropathy 05/05/2014  . DM (diabetes mellitus) ([place]) 04/27/2012    Past Medical History:  Diagnosis Date  . Cancer [facility])    Prostate cancer age 81; Lupron treatments followed by prostatectomy.  . Diabetes mellitus without complication ([place])   . GERD (gastroesophageal  reflux disease)   . Hyperlipidemia   . Hypertension   . Myocardial infarction [facility])    age 42; no stenting.  . OSA (obstructive sleep apnea)    non-compliant with CPAP.    Past Surgical History:  Procedure Laterality Date  . CHOLECYSTECTOMY    . PROSTATE SURGERY      Social History   Socioeconomic History  . Marital status: Single    Spouse name: GF-Betty  . Number of children: 2  . Years of education: 38  . Highest education level: Not on file  Occupational History  . Occupation: Buyer, retail: [employer]  . Financial resource strain: Not on file  . Food  insecurity:    Worry: Not on file    Inability: Not on file  . Transportation needs:    Medical: Not on file    Non-medical: Not on file  Tobacco Use  . Smoking status: Never Smoker  . Smokeless tobacco: Current User    Types: Chew  Substance and Sexual Activity  . Alcohol use: Yes    Alcohol/week: 0.0 oz    Comment: social  . Drug use: No  . Sexual activity: Not Currently  Lifestyle  . Physical activity:    Days per week: Not on file    Minutes per session: Not on file  . Stress: Not on file  Relationships  . Social connections:    Talks on phone: Not on file    Gets together: Not on file    Attends religious service: Not on file    Active member of club or organization: Not on file    Attends meetings of clubs or organizations: Not on file    Relationship status: Not on file  . Intimate partner violence:    Fear of current or ex partner: Not on file    Emotionally abused: Not on file    Physically abused: Not on file    Forced sexual activity: Not on file  Other Topics Concern  . Not on file  Social History Narrative   Marital status: divorced, girlfriend x 1977     Children: 1      Tobacco: none; chew tobacco      Alcohol: beers on weekends      Exercise: none    Family History  Problem Relation Age of Onset  . Cancer Mother        leukemia  . Diabetes Mother   . Cancer Father 6       lung cancer with mets  . Heart disease Maternal Grandmother   . Cancer Maternal Grandfather   . Heart disease Paternal Grandmother   . Cancer Paternal Grandfather      Review of Systems  Constitutional: Negative.  Negative for chills and fever.  HENT: Negative for sore throat.   Respiratory: Negative for cough and shortness of breath.   Cardiovascular: Negative for chest pain and palpitations.  Gastrointestinal: Negative for abdominal pain, diarrhea, nausea and vomiting.  Skin: Negative for rash.  Neurological: Negative for dizziness and headaches.    Endo/Heme/Allergies: Negative.   All other systems reviewed and are negative.   Vitals:   07/24/17 0921 07/24/17 0931  BP: (!) 152/78 (!) 167/89  Pulse: 71   Resp: 18   Temp: 98.1 F (36.7 C)   SpO2: 99%     Physical Exam  Constitutional: He is oriented to person, place, and time. He appears well-developed and well-nourished.  HENT:  Head: Normocephalic and atraumatic.  Eyes: Pupils are equal, round, and reactive to light. EOM are normal.  Neck: Normal range of motion.  Cardiovascular: Normal rate and regular rhythm.  Pulmonary/Chest: Effort normal and breath sounds normal.  Musculoskeletal: Normal range of motion.  Neurological: He is alert and oriented to person, place, and time. No sensory deficit. He exhibits normal muscle tone.  Skin: Skin is warm and dry. Capillary refill takes less than 2 seconds. No rash noted.  Psychiatric: He has a normal mood and affect. His behavior is normal.  Vitals reviewed.  .      ASSESSMENT & PLAN: [name] was seen today for burn and tick removal.  Diagnoses and all orders for this visit:  Partial thickness burn of left lower leg, subsequent encounter Comments: improved  Tick bite, initial encounter -     doxycycline (VIBRA-TABS) 100 MG tablet; Take 1 tablet (100 mg total) by mouth 2 (two) times daily.  Skin infection -     doxycycline (VIBRA-TABS) 100 MG tablet; Take 1 tablet (100 mg total) by mouth 2 (two) times daily.    Patient Instructions       IF you received an x-ray today, you will receive an invoice from [facility]. Please contact [facility] at [phone] with questions or concerns regarding your invoice.   IF you received labwork today, you will receive an invoice from [facility]. Please contact [facility] at [phone] with questions or concerns regarding your invoice.   Our billing staff will not be able to assist you with questions regarding bills from these companies.  You will be  contacted with the lab results as soon as they are available. The fastest way to get your results is to activate your My Chart account. Instructions are located on the last page of this paperwork. If you have not heard from [place] regarding the results in 2 weeks, please contact this office.     Burn Care, Adult A burn is an injury to the skin or the tissues under the skin. There are three types of burns:  First degree. These burns may cause the skin to be red and a bit swollen.  Second degree. These burns are very painful and cause the skin to be very red. The skin may also leak fluid, look shiny, and start to have blisters.  Third degree. These burns cause permanent damage. They turn the skin white or black and make it look charred, dry, and leathery.  Taking care of your burn properly can help to prevent pain and infection. It can also help the burn to heal more quickly. How is this treated? Right after a burn:  Rinse or soak the burn under cool water. Do this for several minutes. Do not put ice on your burn. That can cause more damage.  Lightly cover the burn with a clean (sterile) cloth (dressing). Burn care  Raise (elevate) the injured area above the level of your heart while sitting or lying down.  Follow instructions from your doctor about: ? How to clean and take care of the burn. ? When to change and remove the cloth.  Check your burn every day for signs of infection. Check for: ? More redness, swelling, or pain. ? Warmth. ? Pus or a bad smell. Medicine   Take over-the-counter and prescription medicines only as told by your doctor.  If you were prescribed antibiotic medicine, take or apply it as told by your doctor. Do not stop using the antibiotic even if  your condition improves. General instructions  To prevent infection: ? Do not put butter, oil, or other home treatments on the burn. ? Do not scratch or pick at the burn. ? Do not break any blisters. ? Do not peel  skin.  Do not rub your burn, even when you are cleaning it.  Protect your burn from the sun. Contact a doctor if:  Your condition does not get better.  Your condition gets worse.  You have a fever.  Your burn looks different or starts to have black or red spots on it.  Your burn feels warm to the touch.  Your pain is not controlled with medicine. Get help right away if:  You have redness, swelling, or pain at the site of the burn.  You have fluid, blood, or pus coming from your burn.  You have red streaks near the burn.  You have very bad pain. This information is not intended to replace advice given to you by your health care provider. Make sure you discuss any questions you have with your health care provider. Document Released: 11/27/2007 Document Revised: 04/05/2016 Document Reviewed: 08/07/2015 Elsevier Interactive Patient Education  2018 [place], Adult Ticks are insects that can bite. Most ticks live in shrubs and grassy areas. They climb onto people and animals that go by. Then they bite. Some ticks carry germs that can make you sick. How can I prevent tick bites?  Use an insect repellent that has 20% or higher of the ingredients DEET, picaridin, or IR3535. Put this insect repellent on: ? Bare skin. ? The tops of your boots. ? Your pant legs. ? The ends of your sleeves.  If you use an insect repellent that has the ingredient permethrin, make sure to follow the instructions on the bottle. Treat the following: ? Clothing. ? Supplies. ? Boots. ? Tents.  Wear long sleeves, long pants, and light colors.  Tuck your pant legs into your socks.  Stay in the middle of the trail.  Try not to walk through long grass.  Before going inside your house, check your clothes, hair, and skin for ticks. Make sure to check your head, neck, armpits, waist, groin, and joint areas.  Check for ticks every day.  When you come indoors: ? Wash your  clothes right away. ? Shower right away. ? Dry your clothes in a dryer on high heat for 60 minutes or more. What is the right way to remove a tick? Remove a tick from your skin as soon as possible.  To remove a tick that is crawling on your skin: ? Go outdoors and brush the tick off. ? Use tape or a lint roller.  To remove a tick that is biting: ? Wash your hands. ? If you have latex gloves, put them on. ? Use tweezers, curved forceps, or a tick-removal tool to grasp the tick. Grasp the tick as close to your skin and as close to the tick's head as possible. ? Gently pull up until the tick lets go.  Try to keep the tick's head attached to its body.  Do not twist or jerk the tick.  Do not squeeze or crush the tick.  Do not try to remove a tick with heat, alcohol, petroleum jelly, or fingernail polish. How should I get rid of a tick? Here are some ways to get rid of a tick that is alive:  Place the tick in rubbing alcohol.  Place the tick in  a bag or container you can close tightly.  Wrap the tick tightly in tape.  Flush the tick down the toilet.  Contact a doctor if:  You have symptoms of a disease, such as: ? Pain in a muscle, joint, or bone. ? Trouble walking or moving your legs. ? Numbness in your legs. ? Inability to move (paralysis). ? A red rash that makes a circle (bull's-eye rash). ? Redness and swelling where the tick bit you. ? A fever. ? Throwing up (vomiting) over and over. ? Diarrhea. ? Weight loss. ? Tender and swollen lymph glands. ? Shortness of breath. ? Cough. ? Belly pain (abdominal pain). ? Headache. ? Being more tired than normal. ? A change in how alert (conscious) you are. ? Confusion. Get help right away if:  You cannot remove a tick.  A part of a tick breaks off and gets stuck in your skin.  You are feeling worse. Summary  Ticks may carry germs that can make you sick.  To prevent tick bites, wear long sleeves, long pants, and  light colors. Use insect repellent. Follow the instructions on the bottle.  If the tick is biting, do not try to remove it with heat, alcohol, petroleum jelly, or fingernail polish.  Use tweezers, curved forceps, or a tick-removal tool to grasp the tick. Gently pull up until the tick lets go. Do not twist or jerk the tick. Do not squeeze or crush the tick.  If you have symptoms, contact a doctor. This information is not intended to replace advice given to you by your health care provider. Make sure you discuss any questions you have with your health care provider. Document Released: 05/14/2009 Document Revised: 05/30/2016 Document Reviewed: 05/30/2016 Elsevier Interactive Patient Education  2018 Elsevier Inc.     Agustina Caroli, MD [facility]

## 2017-07-24 NOTE — Patient Instructions (Addendum)
IF you received an x-ray today, you will receive an invoice from [facility]. Please contact [facility] at [phone] with questions or concerns regarding your invoice.   IF you received labwork today, you will receive an invoice from [facility]. Please contact [facility] at [phone] with questions or concerns regarding your invoice.   Our billing staff will not be able to assist you with questions regarding bills from these companies.  You will be contacted with the lab results as soon as they are available. The fastest way to get your results is to activate your My Chart account. Instructions are located on the last page of this paperwork. If you have not heard from [place] regarding the results in 2 weeks, please contact this office.     Burn Care, Adult A burn is an injury to the skin or the tissues under the skin. There are three types of burns:  First degree. These burns may cause the skin to be red and a bit swollen.  Second degree. These burns are very painful and cause the skin to be very red. The skin may also leak fluid, look shiny, and start to have blisters.  Third degree. These burns cause permanent damage. They turn the skin white or black and make it look charred, dry, and leathery.  Taking care of your burn properly can help to prevent pain and infection. It can also help the burn to heal more quickly. How is this treated? Right after a burn:  Rinse or soak the burn under cool water. Do this for several minutes. Do not put ice on your burn. That can cause more damage.  Lightly cover the burn with a clean (sterile) cloth (dressing). Burn care  Raise (elevate) the injured area above the level of your heart while sitting or lying down.  Follow instructions from your doctor about: ? How to clean and take care of the burn. ? When to change and remove the cloth.  Check your burn every day for signs of infection. Check for: ? More redness,  swelling, or pain. ? Warmth. ? Pus or a bad smell. Medicine   Take over-the-counter and prescription medicines only as told by your doctor.  If you were prescribed antibiotic medicine, take or apply it as told by your doctor. Do not stop using the antibiotic even if your condition improves. General instructions  To prevent infection: ? Do not put butter, oil, or other home treatments on the burn. ? Do not scratch or pick at the burn. ? Do not break any blisters. ? Do not peel skin.  Do not rub your burn, even when you are cleaning it.  Protect your burn from the sun. Contact a doctor if:  Your condition does not get better.  Your condition gets worse.  You have a fever.  Your burn looks different or starts to have black or red spots on it.  Your burn feels warm to the touch.  Your pain is not controlled with medicine. Get help right away if:  You have redness, swelling, or pain at the site of the burn.  You have fluid, blood, or pus coming from your burn.  You have red streaks near the burn.  You have very bad pain. This information is not intended to replace advice given to you by your health care provider. Make sure you discuss any questions you have with your health care provider. Document Released: 11/27/2007 Document Revised: 04/05/2016 Document Reviewed: 08/07/2015 Elsevier Interactive  Patient Education  2018 [place], Adult Ticks are insects that can bite. Most ticks live in shrubs and grassy areas. They climb onto people and animals that go by. Then they bite. Some ticks carry germs that can make you sick. How can I prevent tick bites?  Use an insect repellent that has 20% or higher of the ingredients DEET, picaridin, or IR3535. Put this insect repellent on: ? Bare skin. ? The tops of your boots. ? Your pant legs. ? The ends of your sleeves.  If you use an insect repellent that has the ingredient permethrin, make sure to  follow the instructions on the bottle. Treat the following: ? Clothing. ? Supplies. ? Boots. ? Tents.  Wear long sleeves, long pants, and light colors.  Tuck your pant legs into your socks.  Stay in the middle of the trail.  Try not to walk through long grass.  Before going inside your house, check your clothes, hair, and skin for ticks. Make sure to check your head, neck, armpits, waist, groin, and joint areas.  Check for ticks every day.  When you come indoors: ? Wash your clothes right away. ? Shower right away. ? Dry your clothes in a dryer on high heat for 60 minutes or more. What is the right way to remove a tick? Remove a tick from your skin as soon as possible.  To remove a tick that is crawling on your skin: ? Go outdoors and brush the tick off. ? Use tape or a lint roller.  To remove a tick that is biting: ? Wash your hands. ? If you have latex gloves, put them on. ? Use tweezers, curved forceps, or a tick-removal tool to grasp the tick. Grasp the tick as close to your skin and as close to the tick's head as possible. ? Gently pull up until the tick lets go.  Try to keep the tick's head attached to its body.  Do not twist or jerk the tick.  Do not squeeze or crush the tick.  Do not try to remove a tick with heat, alcohol, petroleum jelly, or fingernail polish. How should I get rid of a tick? Here are some ways to get rid of a tick that is alive:  Place the tick in rubbing alcohol.  Place the tick in a bag or container you can close tightly.  Wrap the tick tightly in tape.  Flush the tick down the toilet.  Contact a doctor if:  You have symptoms of a disease, such as: ? Pain in a muscle, joint, or bone. ? Trouble walking or moving your legs. ? Numbness in your legs. ? Inability to move (paralysis). ? A red rash that makes a circle (bull's-eye rash). ? Redness and swelling where the tick bit you. ? A fever. ? Throwing up (vomiting) over and  over. ? Diarrhea. ? Weight loss. ? Tender and swollen lymph glands. ? Shortness of breath. ? Cough. ? Belly pain (abdominal pain). ? Headache. ? Being more tired than normal. ? A change in how alert (conscious) you are. ? Confusion. Get help right away if:  You cannot remove a tick.  A part of a tick breaks off and gets stuck in your skin.  You are feeling worse. Summary  Ticks may carry germs that can make you sick.  To prevent tick bites, wear long sleeves, long pants, and light colors. Use insect repellent. Follow the instructions on the bottle.  If  the tick is biting, do not try to remove it with heat, alcohol, petroleum jelly, or fingernail polish.  Use tweezers, curved forceps, or a tick-removal tool to grasp the tick. Gently pull up until the tick lets go. Do not twist or jerk the tick. Do not squeeze or crush the tick.  If you have symptoms, contact a doctor. This information is not intended to replace advice given to you by your health care provider. Make sure you discuss any questions you have with your health care provider. Document Released: 05/14/2009 Document Revised: 05/30/2016 Document Reviewed: 05/30/2016 Elsevier Interactive Patient Education  2018 [employer].

## 2017-07-29 ENCOUNTER — Encounter (INDEPENDENT_AMBULATORY_CARE_PROVIDER_SITE_OTHER): Payer: BLUE CROSS/BLUE SHIELD | Admitting: Ophthalmology

## 2017-08-06 ENCOUNTER — Other Ambulatory Visit: Payer: Self-pay

## 2017-08-06 ENCOUNTER — Ambulatory Visit: Payer: BLUE CROSS/BLUE SHIELD | Admitting: Emergency Medicine

## 2017-08-06 ENCOUNTER — Encounter: Payer: Self-pay | Admitting: Emergency Medicine

## 2017-08-06 VITALS — BP 142/70 | HR 103 | Temp 98.4°F | Resp 16 | Wt 227.4 lb

## 2017-08-06 DIAGNOSIS — S90862A Insect bite (nonvenomous), left foot, initial encounter: Secondary | ICD-10-CM | POA: Diagnosis not present

## 2017-08-06 DIAGNOSIS — W57XXXA Bitten or stung by nonvenomous insect and other nonvenomous arthropods, initial encounter: Secondary | ICD-10-CM

## 2017-08-06 DIAGNOSIS — L089 Local infection of the skin and subcutaneous tissue, unspecified: Secondary | ICD-10-CM

## 2017-08-06 MED ORDER — CEPHALEXIN 500 MG PO CAPS: 500.0000 mg | ORAL_CAPSULE | Freq: Three times a day (TID) | ORAL | 0 refills | Status: AC

## 2017-08-06 NOTE — Patient Instructions (Addendum)
IF you received an x-ray today, you will receive an invoice from [facility]. Please contact [facility] at [phone] with questions or concerns regarding your invoice.   IF you received labwork today, you will receive an invoice from [facility]. Please contact [facility] at [phone] with questions or concerns regarding your invoice.   Our billing staff will not be able to assist you with questions regarding bills from these companies.  You will be contacted with the lab results as soon as they are available. The fastest way to get your results is to activate your My Chart account. Instructions are located on the last page of this paperwork. If you have not heard from [place] regarding the results in 2 weeks, please contact this office.     Insect Bite, Adult An insect bite can make your skin red, itchy, and swollen. Some insects can spread disease to people with a bite. However, most insect bites do not lead to disease, and most are not serious. Follow these instructions at home: Bite area care  Do not scratch the bite area.  Keep the bite area clean and dry.  Wash the bite area every day with soap and water as told by your doctor.  Check the bite area every day for signs of infection. Check for: ? More redness, swelling, or pain. ? Fluid or blood. ? Warmth. ? Pus. Managing pain, itching, and swelling  You may put any of these on the bite area as told by your doctor: ? A baking soda paste. ? Cortisone cream. ? Calamine lotion.  If directed, put ice on the bite area. ? Put ice in a plastic bag. ? Place a towel between your skin and the bag. ? Leave the ice on for 20 minutes, 2-3 times a day. Medicines  Take medicines or put medicines on your skin only as told by your doctor.  If you were prescribed an antibiotic medicine, use it as told by your doctor. Do not stop using the antibiotic even if your condition improves. General instructions  Keep all  follow-up visits as told by your doctor. This is important. How is this prevented? To help you have a lower risk of insect bites:  When you are outside, wear clothing that covers your arms and legs.  Use insect repellent. The best insect repellents have: ? An active ingredient of DEET, picaridin, oil of lemon eucalyptus (OLE), or IR3535. ? Higher amounts of DEET or another active ingredient than other repellents have.  If your home windows do not have screens, think about putting some in.  Contact a doctor if:  You have more redness, swelling, or pain in the bite area.  You have fluid, blood, or pus coming from the bite area.  The bite area feels warm.  You have a fever. Get help right away if:  You have joint pain.  You have a rash.  You have shortness of breath.  You feel more tired or sleepy than you normally do.  You have neck pain.  You have a headache.  You feel weaker than you normally do.  You have chest pain.  You have pain in your belly.  You feel sick to your stomach (nauseous) or you throw up (vomit). Summary  An insect bite can make your skin red, itchy, and swollen.  Do not scratch the bite area, and keep it clean and dry.  Ice can help with pain and itching from the bite. This information is not  intended to replace advice given to you by your health care provider. Make sure you discuss any questions you have with your health care provider. Document Released: 02/15/2000 Document Revised: 09/20/2015 Document Reviewed: 07/05/2014 Elsevier Interactive Patient Education  2018 [employer].
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
## 2017-08-07 ENCOUNTER — Encounter (INDEPENDENT_AMBULATORY_CARE_PROVIDER_SITE_OTHER): Payer: BLUE CROSS/BLUE SHIELD | Admitting: Ophthalmology

## 2017-08-07 ENCOUNTER — Other Ambulatory Visit: Payer: Self-pay | Admitting: Physician Assistant

## 2017-08-07 DIAGNOSIS — E1140 Type 2 diabetes mellitus with diabetic neuropathy, unspecified: Secondary | ICD-10-CM

## 2017-08-07 NOTE — Telephone Encounter (Signed)
Interface refill request for Invokana 300mg  300mg    /// last refill 04/03/17 #60 no refills.   ///LOV  06/05/17 with Sharia Reeve   ///PCP Philis Fendt, MD  ///Phamacy  [facility]  [address] , [place].

## 2017-08-08 ENCOUNTER — Ambulatory Visit: Payer: BLUE CROSS/BLUE SHIELD | Admitting: Family Medicine

## 2017-08-08 ENCOUNTER — Encounter: Payer: Self-pay | Admitting: Family Medicine

## 2017-08-08 VITALS — BP 168/83 | HR 65 | Temp 97.9°F | Resp 16 | Ht 68.0 in | Wt 229.2 lb

## 2017-08-08 DIAGNOSIS — W57XXXD Bitten or stung by nonvenomous insect and other nonvenomous arthropods, subsequent encounter: Secondary | ICD-10-CM | POA: Diagnosis not present

## 2017-08-08 DIAGNOSIS — S90862D Insect bite (nonvenomous), left foot, subsequent encounter: Secondary | ICD-10-CM | POA: Diagnosis not present

## 2017-08-08 DIAGNOSIS — E1159 Type 2 diabetes mellitus with other circulatory complications: Secondary | ICD-10-CM | POA: Diagnosis not present

## 2017-08-08 LAB — POCT CBC
Granulocyte percent: 57.1 %G (ref 37–80)
HEMATOCRIT: 47.2 % (ref 43.5–53.7)
HEMOGLOBIN: 15.3 g/dL (ref 14.1–18.1)
Lymph, poc: 1.7 (ref 0.6–3.4)
MCH: 31.1 pg (ref 27–31.2)
MCHC: 32.3 g/dL (ref 31.8–35.4)
MCV: 96.5 fL (ref 80–97)
MID (cbc): 0.4 (ref 0–0.9)
MPV: 7.1 fL (ref 0–99.8)
POC GRANULOCYTE: 2.9 (ref 2–6.9)
POC LYMPH PERCENT: 34.4 %L (ref 10–50)
POC MID %: 8.5 % (ref 0–12)
Platelet Count, POC: 146 10*3/uL (ref 142–424)
RBC: 4.91 M/uL (ref 4.69–6.13)
RDW, POC: 11.9 %
WBC: 5 10*3/uL (ref 4.6–10.2)

## 2017-08-08 LAB — GLUCOSE, POCT (MANUAL RESULT ENTRY): POC GLUCOSE: 80 mg/dL (ref 70–99)

## 2017-08-08 NOTE — Progress Notes (Signed)
Subjective:    Patient ID: [name], male    DOB: 1950/07/27, 67 y.o.   MRN: [id number]  08/08/2017  Foot Pain (left foot, purple bruise marking )    HPI This 67 y.o. male presents for 48 hour follow-up evaluation of  L foot insect bite.  Went outside to [employer]; lots of things packed into utility building.  Took picture; had two puncture wounds at site.  Has diabetes with neuropathy.  Took picture with phone and showed two puncture wounds.  Evaluated on 08/06/17; light red and now turning dark purple.  Applying ice. Prescribed Keflex 511m tid.   No longer sore.  Concerned about colors.  Twice darker and turning dark purple.  Worried about foot.  L leg burn.  No fever/chills/sweats.  Burning into L anterior chest.  No streaking.  No drainage.  Icing it and watching it. Sugar 122 this morning. Had shoes on when suffered bite.     BP Readings from Last 3 Encounters:  08/08/17 (!) 168/83  08/06/17 (!) 142/70  07/24/17 (!) 167/89   Wt Readings from Last 3 Encounters:  08/08/17 229 lb 3.2 oz (104 kg)  08/06/17 227 lb 6.4 oz (103.1 kg)  07/24/17 223 lb 12.8 oz (101.5 kg)   Immunization History  Administered Date(s) Administered  . Influenza-Unspecified 11/16/2015  . Pneumococcal Polysaccharide-23 08/04/2015  . Tdap 09/26/2012, 07/14/2017    Review of Systems  Constitutional: Negative for activity change, appetite change, chills, diaphoresis, fatigue and fever.  Respiratory: Negative for cough and shortness of breath.   Cardiovascular: Negative for chest pain, palpitations and leg swelling.  Gastrointestinal: Negative for abdominal pain, diarrhea, nausea and vomiting.  Endocrine: Negative for cold intolerance, heat intolerance, polydipsia, polyphagia and polyuria.  Skin: Positive for color change and wound. Negative for rash.  Neurological: Positive for numbness. Negative for dizziness, tremors, seizures, syncope, facial asymmetry, speech difficulty, weakness,  light-headedness and headaches.  Psychiatric/Behavioral: Negative for dysphoric mood and sleep disturbance. The patient is not nervous/anxious.     Past Medical History:  Diagnosis Date  . Cancer ([facility]    Prostate cancer age 67 Lupron treatments followed by prostatectomy.  . [place]   . GERD (gastroesophageal reflux disease)   . Hyperlipidemia   . Hypertension   . Myocardial infarction ([facility]    age 67 no stenting.  . OSA (obstructive sleep apnea)    non-compliant with CPAP.   Past Surgical History:  Procedure Laterality Date  . CHOLECYSTECTOMY    . PROSTATE SURGERY     Allergies  Allergen Reactions  . Cymbalta [Duloxetine Hcl] Nausea And Vomiting  . Darvocet [Propoxyphene N-Acetaminophen] Hives   Current Outpatient Medications on File Prior to Visit  Medication Sig Dispense Refill  . aspirin 81 MG tablet Take 81 mg by mouth daily.    .Marland Kitchenazelastine (ASTELIN) 0.1 % nasal spray PLACE 2 SPRAYS INTO BOTH NOSTRILS 2 (TWO) TIMES DAILY. USE IN EACH NOSTRIL AS DIRECTED 30 mL 1  . BAYER CONTOUR NEXT TEST test strip CHECK BLOOD SUGAR TWICE DAILY 100 each 5  . BAYER MICROLET LANCETS lancets Check blood sugar two times daily. 100 each 5  . Blood Glucose Monitoring Suppl (BAYER CONTOUR NEXT MONITOR) w/Device KIT Check blood sugar two times daily. 1 kit 2  . cephALEXin (KEFLEX) 500 MG capsule Take 1 capsule (500 mg total) by mouth 3 (three) times daily for 7 days. 21 capsule 0  . cetirizine (ZYRTEC) 10 MG tablet Take  1 tablet (10 mg total) by mouth daily. 30 tablet 11  . doxycycline (VIBRA-TABS) 100 MG tablet Take 1 tablet (100 mg total) by mouth 2 (two) times daily. 20 tablet 0  . FLUARIX QUADRIVALENT 0.5 ML injection TO BE ADMINISTERED BY PHARMACIST FOR IMMUNIZATION  0  . fluvastatin XL (LESCOL XL) 80 MG 24 hr tablet Take 1 tablet (80 mg total) by mouth daily. 30 tablet 3  . gabapentin (NEURONTIN) 300 MG capsule Take 3 capsules (900 mg total) by mouth 3  (three) times daily. 810 capsule 3  . glimepiride (AMARYL) 4 MG tablet TAKE 1 TABLET (4 MG TOTAL) BY MOUTH 2 (TWO) TIMES DAILY BEFORE A MEAL. 180 tablet 3  . INVOKANA 300 MG TABS tablet TAKE 1 TABLET (300 MG TOTAL) BY MOUTH DAILY BEFORE BREAKFAST. 60 tablet 0  . metFORMIN (GLUCOPHAGE) 1000 MG tablet TAKE 1 TABLET BY MOUTH TWICE A DAY 60 tablet 0  . omeprazole (PRILOSEC) 20 MG capsule TAKE 1 CAPSULE (20 MG TOTAL) BY MOUTH DAILY. OV NEEDED FOR ADDITIONAL REFILLS 90 capsule 0  . rosuvastatin (CRESTOR) 5 MG tablet Take 1 tablet (5 mg total) by mouth daily. 90 tablet 3  . traMADol (ULTRAM) 50 MG tablet Take 1 tablet (50 mg total) by mouth at bedtime. 90 tablet 0  . vitamin B-12 (CYANOCOBALAMIN) 1000 MCG tablet Take 1,000 mcg by mouth daily.     Current Facility-Administered Medications on File Prior to Visit  Medication Dose Route Frequency Provider Last Rate Last Dose  . silver sulfADIAZINE (SILVADENE) 1 % cream   Topical Daily Sagardia, Ines Bloomer, MD       Social History   Socioeconomic History  . Marital status: Single    Spouse name: GF-Betty  . Number of children: 2  . Years of education: 56  . Highest education level: Not on file  Occupational History  . Occupation: Buyer, retail: [employer]  . Financial resource strain: Not on file  . Food insecurity:    Worry: Not on file    Inability: Not on file  . Transportation needs:    Medical: Not on file    Non-medical: Not on file  Tobacco Use  . Smoking status: Never Smoker  . Smokeless tobacco: Current User    Types: Chew  Substance and Sexual Activity  . Alcohol use: Yes    Alcohol/week: 0.0 oz    Comment: social  . Drug use: No  . Sexual activity: Not Currently  Lifestyle  . Physical activity:    Days per week: Not on file    Minutes per session: Not on file  . Stress: Not on file  Relationships  . Social connections:    Talks on phone: Not on file    Gets together: Not on file     Attends religious service: Not on file    Active member of club or organization: Not on file    Attends meetings of clubs or organizations: Not on file    Relationship status: Not on file  . Intimate partner violence:    Fear of current or ex partner: Not on file    Emotionally abused: Not on file    Physically abused: Not on file    Forced sexual activity: Not on file  Other Topics Concern  . Not on file  Social History Narrative   Marital status: divorced, girlfriend x 1977     Children: 1      Tobacco:  none; chew tobacco      Alcohol: beers on weekends      Exercise: none   Family History  Problem Relation Age of Onset  . Cancer Mother        leukemia  . Diabetes Mother   . Cancer Father 15       lung cancer with mets  . Heart disease Maternal Grandmother   . Cancer Maternal Grandfather   . Heart disease Paternal Grandmother   . Cancer Paternal Grandfather        Objective:    BP (!) 168/83   Pulse 65   Temp 97.9 F (36.6 C) (Oral)   Resp 16   Ht '5\' 8"'  (1.727 m)   Wt 229 lb 3.2 oz (104 kg)   SpO2 99%   BMI 34.85 kg/m  Physical Exam  Constitutional: He is oriented to person, place, and time. He appears well-developed and well-nourished. No distress.  HENT:  Head: Normocephalic and atraumatic.  Right Ear: External ear normal.  Left Ear: External ear normal.  Nose: Nose normal.  Mouth/Throat: Oropharynx is clear and moist.  Eyes: Pupils are equal, round, and reactive to light. Conjunctivae and EOM are normal.  Neck: Normal range of motion. Neck supple. Carotid bruit is not present. No thyromegaly present.  Cardiovascular: Normal rate, regular rhythm, normal heart sounds and intact distal pulses. Exam reveals no gallop and no friction rub.  No murmur heard. Pulmonary/Chest: Effort normal and breath sounds normal. He has no wheezes. He has no rales.  Musculoskeletal:       Left ankle: Normal. He exhibits normal range of motion, no swelling and no ecchymosis. No  tenderness. No lateral malleolus and no medial malleolus tenderness found.       Left foot: There is normal range of motion, no tenderness, no bony tenderness, no swelling, normal capillary refill, no crepitus, no deformity and no laceration.       Feet:  Lymphadenopathy:    He has no cervical adenopathy.  Neurological: He is alert and oriented to person, place, and time. No cranial nerve deficit.  Skin: Skin is warm and dry. No rash noted. He is not diaphoretic. No erythema. No pallor.  LEFT foot with ecchymoses present 3cm x 4 cm without erythema, warmth, fluctuance, streaking.  Non-tender to palpation.   Psychiatric: He has a normal mood and affect. His behavior is normal.  Nursing note and vitals reviewed.  No results found. Depression screen [facility] 2/9 08/08/2017 08/06/2017 07/24/2017 07/14/2017 04/03/2017  Decreased Interest 0 0 0 0 0  Down, Depressed, Hopeless 0 0 0 0 0  PHQ - 2 Score 0 0 0 0 0   Fall Risk  08/08/2017 08/06/2017 07/24/2017 07/14/2017 04/03/2017  Falls in the past year? No No No No No    Results for orders placed or performed in visit on 08/08/17  POCT CBC  Result Value Ref Range   WBC 5.0 4.6 - 10.2 K/uL   Lymph, poc 1.7 0.6 - 3.4   POC LYMPH PERCENT 34.4 10 - 50 %L   MID (cbc) 0.4 0 - 0.9   POC MID % 8.5 0 - 12 %M   POC Granulocyte 2.9 2 - 6.9   Granulocyte percent 57.1 37 - 80 %G   RBC 4.91 4.69 - 6.13 M/uL   Hemoglobin 15.3 14.1 - 18.1 g/dL   HCT, POC 47.2 43.5 - 53.7 %   MCV 96.5 80 - 97 fL   MCH, POC 31.1 27 -  31.2 pg   MCHC 32.3 31.8 - 35.4 g/dL   RDW, POC 11.9 %   Platelet Count, POC 146 142 - 424 K/uL   MPV 7.1 0 - 99.8 fL  POCT glucose (manual entry)  Result Value Ref Range   POC Glucose 80 70 - 99 mg/dl       Assessment & Plan:   1. Insect bite of left foot, subsequent encounter   2. Type 2 diabetes mellitus with other circulatory complication, without long-term current use of insulin (HCC)     Well-healing insect bite to LEFT foot.  Reassurance  provided; no evidence of abscess formation or ulceration; advised patient to complete Keflex therapy.  Keep wound clean and covered.  Labs reassuring.  Monitor sugars closely to ensure good glycemic control.  RTC for fever, pain, worsening swelling, development of redness. At this time, no evidence of two puncture wounds.  If patient has suffered a snake bite, no systemic symptoms at this time.   Orders Placed This Encounter  Procedures  . POCT CBC  . POCT glucose (manual entry)   No orders of the defined types were placed in this encounter.   No follow-ups on file.   Talma Aguillard Elayne Guerin, M.D. Primary Care at [facility] previously [facility] [address] [place],   [id number] [phone] phone [phone] fax

## 2017-08-08 NOTE — Patient Instructions (Addendum)
IF you received an x-ray today, you will receive an invoice from [facility]. Please contact [facility] at [phone] with questions or concerns regarding your invoice.   IF you received labwork today, you will receive an invoice from [place]. Please contact [facility] at [phone] with questions or concerns regarding your invoice.   Our billing staff will not be able to assist you with questions regarding bills from these companies.  You will be contacted with the lab results as soon as they are available. The fastest way to get your results is to activate your My Chart account. Instructions are located on the last page of this paperwork. If you have not heard from [place] regarding the results in 2 weeks, please contact this office.      Insect Bite, Adult An insect bite can make your skin red, itchy, and swollen. Some insects can spread disease to people with a bite. However, most insect bites do not lead to disease, and most are not serious. Follow these instructions at home: Bite area care  Do not scratch the bite area.  Keep the bite area clean and dry.  Wash the bite area every day with soap and water as told by your doctor.  Check the bite area every day for signs of infection. Check for: ? More redness, swelling, or pain. ? Fluid or blood. ? Warmth. ? Pus. Managing pain, itching, and swelling  You may put any of these on the bite area as told by your doctor: ? A baking soda paste. ? Cortisone cream. ? Calamine lotion.  If directed, put ice on the bite area. ? Put ice in a plastic bag. ? Place a towel between your skin and the bag. ? Leave the ice on for 20 minutes, 2-3 times a day. Medicines  Take medicines or put medicines on your skin only as told by your doctor.  If you were prescribed an antibiotic medicine, use it as told by your doctor. Do not stop using the antibiotic even if your condition improves. General instructions  Keep all  follow-up visits as told by your doctor. This is important. How is this prevented? To help you have a lower risk of insect bites:  When you are outside, wear clothing that covers your arms and legs.  Use insect repellent. The best insect repellents have: ? An active ingredient of DEET, picaridin, oil of lemon eucalyptus (OLE), or IR3535. ? Higher amounts of DEET or another active ingredient than other repellents have.  If your home windows do not have screens, think about putting some in.  Contact a doctor if:  You have more redness, swelling, or pain in the bite area.  You have fluid, blood, or pus coming from the bite area.  The bite area feels warm.  You have a fever. Get help right away if:  You have joint pain.  You have a rash.  You have shortness of breath.  You feel more tired or sleepy than you normally do.  You have neck pain.  You have a headache.  You feel weaker than you normally do.  You have chest pain.  You have pain in your belly.  You feel sick to your stomach (nauseous) or you throw up (vomit). Summary  An insect bite can make your skin red, itchy, and swollen.  Do not scratch the bite area, and keep it clean and dry.  Ice can help with pain and itching from the bite. This information is  not intended to replace advice given to you by your health care provider. Make sure you discuss any questions you have with your health care provider. Document Released: 02/15/2000 Document Revised: 09/20/2015 Document Reviewed: 07/05/2014 Elsevier Interactive Patient Education  2018 [employer].

## 2017-08-09 LAB — COMPREHENSIVE METABOLIC PANEL
A/G RATIO: 2 (ref 1.2–2.2)
ALK PHOS: 79 IU/L (ref 39–117)
ALT: 24 IU/L (ref 0–44)
AST: 27 IU/L (ref 0–40)
Albumin: 4.3 g/dL (ref 3.6–4.8)
BUN/Creatinine Ratio: 15 (ref 10–24)
BUN: 12 mg/dL (ref 8–27)
Bilirubin Total: 0.6 mg/dL (ref 0.0–1.2)
CHLORIDE: 104 mmol/L (ref 96–106)
CO2: 21 mmol/L (ref 20–29)
Calcium: 9.1 mg/dL (ref 8.6–10.2)
Creatinine, Ser: 0.82 mg/dL (ref 0.76–1.27)
GFR calc non Af Amer: 93 mL/min/{1.73_m2} (ref >59)
GFR, EST AFRICAN AMERICAN: 108 mL/min/{1.73_m2} (ref >59)
Globulin, Total: 2.1 g/dL (ref 1.5–4.5)
Glucose: 99 mg/dL (ref 65–99)
Potassium: 4.6 mmol/L (ref 3.5–5.2)
Sodium: 142 mmol/L (ref 134–144)
Total Protein: 6.4 g/dL (ref 6.0–8.5)

## 2017-08-09 LAB — PROTIME-INR
INR: 1 (ref 0.8–1.2)
PROTHROMBIN TIME: 10.4 s (ref 9.1–12.0)

## 2017-08-12 ENCOUNTER — Ambulatory Visit: Payer: BLUE CROSS/BLUE SHIELD | Admitting: Emergency Medicine

## 2017-08-12 ENCOUNTER — Other Ambulatory Visit: Payer: Self-pay

## 2017-08-12 ENCOUNTER — Encounter: Payer: Self-pay | Admitting: Emergency Medicine

## 2017-08-12 VITALS — BP 110/62 | HR 71 | Temp 98.3°F | Resp 16 | Wt 227.8 lb

## 2017-08-12 DIAGNOSIS — S90862D Insect bite (nonvenomous), left foot, subsequent encounter: Secondary | ICD-10-CM

## 2017-08-12 DIAGNOSIS — L089 Local infection of the skin and subcutaneous tissue, unspecified: Secondary | ICD-10-CM | POA: Diagnosis not present

## 2017-08-12 DIAGNOSIS — W57XXXD Bitten or stung by nonvenomous insect and other nonvenomous arthropods, subsequent encounter: Secondary | ICD-10-CM | POA: Diagnosis not present

## 2017-08-12 NOTE — Patient Instructions (Addendum)
IF you received an x-ray today, you will receive an invoice from [facility]. Please contact [facility] at [phone] with questions or concerns regarding your invoice.   IF you received labwork today, you will receive an invoice from [place]. Please contact [facility] at [phone] with questions or concerns regarding your invoice.   Our billing staff will not be able to assist you with questions regarding bills from these companies.  You will be contacted with the lab results as soon as they are available. The fastest way to get your results is to activate your My Chart account. Instructions are located on the last page of this paperwork. If you have not heard from [place] regarding the results in 2 weeks, please contact this office.     Insect Bite, Adult An insect bite can make your skin red, itchy, and swollen. Some insects can spread disease to people with a bite. However, most insect bites do not lead to disease, and most are not serious. Follow these instructions at home: Bite area care  Do not scratch the bite area.  Keep the bite area clean and dry.  Wash the bite area every day with soap and water as told by your doctor.  Check the bite area every day for signs of infection. Check for: ? More redness, swelling, or pain. ? Fluid or blood. ? Warmth. ? Pus. Managing pain, itching, and swelling  You may put any of these on the bite area as told by your doctor: ? A baking soda paste. ? Cortisone cream. ? Calamine lotion.  If directed, put ice on the bite area. ? Put ice in a plastic bag. ? Place a towel between your skin and the bag. ? Leave the ice on for 20 minutes, 2-3 times a day. Medicines  Take medicines or put medicines on your skin only as told by your doctor.  If you were prescribed an antibiotic medicine, use it as told by your doctor. Do not stop using the antibiotic even if your condition improves. General instructions  Keep all  follow-up visits as told by your doctor. This is important. How is this prevented? To help you have a lower risk of insect bites:  When you are outside, wear clothing that covers your arms and legs.  Use insect repellent. The best insect repellents have: ? An active ingredient of DEET, picaridin, oil of lemon eucalyptus (OLE), or IR3535. ? Higher amounts of DEET or another active ingredient than other repellents have.  If your home windows do not have screens, think about putting some in.  Contact a doctor if:  You have more redness, swelling, or pain in the bite area.  You have fluid, blood, or pus coming from the bite area.  The bite area feels warm.  You have a fever. Get help right away if:  You have joint pain.  You have a rash.  You have shortness of breath.  You feel more tired or sleepy than you normally do.  You have neck pain.  You have a headache.  You feel weaker than you normally do.  You have chest pain.  You have pain in your belly.  You feel sick to your stomach (nauseous) or you throw up (vomit). Summary  An insect bite can make your skin red, itchy, and swollen.  Do not scratch the bite area, and keep it clean and dry.  Ice can help with pain and itching from the bite. This information is not  intended to replace advice given to you by your health care provider. Make sure you discuss any questions you have with your health care provider. Document Released: 02/15/2000 Document Revised: 09/20/2015 Document Reviewed: 07/05/2014 Elsevier Interactive Patient Education  2018 [employer].
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
## 2017-08-14 ENCOUNTER — Encounter (INDEPENDENT_AMBULATORY_CARE_PROVIDER_SITE_OTHER): Payer: BLUE CROSS/BLUE SHIELD | Admitting: Ophthalmology

## 2017-08-14 DIAGNOSIS — H2513 Age-related nuclear cataract, bilateral: Secondary | ICD-10-CM

## 2017-08-14 DIAGNOSIS — E113313 Type 2 diabetes mellitus with moderate nonproliferative diabetic retinopathy with macular edema, bilateral: Secondary | ICD-10-CM

## 2017-08-14 DIAGNOSIS — H35033 Hypertensive retinopathy, bilateral: Secondary | ICD-10-CM

## 2017-08-14 DIAGNOSIS — H43813 Vitreous degeneration, bilateral: Secondary | ICD-10-CM | POA: Diagnosis not present

## 2017-08-14 DIAGNOSIS — I10 Essential (primary) hypertension: Secondary | ICD-10-CM | POA: Diagnosis not present

## 2017-08-14 DIAGNOSIS — E11311 Type 2 diabetes mellitus with unspecified diabetic retinopathy with macular edema: Secondary | ICD-10-CM | POA: Diagnosis not present

## 2017-08-29 ENCOUNTER — Other Ambulatory Visit: Payer: Self-pay | Admitting: Physician Assistant

## 2017-08-29 DIAGNOSIS — E1140 Type 2 diabetes mellitus with diabetic neuropathy, unspecified: Secondary | ICD-10-CM

## 2017-08-31 NOTE — Telephone Encounter (Signed)
Interface refill request for metformin:  Last refill:  06/25/17   Lov 4.5/19 with Philis Fendt MD   Pharmacy:  [facility]

## 2017-09-01 ENCOUNTER — Other Ambulatory Visit: Payer: BLUE CROSS/BLUE SHIELD

## 2017-09-03 NOTE — Progress Notes (Deleted)
Patient ID: Clayton Ates., male   DOB: 02-20-1951, 67 y.o.   MRN: [id number]           Reason for Appointment: Follow-up for Type 2 Diabetes   History of Present Illness:          Date of diagnosis of type 2 diabetes mellitus:?  2002       Background history:   He thinks he had increased thirst at the time of diagnosis which was probably 12-14 years ago He was treated with metformin for several years Subsequently Amaryl was added and this has been continued Previous level of control is unknown, records available only since 2013  He previously had poorly controlled diabetes and A1c consistently over 9%  Recent history:     He has not been seen in follow-up since 10/2016 His A1c was 7.5, previously done in 5/18 at 7.8   Non-insulin hypoglycemic drugs the patient is taking are: Metformin 1 g twice a day.  Amaryl 4m in a.m.  Invokana 300 mg daily   Current management, blood sugar patterns and problems identified:  He has not been back in follow-up and not checking his blood sugars until this week.  This is because of his busy work schedule  He continues to lose weight with staying regular on [place] Also he is still trying to eat more healthy including when eating out  Also is fairly active with work daily  All the fasting glucose was 153 today he thinks this is from drinking milk during the night.  He is generally cutting back on drinking alcohol compared to previous visit but will occasionally have a regular soft drinks  He is not checking blood sugars after supper and not clear when his blood sugar may be higher  However he thinks that because of lack of prescription refills he was only taking 1 tablet of metformin instead of twice daily until he got a refill recently  Highest blood sugar 188 at around noon but otherwise has checked his blood sugar on only 3 days recently        Side effects from medications have been: None  Compliance with the medical  regimen: Fair Hypoglycemia: None    Glucose monitoring:  done  1-2  times a day         Glucometer: Contour Blood Glucose readings by download as above   Self-care: The diet that the patient has been following is: None reducing fats He is reducing fruit juices such as grapefruit and cranberry    Meal times are:  Breakfast is usually skipped Typical meal intake: Meat and 2 vegetables at lunch, snacks or fruits at dinner               Dietician visit, most recent: Never               Exercise:  none except walking at work and climbing stairs  Weight history:  Wt Readings from Last 3 Encounters:  08/12/17 227 lb 12.8 oz (103.3 kg)  08/08/17 229 lb 3.2 oz (104 kg)  08/06/17 227 lb 6.4 oz (103.1 kg)    Glycemic control:   Lab Results  Component Value Date   HGBA1C 7.5 (H) 06/03/2017   HGBA1C 7.8 07/31/2016   HGBA1C 6.8 03/28/2016   Lab Results  Component Value Date   MICROALBUR 1.4 10/02/2016   LDLCALC 117 (H) 06/03/2017   CREATININE 0.82 08/08/2017   Lab Results  Component Value Date   MICRALBCREAT  1.1 10/02/2016       Allergies as of 09/04/2017      Reactions   Cymbalta [duloxetine Hcl] Nausea And Vomiting   Darvocet [propoxyphene N-acetaminophen] Hives      Medication List        Accurate as of 09/03/17  6:01 PM. Always use your most recent med list.          aspirin 81 MG tablet Take 81 mg by mouth daily.   azelastine 0.1 % nasal spray Commonly known as:  ASTELIN PLACE 2 SPRAYS INTO BOTH NOSTRILS 2 (TWO) TIMES DAILY. USE IN EACH NOSTRIL AS DIRECTED   BAYER CONTOUR NEXT MONITOR w/Device Kit Check blood sugar two times daily.   BAYER CONTOUR NEXT TEST test strip Generic drug:  glucose blood CHECK BLOOD SUGAR TWICE DAILY   BAYER MICROLET LANCETS lancets Check blood sugar two times daily.   cetirizine 10 MG tablet Commonly known as:  ZYRTEC Take 1 tablet (10 mg total) by mouth daily.   doxycycline 100 MG tablet Commonly known as:   VIBRA-TABS Take 1 tablet (100 mg total) by mouth 2 (two) times daily.   FLUARIX QUADRIVALENT 0.5 ML injection Generic drug:  Influenza vac split quadrivalent PF TO BE ADMINISTERED BY PHARMACIST FOR IMMUNIZATION   fluvastatin XL 80 MG 24 hr tablet Commonly known as:  LESCOL XL Take 1 tablet (80 mg total) by mouth daily.   gabapentin 300 MG capsule Commonly known as:  NEURONTIN Take 3 capsules (900 mg total) by mouth 3 (three) times daily.   glimepiride 4 MG tablet Commonly known as:  AMARYL TAKE 1 TABLET (4 MG TOTAL) BY MOUTH 2 (TWO) TIMES DAILY BEFORE A MEAL.   INVOKANA 300 MG Tabs tablet Generic drug:  canagliflozin TAKE 1 TABLET (300 MG TOTAL) BY MOUTH DAILY BEFORE BREAKFAST.   metFORMIN 1000 MG tablet Commonly known as:  GLUCOPHAGE TAKE 1 TABLET BY MOUTH TWICE A DAY   metFORMIN 1000 MG tablet Commonly known as:  GLUCOPHAGE TAKE 1 TABLET BY MOUTH TWICE A DAY   omeprazole 20 MG capsule Commonly known as:  PRILOSEC TAKE 1 CAPSULE (20 MG TOTAL) BY MOUTH DAILY. OV NEEDED FOR ADDITIONAL REFILLS   rosuvastatin 5 MG tablet Commonly known as:  CRESTOR Take 1 tablet (5 mg total) by mouth daily.   traMADol 50 MG tablet Commonly known as:  ULTRAM Take 1 tablet (50 mg total) by mouth at bedtime.   vitamin B-12 1000 MCG tablet Commonly known as:  CYANOCOBALAMIN Take 1,000 mcg by mouth daily.       Allergies:  Allergies  Allergen Reactions  . Cymbalta [Duloxetine Hcl] Nausea And Vomiting  . Darvocet [Propoxyphene N-Acetaminophen] Hives    Past Medical History:  Diagnosis Date  . Cancer [facility])    Prostate cancer age 76; Lupron treatments followed by prostatectomy.  . Diabetes mellitus without complication ([place])   . GERD (gastroesophageal reflux disease)   . Hyperlipidemia   . Hypertension   . Myocardial infarction [facility])    age 23; no stenting.  . OSA (obstructive sleep apnea)    non-compliant with CPAP.    Past Surgical History:  Procedure Laterality Date  .  CHOLECYSTECTOMY    . PROSTATE SURGERY      Family History  Problem Relation Age of Onset  . Cancer Mother        leukemia  . Diabetes Mother   . Cancer Father 79       lung cancer with mets  . Heart  disease Maternal Grandmother   . Cancer Maternal Grandfather   . Heart disease Paternal Grandmother   . Cancer Paternal Grandfather     Social History:  reports that he has never smoked. His smokeless tobacco use includes chew. He reports that he drinks alcohol. He reports that he does not use drugs.   Review of Systems   Lipid history: Followed by PCP  Triglycerides improved However LDL is higher and he has not taken fluvastatin or the recently prescribed Crestor  He thinks he gets weakness from statin drugs   Lab Results  Component Value Date   CHOL 210 (H) 06/03/2017   HDL 68.70 06/03/2017   LDLCALC 117 (H) 06/03/2017   LDLDIRECT 143.0 10/02/2016   TRIG 122.0 06/03/2017   CHOLHDL 3 06/03/2017            Most recent eye exam was 4/19  NEUROPATHY: He Is  taking 900 mg tid with variable relief of his symptoms  He says he is using an over-the-counter liquid preparation on his joints and feet derived from hemp and is getting relief   He was found by his ophthalmologist to have severe retinopathy and probably also macular edema for which he is getting injections now    Physical Examination:  There were no vitals taken for this visit.      ASSESSMENT:  Diabetes type 2, recent BMI 32  See history of present illness for detailed discussion of current diabetes management, blood sugar patterns and problems identified  His A1c is 7.5  Although he has generally done well with adding Invokana to Amaryl and metformin he is not getting consistent control This is partly related to his not taking metformin recently Difficult to know whether he is getting consistent hypoglycemia since he is monitoring much at home except this week He has lost some weight  further   NEUROPATHY: He is trying and over-the-counter topical treatment with some relief  HYPERLIPIDEMIA: He has consistently high LDL And triglyceride better with cutting back on alcohol intake  PLAN:    To take medications consistently  More regular follow-up  To try checking blood sugars after evening meal more consistently  We will adjust his medication based on blood sugar patterns on the next visit For his hypercholesterolemia since he did tolerate 20 mg fluvastatin previously he can try taking the Lescol XL every other day   There are no Patient Instructions on file for this visit.      Elayne Snare 09/03/2017, 6:01 PM   Note: This office note was prepared with Dragon voice recognition system technology. Any transcriptional errors that result from this process are unintentional.

## 2017-09-04 ENCOUNTER — Ambulatory Visit: Payer: BLUE CROSS/BLUE SHIELD | Admitting: Endocrinology

## 2017-09-04 DIAGNOSIS — Z0289 Encounter for other administrative examinations: Secondary | ICD-10-CM

## 2017-09-12 DIAGNOSIS — M79672 Pain in left foot: Secondary | ICD-10-CM | POA: Diagnosis not present

## 2017-09-12 DIAGNOSIS — S91302A Unspecified open wound, left foot, initial encounter: Secondary | ICD-10-CM | POA: Diagnosis not present

## 2017-09-12 DIAGNOSIS — E1140 Type 2 diabetes mellitus with diabetic neuropathy, unspecified: Secondary | ICD-10-CM | POA: Diagnosis not present

## 2017-09-12 DIAGNOSIS — K219 Gastro-esophageal reflux disease without esophagitis: Secondary | ICD-10-CM | POA: Diagnosis not present

## 2017-09-14 DIAGNOSIS — L97521 Non-pressure chronic ulcer of other part of left foot limited to breakdown of skin: Secondary | ICD-10-CM | POA: Diagnosis not present

## 2017-09-14 DIAGNOSIS — E1140 Type 2 diabetes mellitus with diabetic neuropathy, unspecified: Secondary | ICD-10-CM | POA: Diagnosis not present

## 2017-09-22 DIAGNOSIS — E1140 Type 2 diabetes mellitus with diabetic neuropathy, unspecified: Secondary | ICD-10-CM | POA: Diagnosis not present

## 2017-09-22 DIAGNOSIS — L97522 Non-pressure chronic ulcer of other part of left foot with fat layer exposed: Secondary | ICD-10-CM | POA: Diagnosis not present

## 2017-10-02 DIAGNOSIS — E1140 Type 2 diabetes mellitus with diabetic neuropathy, unspecified: Secondary | ICD-10-CM | POA: Diagnosis not present

## 2017-10-02 DIAGNOSIS — L97522 Non-pressure chronic ulcer of other part of left foot with fat layer exposed: Secondary | ICD-10-CM | POA: Diagnosis not present

## 2017-10-05 ENCOUNTER — Encounter: Payer: Self-pay | Admitting: Emergency Medicine

## 2017-10-09 ENCOUNTER — Other Ambulatory Visit: Payer: Self-pay | Admitting: Physician Assistant

## 2017-10-09 DIAGNOSIS — E1140 Type 2 diabetes mellitus with diabetic neuropathy, unspecified: Secondary | ICD-10-CM

## 2017-10-16 ENCOUNTER — Encounter (INDEPENDENT_AMBULATORY_CARE_PROVIDER_SITE_OTHER): Payer: BLUE CROSS/BLUE SHIELD | Admitting: Ophthalmology

## 2017-10-16 ENCOUNTER — Encounter (INDEPENDENT_AMBULATORY_CARE_PROVIDER_SITE_OTHER): Payer: Self-pay

## 2017-10-23 ENCOUNTER — Encounter (INDEPENDENT_AMBULATORY_CARE_PROVIDER_SITE_OTHER): Payer: BLUE CROSS/BLUE SHIELD | Admitting: Ophthalmology

## 2017-10-23 DIAGNOSIS — E1140 Type 2 diabetes mellitus with diabetic neuropathy, unspecified: Secondary | ICD-10-CM | POA: Diagnosis not present

## 2017-10-23 DIAGNOSIS — H43813 Vitreous degeneration, bilateral: Secondary | ICD-10-CM

## 2017-10-23 DIAGNOSIS — I10 Essential (primary) hypertension: Secondary | ICD-10-CM

## 2017-10-23 DIAGNOSIS — E11311 Type 2 diabetes mellitus with unspecified diabetic retinopathy with macular edema: Secondary | ICD-10-CM | POA: Diagnosis not present

## 2017-10-23 DIAGNOSIS — H35033 Hypertensive retinopathy, bilateral: Secondary | ICD-10-CM

## 2017-10-23 DIAGNOSIS — H2513 Age-related nuclear cataract, bilateral: Secondary | ICD-10-CM

## 2017-10-23 DIAGNOSIS — E113313 Type 2 diabetes mellitus with moderate nonproliferative diabetic retinopathy with macular edema, bilateral: Secondary | ICD-10-CM

## 2017-10-23 DIAGNOSIS — L97522 Non-pressure chronic ulcer of other part of left foot with fat layer exposed: Secondary | ICD-10-CM | POA: Diagnosis not present

## 2017-12-21 ENCOUNTER — Other Ambulatory Visit: Payer: Self-pay | Admitting: Physician Assistant

## 2017-12-21 DIAGNOSIS — E1140 Type 2 diabetes mellitus with diabetic neuropathy, unspecified: Secondary | ICD-10-CM

## 2018-01-01 ENCOUNTER — Encounter (INDEPENDENT_AMBULATORY_CARE_PROVIDER_SITE_OTHER): Payer: BLUE CROSS/BLUE SHIELD | Admitting: Ophthalmology

## 2018-01-01 DIAGNOSIS — H35033 Hypertensive retinopathy, bilateral: Secondary | ICD-10-CM | POA: Diagnosis not present

## 2018-01-01 DIAGNOSIS — H2513 Age-related nuclear cataract, bilateral: Secondary | ICD-10-CM

## 2018-01-01 DIAGNOSIS — E11311 Type 2 diabetes mellitus with unspecified diabetic retinopathy with macular edema: Secondary | ICD-10-CM | POA: Diagnosis not present

## 2018-01-01 DIAGNOSIS — H43813 Vitreous degeneration, bilateral: Secondary | ICD-10-CM

## 2018-01-01 DIAGNOSIS — I10 Essential (primary) hypertension: Secondary | ICD-10-CM | POA: Diagnosis not present

## 2018-01-01 DIAGNOSIS — E113213 Type 2 diabetes mellitus with mild nonproliferative diabetic retinopathy with macular edema, bilateral: Secondary | ICD-10-CM

## 2018-01-18 ENCOUNTER — Other Ambulatory Visit: Payer: Self-pay | Admitting: Endocrinology

## 2018-01-18 DIAGNOSIS — E1140 Type 2 diabetes mellitus with diabetic neuropathy, unspecified: Secondary | ICD-10-CM

## 2018-01-21 ENCOUNTER — Other Ambulatory Visit: Payer: Self-pay | Admitting: Endocrinology

## 2018-01-21 DIAGNOSIS — E1140 Type 2 diabetes mellitus with diabetic neuropathy, unspecified: Secondary | ICD-10-CM

## 2018-01-22 ENCOUNTER — Other Ambulatory Visit: Payer: Self-pay

## 2018-01-22 DIAGNOSIS — E1140 Type 2 diabetes mellitus with diabetic neuropathy, unspecified: Secondary | ICD-10-CM

## 2018-01-22 MED ORDER — METFORMIN HCL 1000 MG PO TABS: 1000.0000 mg | ORAL_TABLET | Freq: Two times a day (BID) | ORAL | 1 refills | Status: DC

## 2018-02-12 DIAGNOSIS — R072 Precordial pain: Secondary | ICD-10-CM | POA: Diagnosis not present

## 2018-02-12 DIAGNOSIS — R300 Dysuria: Secondary | ICD-10-CM | POA: Diagnosis not present

## 2018-02-12 DIAGNOSIS — R0602 Shortness of breath: Secondary | ICD-10-CM | POA: Diagnosis not present

## 2018-02-12 DIAGNOSIS — E119 Type 2 diabetes mellitus without complications: Secondary | ICD-10-CM | POA: Diagnosis not present

## 2018-02-12 DIAGNOSIS — Z77098 Contact with and (suspected) exposure to other hazardous, chiefly nonmedicinal, chemicals: Secondary | ICD-10-CM | POA: Diagnosis not present

## 2018-02-12 DIAGNOSIS — F17210 Nicotine dependence, cigarettes, uncomplicated: Secondary | ICD-10-CM | POA: Diagnosis not present

## 2018-03-05 DIAGNOSIS — E113493 Type 2 diabetes mellitus with severe nonproliferative diabetic retinopathy without macular edema, bilateral: Secondary | ICD-10-CM | POA: Diagnosis not present

## 2018-03-05 DIAGNOSIS — H10413 Chronic giant papillary conjunctivitis, bilateral: Secondary | ICD-10-CM | POA: Diagnosis not present

## 2018-03-05 DIAGNOSIS — H25813 Combined forms of age-related cataract, bilateral: Secondary | ICD-10-CM | POA: Diagnosis not present

## 2018-03-12 ENCOUNTER — Encounter (INDEPENDENT_AMBULATORY_CARE_PROVIDER_SITE_OTHER): Payer: BLUE CROSS/BLUE SHIELD | Admitting: Ophthalmology

## 2018-03-16 ENCOUNTER — Ambulatory Visit: Payer: BLUE CROSS/BLUE SHIELD | Admitting: Family Medicine

## 2018-03-16 ENCOUNTER — Encounter: Payer: Self-pay | Admitting: Family Medicine

## 2018-03-16 ENCOUNTER — Other Ambulatory Visit: Payer: Self-pay

## 2018-03-16 VITALS — BP 140/80 | HR 83 | Temp 98.5°F | Resp 16 | Ht 70.0 in | Wt 238.4 lb

## 2018-03-16 DIAGNOSIS — J019 Acute sinusitis, unspecified: Secondary | ICD-10-CM | POA: Diagnosis not present

## 2018-03-16 MED ORDER — AMOXICILLIN-POT CLAVULANATE 875-125 MG PO TABS: 1.0000 | ORAL_TABLET | Freq: Two times a day (BID) | ORAL | 0 refills | Status: DC

## 2018-03-16 NOTE — Patient Instructions (Addendum)
Start antibiotic. Drink plenty of fluids and can try saline nasal spray as well for nasal congestion.  Ok to continue mucinex. Return to the clinic or go to the nearest emergency room if any of your symptoms worsen or new symptoms occur.    Sinusitis, Adult Sinusitis is inflammation of your sinuses. Sinuses are hollow spaces in the bones around your face. Your sinuses are located:  Around your eyes.  In the middle of your forehead.  Behind your nose.  In your cheekbones. Mucus normally drains out of your sinuses. When your nasal tissues become inflamed or swollen, mucus can become trapped or blocked. This allows bacteria, viruses, and fungi to grow, which leads to infection. Most infections of the sinuses are caused by a virus. Sinusitis can develop quickly. It can last for up to 4 weeks (acute) or for more than 12 weeks (chronic). Sinusitis often develops after a cold. What are the causes? This condition is caused by anything that creates swelling in the sinuses or stops mucus from draining. This includes:  Allergies.  Asthma.  Infection from bacteria or viruses.  Deformities or blockages in your nose or sinuses.  Abnormal growths in the nose (nasal polyps).  Pollutants, such as chemicals or irritants in the air.  Infection from fungi (rare). What increases the risk? You are more likely to develop this condition if you:  Have a weak body defense system (immune system).  Do a lot of swimming or diving.  Overuse nasal sprays.  Smoke. What are the signs or symptoms? The main symptoms of this condition are pain and a feeling of pressure around the affected sinuses. Other symptoms include:  Stuffy nose or congestion.  Thick drainage from your nose.  Swelling and warmth over the affected sinuses.  Headache.  Upper toothache.  A cough that may get worse at night.  Extra mucus that collects in the throat or the back of the nose (postnasal drip).  Decreased  sense of smell and taste.  Fatigue.  A fever.  Sore throat.  Bad breath. How is this diagnosed? This condition is diagnosed based on:  Your symptoms.  Your medical history.  A physical exam.  Tests to find out if your condition is acute or chronic. This may include: ? Checking your nose for nasal polyps. ? Viewing your sinuses using a device that has a light (endoscope). ? Testing for allergies or bacteria. ? Imaging tests, such as an MRI or CT scan. In rare cases, a bone biopsy may be done to rule out more serious types of fungal sinus disease. How is this treated? Treatment for sinusitis depends on the cause and whether your condition is chronic or acute.  If caused by a virus, your symptoms should go away on their own within 10 days. You may be given medicines to relieve symptoms. They include: ? Medicines that shrink swollen nasal passages (topical intranasal decongestants). ? Medicines that treat allergies (antihistamines). ? A spray that eases inflammation of the nostrils (topical intranasal corticosteroids). ? Rinses that help get rid of thick mucus in your nose (nasal saline washes).  If caused by bacteria, your health care provider may recommend waiting to see if your symptoms improve. Most bacterial infections will get better without antibiotic medicine. You may be given antibiotics if you have: ? A severe infection. ? A weak immune system.  If caused by narrow nasal passages or nasal polyps, you may need to have surgery. Follow these instructions at home: Medicines  Take,  use, or apply over-the-counter and prescription medicines only as told by your health care provider. These may include nasal sprays.  If you were prescribed an antibiotic medicine, take it as told by your health care provider. Do not stop taking the antibiotic even if you start to feel better. Hydrate and humidify   Drink enough fluid to keep your urine pale yellow. Staying hydrated will  help to thin your mucus.  Use a cool mist humidifier to keep the humidity level in your home above 50%.  Inhale steam for 10-15 minutes, 3-4 times a day, or as told by your health care provider. You can do this in the bathroom while a hot shower is running.  Limit your exposure to cool or dry air. Rest  Rest as much as possible.  Sleep with your head raised (elevated).  Make sure you get enough sleep each night. General instructions   Apply a warm, moist washcloth to your face 3-4 times a day or as told by your health care provider. This will help with discomfort.  Wash your hands often with soap and water to reduce your exposure to germs. If soap and water are not available, use hand sanitizer.  Do not smoke. Avoid being around people who are smoking (secondhand smoke).  Keep all follow-up visits as told by your health care provider. This is important. Contact a health care provider if:  You have a fever.  Your symptoms get worse.  Your symptoms do not improve within 10 days. Get help right away if:  You have a severe headache.  You have persistent vomiting.  You have severe pain or swelling around your face or eyes.  You have vision problems.  You develop confusion.  Your neck is stiff.  You have trouble breathing. Summary  Sinusitis is soreness and inflammation of your sinuses. Sinuses are hollow spaces in the bones around your face.  This condition is caused by nasal tissues that become inflamed or swollen. The swelling traps or blocks the flow of mucus. This allows bacteria, viruses, and fungi to grow, which leads to infection.  If you were prescribed an antibiotic medicine, take it as told by your health care provider. Do not stop taking the antibiotic even if you start to feel better.  Keep all follow-up visits as told by your health care provider. This is important. This information is not intended to replace advice given to you by your health care  provider. Make sure you discuss any questions you have with your health care provider. Document Released: 02/17/2005 Document Revised: 07/20/2017 Document Reviewed: 07/20/2017 Elsevier Interactive Patient Education  [employer].   If you have lab work done today you will be contacted with your lab results within the next 2 weeks.  If you have not heard from [place] then please contact us. The fastest way to get your results is to register for My Chart.   IF you received an x-ray today, you will receive an invoice from [facility]. Please contact [facility] at [phone] with questions or concerns regarding your invoice.   IF you received labwork today, you will receive an invoice from [place]. Please contact [facility] at [phone] with questions or concerns regarding your invoice.   Our billing staff will not be able to assist you with questions regarding bills from these companies.  You will be contacted with the lab results as soon as they are available. The fastest way to get your results is to activate your My  Chart account. Instructions are located on the last page of this paperwork. If you have not heard from [place] regarding the results in 2 weeks, please contact this office.
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
## 2018-04-30 ENCOUNTER — Encounter (INDEPENDENT_AMBULATORY_CARE_PROVIDER_SITE_OTHER): Payer: BLUE CROSS/BLUE SHIELD | Admitting: Ophthalmology

## 2018-04-30 DIAGNOSIS — E113313 Type 2 diabetes mellitus with moderate nonproliferative diabetic retinopathy with macular edema, bilateral: Secondary | ICD-10-CM

## 2018-04-30 DIAGNOSIS — H43813 Vitreous degeneration, bilateral: Secondary | ICD-10-CM

## 2018-04-30 DIAGNOSIS — H2513 Age-related nuclear cataract, bilateral: Secondary | ICD-10-CM

## 2018-04-30 DIAGNOSIS — E11311 Type 2 diabetes mellitus with unspecified diabetic retinopathy with macular edema: Secondary | ICD-10-CM

## 2018-04-30 DIAGNOSIS — H35033 Hypertensive retinopathy, bilateral: Secondary | ICD-10-CM | POA: Diagnosis not present

## 2018-04-30 DIAGNOSIS — I10 Essential (primary) hypertension: Secondary | ICD-10-CM | POA: Diagnosis not present

## 2018-05-01 ENCOUNTER — Other Ambulatory Visit: Payer: Self-pay | Admitting: Endocrinology

## 2018-05-01 DIAGNOSIS — E1140 Type 2 diabetes mellitus with diabetic neuropathy, unspecified: Secondary | ICD-10-CM

## 2018-05-04 ENCOUNTER — Other Ambulatory Visit: Payer: Self-pay | Admitting: Physician Assistant

## 2018-05-04 DIAGNOSIS — G8929 Other chronic pain: Secondary | ICD-10-CM

## 2018-05-29 ENCOUNTER — Other Ambulatory Visit: Payer: Self-pay | Admitting: Physician Assistant

## 2018-05-29 DIAGNOSIS — G8929 Other chronic pain: Secondary | ICD-10-CM

## 2018-07-21 ENCOUNTER — Telehealth: Payer: Self-pay | Admitting: Endocrinology

## 2018-07-21 ENCOUNTER — Other Ambulatory Visit: Payer: Self-pay | Admitting: Physician Assistant

## 2018-07-21 DIAGNOSIS — G8929 Other chronic pain: Secondary | ICD-10-CM

## 2018-07-21 DIAGNOSIS — E1140 Type 2 diabetes mellitus with diabetic neuropathy, unspecified: Secondary | ICD-10-CM

## 2018-07-21 NOTE — Telephone Encounter (Signed)
Patient scheduled an appointment on the next available day on August 03, 2018.

## 2018-07-30 ENCOUNTER — Other Ambulatory Visit: Payer: Self-pay

## 2018-07-30 ENCOUNTER — Encounter (INDEPENDENT_AMBULATORY_CARE_PROVIDER_SITE_OTHER): Payer: Medicare HMO | Admitting: Ophthalmology

## 2018-07-30 DIAGNOSIS — H35033 Hypertensive retinopathy, bilateral: Secondary | ICD-10-CM

## 2018-07-30 DIAGNOSIS — E11311 Type 2 diabetes mellitus with unspecified diabetic retinopathy with macular edema: Secondary | ICD-10-CM | POA: Diagnosis not present

## 2018-07-30 DIAGNOSIS — I10 Essential (primary) hypertension: Secondary | ICD-10-CM | POA: Diagnosis not present

## 2018-07-30 DIAGNOSIS — E113313 Type 2 diabetes mellitus with moderate nonproliferative diabetic retinopathy with macular edema, bilateral: Secondary | ICD-10-CM

## 2018-07-30 DIAGNOSIS — H2513 Age-related nuclear cataract, bilateral: Secondary | ICD-10-CM | POA: Diagnosis not present

## 2018-07-30 DIAGNOSIS — H43813 Vitreous degeneration, bilateral: Secondary | ICD-10-CM | POA: Diagnosis not present

## 2018-08-03 ENCOUNTER — Ambulatory Visit: Payer: Medicare HMO | Admitting: Endocrinology

## 2018-08-03 ENCOUNTER — Encounter: Payer: Self-pay | Admitting: Endocrinology

## 2018-08-03 ENCOUNTER — Other Ambulatory Visit: Payer: Self-pay

## 2018-08-03 VITALS — BP 122/60 | HR 92 | Ht 70.0 in | Wt 229.8 lb

## 2018-08-03 DIAGNOSIS — E1142 Type 2 diabetes mellitus with diabetic polyneuropathy: Secondary | ICD-10-CM | POA: Diagnosis not present

## 2018-08-03 DIAGNOSIS — E7800 Pure hypercholesterolemia, unspecified: Secondary | ICD-10-CM | POA: Diagnosis not present

## 2018-08-03 DIAGNOSIS — E1140 Type 2 diabetes mellitus with diabetic neuropathy, unspecified: Secondary | ICD-10-CM

## 2018-08-03 LAB — URINALYSIS, ROUTINE W REFLEX MICROSCOPIC
Bilirubin Urine: NEGATIVE
Hgb urine dipstick: NEGATIVE
Leukocytes,Ua: NEGATIVE
Nitrite: NEGATIVE
RBC / HPF: NONE SEEN (ref 0–?)
Specific Gravity, Urine: >=1.03 — AB (ref 1.000–1.030)
Total Protein, Urine: NEGATIVE
Urine Glucose: 500 — AB
Urobilinogen, UA: 0.2 (ref 0.0–1.0)
pH: 5 (ref 5.0–8.0)

## 2018-08-03 LAB — COMPREHENSIVE METABOLIC PANEL
ALT: 30 U/L (ref 0–53)
AST: 35 U/L (ref 0–37)
Albumin: 4.1 g/dL (ref 3.5–5.2)
Alkaline Phosphatase: 61 U/L (ref 39–117)
BUN: 12 mg/dL (ref 6–23)
CO2: 26 mEq/L (ref 19–32)
Calcium: 8.9 mg/dL (ref 8.4–10.5)
Chloride: 101 mEq/L (ref 96–112)
Creatinine, Ser: 0.83 mg/dL (ref 0.40–1.50)
GFR: 92.83 mL/min (ref >60.00)
Glucose, Bld: 180 mg/dL — ABNORMAL HIGH (ref 70–99)
Potassium: 3.8 mEq/L (ref 3.5–5.1)
Sodium: 138 mEq/L (ref 135–145)
Total Bilirubin: 0.6 mg/dL (ref 0.2–1.2)
Total Protein: 6.8 g/dL (ref 6.0–8.3)

## 2018-08-03 LAB — CBC WITH DIFFERENTIAL/PLATELET
Basophils Absolute: 0 10*3/uL (ref 0.0–0.1)
Basophils Relative: 0.6 % (ref 0.0–3.0)
Eosinophils Absolute: 0.1 10*3/uL (ref 0.0–0.7)
Eosinophils Relative: 2.1 % (ref 0.0–5.0)
HCT: 44.5 % (ref 39.0–52.0)
Hemoglobin: 15.2 g/dL (ref 13.0–17.0)
Lymphocytes Relative: 39.7 % (ref 12.0–46.0)
Lymphs Abs: 2.1 10*3/uL (ref 0.7–4.0)
MCHC: 34.2 g/dL (ref 30.0–36.0)
MCV: 95.7 fl (ref 78.0–100.0)
Monocytes Absolute: 0.5 10*3/uL (ref 0.1–1.0)
Monocytes Relative: 8.9 % (ref 3.0–12.0)
Neutro Abs: 2.5 10*3/uL (ref 1.4–7.7)
Neutrophils Relative %: 48.7 % (ref 43.0–77.0)
Platelets: 163 10*3/uL (ref 150.0–400.0)
RBC: 4.65 Mil/uL (ref 4.22–5.81)
RDW: 13.7 % (ref 11.5–15.5)
WBC: 5.2 10*3/uL (ref 4.0–10.5)

## 2018-08-03 LAB — LDL CHOLESTEROL, DIRECT: Direct LDL: 112 mg/dL

## 2018-08-03 LAB — GLUCOSE, POCT (MANUAL RESULT ENTRY): POC Glucose: 192 mg/dl — AB (ref 70–99)

## 2018-08-03 LAB — POCT GLYCOSYLATED HEMOGLOBIN (HGB A1C): Hemoglobin A1C: 6.7 % — AB (ref 4.0–5.6)

## 2018-08-03 LAB — LIPID PANEL
Cholesterol: 202 mg/dL — ABNORMAL HIGH (ref 0–200)
HDL: 54.5 mg/dL (ref >39.00)
NonHDL: 147.8
Total CHOL/HDL Ratio: 4
Triglycerides: 306 mg/dL — ABNORMAL HIGH (ref 0.0–149.0)
VLDL: 61.2 mg/dL — ABNORMAL HIGH (ref 0.0–40.0)

## 2018-08-03 LAB — MICROALBUMIN / CREATININE URINE RATIO
Creatinine,U: 174.3 mg/dL
Microalb Creat Ratio: 2.9 mg/g (ref 0.0–30.0)
Microalb, Ur: 5.1 mg/dL — ABNORMAL HIGH (ref 0.0–1.9)

## 2018-08-03 MED ORDER — FLUVASTATIN SODIUM ER 80 MG PO TB24: 80.0000 mg | ORAL_TABLET | Freq: Every day | ORAL | 3 refills | Status: DC

## 2018-08-03 MED ORDER — METFORMIN HCL ER 500 MG PO TB24: 2000.0000 mg | ORAL_TABLET | Freq: Every day | ORAL | 3 refills | Status: DC

## 2018-08-03 NOTE — Progress Notes (Signed)
Patient ID: [name], male   DOB: [birth year], 68 y.o.   MRN: [id number]           Reason for Appointment: Follow-up for Type 2 Diabetes   History of Present Illness:          Date of diagnosis of type 2 diabetes mellitus:?  2002       Background history:   He thinks he had increased thirst at the time of diagnosis which was probably 12-14 years ago He was treated with metformin for several years Subsequently Amaryl was added and this has been continued Previous level of control is unknown, records available only since 2013  He previously had poorly controlled diabetes and A1c consistently over 9%  Recent history:     He has not been seen in follow-up since 4/19  His A1c was previously 7.5 and now 6.7     Non-insulin hypoglycemic drugs the patient is taking are: Metformin 1 g twice a day   Current management, blood sugar patterns and problems identified:  He has not been back in follow-up as directed  As before he is not checking his blood sugars until he checked it last night and it was 177  He thinks that about a year ago he had difficulty affording his Invokana and he did not call about this and stopped it on his own  Also at some point he stopped glimepiride  Recently has been out of work and he has been more active at home and around and he thinks he is losing weight  Blood sugar was high today in the office but he had a regular soft drink  He has been usually trying to avoid soft drinks but not consistently as before  He says that overall his appetite is not as good and if he does not take a breakfast meal he will have nausea with the metformin        Side effects from medications have been: None  Compliance with the medical regimen: Fair   Glucose monitoring:  As above      Glucometer: Contour Blood Glucose readings not available    Meal times are:  Breakfast is usually small [place] Typical meal intake: Meat and 2 vegetables at lunch, snacks  or fruits at dinner               Dietician visit, most recent: Never                Weight history:  Wt Readings from Last 3 Encounters:  08/03/18 229 lb 12.8 oz (104.2 kg)  03/16/18 238 lb 6.4 oz (108.1 kg)  08/12/17 227 lb 12.8 oz (103.3 kg)    Glycemic control:   Lab Results  Component Value Date   HGBA1C 6.7 (A) 08/03/2018   HGBA1C 7.5 (H) 06/03/2017   HGBA1C 7.8 07/31/2016   Lab Results  Component Value Date   MICROALBUR 1.4 10/02/2016   LDLCALC 117 (H) 06/03/2017   CREATININE 0.82 08/08/2017   Lab Results  Component Value Date   MICRALBCREAT 1.1 10/02/2016       Allergies as of 08/03/2018      Reactions   Cymbalta [duloxetine Hcl] Nausea And Vomiting   Darvocet [propoxyphene N-acetaminophen] Hives      Medication List       Accurate as of August 03, 2018  8:58 AM. If you have any questions, ask your nurse or doctor.        STOP taking these  medications   amoxicillin-clavulanate 875-125 MG tablet Commonly known as:  AUGMENTIN Stopped by:  Elayne Snare, MD   cetirizine 10 MG tablet Commonly known as:  ZYRTEC Stopped by:  Elayne Snare, MD   fluvastatin XL 80 MG 24 hr tablet Commonly known as:  LESCOL XL Stopped by:  Elayne Snare, MD   glimepiride 4 MG tablet Commonly known as:  AMARYL Stopped by:  Elayne Snare, MD   Invokana 300 MG Tabs tablet Generic drug:  canagliflozin Stopped by:  Elayne Snare, MD   rosuvastatin 5 MG tablet Commonly known as:  Crestor Stopped by:  Elayne Snare, MD     TAKE these medications   aspirin 81 MG tablet Take 81 mg by mouth daily.   azelastine 0.1 % nasal spray Commonly known as:  ASTELIN PLACE 2 SPRAYS INTO BOTH NOSTRILS 2 (TWO) TIMES DAILY. USE IN EACH NOSTRIL AS DIRECTED   Bayer Contour Next Monitor w/Device Kit Check blood sugar two times daily.   Bayer Contour Next Test test strip Generic drug:  glucose blood CHECK BLOOD SUGAR TWICE DAILY   Bayer Microlet Lancets lancets Check blood sugar two times daily.    gabapentin 300 MG capsule Commonly known as:  NEURONTIN TAKE 3 CAPSULES (900 MG TOTAL) BY MOUTH 3 (THREE) TIMES DAILY.   metFORMIN 1000 MG tablet Commonly known as:  GLUCOPHAGE TAKE 1 TABLET BY MOUTH TWICE A DAY   omeprazole 20 MG capsule Commonly known as:  PRILOSEC TAKE 1 CAPSULE (20 MG TOTAL) BY MOUTH DAILY. OV NEEDED FOR ADDITIONAL REFILLS   traMADol 50 MG tablet Commonly known as:  ULTRAM Take 1 tablet (50 mg total) by mouth at bedtime.   vitamin B-12 1000 MCG tablet Commonly known as:  CYANOCOBALAMIN Take 1,000 mcg by mouth daily.       Allergies:  Allergies  Allergen Reactions  . Cymbalta [Duloxetine Hcl] Nausea And Vomiting  . Darvocet [Propoxyphene N-Acetaminophen] Hives    Past Medical History:  Diagnosis Date  . Cancer [facility])    Prostate cancer age 68; Lupron treatments followed by prostatectomy.  . Diabetes mellitus without complication ([place])   . GERD (gastroesophageal reflux disease)   . Hyperlipidemia   . Hypertension   . Myocardial infarction [facility])    age 17; no stenting.  . OSA (obstructive sleep apnea)    non-compliant with CPAP.    Past Surgical History:  Procedure Laterality Date  . CHOLECYSTECTOMY    . PROSTATE SURGERY      Family History  Problem Relation Age of Onset  . Cancer Mother        leukemia  . Diabetes Mother   . Cancer Father 80       lung cancer with mets  . Heart disease Maternal Grandmother   . Cancer Maternal Grandfather   . Heart disease Paternal Grandmother   . Cancer Paternal Grandfather     Social History:  reports that he has never smoked. His smokeless tobacco use includes chew. He reports current alcohol use. He reports that he does not use drugs.   Review of Systems   Lipid history: Has not been taking his statin drug, has been on various treatments, most recently was taking fluvastatin  He thinks he gets weakness from statin drugs   Lab Results  Component Value Date   CHOL 210 (H) 06/03/2017    HDL 68.70 06/03/2017   LDLCALC 117 (H) 06/03/2017   LDLDIRECT 143.0 10/02/2016   TRIG 122.0 06/03/2017   CHOLHDL 3 06/03/2017  NEUROPATHY: He Is  taking 900 mg tid gabapentin.  Has more symptoms at night of pain and burning   He was found by his ophthalmologist to have severe retinopathy and probably also macular edema for which he is getting injections     Physical Examination:  BP 122/60 (BP Location: Left Arm, Patient Position: Sitting, Cuff Size: Normal)   Pulse 92   Ht '5\' 10"'  (1.778 m)   Wt 229 lb 12.8 oz (104.2 kg)   SpO2 97%   BMI 32.97 kg/m     Diabetic Foot Exam - Simple   Simple Foot Form Diabetic Foot exam was performed with the following findings:  Yes   Visual Inspection No deformities, no ulcerations, no other skin breakdown bilaterally:  Yes Sensation Testing See comments:  Yes Pulse Check See comments:  Yes Comments Nearly absent monofilament sensation on the distal plantar surfaces bilaterally Decreased monofilament sensation on the distal toes on the left and partially absent on the right distal toes Posterior tibialis not felt on the left otherwise normal pulses       ASSESSMENT:  Diabetes type 2, recent BMI 32  See history of present illness for detailed discussion of current diabetes management, blood sugar patterns and problems identified  His A1c is 6.7, previously 7.5  Surprisingly with not taking Invokana and Amaryl his blood sugars are not significantly high except when he goes off his diet including this morning Currently not motivated to check his sugars He is likely getting better control with more home cooked meals lately and being more active overall as well as less stress with not working Has some nausea with metformin especially if he takes it without adequate meals   NEUROPATHY: Continue gabapentin for symptomatic relief Needs to check feet regularly because of his decreased sensation  HYPERLIPIDEMIA: He has  not been on any medication Previously has had significant hyperlipidemia Needs follow-up labs although is nonfasting today He tends to have higher triglycerides related to inadequate diabetes control, inconsistent diet and some alcohol intake also   PLAN:    To take metformin ER instead of regular metformin for better tolerability  Discussed needing to cut back on alcohol intake, higher fat meals and avoid regular soft things consistently  Start checking blood sugars consistently rotating fasting and after meal readings, discussed postprandial targets  May consider adding Invokana or Jardiance if blood sugars tend to be higher consistently after meals, this may also be beneficial with cardiovascular risk reduction  Bring monitor for download on each visit  For his lipids he will need to go back to taking Lescol XL 80 mg, can start with 80 mg every other day    There are no Patient Instructions on file for this visit.     Total visit time for evaluation and management of multiple problems and counseling =25 minutes  Elayne Snare 08/03/2018, 8:58 AM   Note: This office note was prepared with Dragon voice recognition system technology. Any transcriptional errors that result from this process are unintentional.

## 2018-08-04 ENCOUNTER — Other Ambulatory Visit: Payer: Self-pay | Admitting: Endocrinology

## 2018-08-04 MED ORDER — ROSUVASTATIN CALCIUM 5 MG PO TABS: ORAL_TABLET | ORAL | 3 refills | Status: DC

## 2018-08-04 NOTE — Telephone Encounter (Signed)
Are you okay with this change in medication to suite insurance preferences?

## 2018-08-04 NOTE — Telephone Encounter (Signed)
Change to rosuvastatin 5 mg daily

## 2018-08-04 NOTE — Telephone Encounter (Signed)
Rx sent 

## 2018-10-04 DIAGNOSIS — H1132 Conjunctival hemorrhage, left eye: Secondary | ICD-10-CM | POA: Diagnosis not present

## 2018-10-04 DIAGNOSIS — H25813 Combined forms of age-related cataract, bilateral: Secondary | ICD-10-CM | POA: Diagnosis not present

## 2018-10-04 DIAGNOSIS — E113493 Type 2 diabetes mellitus with severe nonproliferative diabetic retinopathy without macular edema, bilateral: Secondary | ICD-10-CM | POA: Diagnosis not present

## 2018-10-04 DIAGNOSIS — S0012XA Contusion of left eyelid and periocular area, initial encounter: Secondary | ICD-10-CM | POA: Diagnosis not present

## 2018-10-04 DIAGNOSIS — H10413 Chronic giant papillary conjunctivitis, bilateral: Secondary | ICD-10-CM | POA: Diagnosis not present

## 2018-10-29 ENCOUNTER — Encounter (INDEPENDENT_AMBULATORY_CARE_PROVIDER_SITE_OTHER): Payer: Medicare HMO | Admitting: Ophthalmology

## 2018-10-31 ENCOUNTER — Other Ambulatory Visit: Payer: Self-pay | Admitting: Endocrinology

## 2018-11-05 ENCOUNTER — Ambulatory Visit: Payer: Medicare HMO | Admitting: Endocrinology

## 2018-11-11 DIAGNOSIS — H2512 Age-related nuclear cataract, left eye: Secondary | ICD-10-CM | POA: Diagnosis not present

## 2018-11-29 ENCOUNTER — Encounter (INDEPENDENT_AMBULATORY_CARE_PROVIDER_SITE_OTHER): Payer: Medicare HMO | Admitting: Ophthalmology

## 2018-11-29 ENCOUNTER — Other Ambulatory Visit: Payer: Self-pay

## 2018-11-29 DIAGNOSIS — I10 Essential (primary) hypertension: Secondary | ICD-10-CM | POA: Diagnosis not present

## 2018-11-29 DIAGNOSIS — E11311 Type 2 diabetes mellitus with unspecified diabetic retinopathy with macular edema: Secondary | ICD-10-CM

## 2018-11-29 DIAGNOSIS — H43813 Vitreous degeneration, bilateral: Secondary | ICD-10-CM | POA: Diagnosis not present

## 2018-11-29 DIAGNOSIS — E113313 Type 2 diabetes mellitus with moderate nonproliferative diabetic retinopathy with macular edema, bilateral: Secondary | ICD-10-CM | POA: Diagnosis not present

## 2018-11-29 DIAGNOSIS — H35033 Hypertensive retinopathy, bilateral: Secondary | ICD-10-CM | POA: Diagnosis not present

## 2018-12-21 ENCOUNTER — Telehealth (INDEPENDENT_AMBULATORY_CARE_PROVIDER_SITE_OTHER): Payer: Medicare HMO | Admitting: Family Medicine

## 2018-12-21 ENCOUNTER — Encounter: Payer: Self-pay | Admitting: Family Medicine

## 2018-12-21 ENCOUNTER — Other Ambulatory Visit: Payer: Self-pay

## 2018-12-21 DIAGNOSIS — J019 Acute sinusitis, unspecified: Secondary | ICD-10-CM | POA: Diagnosis not present

## 2018-12-21 MED ORDER — AMOXICILLIN-POT CLAVULANATE 875-125 MG PO TABS: 1.0000 | ORAL_TABLET | Freq: Two times a day (BID) | ORAL | 0 refills | Status: DC

## 2018-12-21 NOTE — Progress Notes (Signed)
Pt says his nose has been broken 6x's very hard for him to breathe. Has sleep apnea, told that cpap will not work on him due to all the clogging he has..Suggested tonsils out and adenoid removal. He is taking mucinex in the mornings to help with congestion. He awakes about 20x a night

## 2018-12-21 NOTE — Progress Notes (Signed)
Virtual Visit Note  I connected with [name] on 12/21/18 at 610pm by phone and verified that I am speaking with the correct person using two identifiers. [name] is currently located at home and [name] is currently with them during visit. The provider, Rutherford Guys, MD is located in their office at time of visit.  I discussed the limitations, risks, security and privacy concerns of performing an evaluation and management service by telephone and the availability of in person appointments. I also discussed with the [name] that there may be a [name] responsible charge related to this service. The [name] expressed understanding and agreed to proceed.   CC: sinus infection  HPI ? [name] reports he has severe OSA and rx cpap but was not working well  He saw ENT, Dr Melene Plan, and told that his septum is very deviated, 2 years ago Previous ENTs had wanted to T+A [name] has been exposed to black mold This is really got his sinus going He has been taking mucinex and azelastine for past week or so which has provided partial relief Still having sinus pressure, headaches, thick yellowish nasal drainage Last sinus infection was in early spring  PCP Dr Carlota Raspberry  Allergies  Allergen Reactions  . Cymbalta [Duloxetine Hcl] Nausea And Vomiting  . Darvocet [Propoxyphene N-Acetaminophen] Hives    Prior to Admission medications   Medication Sig Start Date End Date Taking? Authorizing Provider  aspirin 81 MG tablet Take 81 mg by mouth daily.    [provider]  azelastine (ASTELIN) 0.1 % nasal spray PLACE 2 SPRAYS INTO BOTH NOSTRILS 2 (TWO) TIMES DAILY. USE IN [facility] NOSTRIL AS DIRECTED 05/03/16   Tereasa Coop, PA-C  BAYER CONTOUR NEXT TEST test strip CHECK BLOOD SUGAR TWICE DAILY 06/03/16   Elayne Snare, MD  BAYER MICROLET LANCETS lancets Check blood sugar two times daily. 08/27/15   Elayne Snare, MD  Blood Glucose Monitoring Suppl (BAYER CONTOUR NEXT MONITOR) w/Device KIT Check  blood sugar two times daily. 08/27/15   Elayne Snare, MD  fluvastatin XL (LESCOL XL) 80 MG 24 hr tablet Take 1 tablet (80 mg total) by mouth daily. start taking every other day for the first 2 weeks then daily 08/03/18   Elayne Snare, MD  gabapentin (NEURONTIN) 300 MG capsule TAKE 3 CAPSULES (900 MG TOTAL) BY MOUTH 3 (THREE) TIMES DAILY. 05/04/18   Forrest Moron, MD  metFORMIN (GLUCOPHAGE-XR) 500 MG 24 hr tablet TAKE 4 TABLETS (2,000 MG TOTAL) BY MOUTH DAILY WITH SUPPER. 11/01/18   Elayne Snare, MD  omeprazole (PRILOSEC) 20 MG capsule TAKE 1 CAPSULE (20 MG TOTAL) BY MOUTH DAILY. OV NEEDED FOR ADDITIONAL REFILLS 04/30/17   Tereasa Coop, PA-C  rosuvastatin (CRESTOR) 5 MG tablet Take 1 tablet by mouth once daily. 08/04/18   Elayne Snare, MD    Past Medical History:  Diagnosis Date  . Cancer [facility])    Prostate cancer age 50; Lupron treatments followed by prostatectomy.  . Diabetes mellitus without complication ([place])   . GERD (gastroesophageal reflux disease)   . Hyperlipidemia   . Hypertension   . Myocardial infarction [facility])    age 63; no stenting.  . OSA (obstructive sleep apnea)    non-compliant with CPAP.    Past Surgical History:  Procedure Laterality Date  . CHOLECYSTECTOMY    . PROSTATE SURGERY      Social History   Tobacco Use  . Smoking status: Never Smoker  . Smokeless tobacco: Current User  Types: Chew  Substance Use Topics  . Alcohol use: Yes    Comment: social    Family History  Problem Relation Age of Onset  . Cancer Mother        leukemia  . Diabetes Mother   . Cancer Father 29       lung cancer with mets  . Heart disease Maternal Grandmother   . Cancer Maternal Grandfather   . Heart disease Paternal Grandmother   . Cancer Paternal Grandfather     ROS Per hpi  Objective  Vitals as reported by the [name]: none   ASSESSMENT and PLAN  1. Acute non-recurrent sinusitis, unspecified location Discussed supportive measures, new meds r/se/b and RTC  precautions.  Other orders - amoxicillin-clavulanate (AUGMENTIN) 875-125 MG tablet; Take 1 tablet by mouth 2 (two) times daily.  FOLLOW-UP: prn   The above assessment and management plan was discussed with the [name] The [name] verbalized understanding of and has agreed to the management plan. [name] is aware to call the clinic if symptoms persist or worsen. [name] is aware when to return to the clinic for a follow-up visit. [name] educated on when it is appropriate to go to the emergency department.    I provided 9 minutes of non-face-to-face time during this encounter.  Rutherford Guys, MD Primary Care at [facility], [place] [address] Ph.  [phone] Fax [phone]

## 2019-02-20 ENCOUNTER — Other Ambulatory Visit: Payer: Self-pay | Admitting: Endocrinology

## 2019-03-21 ENCOUNTER — Encounter (INDEPENDENT_AMBULATORY_CARE_PROVIDER_SITE_OTHER): Payer: Medicare HMO | Admitting: Ophthalmology

## 2019-03-21 ENCOUNTER — Telehealth: Payer: Self-pay | Admitting: *Deleted

## 2019-03-21 DIAGNOSIS — H43813 Vitreous degeneration, bilateral: Secondary | ICD-10-CM | POA: Diagnosis not present

## 2019-03-21 DIAGNOSIS — H35033 Hypertensive retinopathy, bilateral: Secondary | ICD-10-CM

## 2019-03-21 DIAGNOSIS — I10 Essential (primary) hypertension: Secondary | ICD-10-CM | POA: Diagnosis not present

## 2019-03-21 DIAGNOSIS — E113313 Type 2 diabetes mellitus with moderate nonproliferative diabetic retinopathy with macular edema, bilateral: Secondary | ICD-10-CM

## 2019-03-21 DIAGNOSIS — E11311 Type 2 diabetes mellitus with unspecified diabetic retinopathy with macular edema: Secondary | ICD-10-CM | POA: Diagnosis not present

## 2019-03-21 NOTE — Telephone Encounter (Signed)
Patient needs a welcome to medicare with Doctor

## 2019-03-21 NOTE — Telephone Encounter (Signed)
Attempted to call, no VM available to leave message

## 2019-04-20 ENCOUNTER — Telehealth: Payer: Self-pay | Admitting: Family Medicine

## 2019-04-20 NOTE — Telephone Encounter (Signed)
Called patient to reschedule unable to  Leave Voice Mail not set up . Appt cancelled due to inclimate weather

## 2019-04-21 ENCOUNTER — Ambulatory Visit: Payer: Medicare HMO | Admitting: Family Medicine

## 2019-05-06 ENCOUNTER — Encounter: Payer: Self-pay | Admitting: Family Medicine

## 2019-05-06 ENCOUNTER — Other Ambulatory Visit: Payer: Self-pay

## 2019-05-06 ENCOUNTER — Ambulatory Visit (INDEPENDENT_AMBULATORY_CARE_PROVIDER_SITE_OTHER): Payer: Medicare HMO | Admitting: Family Medicine

## 2019-05-06 VITALS — BP 146/70 | HR 83 | Temp 98.5°F | Resp 18 | Ht 70.0 in | Wt 236.0 lb

## 2019-05-06 DIAGNOSIS — Z1211 Encounter for screening for malignant neoplasm of colon: Secondary | ICD-10-CM | POA: Diagnosis not present

## 2019-05-06 DIAGNOSIS — Z0000 Encounter for general adult medical examination without abnormal findings: Secondary | ICD-10-CM

## 2019-05-06 DIAGNOSIS — E785 Hyperlipidemia, unspecified: Secondary | ICD-10-CM

## 2019-05-06 DIAGNOSIS — E1151 Type 2 diabetes mellitus with diabetic peripheral angiopathy without gangrene: Secondary | ICD-10-CM | POA: Diagnosis not present

## 2019-05-06 DIAGNOSIS — R0789 Other chest pain: Secondary | ICD-10-CM

## 2019-05-06 DIAGNOSIS — E1169 Type 2 diabetes mellitus with other specified complication: Secondary | ICD-10-CM

## 2019-05-06 DIAGNOSIS — K219 Gastro-esophageal reflux disease without esophagitis: Secondary | ICD-10-CM

## 2019-05-06 DIAGNOSIS — Z0001 Encounter for general adult medical examination with abnormal findings: Secondary | ICD-10-CM | POA: Diagnosis not present

## 2019-05-06 DIAGNOSIS — Z8546 Personal history of malignant neoplasm of prostate: Secondary | ICD-10-CM | POA: Diagnosis not present

## 2019-05-06 DIAGNOSIS — I25119 Atherosclerotic heart disease of native coronary artery with unspecified angina pectoris: Secondary | ICD-10-CM

## 2019-05-06 DIAGNOSIS — G4733 Obstructive sleep apnea (adult) (pediatric): Secondary | ICD-10-CM | POA: Diagnosis not present

## 2019-05-06 DIAGNOSIS — E1142 Type 2 diabetes mellitus with diabetic polyneuropathy: Secondary | ICD-10-CM

## 2019-05-06 MED ORDER — OMEPRAZOLE 20 MG PO CPDR: 20.0000 mg | DELAYED_RELEASE_CAPSULE | Freq: Two times a day (BID) | ORAL | 3 refills | Status: DC

## 2019-05-06 NOTE — Patient Instructions (Addendum)
Preventive Care 67 Years and Older, Male Preventive care refers to lifestyle choices and visits with your health care provider that can promote health and wellness. This includes:  A yearly physical exam. This is also called an annual well check.  Regular dental and eye exams.  Immunizations.  Screening for certain conditions.  Healthy lifestyle choices, such as diet and exercise. What can I expect for my preventive care visit? Physical exam Your health care provider will check:  Height and weight. These may be used to calculate body mass index (BMI), which is a measurement that tells if you are at a healthy weight.  Heart rate and blood pressure.  Your skin for abnormal spots. Counseling Your health care provider may ask you questions about:  Alcohol, tobacco, and drug use.  Emotional well-being.  Home and relationship well-being.  Sexual activity.  Eating habits.  History of falls.  Memory and ability to understand (cognition).  Work and work Statistician. What immunizations do I need?  Influenza (flu) vaccine  This is recommended every year. Tetanus, diphtheria, and pertussis (Tdap) vaccine  You may need a Td booster every 10 years. Varicella (chickenpox) vaccine  You may need this vaccine if you have not already been vaccinated. Zoster (shingles) vaccine  You may need this after age 70. Pneumococcal conjugate (PCV13) vaccine  One dose is recommended after age 40. Pneumococcal polysaccharide (PPSV23) vaccine  One dose is recommended after age 24. Measles, mumps, and rubella (MMR) vaccine  You may need at least one dose of MMR if you were born in 1957 or later. You may also need a second dose. Meningococcal conjugate (MenACWY) vaccine  You may need this if you have certain conditions. Hepatitis A vaccine  You may need this if you have certain conditions or if you travel or work in places where you may be exposed to hepatitis A. Hepatitis B  vaccine  You may need this if you have certain conditions or if you travel or work in places where you may be exposed to hepatitis B. Haemophilus influenzae type b (Hib) vaccine  You may need this if you have certain conditions. You may receive vaccines as individual doses or as more than one vaccine together in one shot (combination vaccines). Talk with your health care provider about the risks and benefits of combination vaccines. What tests do I need? Blood tests  Lipid and cholesterol levels. These may be checked every 5 years, or more frequently depending on your overall health.  Hepatitis C test.  Hepatitis B test. Screening  Lung cancer screening. You may have this screening every year starting at age 67 if you have a 30-pack-year history of smoking and currently smoke or have quit within the past 15 years.  Colorectal cancer screening. All adults should have this screening starting at age 77 and continuing until age 8. Your health care provider may recommend screening at age 74 if you are at increased risk. You will have tests every 1-10 years, depending on your results and the type of screening test.  Prostate cancer screening. Recommendations will vary depending on your family history and other risks.  Diabetes screening. This is done by checking your blood sugar (glucose) after you have not eaten for a while (fasting). You may have this done every 1-3 years.  Abdominal aortic aneurysm (AAA) screening. You may need this if you are a current or former smoker.  Sexually transmitted disease (STD) testing. Follow these instructions at home: Eating and drinking  Eat  Eat a diet that includes fresh fruits and vegetables, whole grains, lean protein, and low-fat dairy products. Limit your intake of foods with high amounts of sugar, saturated fats, and salt.  Take vitamin and mineral supplements as recommended by your health care provider.  Do not drink alcohol if your health care  provider tells you not to drink.  If you drink alcohol: ? Limit how much you have to 0-2 drinks a day. ? Be aware of how much alcohol is in your drink. In the [place] one drink equals one 12 oz bottle of beer (355 mL), one 5 oz glass of wine (148 mL), or one 1 oz glass of hard liquor (44 mL). Lifestyle  Take daily care of your teeth and gums.  Stay active. Exercise for at least 30 minutes on 5 or more days each week.  Do not use any products that contain nicotine or tobacco, such as cigarettes, e-cigarettes, and chewing tobacco. If you need help quitting, ask your health care provider.  If you are sexually active, practice safe sex. Use a condom or other form of protection to prevent STIs (sexually transmitted infections).  Talk with your health care provider about taking a low-dose aspirin or statin. What's next?  Visit your health care provider once a year for a well check visit.  Ask your health care provider how often you should have your eyes and teeth checked.  Stay up to date on all vaccines. This information is not intended to replace advice given to you by your health care provider. Make sure you discuss any questions you have with your health care provider. Document Revised: 02/11/2018 Document Reviewed: 02/11/2018 Elsevier Patient Education  2020 [employer].    If you have lab work done today you will be contacted with your lab results within the next 2 weeks.  If you have not heard from us then please contact us. The fastest way to get your results is to register for My Chart.   IF you received an x-ray today, you will receive an invoice from [facility]. Please contact [facility] at [phone] with questions or concerns regarding your invoice.   IF you received labwork today, you will receive an invoice from [facility]. Please contact [facility] at [phone] with questions or concerns regarding your invoice.   Our billing staff will not be able  to assist you with questions regarding bills from these companies.  You will be contacted with the lab results as soon as they are available. The fastest way to get your results is to activate your My Chart account. Instructions are located on the last page of this paperwork. If you have not heard from us regarding the results in 2 weeks, please contact this office.     
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
## 2019-05-07 LAB — LIPID PANEL
Chol/HDL Ratio: 4.7 ratio (ref 0.0–5.0)
Cholesterol, Total: 210 mg/dL — ABNORMAL HIGH (ref 100–199)
HDL: 45 mg/dL (ref >39)
LDL Chol Calc (NIH): 136 mg/dL — ABNORMAL HIGH (ref 0–99)
Triglycerides: 162 mg/dL — ABNORMAL HIGH (ref 0–149)
VLDL Cholesterol Cal: 29 mg/dL (ref 5–40)

## 2019-05-07 LAB — COMPREHENSIVE METABOLIC PANEL
ALT: 49 IU/L — ABNORMAL HIGH (ref 0–44)
AST: 47 IU/L — ABNORMAL HIGH (ref 0–40)
Albumin/Globulin Ratio: 1.9 (ref 1.2–2.2)
Albumin: 4.4 g/dL (ref 3.8–4.8)
Alkaline Phosphatase: 101 IU/L (ref 39–117)
BUN/Creatinine Ratio: 12 (ref 10–24)
BUN: 12 mg/dL (ref 8–27)
Bilirubin Total: 0.4 mg/dL (ref 0.0–1.2)
CO2: 20 mmol/L (ref 20–29)
Calcium: 9.1 mg/dL (ref 8.6–10.2)
Chloride: 103 mmol/L (ref 96–106)
Creatinine, Ser: 1 mg/dL (ref 0.76–1.27)
GFR calc Af Amer: 90 mL/min/{1.73_m2} (ref >59)
GFR calc non Af Amer: 78 mL/min/{1.73_m2} (ref >59)
Globulin, Total: 2.3 g/dL (ref 1.5–4.5)
Glucose: 261 mg/dL — ABNORMAL HIGH (ref 65–99)
Potassium: 4.9 mmol/L (ref 3.5–5.2)
Sodium: 139 mmol/L (ref 134–144)
Total Protein: 6.7 g/dL (ref 6.0–8.5)

## 2019-05-07 LAB — CBC
Hematocrit: 44.9 % (ref 37.5–51.0)
Hemoglobin: 15.3 g/dL (ref 13.0–17.7)
MCH: 31.5 pg (ref 26.6–33.0)
MCHC: 34.1 g/dL (ref 31.5–35.7)
MCV: 93 fL (ref 79–97)
Platelets: 220 10*3/uL (ref 150–450)
RBC: 4.85 x10E6/uL (ref 4.14–5.80)
RDW: 12.7 % (ref 11.6–15.4)
WBC: 6.8 10*3/uL (ref 3.4–10.8)

## 2019-05-07 LAB — HEMOGLOBIN A1C
Est. average glucose Bld gHb Est-mCnc: 232 mg/dL
Hgb A1c MFr Bld: 9.7 % — ABNORMAL HIGH (ref 4.8–5.6)

## 2019-05-07 LAB — PSA: Prostate Specific Ag, Serum: <0.1 ng/mL (ref 0.0–4.0)

## 2019-05-13 ENCOUNTER — Ambulatory Visit: Payer: Medicare HMO | Admitting: Physician Assistant

## 2019-05-13 ENCOUNTER — Ambulatory Visit: Payer: Medicare HMO | Attending: Internal Medicine

## 2019-05-13 DIAGNOSIS — Z23 Encounter for immunization: Secondary | ICD-10-CM

## 2019-05-13 NOTE — Progress Notes (Signed)
   [id number]  Name:  [name]    MRN: [id number] DOB: 1950/07/02  05/13/2019  [name] was observed post Covid-19 immunization for 15 minutes without incident. He was provided with Vaccine Information Sheet and instruction to access the V-Safe system.   [name] was instructed to call 911 with any severe reactions post vaccine: Marland Kitchen Difficulty breathing  . Swelling of face and throat  . A fast heartbeat  . A bad rash all over body  . Dizziness and weakness   Immunizations Administered    Name Date Dose VIS Date Route   Moderna COVID-19 Vaccine 05/13/2019  8:44 AM 0.5 mL 02/01/2019 Intramuscular   Manufacturer: Moderna   Lot: [id number]   [id number]

## 2019-05-23 ENCOUNTER — Encounter: Payer: Self-pay | Admitting: General Practice

## 2019-05-26 ENCOUNTER — Encounter: Payer: Self-pay | Admitting: Radiology

## 2019-05-26 NOTE — Progress Notes (Signed)
Please send him a letter then. thanks

## 2019-05-26 NOTE — Progress Notes (Signed)
Spoke with pt, he has appt with endo nx week

## 2019-05-31 ENCOUNTER — Other Ambulatory Visit: Payer: Self-pay

## 2019-06-02 ENCOUNTER — Other Ambulatory Visit: Payer: Self-pay

## 2019-06-02 ENCOUNTER — Ambulatory Visit: Payer: Medicare HMO | Admitting: Endocrinology

## 2019-06-02 ENCOUNTER — Encounter: Payer: Self-pay | Admitting: Endocrinology

## 2019-06-02 ENCOUNTER — Other Ambulatory Visit: Payer: Self-pay | Admitting: Endocrinology

## 2019-06-02 VITALS — BP 140/70 | HR 77 | Ht 70.0 in | Wt 236.4 lb

## 2019-06-02 DIAGNOSIS — E1142 Type 2 diabetes mellitus with diabetic polyneuropathy: Secondary | ICD-10-CM | POA: Diagnosis not present

## 2019-06-02 DIAGNOSIS — E1140 Type 2 diabetes mellitus with diabetic neuropathy, unspecified: Secondary | ICD-10-CM | POA: Diagnosis not present

## 2019-06-02 DIAGNOSIS — E782 Mixed hyperlipidemia: Secondary | ICD-10-CM

## 2019-06-02 MED ORDER — FLUVASTATIN SODIUM ER 80 MG PO TB24: 80.0000 mg | ORAL_TABLET | Freq: Every day | ORAL | 3 refills | Status: DC

## 2019-06-02 MED ORDER — GLIMEPIRIDE 2 MG PO TABS: 2.0000 mg | ORAL_TABLET | Freq: Every day | ORAL | 1 refills | Status: DC

## 2019-06-02 NOTE — Patient Instructions (Addendum)
Check blood sugars on waking up 2-3 days a week  Also check blood sugars about 2 hours after meals and do this after different meals by rotation  Recommended blood sugar levels on waking up are 90-130 and about 2 hours after meal is 130-160  Please bring your blood sugar monitor to each visit, thank you  No sweet tea

## 2019-06-02 NOTE — Progress Notes (Signed)
14

## 2019-06-02 NOTE — Telephone Encounter (Signed)
Do you have an alternative medication?

## 2019-06-02 NOTE — Telephone Encounter (Signed)
I sent prescription for fluvastatin.  It is not covered?

## 2019-06-02 NOTE — Progress Notes (Signed)
Patient ID: [name], male   DOB: Oct 25, 1950, 69 y.o.   MRN: [id number]           Reason for Appointment: Follow-up for Type 2 Diabetes   History of Present Illness:          Date of diagnosis of type 2 diabetes mellitus:?  2002       Background history:   He thinks he had increased thirst at the time of diagnosis which was probably 12-14 years ago He was treated with metformin for several years Subsequently Amaryl was added and this has been continued Previous level of control is unknown, records available only since 2013  He previously had poorly controlled diabetes and A1c consistently over 9%  Recent history:     He has not been seen in follow-up since 6/20  His A1c is now 9.7  Non-insulin hypoglycemic drugs the patient is taking are: Metformin 1 g a day   Current management, blood sugar patterns and problems identified:  He has not been keeping his appointments as directed  Not clear if his home blood sugar test strips are accurate as he checks them fairly infrequently  However his readings range between 200-245  He says that he has not been watching his diet at all and also drinking regular sweet tea  Except for just recently has been fairly inactive also  Has gained weight also  He thinks he is taking his Metformin regularly but not always 4 tablets a day of the extended release  Previously had not wanted to use brand-name medication because of cost  Lab glucose was 261        Side effects from medications have been: None  Compliance with the medical regimen: Fair   Glucose monitoring:  As above      Glucometer: Contour Blood Glucose readings as above    Meal times are:  Breakfast is usually small or missed  Typical meal intake: Meat and 2 vegetables at lunch, snacks or fruits at dinner               Dietician visit, most recent: Never                Weight history:  Wt Readings from Last 3 Encounters:  06/02/19 236 lb 6.4 oz (107.2  kg)  05/06/19 236 lb (107 kg)  08/03/18 229 lb 12.8 oz (104.2 kg)    Glycemic control:   Lab Results  Component Value Date   HGBA1C 9.7 (H) 05/06/2019   HGBA1C 6.7 (A) 08/03/2018   HGBA1C 7.5 (H) 06/03/2017   Lab Results  Component Value Date   MICROALBUR 5.1 (H) 08/03/2018   LDLCALC 136 (H) 05/06/2019   CREATININE 1.00 05/06/2019   Lab Results  Component Value Date   MICRALBCREAT 2.9 08/03/2018       Allergies as of 06/02/2019      Reactions   Cymbalta [duloxetine Hcl] Nausea And Vomiting   Darvocet [propoxyphene N-acetaminophen] Hives      Medication List       Accurate as of June 02, 2019  3:12 PM. If you have any questions, ask your nurse or doctor.        STOP taking these medications   fluvastatin XL 80 MG 24 hr tablet Commonly known as: LESCOL XL Stopped by: Elayne Snare, MD   rosuvastatin 5 MG tablet Commonly known as: CRESTOR Stopped by: Elayne Snare, MD     TAKE these medications   aspirin 81  MG tablet Take 81 mg by mouth daily.   azelastine 0.1 % nasal spray Commonly known as: ASTELIN PLACE 2 SPRAYS INTO BOTH NOSTRILS 2 (TWO) TIMES DAILY. USE IN EACH NOSTRIL AS DIRECTED   Bayer Contour Next Monitor w/Device Kit Check blood sugar two times daily.   Bayer Contour Next Test test strip Generic drug: glucose blood CHECK BLOOD SUGAR TWICE DAILY   Bayer Microlet Lancets lancets Check blood sugar two times daily.   gabapentin 300 MG capsule Commonly known as: NEURONTIN TAKE 3 CAPSULES (900 MG TOTAL) BY MOUTH 3 (THREE) TIMES DAILY.   glimepiride 2 MG tablet Commonly known as: AMARYL Take 1 tablet (2 mg total) by mouth daily before supper. Started by: Elayne Snare, MD   metFORMIN 500 MG 24 hr tablet Commonly known as: GLUCOPHAGE-XR TAKE 4 TABLETS (2,000 MG TOTAL) BY MOUTH DAILY WITH SUPPER.   omeprazole 20 MG capsule Commonly known as: PRILOSEC Take 1 capsule (20 mg total) by mouth 2 (two) times daily before a meal.       Allergies:    Allergies  Allergen Reactions  . Cymbalta [Duloxetine Hcl] Nausea And Vomiting  . Darvocet [Propoxyphene N-Acetaminophen] Hives    Past Medical History:  Diagnosis Date  . Cancer [name])    Prostate cancer age 66; Lupron treatments followed by prostatectomy.  . Diabetes mellitus without complication ([place])   . GERD (gastroesophageal reflux disease)   . Hyperlipidemia   . Hypertension   . Myocardial infarction [facility])    age 83; no stenting.  . OSA (obstructive sleep apnea)    non-compliant with CPAP.    Past Surgical History:  Procedure Laterality Date  . CHOLECYSTECTOMY    . PROSTATE SURGERY      Family History  Problem Relation Age of Onset  . Cancer Mother        leukemia  . Diabetes Mother   . Cancer Father 65       lung cancer with mets  . Heart disease Maternal Grandmother   . Cancer Maternal Grandfather   . Heart disease Paternal Grandmother   . Cancer Paternal Grandfather     Social History:  reports that he has never smoked. His smokeless tobacco use includes chew. He reports current alcohol use. He reports that he does not use drugs.   Review of Systems   Lipid history: Has not been taking his statin drug, has been on various treatments, most recently was taking fluvastatin  He thinks he gets weakness from statin drugs but probably not with Lescol but has not had a prescription for some time   Lab Results  Component Value Date   CHOL 210 (H) 05/06/2019   HDL 45 05/06/2019   LDLCALC 136 (H) 05/06/2019   LDLDIRECT 112.0 08/03/2018   TRIG 162 (H) 05/06/2019   CHOLHDL 4.7 05/06/2019           NEUROPATHY: He was taking 900 mg tid gabapentin for symptoms at night of pain and burning, less recently   Followed by ophthalmologist, has been told to have severe retinopathy and probably also macular edema   He is followed by [facility]  Physical Examination:  BP 140/70 (BP Location: Left Arm, Patient Position: Sitting, Cuff Size: Normal)   Pulse 77    Ht '5\' 10"'  (1.778 m)   Wt 236 lb 6.4 oz (107.2 kg)   SpO2 98%   BMI 33.92 kg/m      ASSESSMENT:  Diabetes type 2, recent BMI 32  See history  of present illness for detailed discussion of current diabetes management, blood sugar patterns and problems identified  His A1c is 9.7 compared to 6.7  Most of his high sugars are related to poor diet, inactivity, weight gain, not taking his medications consistently and stress Blood sugars appear to be mostly over 200 at home   NEUROPATHY: Less symptomatic recently  HYPERLIPIDEMIA: He has not been on any statin drug recently and LDL is higher again    PLAN:    To take all 4 tablets of metformin ER at dinnertime  Reassured him that this is safe  Start Amaryl 2 mg at dinnertime  He agrees to improve his diet with healthier choices, avoiding all sugar-containing drinks  He needs to start walking or other activities for exercise  Consider adding Invokana on the next visit if not improved  More consistent follow-up  Check blood sugars by rotation at different times daily after dinner  Restart taking Lescol XL 80 mg, can try every other day to start with Consultation with dietitian for better meal planning and general diabetes education   Patient Instructions  Check blood sugars on waking up 2-3 days a week  Also check blood sugars about 2 hours after meals and do this after different meals by rotation  Recommended blood sugar levels on waking up are 90-130 and about 2 hours after meal is 130-160  Please bring your blood sugar monitor to each visit, thank you  No sweet tea         Elayne Snare 06/02/2019, 3:12 PM   Note: This office note was prepared with Dragon voice recognition system technology. Any transcriptional errors that result from this process are unintentional.

## 2019-06-02 NOTE — Telephone Encounter (Signed)
Pharmacy cannot change the prescription.  They need to fill fluvastatin which he had taken before

## 2019-06-02 NOTE — Telephone Encounter (Signed)
Refill request indicates that Fluvastatin was changed to Mevacor and would like approval for this instead of Fluvastatin.

## 2019-06-06 ENCOUNTER — Other Ambulatory Visit: Payer: Self-pay | Admitting: Endocrinology

## 2019-06-07 ENCOUNTER — Telehealth: Payer: Self-pay

## 2019-06-07 NOTE — Telephone Encounter (Signed)
See PA note.

## 2019-06-07 NOTE — Telephone Encounter (Signed)
Outcome: Approved: today PA Case: [id number]  Status: Approved  Coverage Starts on: 03/04/2019 12:00:00 AM  Coverage Ends on: 03/02/2020 12:00:00 AM.  Questions? Contact [phone].

## 2019-06-07 NOTE — Telephone Encounter (Signed)
PA initiated via [email or web address] for Fluvastatin.  Jae Dire Key: [id number]  PA Case ID: [id number] Need help? Call us at [phone] Status: Sent to [facility] Drug: Fluvastatin Sodium ER 80MG  er tablets Form: Administrator, sports PA Form

## 2019-06-08 ENCOUNTER — Telehealth: Payer: Self-pay

## 2019-06-08 NOTE — Telephone Encounter (Signed)
error 

## 2019-06-15 ENCOUNTER — Ambulatory Visit: Payer: Medicare HMO | Attending: Internal Medicine

## 2019-06-15 DIAGNOSIS — Z23 Encounter for immunization: Secondary | ICD-10-CM

## 2019-06-15 NOTE — Progress Notes (Signed)
   [id number]  Name:  [name]    MRN: [id number] DOB: 21-Apr-1950  06/15/2019  Mr. [name] was observed post Covid-19 immunization for 15 minutes without incident. He was provided with Vaccine Information Sheet and instruction to access the V-Safe system.   Mr. [name] was instructed to call 911 with any severe reactions post vaccine: Marland Kitchen Difficulty breathing  . Swelling of face and throat  . A fast heartbeat  . A bad rash all over body  . Dizziness and weakness   Immunizations Administered    Name Date Dose VIS Date Route   Moderna COVID-19 Vaccine 06/15/2019  8:44 AM 0.5 mL 02/01/2019 Intramuscular   Manufacturer: Moderna   Lot: [id number]   [id number]

## 2019-06-25 ENCOUNTER — Other Ambulatory Visit: Payer: Self-pay | Admitting: Endocrinology

## 2019-07-05 ENCOUNTER — Other Ambulatory Visit: Payer: Self-pay | Admitting: Family Medicine

## 2019-07-05 DIAGNOSIS — G8929 Other chronic pain: Secondary | ICD-10-CM

## 2019-07-05 NOTE — Telephone Encounter (Signed)
Requested Prescriptions  Pending Prescriptions Disp Refills  . gabapentin (NEURONTIN) 300 MG capsule [Pharmacy Med Name: GABAPENTIN 300 MG CAPSULE] 270 capsule 0    Sig: TAKE 3 CAPSULES (900 MG TOTAL) BY MOUTH 3 (THREE) TIMES DAILY.     Neurology: Anticonvulsants - gabapentin Passed - 07/05/2019 11:06 AM      Passed - Valid encounter within last 12 months    Recent Outpatient Visits          2 months ago Welcome to [employer] preventive visit   [facility], Lilia Argue, MD   6 months ago Acute non-recurrent sinusitis, unspecified location   [facility], Lilia Argue, MD   1 year ago Acute sinusitis, recurrence not specified, unspecified location   [facility], Ranell Patrick, MD   1 year ago Insect bite of left foot, subsequent encounter   Primary Care at [facility], Ines Bloomer, MD   1 year ago Insect bite of left foot, subsequent encounter   Primary [facility] at [facility], Renette Butters, MD      Future Appointments            In 1 month Rutherford Guys, MD Primary Care at [place], [facility]

## 2019-07-11 ENCOUNTER — Ambulatory Visit: Payer: Medicare HMO | Admitting: Dietician

## 2019-07-15 ENCOUNTER — Other Ambulatory Visit: Payer: Self-pay

## 2019-07-15 ENCOUNTER — Encounter (INDEPENDENT_AMBULATORY_CARE_PROVIDER_SITE_OTHER): Payer: Medicare HMO | Admitting: Ophthalmology

## 2019-07-15 DIAGNOSIS — H35033 Hypertensive retinopathy, bilateral: Secondary | ICD-10-CM

## 2019-07-15 DIAGNOSIS — H43813 Vitreous degeneration, bilateral: Secondary | ICD-10-CM

## 2019-07-15 DIAGNOSIS — E11319 Type 2 diabetes mellitus with unspecified diabetic retinopathy without macular edema: Secondary | ICD-10-CM

## 2019-07-15 DIAGNOSIS — I10 Essential (primary) hypertension: Secondary | ICD-10-CM | POA: Diagnosis not present

## 2019-07-15 DIAGNOSIS — E113393 Type 2 diabetes mellitus with moderate nonproliferative diabetic retinopathy without macular edema, bilateral: Secondary | ICD-10-CM | POA: Diagnosis not present

## 2019-07-19 ENCOUNTER — Other Ambulatory Visit: Payer: Medicare HMO

## 2019-07-22 ENCOUNTER — Ambulatory Visit: Payer: Medicare HMO | Admitting: Endocrinology

## 2019-07-27 ENCOUNTER — Other Ambulatory Visit: Payer: Self-pay | Admitting: Endocrinology

## 2019-07-27 ENCOUNTER — Other Ambulatory Visit: Payer: Self-pay | Admitting: Family Medicine

## 2019-07-27 DIAGNOSIS — G8929 Other chronic pain: Secondary | ICD-10-CM

## 2019-07-27 NOTE — Telephone Encounter (Signed)
Is it ok to refill this rx with this many capsules? I was thinking this was a little much but I wanted to check with you to be sure

## 2019-07-31 ENCOUNTER — Other Ambulatory Visit: Payer: Self-pay | Admitting: Family Medicine

## 2019-07-31 DIAGNOSIS — K219 Gastro-esophageal reflux disease without esophagitis: Secondary | ICD-10-CM

## 2019-07-31 NOTE — Telephone Encounter (Signed)
Requested Prescriptions  Pending Prescriptions Disp Refills  . omeprazole (PRILOSEC) 20 MG capsule [Pharmacy Med Name: OMEPRAZOLE DR 20 MG CAPSULE] 180 capsule 1    Sig: TAKE 1 CAPSULE (20 MG TOTAL) BY MOUTH 2 (TWO) TIMES DAILY BEFORE A MEAL.     Gastroenterology: Proton Pump Inhibitors Passed - 07/31/2019 12:45 AM      Passed - Valid encounter within last 12 months    Recent Outpatient Visits          2 months ago Welcome to [facility] preventive visit   [facility], Lilia Argue, MD   7 months ago Acute non-recurrent sinusitis, unspecified location   [facility] at [facility], Lilia Argue, MD   1 year ago Acute sinusitis, recurrence not specified, unspecified location   [facility] at [facility], Ranell Patrick, MD   1 year ago Insect bite of left foot, subsequent encounter   Primary Care at [facility], Ines Bloomer, MD   1 year ago Insect bite of left foot, subsequent encounter   Primary Care at [facility], Renette Butters, MD      Future Appointments            In 1 week Rutherford Guys, MD Primary Care at [place], [facility]

## 2019-08-08 ENCOUNTER — Ambulatory Visit: Payer: Medicare HMO | Admitting: Family Medicine

## 2019-08-09 ENCOUNTER — Encounter: Payer: Self-pay | Admitting: Family Medicine

## 2019-08-09 ENCOUNTER — Other Ambulatory Visit: Payer: Self-pay | Admitting: Endocrinology

## 2019-09-08 ENCOUNTER — Other Ambulatory Visit: Payer: Self-pay | Admitting: Endocrinology

## 2019-10-21 ENCOUNTER — Encounter (INDEPENDENT_AMBULATORY_CARE_PROVIDER_SITE_OTHER): Payer: Medicare HMO | Admitting: Ophthalmology

## 2019-10-26 ENCOUNTER — Encounter (INDEPENDENT_AMBULATORY_CARE_PROVIDER_SITE_OTHER): Payer: Medicare HMO | Admitting: Ophthalmology

## 2019-10-26 ENCOUNTER — Other Ambulatory Visit: Payer: Self-pay

## 2019-10-26 DIAGNOSIS — H35033 Hypertensive retinopathy, bilateral: Secondary | ICD-10-CM | POA: Diagnosis not present

## 2019-10-26 DIAGNOSIS — E11319 Type 2 diabetes mellitus with unspecified diabetic retinopathy without macular edema: Secondary | ICD-10-CM

## 2019-10-26 DIAGNOSIS — I10 Essential (primary) hypertension: Secondary | ICD-10-CM | POA: Diagnosis not present

## 2019-10-26 DIAGNOSIS — H43813 Vitreous degeneration, bilateral: Secondary | ICD-10-CM

## 2019-10-26 DIAGNOSIS — E113393 Type 2 diabetes mellitus with moderate nonproliferative diabetic retinopathy without macular edema, bilateral: Secondary | ICD-10-CM

## 2019-12-02 ENCOUNTER — Other Ambulatory Visit: Payer: Self-pay | Admitting: Endocrinology

## 2019-12-02 MED ORDER — METFORMIN HCL ER 500 MG PO TB24: 2000.0000 mg | ORAL_TABLET | Freq: Every day | ORAL | 0 refills | Status: DC

## 2019-12-02 NOTE — Addendum Note (Signed)
Addended by: Amado Coe on: 12/02/2019 09:35 AM   Modules accepted: Orders

## 2020-01-10 ENCOUNTER — Telehealth: Payer: Self-pay | Admitting: Endocrinology

## 2020-01-10 NOTE — Telephone Encounter (Signed)
-----   Message from Elayne Snare, MD sent at 12/27/2019  3:03 PM EDT ----- Regarding: Appointment Overdue for follow-up, please schedule, labs same day if he cannot come twice

## 2020-01-10 NOTE — Telephone Encounter (Signed)
Completed - scheduled for 01/16/20

## 2020-01-16 ENCOUNTER — Ambulatory Visit: Payer: Medicare HMO | Admitting: Endocrinology

## 2020-02-15 ENCOUNTER — Other Ambulatory Visit: Payer: Self-pay

## 2020-02-15 ENCOUNTER — Encounter (INDEPENDENT_AMBULATORY_CARE_PROVIDER_SITE_OTHER): Payer: Medicare HMO | Admitting: Ophthalmology

## 2020-02-15 DIAGNOSIS — H43813 Vitreous degeneration, bilateral: Secondary | ICD-10-CM

## 2020-02-15 DIAGNOSIS — E113392 Type 2 diabetes mellitus with moderate nonproliferative diabetic retinopathy without macular edema, left eye: Secondary | ICD-10-CM

## 2020-02-15 DIAGNOSIS — E11311 Type 2 diabetes mellitus with unspecified diabetic retinopathy with macular edema: Secondary | ICD-10-CM | POA: Diagnosis not present

## 2020-02-15 DIAGNOSIS — I10 Essential (primary) hypertension: Secondary | ICD-10-CM | POA: Diagnosis not present

## 2020-02-15 DIAGNOSIS — H35033 Hypertensive retinopathy, bilateral: Secondary | ICD-10-CM | POA: Diagnosis not present

## 2020-02-15 DIAGNOSIS — E113311 Type 2 diabetes mellitus with moderate nonproliferative diabetic retinopathy with macular edema, right eye: Secondary | ICD-10-CM | POA: Diagnosis not present

## 2020-03-02 ENCOUNTER — Other Ambulatory Visit: Payer: Self-pay | Admitting: Endocrinology

## 2020-03-28 ENCOUNTER — Other Ambulatory Visit: Payer: Self-pay | Admitting: Endocrinology

## 2020-06-06 ENCOUNTER — Encounter (INDEPENDENT_AMBULATORY_CARE_PROVIDER_SITE_OTHER): Payer: Medicare HMO | Admitting: Ophthalmology

## 2020-06-15 ENCOUNTER — Other Ambulatory Visit: Payer: Self-pay

## 2020-06-15 ENCOUNTER — Encounter (INDEPENDENT_AMBULATORY_CARE_PROVIDER_SITE_OTHER): Payer: Medicare HMO | Admitting: Ophthalmology

## 2020-06-15 DIAGNOSIS — I10 Essential (primary) hypertension: Secondary | ICD-10-CM | POA: Diagnosis not present

## 2020-06-15 DIAGNOSIS — H35033 Hypertensive retinopathy, bilateral: Secondary | ICD-10-CM

## 2020-06-15 DIAGNOSIS — H43813 Vitreous degeneration, bilateral: Secondary | ICD-10-CM

## 2020-06-15 DIAGNOSIS — E113311 Type 2 diabetes mellitus with moderate nonproliferative diabetic retinopathy with macular edema, right eye: Secondary | ICD-10-CM | POA: Diagnosis not present

## 2020-06-15 DIAGNOSIS — E113392 Type 2 diabetes mellitus with moderate nonproliferative diabetic retinopathy without macular edema, left eye: Secondary | ICD-10-CM

## 2020-07-19 ENCOUNTER — Ambulatory Visit (INDEPENDENT_AMBULATORY_CARE_PROVIDER_SITE_OTHER): Payer: Medicare HMO | Admitting: Nurse Practitioner

## 2020-07-19 VITALS — BP 117/82 | HR 68 | Temp 97.9°F | Resp 18

## 2020-07-19 DIAGNOSIS — G8929 Other chronic pain: Secondary | ICD-10-CM | POA: Insufficient documentation

## 2020-07-19 DIAGNOSIS — E1142 Type 2 diabetes mellitus with diabetic polyneuropathy: Secondary | ICD-10-CM

## 2020-07-19 MED ORDER — GABAPENTIN 300 MG PO CAPS: 900.0000 mg | ORAL_CAPSULE | Freq: Two times a day (BID) | ORAL | 0 refills | Status: DC

## 2020-07-19 MED ORDER — METFORMIN HCL ER 500 MG PO TB24: ORAL_TABLET | ORAL | 0 refills | Status: DC

## 2020-07-19 NOTE — Assessment & Plan Note (Signed)
Neuropathy:  Please keep upcoming appointment with [name]  Will need diabetic foot exam  Will need lab work  Diabetic diet  Will refill medications until upcoming appointment with [name] - will need to take medications to upcoming appointment - will need education about prescriptions  History of Asbestos exposure - will need referral to pulmonary   Follow up:   If needed

## 2020-07-19 NOTE — Patient Instructions (Signed)
Diabetes Neuropathy:  Please keep upcoming appointment with [name]  Will need diabetic foot exam  Will need lab work  Diabetic diet  Will refill medications until upcoming appointment with [name] - will need to take medications to upcoming appointment - will need education about prescriptions  History of Asbestos exposure - will need referral to pulmonary   Follow up:   If needed    Diabetes Mellitus and Nutrition, Adult When you have diabetes, or diabetes mellitus, it is very important to have healthy eating habits because your blood sugar (glucose) levels are greatly affected by what you eat and drink. Eating healthy foods in the right amounts, at about the same times every day, can help you:  Control your blood glucose.  Lower your risk of heart disease.  Improve your blood pressure.  Reach or maintain a healthy weight. What can affect my meal plan? Every person with diabetes is different, and each person has different needs for a meal plan. Your health care provider may recommend that you work with a dietitian to make a meal plan that is best for you. Your meal plan may vary depending on factors such as:  The calories you need.  The medicines you take.  Your weight.  Your blood glucose, blood pressure, and cholesterol levels.  Your activity level.  Other health conditions you have, such as heart or kidney disease. How do carbohydrates affect me? Carbohydrates, also called carbs, affect your blood glucose level more than any other type of food. Eating carbs naturally raises the amount of glucose in your blood. Carb counting is a method for keeping track of how many carbs you eat. Counting carbs is important to keep your blood glucose at a healthy level, especially if you use insulin or take certain oral diabetes medicines. It is important to know how many carbs you can safely have in each meal. This is different for every person. Your dietitian can help you  calculate how many carbs you should have at each meal and for each snack. How does alcohol affect me? Alcohol can cause a sudden decrease in blood glucose (hypoglycemia), especially if you use insulin or take certain oral diabetes medicines. Hypoglycemia can be a life-threatening condition. Symptoms of hypoglycemia, such as sleepiness, dizziness, and confusion, are similar to symptoms of having too much alcohol.  Do not drink alcohol if: ? Your health care provider tells you not to drink. ? You are pregnant, may be pregnant, or are planning to become pregnant.  If you drink alcohol: ? Do not drink on an empty stomach. ? Limit how much you use to:  0-1 drink a day for women.  0-2 drinks a day for men. ? Be aware of how much alcohol is in your drink. In the [place] one drink equals one 12 oz bottle of beer (355 mL), one 5 oz glass of wine (148 mL), or one 1 oz glass of hard liquor (44 mL). ? Keep yourself hydrated with water, diet soda, or unsweetened iced tea.  Keep in mind that regular soda, juice, and other mixers may contain a lot of sugar and must be counted as carbs. What are tips for following this plan? Reading food labels  Start by checking the serving size on the "Nutrition Facts" label of packaged foods and drinks. The amount of calories, carbs, fats, and other nutrients listed on the label is based on one serving of the item. Many items contain more than one serving per package.  Check  the total grams (g) of carbs in one serving. You can calculate the number of servings of carbs in one serving by dividing the total carbs by 15. For example, if a food has 30 g of total carbs per serving, it would be equal to 2 servings of carbs.  Check the number of grams (g) of saturated fats and trans fats in one serving. Choose foods that have a low amount or none of these fats.  Check the number of milligrams (mg) of salt (sodium) in one serving. Most people should limit total sodium intake to  less than 2,300 mg per day.  Always check the nutrition information of foods labeled as "low-fat" or "nonfat." These foods may be higher in added sugar or refined carbs and should be avoided.  Talk to your dietitian to identify your daily goals for nutrients listed on the label. Shopping  Avoid buying canned, pre-made, or processed foods. These foods tend to be high in fat, sodium, and added sugar.  Shop around the outside edge of the grocery store. This is where you will most often find fresh fruits and vegetables, bulk grains, fresh meats, and fresh dairy. Cooking  Use low-heat cooking methods, such as baking, instead of high-heat cooking methods like deep frying.  Cook using healthy oils, such as olive, canola, or sunflower oil.  Avoid cooking with butter, cream, or high-fat meats. Meal planning  Eat meals and snacks regularly, preferably at the same times every day. Avoid going long periods of time without eating.  Eat foods that are high in fiber, such as fresh fruits, vegetables, beans, and whole grains. Talk with your dietitian about how many servings of carbs you can eat at each meal.  Eat 4-6 oz (112-168 g) of lean protein each day, such as lean meat, chicken, fish, eggs, or tofu. One ounce (oz) of lean protein is equal to: ? 1 oz (28 g) of meat, chicken, or fish. ? 1 egg. ?  cup (62 g) of tofu.  Eat some foods each day that contain healthy fats, such as avocado, nuts, seeds, and fish.   What foods should I eat? Fruits Berries. Apples. Oranges. Peaches. Apricots. Plums. Grapes. Mango. Papaya. Pomegranate. Kiwi. Cherries. Vegetables Lettuce. Spinach. Leafy greens, including kale, chard, collard greens, and mustard greens. Beets. Cauliflower. Cabbage. Broccoli. Carrots. Green beans. Tomatoes. Peppers. Onions. Cucumbers. Brussels sprouts. Grains Whole grains, such as whole-wheat or whole-grain bread, crackers, tortillas, cereal, and pasta. Unsweetened oatmeal. Quinoa. Brown  or wild rice. Meats and other proteins Seafood. Poultry without skin. Lean cuts of poultry and beef. Tofu. Nuts. Seeds. Dairy Low-fat or fat-free dairy products such as milk, yogurt, and cheese. The items listed above may not be a complete list of foods and beverages you can eat. Contact a dietitian for more information. What foods should I avoid? Fruits Fruits canned with syrup. Vegetables Canned vegetables. Frozen vegetables with butter or cream sauce. Grains Refined white flour and flour products such as bread, pasta, snack foods, and cereals. Avoid all processed foods. Meats and other proteins Fatty cuts of meat. Poultry with skin. Breaded or fried meats. Processed meat. Avoid saturated fats. Dairy Full-fat yogurt, cheese, or milk. Beverages Sweetened drinks, such as soda or iced tea. The items listed above may not be a complete list of foods and beverages you should avoid. Contact a dietitian for more information. Questions to ask a health care provider  Do I need to meet with a diabetes educator?  Do I need to meet  with a dietitian?  What number can I call if I have questions?  When are the best times to check my blood glucose? Where to find more information:  American Diabetes Association: diabetes.org  Academy of Nutrition and Dietetics: www.eatright.[employer] of Diabetes and Digestive and Kidney Diseases: [employer]  Association of Diabetes Care and Education Specialists: www.diabeteseducator.org Summary  It is important to have healthy eating habits because your blood sugar (glucose) levels are greatly affected by what you eat and drink.  A healthy meal plan will help you control your blood glucose and maintain a healthy lifestyle.  Your health care provider may recommend that you work with a dietitian to make a meal plan that is best for you.  Keep in mind that carbohydrates (carbs) and alcohol have immediate effects on your blood glucose  levels. It is important to count carbs and to use alcohol carefully. This information is not intended to replace advice given to you by your health care provider. Make sure you discuss any questions you have with your health care provider. Document Revised: 01/25/2019 Document Reviewed: 01/25/2019 Elsevier Patient Education  2021 [employer].

## 2020-07-19 NOTE — Progress Notes (Signed)
'@Patient'  ID: [name], male    DOB: [birth year], 70 y.o.   MRN: [id number]  Chief Complaint  Patient presents with  . Diabetes  . Medication Refill    Referring provider: No ref. provider found  HPI  Patient presents today for medication refill.  Patient's current PCPs office recently closed.  Patient does have an appointment set up to establish care with primary care provider in June.  He needs refills on his metformin and Neurontin today to make it through to that appointment.  Patient does need education on how to take his medication.  He states that he is taking it differently than what is prescribed.  He does need repeat blood work for diabetic management.  He also needs a diabetic foot exam.  We discussed that he will have these completed at his upcoming visit with his primary care physician.  Vital signs in office today are stable.  Patient states that he has been checking his blood sugars at home and they are ranging between 148-168.  Denies f/c/s, n/v/d, hemoptysis, PND,chest pain or edema.       Allergies  Allergen Reactions  . Cymbalta [Duloxetine Hcl] Nausea And Vomiting  . Darvocet [Propoxyphene N-Acetaminophen] Hives    Immunization History  Administered Date(s) Administered  . Influenza, High Dose Seasonal PF 11/24/2019  . Influenza,inj,Quad PF,6+ Mos 11/04/2016, 12/27/2017, 11/09/2018  . Influenza-Unspecified 11/16/2015, 12/27/2017, 12/27/2017  . Moderna Sars-Covid-2 Vaccination 05/13/2019, 06/15/2019, 02/23/2020  . Pneumococcal Polysaccharide-23 08/04/2015, 11/09/2018  . Td 03/07/1995  . Tdap 09/26/2012, 07/14/2017    Past Medical History:  Diagnosis Date  . Cancer [facility])    Prostate cancer age 62; Lupron treatments followed by prostatectomy.  . Diabetes mellitus without complication ([place])   . GERD (gastroesophageal reflux disease)   . Hyperlipidemia   . Hypertension   . Myocardial infarction [facility])    age 37; no stenting.  . OSA (obstructive  sleep apnea)    non-compliant with CPAP.    Tobacco History: Social History   Tobacco Use  Smoking Status Never Smoker  Smokeless Tobacco Current User  . Types: Chew   Ready to quit: No Counseling given: Yes   Outpatient Encounter Medications as of 07/19/2020  Medication Sig  . aspirin 81 MG tablet Take 81 mg by mouth daily.  Marland Kitchen azelastine (ASTELIN) 0.1 % nasal spray PLACE 2 SPRAYS INTO BOTH NOSTRILS 2 (TWO) TIMES DAILY. USE IN EACH NOSTRIL AS DIRECTED  . BAYER CONTOUR NEXT TEST test strip CHECK BLOOD SUGAR TWICE DAILY  . BAYER MICROLET LANCETS lancets Check blood sugar two times daily.  . Blood Glucose Monitoring Suppl (BAYER CONTOUR NEXT MONITOR) w/Device KIT Check blood sugar two times daily.  . fluvastatin XL (LESCOL XL) 80 MG 24 hr tablet Take 1 tablet (80 mg total) by mouth daily. start taking every other day for the first 2 weeks then daily  . gabapentin (NEURONTIN) 300 MG capsule Take 3 capsules (900 mg total) by mouth 2 (two) times daily.  . metFORMIN (GLUCOPHAGE-XR) 500 MG 24 hr tablet 4 tablets daily  . omeprazole (PRILOSEC) 20 MG capsule TAKE 1 CAPSULE (20 MG TOTAL) BY MOUTH 2 (TWO) TIMES DAILY BEFORE A MEAL.  . [DISCONTINUED] gabapentin (NEURONTIN) 300 MG capsule TAKE 3 CAPSULES (900 MG TOTAL) BY MOUTH 3 (THREE) TIMES DAILY.  . [DISCONTINUED] glimepiride (AMARYL) 2 MG tablet TAKE 1 TABLET (2 MG TOTAL) BY MOUTH DAILY BEFORE SUPPER.  . [DISCONTINUED] metFORMIN (GLUCOPHAGE-XR) 500 MG 24 hr tablet 4 tablets daily,  call to schedule follow-up appointment   No facility-administered encounter medications on file as of 07/19/2020.     Review of Systems  Review of Systems  Constitutional: Negative.  Negative for fatigue and fever.  HENT: Negative.   Respiratory: Negative for cough and shortness of breath.   Cardiovascular: Negative.  Negative for chest pain, palpitations and leg swelling.  Gastrointestinal: Negative.   Allergic/Immunologic: Negative.   Neurological:  Negative.   Psychiatric/Behavioral: Negative.        Physical Exam  BP 117/82   Pulse 68   Temp 97.9 F (36.6 C)   Resp 18   SpO2 100%   Wt Readings from Last 5 Encounters:  06/02/19 236 lb 6.4 oz (107.2 kg)  05/06/19 236 lb (107 kg)  08/03/18 229 lb 12.8 oz (104.2 kg)  03/16/18 238 lb 6.4 oz (108.1 kg)  08/12/17 227 lb 12.8 oz (103.3 kg)     Physical Exam Vitals and nursing note reviewed.  Constitutional:      General: He is not in acute distress.    Appearance: He is well-developed.  Cardiovascular:     Rate and Rhythm: Normal rate and regular rhythm.  Pulmonary:     Effort: Pulmonary effort is normal.     Breath sounds: Normal breath sounds.  Musculoskeletal:     Right lower leg: No edema.     Left lower leg: No edema.  Skin:    General: Skin is warm and dry.  Neurological:     Mental Status: He is alert and oriented to person, place, and time.  Psychiatric:        Mood and Affect: Mood normal.        Behavior: Behavior normal.        Assessment & Plan:   DM (diabetes mellitus) Neuropathy:  Please keep upcoming appointment with Dionisio Conan  Will need diabetic foot exam  Will need lab work  Diabetic diet  Will refill medications until upcoming appointment with Crystal - will need to take medications to upcoming appointment - will need education about prescriptions  History of Asbestos exposure - will need referral to pulmonary   Follow up:   If needed       Fenton Foy, NP 07/19/2020

## 2020-08-10 ENCOUNTER — Encounter (INDEPENDENT_AMBULATORY_CARE_PROVIDER_SITE_OTHER): Payer: Medicare HMO | Admitting: Ophthalmology

## 2020-08-15 ENCOUNTER — Ambulatory Visit (INDEPENDENT_AMBULATORY_CARE_PROVIDER_SITE_OTHER): Payer: Medicare HMO | Admitting: Nurse Practitioner

## 2020-08-15 ENCOUNTER — Encounter: Payer: Self-pay | Admitting: Nurse Practitioner

## 2020-08-15 ENCOUNTER — Other Ambulatory Visit: Payer: Self-pay

## 2020-08-15 VITALS — BP 165/67 | HR 79 | Temp 98.1°F | Ht 70.0 in | Wt 239.0 lb

## 2020-08-15 DIAGNOSIS — E1142 Type 2 diabetes mellitus with diabetic polyneuropathy: Secondary | ICD-10-CM | POA: Diagnosis not present

## 2020-08-15 DIAGNOSIS — Z1329 Encounter for screening for other suspected endocrine disorder: Secondary | ICD-10-CM | POA: Diagnosis not present

## 2020-08-15 DIAGNOSIS — R0609 Other forms of dyspnea: Secondary | ICD-10-CM

## 2020-08-15 DIAGNOSIS — R1310 Dysphagia, unspecified: Secondary | ICD-10-CM

## 2020-08-15 DIAGNOSIS — G4733 Obstructive sleep apnea (adult) (pediatric): Secondary | ICD-10-CM

## 2020-08-15 DIAGNOSIS — Z7689 Persons encountering health services in other specified circumstances: Secondary | ICD-10-CM

## 2020-08-15 DIAGNOSIS — E1140 Type 2 diabetes mellitus with diabetic neuropathy, unspecified: Secondary | ICD-10-CM | POA: Diagnosis not present

## 2020-08-15 DIAGNOSIS — Z130 Encounter for screening for diseases of the blood and blood-forming organs and certain disorders involving the immune mechanism: Secondary | ICD-10-CM

## 2020-08-15 DIAGNOSIS — R0602 Shortness of breath: Secondary | ICD-10-CM

## 2020-08-15 DIAGNOSIS — R0600 Dyspnea, unspecified: Secondary | ICD-10-CM | POA: Diagnosis not present

## 2020-08-15 LAB — POCT URINALYSIS DIPSTICK
Bilirubin, UA: NEGATIVE
Blood, UA: NEGATIVE
Glucose, UA: POSITIVE — AB
Leukocytes, UA: NEGATIVE
Nitrite, UA: NEGATIVE
Protein, UA: NEGATIVE
Spec Grav, UA: 1.025 (ref 1.010–1.025)
Urobilinogen, UA: 0.2 E.U./dL
pH, UA: 5.5 (ref 5.0–8.0)

## 2020-08-15 LAB — GLUCOSE, POCT (MANUAL RESULT ENTRY): POC Glucose: 222 mg/dl — AB (ref 70–99)

## 2020-08-15 LAB — POCT GLYCOSYLATED HEMOGLOBIN (HGB A1C)
HbA1c POC (<> result, manual entry): 8.6 % (ref 4.0–5.6)
HbA1c, POC (controlled diabetic range): 8.6 % — AB (ref 0.0–7.0)
HbA1c, POC (prediabetic range): 8.6 % — AB (ref 5.7–6.4)
Hemoglobin A1C: 8.6 % — AB (ref 4.0–5.6)

## 2020-08-15 NOTE — Progress Notes (Signed)
[place], [place]  [address] Phone:  [phone]   Fax:  [phone]   New Patient Office Visit  Subjective:  Patient ID: [name], male    DOB: 14-Aug-1950  Age: 70 y.o. MRN: [id number]  CC:  Chief Complaint  Patient presents with   New Patient (Initial Visit)    Was being seen at [facility] before they closed, he is needing to get back on his diabetic medication .  Needs referral to Pulmonary: Asbestos exposure     HPI [name] Presents to establish care. He  has a past medical history of Cancer ([name]), Diabetes mellitus without complication ([name]), GERD (gastroesophageal reflux disease), Hyperlipidemia, Hypertension, Myocardial infarction ([name]), and OSA (obstructive sleep apnea).   He is retired Geneticist, molecular.   Diabetes Mellitus Patient presents for follow up of diabetes. He was a previous patient at of [place]. Current symptoms include: hyperglycemia and paresthesia of the feet. Symptoms have gradually worsened. Patient denies foot ulcerations, nausea, and vomiting. Evaluation to date has included: hemoglobin A1C.   Current treatment: Continued metformin which has been somewhat effective and Continued Lisinopril which has been somewhat effective. He reports eating pork and eating late at night and generally one big meal per day. Last dilated eye exam: 2022  He has problems with his breathing. He is unable to lay down at night to sleep. He has OSA. He was on CPAP however this was not effective. He is need another nasal surgery. He has had several surgeries due to fractures; 3 reconstructive surgeries.   He has been exposed to asbestos most of his life. He has had abnormal images in the past . Most recent images were changed per patients report. He has a history of wheezing.  He does have problems with getting choked with eating. He was in the need of a having is throat stretched in the past however this was  declined.   He has CAD and is SP stress test. He was told that he had 8 blockages. He was later told that the blockage had cleared. He never smoked. He chewed tobacco.   History of PSA cancer.   He has peripheral neuropathy on Gabapentin. He has not feeling in his feet.  Past Medical History:  Diagnosis Date   Cancer [facility])    Prostate cancer age 50; Lupron treatments followed by prostatectomy.   Diabetes mellitus without complication (HCC)    GERD (gastroesophageal reflux disease)    Hyperlipidemia    Hypertension    Myocardial infarction [facility])    age 67; no stenting.   OSA (obstructive sleep apnea)    non-compliant with CPAP.    Past Surgical History:  Procedure Laterality Date   CHOLECYSTECTOMY     PROSTATE SURGERY      Family History  Problem Relation Age of Onset   Cancer Mother        leukemia   Diabetes Mother    Cancer Father 43       lung cancer with mets   Heart disease Maternal Grandmother    Cancer Maternal Grandfather    Heart disease Paternal Grandmother    Cancer Paternal Grandfather     Social History   Socioeconomic History   Marital status: Single    Spouse name: GF-Betty   Number of children: 2   Years of education: 11   Highest education level: Not on file  Occupational History  Occupation: Buyer, retail: [employer]   Tobacco Use   Smoking status: Never   Smokeless tobacco: Current    Types: Chew  Vaping Use   Vaping Use: Never used  Substance and Sexual Activity   Alcohol use: Yes    Comment: social   Drug use: No   Sexual activity: Not Currently  Other Topics Concern   Not on file  Social History Narrative   Marital status: divorced, girlfriend x 1977     Children: 1      Tobacco: none; chew tobacco      Alcohol: beers on weekends      Exercise: none   Social Determinants of Health   Financial Resource Strain: Not on file  Food Insecurity: Not on file  Transportation Needs: Not on file  Physical  Activity: Not on file  Stress: Not on file  Social Connections: Not on file  Intimate Partner Violence: Not on file    ROS Review of Systems  Objective:   Today's Vitals: BP (!) 165/67 (BP Location: Left Arm, Patient Position: Sitting)   Pulse 79   Temp 98.1 F (36.7 C)   Ht '5\' 10"'  (1.778 m)   Wt 239 lb (108.4 kg)   SpO2 98%   BMI 34.29 kg/m   Physical Exam Constitutional:      General: He is not in acute distress.    Appearance: He is obese. He is not ill-appearing, toxic-appearing or diaphoretic.  HENT:     Head: Normocephalic and atraumatic.     Right Ear: Tympanic membrane normal.     Left Ear: Tympanic membrane normal.     Nose: Nose normal.     Mouth/Throat:     Mouth: Mucous membranes are moist.     Pharynx: Oropharynx is clear.  Cardiovascular:     Rate and Rhythm: Normal rate and regular rhythm.     Pulses: Normal pulses.     Heart sounds: Normal heart sounds.  Pulmonary:     Effort: Pulmonary effort is normal.     Breath sounds: No stridor. No wheezing, rhonchi or rales.  Chest:     Chest wall: No tenderness.  Abdominal:     Palpations: Abdomen is soft.     Hernia: A hernia is present.     Comments: Increased abdominal girth Abdominal weakness  Musculoskeletal:        General: Normal range of motion.     Cervical back: Normal range of motion and neck supple.  Skin:    General: Skin is warm and dry.     Capillary Refill: Capillary refill takes less than 2 seconds.  Neurological:     General: No focal deficit present.     Mental Status: He is alert and oriented to person, place, and time.  Psychiatric:        Mood and Affect: Mood normal.        Behavior: Behavior normal.        Thought Content: Thought content normal.        Judgment: Judgment normal.    Assessment & Plan:   Problem List Items Addressed This Visit       Respiratory   OSA (obstructive sleep apnea) Worsening Referral additional treatment options   Relevant Orders    Ambulatory referral to Pulmonology     Endocrine   DM (diabetes mellitus) ([place]) Encourage compliance with current treatment regimen  Encourage regular CBG monitoring Encourage contacting office if excessive hyperglycemia and or  hypoglycemia Lifestyle modification with healthy diet (fewer calories, more high fiber foods, whole grains and non-starchy vegetables, lower fat meat and fish, low-fat diary include healthy oils) regular exercise (physical activity) and weight loss Opthalmology exam discussed  Nutritional consult recommended Regular dental visits encouraged Home BP monitoring also encouraged goal <130/80    Relevant Medications   lisinopril (ZESTRIL) 5 MG tablet   metFORMIN (GLUCOPHAGE-XR) 500 MG 24 hr tablet   Other Relevant Orders   Urinalysis Dipstick (Completed)   HgB A1c (Completed)   Glucose (CBG) (Completed)   Comp. Metabolic Panel (12) (Completed)   Lipid panel (Completed)   Ambulatory referral to Pulmonology   Ambulatory referral to Pulmonology     Other   DOE (dyspnea on exertion)   Relevant Orders   Ambulatory referral to Pulmonology   Other Visit Diagnoses     Encounter to establish care    -  Primary Discussed male health maintenance; self exams of breast and testicles  and annual exams. Discussed prostate age related concerns Discussed general safety in vehicle and COVID Discussed regular hydration with water Discussed healthy diet and exercise and weight management Discussed sexual health  Discussed mental health Encouraged to call our office for an appointment with in ongoing concerns for questions.      Screening for thyroid disorder       Relevant Orders   TSH (Completed)   Ambulatory referral to Pulmonology   Shortness of breath       Relevant Orders   Ambulatory referral to Pulmonology   Screening for deficiency anemia       Relevant Orders   CBC with Differential/Platelet (Completed)   Ambulatory referral to Pulmonology   Difficulty  swallowing liquids     Referral to GI in the future Desires to address on issue at a time.   Relevant Orders   Ambulatory referral to Pulmonology       Outpatient Encounter Medications as of 08/15/2020  Medication Sig   aspirin 81 MG tablet Take 81 mg by mouth daily.   azelastine (ASTELIN) 0.1 % nasal spray PLACE 2 SPRAYS INTO BOTH NOSTRILS 2 (TWO) TIMES DAILY. USE IN EACH NOSTRIL AS DIRECTED   BAYER CONTOUR NEXT TEST test strip CHECK BLOOD SUGAR TWICE DAILY   BAYER MICROLET LANCETS lancets Check blood sugar two times daily.   Blood Glucose Monitoring Suppl (BAYER CONTOUR NEXT MONITOR) w/Device KIT Check blood sugar two times daily.   gabapentin (NEURONTIN) 300 MG capsule Take 3 capsules (900 mg total) by mouth 2 (two) times daily.   lisinopril (ZESTRIL) 5 MG tablet Take 1 tablet (5 mg total) by mouth daily.   fluvastatin XL (LESCOL XL) 80 MG 24 hr tablet Take 1 tablet (80 mg total) by mouth daily. start taking every other day for the first 2 weeks then daily (Patient not taking: Reported on 08/15/2020)   metFORMIN (GLUCOPHAGE-XR) 500 MG 24 hr tablet Take 1 tablet (500 mg total) by mouth daily with breakfast. 4 tablets daily   omeprazole (PRILOSEC) 20 MG capsule TAKE 1 CAPSULE (20 MG TOTAL) BY MOUTH 2 (TWO) TIMES DAILY BEFORE A MEAL. (Patient not taking: Reported on 08/15/2020)   [DISCONTINUED] metFORMIN (GLUCOPHAGE-XR) 500 MG 24 hr tablet 4 tablets daily   No facility-administered encounter medications on file as of 08/15/2020.    Follow-up: Return in about 6 weeks (around 09/26/2020) for follow up DM [id number].   Vevelyn Francois, NP

## 2020-08-15 NOTE — Patient Instructions (Addendum)

## 2020-08-16 LAB — COMP. METABOLIC PANEL (12)
AST: 24 IU/L (ref 0–40)
Albumin/Globulin Ratio: 1.9 (ref 1.2–2.2)
Albumin: 4.1 g/dL (ref 3.8–4.8)
Alkaline Phosphatase: 98 IU/L (ref 44–121)
BUN/Creatinine Ratio: 13 (ref 10–24)
BUN: 13 mg/dL (ref 8–27)
Bilirubin Total: 0.5 mg/dL (ref 0.0–1.2)
Calcium: 9.3 mg/dL (ref 8.6–10.2)
Chloride: 100 mmol/L (ref 96–106)
Creatinine, Ser: 1.04 mg/dL (ref 0.76–1.27)
Globulin, Total: 2.2 g/dL (ref 1.5–4.5)
Glucose: 201 mg/dL — ABNORMAL HIGH (ref 65–99)
Potassium: 4.8 mmol/L (ref 3.5–5.2)
Sodium: 140 mmol/L (ref 134–144)
Total Protein: 6.3 g/dL (ref 6.0–8.5)
eGFR: 79 mL/min/{1.73_m2} (ref >59)

## 2020-08-16 LAB — LIPID PANEL
Chol/HDL Ratio: 4.3 ratio (ref 0.0–5.0)
Cholesterol, Total: 219 mg/dL — ABNORMAL HIGH (ref 100–199)
HDL: 51 mg/dL (ref >39)
LDL Chol Calc (NIH): 128 mg/dL — ABNORMAL HIGH (ref 0–99)
Triglycerides: 226 mg/dL — ABNORMAL HIGH (ref 0–149)
VLDL Cholesterol Cal: 40 mg/dL (ref 5–40)

## 2020-08-16 LAB — CBC WITH DIFFERENTIAL/PLATELET
Basophils Absolute: 0 10*3/uL (ref 0.0–0.2)
Basos: 0 %
EOS (ABSOLUTE): 0.1 10*3/uL (ref 0.0–0.4)
Eos: 1 %
Hematocrit: 45 % (ref 37.5–51.0)
Hemoglobin: 14.9 g/dL (ref 13.0–17.7)
Immature Grans (Abs): 0 10*3/uL (ref 0.0–0.1)
Immature Granulocytes: 0 %
Lymphocytes Absolute: 2.8 10*3/uL (ref 0.7–3.1)
Lymphs: 28 %
MCH: 30.8 pg (ref 26.6–33.0)
MCHC: 33.1 g/dL (ref 31.5–35.7)
MCV: 93 fL (ref 79–97)
Monocytes Absolute: 0.8 10*3/uL (ref 0.1–0.9)
Monocytes: 9 %
Neutrophils Absolute: 5.9 10*3/uL (ref 1.4–7.0)
Neutrophils: 62 %
Platelets: 154 10*3/uL (ref 150–450)
RBC: 4.83 x10E6/uL (ref 4.14–5.80)
RDW: 12.5 % (ref 11.6–15.4)
WBC: 9.7 10*3/uL (ref 3.4–10.8)

## 2020-08-16 LAB — TSH: TSH: 4 u[IU]/mL (ref 0.450–4.500)

## 2020-08-17 ENCOUNTER — Other Ambulatory Visit: Payer: Self-pay | Admitting: Nurse Practitioner

## 2020-08-17 DIAGNOSIS — G8929 Other chronic pain: Secondary | ICD-10-CM

## 2020-08-17 DIAGNOSIS — E1142 Type 2 diabetes mellitus with diabetic polyneuropathy: Secondary | ICD-10-CM

## 2020-08-17 MED ORDER — LISINOPRIL 5 MG PO TABS: 5.0000 mg | ORAL_TABLET | Freq: Every day | ORAL | 3 refills | Status: DC

## 2020-08-17 MED ORDER — METFORMIN HCL ER 500 MG PO TB24: 500.0000 mg | ORAL_TABLET | Freq: Every day | ORAL | 3 refills | Status: DC

## 2020-08-20 ENCOUNTER — Telehealth: Payer: Self-pay | Admitting: Nurse Practitioner

## 2020-08-20 NOTE — Telephone Encounter (Signed)
Patient notified of results. Verbally understood, no additional questions or concerns.

## 2020-08-20 NOTE — Telephone Encounter (Signed)
-----   Message from Vevelyn Francois, NP sent at 08/19/2020  7:18 PM EDT ----- Overall your labs are stable.  There are no current changes needed to your treatment plan. We will repeat fasting labs at next follow up , thanks

## 2020-08-23 ENCOUNTER — Other Ambulatory Visit: Payer: Self-pay

## 2020-08-23 ENCOUNTER — Encounter (INDEPENDENT_AMBULATORY_CARE_PROVIDER_SITE_OTHER): Payer: Medicare HMO | Admitting: Ophthalmology

## 2020-08-23 DIAGNOSIS — I10 Essential (primary) hypertension: Secondary | ICD-10-CM

## 2020-08-23 DIAGNOSIS — H43813 Vitreous degeneration, bilateral: Secondary | ICD-10-CM | POA: Diagnosis not present

## 2020-08-23 DIAGNOSIS — E113311 Type 2 diabetes mellitus with moderate nonproliferative diabetic retinopathy with macular edema, right eye: Secondary | ICD-10-CM

## 2020-08-23 DIAGNOSIS — H35033 Hypertensive retinopathy, bilateral: Secondary | ICD-10-CM

## 2020-08-23 DIAGNOSIS — E113392 Type 2 diabetes mellitus with moderate nonproliferative diabetic retinopathy without macular edema, left eye: Secondary | ICD-10-CM

## 2020-10-18 ENCOUNTER — Encounter (INDEPENDENT_AMBULATORY_CARE_PROVIDER_SITE_OTHER): Payer: Medicare HMO | Admitting: Ophthalmology

## 2020-10-18 ENCOUNTER — Other Ambulatory Visit: Payer: Self-pay

## 2020-10-18 DIAGNOSIS — I10 Essential (primary) hypertension: Secondary | ICD-10-CM | POA: Diagnosis not present

## 2020-10-18 DIAGNOSIS — E113313 Type 2 diabetes mellitus with moderate nonproliferative diabetic retinopathy with macular edema, bilateral: Secondary | ICD-10-CM | POA: Diagnosis not present

## 2020-10-18 DIAGNOSIS — H43813 Vitreous degeneration, bilateral: Secondary | ICD-10-CM

## 2020-10-18 DIAGNOSIS — H35033 Hypertensive retinopathy, bilateral: Secondary | ICD-10-CM | POA: Diagnosis not present

## 2020-10-25 ENCOUNTER — Ambulatory Visit (INDEPENDENT_AMBULATORY_CARE_PROVIDER_SITE_OTHER): Payer: Medicare HMO | Admitting: Nurse Practitioner

## 2020-10-25 ENCOUNTER — Other Ambulatory Visit: Payer: Self-pay

## 2020-10-25 ENCOUNTER — Encounter: Payer: Self-pay | Admitting: Nurse Practitioner

## 2020-10-25 VITALS — BP 146/64 | HR 86 | Temp 97.3°F | Ht 70.0 in | Wt 234.1 lb

## 2020-10-25 DIAGNOSIS — Z1211 Encounter for screening for malignant neoplasm of colon: Secondary | ICD-10-CM

## 2020-10-25 DIAGNOSIS — E1140 Type 2 diabetes mellitus with diabetic neuropathy, unspecified: Secondary | ICD-10-CM | POA: Diagnosis not present

## 2020-10-25 DIAGNOSIS — K5900 Constipation, unspecified: Secondary | ICD-10-CM | POA: Diagnosis not present

## 2020-10-25 DIAGNOSIS — G8929 Other chronic pain: Secondary | ICD-10-CM | POA: Diagnosis not present

## 2020-10-25 MED ORDER — POLYETHYLENE GLYCOL 3350 17 GM/SCOOP PO POWD
17.0000 g | Freq: Every day | ORAL | 0 refills | Status: AC
Start: 2020-10-25 — End: 2020-11-24

## 2020-10-25 MED ORDER — GABAPENTIN 300 MG PO CAPS: 900.0000 mg | ORAL_CAPSULE | Freq: Two times a day (BID) | ORAL | 0 refills | Status: DC

## 2020-10-25 NOTE — Progress Notes (Signed)
[facility] Patient [facility] [address] [place], [place]  [address] Phone:  [phone]   Fax:  [phone]   Established Patient Office Visit  Subjective:  Patient ID: Clayton Semper., male    DOB: 1950-04-18  Age: 70 y.o. MRN: [id number]  CC:  Chief Complaint  Patient presents with   Follow-up    6 week follow up; constipation and pain requesting colonoscopy never had one.     HPI Clayton Tarnow. presents for follow up. He  has a past medical history of Cancer (HCC), Diabetes mellitus without complication (HCC), GERD (gastroesophageal reflux disease), Hyperlipidemia, Hypertension, Myocardial infarction (HCC), and OSA (obstructive sleep apnea).   Patient presents for follow up of diabetes. Current symptoms include: hyperglycemia and paresthesia of the feet. Symptoms have gradually worsened. Patient denies foot ulcerations, nausea, and vomiting. Evaluation to date has included: hemoglobin A1C.   Current treatment: Continued metformin which has been somewhat effective and Continued Lisinopril which has been somewhat effective.  Constipation Patient complains of constipation. Onset was a few weeks ago. Patient has been having  difficulty  formed stools . Defecation has been difficult and painful. He had to seat for 90 minutes to void. Co-Morbid conditions:none. Symptoms have gradually worsened. Current Health Habits: Eating fiber? Eats raisin bran, Exercise? no, Current over the none.      Past Medical History:  Diagnosis Date   Cancer [facility])    Prostate cancer age 75; Lupron treatments followed by prostatectomy.   Diabetes mellitus without complication (HCC)    GERD (gastroesophageal reflux disease)    Hyperlipidemia    Hypertension    Myocardial infarction [facility])    age 70; no stenting.   OSA (obstructive sleep apnea)    non-compliant with CPAP.    Past Surgical History:  Procedure Laterality Date   CHOLECYSTECTOMY     PROSTATE SURGERY      Family History   Problem Relation Age of Onset   Cancer Mother        leukemia   Diabetes Mother    Cancer Father 37       lung cancer with mets   Heart disease Maternal Grandmother    Cancer Maternal Grandfather    Heart disease Paternal Grandmother    Cancer Paternal Grandfather     Social History   Socioeconomic History   Marital status: Single    Spouse name: GF-Betty   Number of children: 2   Years of education: 11   Highest education level: Not on file  Occupational History   Occupation: Event organiser: [employer]   Tobacco Use   Smoking status: Never   Smokeless tobacco: Current    Types: Chew  Vaping Use   Vaping Use: Never used  Substance and Sexual Activity   Alcohol use: Yes    Comment: social   Drug use: No   Sexual activity: Not Currently  Other Topics Concern   Not on file  Social History Narrative   Marital status: divorced, girlfriend x 1977     Children: 1      Tobacco: none; chew tobacco      Alcohol: beers on weekends      Exercise: none   Social Determinants of Health   Financial Resource Strain: Not on file  Food Insecurity: Not on file  Transportation Needs: Not on file  Physical Activity: Not on file  Stress: Not on file  Social Connections: Not on file  Intimate  Partner Violence: Not on file    Outpatient Medications Prior to Visit  Medication Sig Dispense Refill   aspirin 81 MG tablet Take 81 mg by mouth daily.     azelastine (ASTELIN) 0.1 % nasal spray PLACE 2 SPRAYS INTO BOTH NOSTRILS 2 (TWO) TIMES DAILY. USE IN EACH NOSTRIL AS DIRECTED 30 mL 1   BAYER CONTOUR NEXT TEST test strip CHECK BLOOD SUGAR TWICE DAILY 100 each 5   BAYER MICROLET LANCETS lancets Check blood sugar two times daily. 100 each 5   Blood Glucose Monitoring Suppl (BAYER CONTOUR NEXT MONITOR) w/Device KIT Check blood sugar two times daily. 1 kit 2   fluvastatin XL (LESCOL XL) 80 MG 24 hr tablet Take 1 tablet (80 mg total) by mouth daily. start taking every  other day for the first 2 weeks then daily 30 tablet 3   lisinopril (ZESTRIL) 5 MG tablet Take 1 tablet (5 mg total) by mouth daily. 90 tablet 3   metFORMIN (GLUCOPHAGE-XR) 500 MG 24 hr tablet Take 1 tablet (500 mg total) by mouth daily with breakfast. 4 tablets daily 90 tablet 3   omeprazole (PRILOSEC) 20 MG capsule TAKE 1 CAPSULE (20 MG TOTAL) BY MOUTH 2 (TWO) TIMES DAILY BEFORE A MEAL. 180 capsule 1   trimethoprim-polymyxin b (POLYTRIM) ophthalmic solution      gabapentin (NEURONTIN) 300 MG capsule Take 3 capsules (900 mg total) by mouth 2 (two) times daily. 180 capsule 0   No facility-administered medications prior to visit.    Allergies  Allergen Reactions   Cymbalta [Duloxetine Hcl] Nausea And Vomiting   Darvocet [Propoxyphene N-Acetaminophen] Hives    ROS Review of Systems    Objective:    Physical Exam Constitutional:      General: He is not in acute distress.    Appearance: He is obese. He is not ill-appearing, toxic-appearing or diaphoretic.  HENT:     Head: Normocephalic and atraumatic.     Right Ear: Tympanic membrane normal.     Left Ear: Tympanic membrane normal.     Nose: Nose normal.     Mouth/Throat:     Mouth: Mucous membranes are moist.     Pharynx: Oropharynx is clear.  Cardiovascular:     Rate and Rhythm: Normal rate and regular rhythm.     Pulses: Normal pulses.     Heart sounds: Normal heart sounds.  Pulmonary:     Effort: Pulmonary effort is normal.     Breath sounds: Normal breath sounds. No stridor. No wheezing, rhonchi or rales.  Chest:     Chest wall: No tenderness.  Abdominal:     Palpations: Abdomen is soft.     Hernia: A hernia is present.     Comments: Increased abdominal girth Abdominal weakness  Musculoskeletal:        General: Normal range of motion.     Cervical back: Normal range of motion and neck supple.  Skin:    General: Skin is warm and dry.     Capillary Refill: Capillary refill takes less than 2 seconds.  Neurological:      General: No focal deficit present.     Mental Status: He is alert and oriented to person, place, and time.  Psychiatric:        Mood and Affect: Mood normal.        Behavior: Behavior normal.        Thought Content: Thought content normal.        Judgment: Judgment normal.  BP (!) 146/64 (BP Location: Right Arm, Patient Position: Sitting)   Pulse 86   Temp (!) 97.3 F (36.3 C)   Ht $R'5\' 10"'jo$  (1.778 m)   Wt 234 lb 0.8 oz (106.2 kg)   SpO2 98%   BMI 33.58 kg/m  Wt Readings from Last 3 Encounters:  10/25/20 234 lb 0.8 oz (106.2 kg)  08/15/20 239 lb (108.4 kg)  06/02/19 236 lb 6.4 oz (107.2 kg)     Health Maintenance Due  Topic Date Due   INFLUENZA VACCINE  10/01/2020    There are no preventive care reminders to display for this patient.  Lab Results  Component Value Date   TSH 4.000 08/15/2020   Lab Results  Component Value Date   WBC 9.7 08/15/2020   HGB 14.9 08/15/2020   HCT 45.0 08/15/2020   MCV 93 08/15/2020   PLT 154 08/15/2020   Lab Results  Component Value Date   NA 140 08/15/2020   K 4.8 08/15/2020   CO2 20 05/06/2019   GLUCOSE 201 (H) 08/15/2020   BUN 13 08/15/2020   CREATININE 1.04 08/15/2020   BILITOT 0.5 08/15/2020   ALKPHOS 98 08/15/2020   AST 24 08/15/2020   ALT 49 (H) 05/06/2019   PROT 6.3 08/15/2020   ALBUMIN 4.1 08/15/2020   CALCIUM 9.3 08/15/2020   EGFR 79 08/15/2020   GFR 92.83 08/03/2018   Lab Results  Component Value Date   CHOL 219 (H) 08/15/2020   Lab Results  Component Value Date   HDL 51 08/15/2020   Lab Results  Component Value Date   LDLCALC 128 (H) 08/15/2020   Lab Results  Component Value Date   TRIG 226 (H) 08/15/2020   Lab Results  Component Value Date   CHOLHDL 4.3 08/15/2020   Lab Results  Component Value Date   HGBA1C 8.6 (A) 08/15/2020   HGBA1C 8.6 08/15/2020   HGBA1C 8.6 (A) 08/15/2020   HGBA1C 8.6 (A) 08/15/2020      Assessment & Plan:   Problem List Items Addressed This Visit        Endocrine   DM (diabetes mellitus) ([place]) - Primary Stable Encourage compliance with current treatment regimen Encourage regular CBG monitoring Encourage contacting office if excessive hyperglycemia and or hypoglycemia Lifestyle modification with healthy diet (fewer calories, more high fiber foods, whole grains and non-starchy vegetables, lower fat meat and fish, low-fat diary include healthy oils) regular exercise (physical activity) and weight loss Opthalmology exam discussed  Nutritional consult recommended Regular dental visits encouraged Home BP monitoring also encouraged goal <130/80      Other   Other chronic pain   Relevant Medications   gabapentin (NEURONTIN) 300 MG capsule   Other Visit Diagnoses     Screening for colon cancer       Constipation, unspecified constipation type       Relevant Medications   polyethylene glycol powder (GLYCOLAX/MIRALAX) 17 GM/SCOOP powder   Other Relevant Orders   Ambulatory referral to Gastroenterology       Meds ordered this encounter  Medications   gabapentin (NEURONTIN) 300 MG capsule    Sig: Take 3 capsules (900 mg total) by mouth 2 (two) times daily.    Dispense:  180 capsule    Refill:  0   polyethylene glycol powder (GLYCOLAX/MIRALAX) 17 GM/SCOOP powder    Sig: Take 17 g by mouth daily. Follow instructions in the bottle. Use no more than 7 days. PRN for occasional constipation.    Dispense:  500  g    Refill:  0    This is an OTC product. This prescription is to serve as a reminder to the patient as to our preference.    Order Specific Question:   Supervising Provider    Answer:   Tresa Garter [[id number]]    Follow-up: Return in about 3 months (around 01/25/2021).    Vevelyn Francois, NP

## 2020-10-25 NOTE — Patient Instructions (Signed)

## 2020-10-26 ENCOUNTER — Encounter: Payer: Self-pay | Admitting: Nurse Practitioner

## 2020-11-23 ENCOUNTER — Encounter: Payer: Self-pay | Admitting: Pulmonary Disease

## 2020-11-23 ENCOUNTER — Other Ambulatory Visit: Payer: Self-pay

## 2020-11-23 ENCOUNTER — Ambulatory Visit (INDEPENDENT_AMBULATORY_CARE_PROVIDER_SITE_OTHER): Payer: Medicare HMO | Admitting: Pulmonary Disease

## 2020-11-23 VITALS — BP 150/78 | HR 87 | Temp 98.6°F | Ht 70.0 in | Wt 238.8 lb

## 2020-11-23 DIAGNOSIS — G4733 Obstructive sleep apnea (adult) (pediatric): Secondary | ICD-10-CM | POA: Diagnosis not present

## 2020-11-23 NOTE — Progress Notes (Signed)
[name]    [id number]    Mar 19, 1950  Primary Care Physician:King, Diona Foley, NP  Referring Physician: Vevelyn Francois, NP [address] [place],  [place] [address]  Chief complaint:   Patient being evaluated for obstructive sleep apnea  HPI:  Had a sleep study many years ago over 10 years ago showing very severe obstructive sleep apnea He did try CPAP for a few weeks and could not tolerate it  Has had multiple nasal trauma, 2 no surgeries in the past The last time that he did see ENT, surgery was recommended, it was felt that one of the reasons why he could not tolerate CPAP at that time was because of nasal obstruction  He has not tried to use CPAP for years  Describes shortness of breath, persistent lower rib cage discomfort Exposure to asbestos in the past -We did review a CT scan that was performed in 2016 that did not show any evidence of pleural thickening or interstitial infiltrates  Is sleepy during the day Usually goes to bed between 12 and 2 AM Takes him a while to fall asleep States he wakes up up to 10 times during the night Final wake up time between 10 AM and 11 AM  He has dryness of his mouth in the mornings, sore throat  History of hypertension, history of myocardial infarction, history of diabetes, hypercholesterolemia  Never smoker Chewed tobacco  Outpatient Encounter Medications as of 11/23/2020  Medication Sig   aspirin 81 MG tablet Take 81 mg by mouth daily.   azelastine (ASTELIN) 0.1 % nasal spray PLACE 2 SPRAYS INTO BOTH NOSTRILS 2 (TWO) TIMES DAILY. USE IN EACH NOSTRIL AS DIRECTED   BAYER CONTOUR NEXT TEST test strip CHECK BLOOD SUGAR TWICE DAILY   BAYER MICROLET LANCETS lancets Check blood sugar two times daily.   Blood Glucose Monitoring Suppl (BAYER CONTOUR NEXT MONITOR) w/Device KIT Check blood sugar two times daily.   gabapentin (NEURONTIN) 300 MG capsule Take 3 capsules (900 mg total) by mouth 2 (two) times daily.    lisinopril (ZESTRIL) 5 MG tablet Take 1 tablet (5 mg total) by mouth daily.   metFORMIN (GLUCOPHAGE-XR) 500 MG 24 hr tablet Take 1 tablet (500 mg total) by mouth daily with breakfast. 4 tablets daily   omeprazole (PRILOSEC) 20 MG capsule TAKE 1 CAPSULE (20 MG TOTAL) BY MOUTH 2 (TWO) TIMES DAILY BEFORE A MEAL.   polyethylene glycol powder (GLYCOLAX/MIRALAX) 17 GM/SCOOP powder Take 17 g by mouth daily. Follow instructions in the bottle. Use no more than 7 days. PRN for occasional constipation.   trimethoprim-polymyxin b (POLYTRIM) ophthalmic solution    fluvastatin XL (LESCOL XL) 80 MG 24 hr tablet Take 1 tablet (80 mg total) by mouth daily. start taking every other day for the first 2 weeks then daily (Patient not taking: Reported on 11/23/2020)   No facility-administered encounter medications on file as of 11/23/2020.    Allergies as of 11/23/2020 - Review Complete 11/23/2020  Allergen Reaction Noted   Cymbalta [duloxetine hcl] Nausea And Vomiting 12/18/2014   Darvocet [propoxyphene n-acetaminophen] Hives 11/09/2011    Past Medical History:  Diagnosis Date   Cancer [facility])    Prostate cancer age 70; Lupron treatments followed by prostatectomy.   Diabetes mellitus without complication (HCC)    GERD (gastroesophageal reflux disease)    Hyperlipidemia    Hypertension    Myocardial infarction [facility])    age 70; no  stenting.   OSA (obstructive sleep apnea)    non-compliant with CPAP.    Past Surgical History:  Procedure Laterality Date   CHOLECYSTECTOMY     PROSTATE SURGERY      Family History  Problem Relation Age of Onset   Cancer Mother        leukemia   Diabetes Mother    Cancer Father 97       lung cancer with mets   Heart disease Maternal Grandmother    Cancer Maternal Grandfather    Heart disease Paternal Grandmother    Cancer Paternal Grandfather     Social History   Socioeconomic History   Marital status: Single    Spouse name: GF-Betty   Number of children: 2    Years of education: 11   Highest education level: Not on file  Occupational History   Occupation: Buyer, retail: [employer]   Tobacco Use   Smoking status: Never   Smokeless tobacco: Current    Types: Chew  Vaping Use   Vaping Use: Never used  Substance and Sexual Activity   Alcohol use: Yes    Comment: social   Drug use: No   Sexual activity: Not Currently  Other Topics Concern   Not on file  Social History Narrative   Marital status: divorced, girlfriend x 1977     Children: 1      Tobacco: none; chew tobacco      Alcohol: beers on weekends      Exercise: none   Social Determinants of Health   Financial Resource Strain: Not on file  Food Insecurity: Not on file  Transportation Needs: Not on file  Physical Activity: Not on file  Stress: Not on file  Social Connections: Not on file  Intimate Partner Violence: Not on file    Review of Systems  Constitutional:  Positive for fatigue.  Respiratory:  Positive for apnea and shortness of breath.   Psychiatric/Behavioral:  Positive for sleep disturbance.    Vitals:   11/23/20 1525  BP: (!) 150/78  Pulse: 87  Temp: 98.6 F (37 C)  SpO2: 98%     Physical Exam Constitutional:      Appearance: He is obese.  HENT:     Right Ear: Tympanic membrane normal.     Mouth/Throat:     Mouth: Mucous membranes are moist.  Cardiovascular:     Rate and Rhythm: Normal rate and regular rhythm.     Heart sounds: No murmur heard.   No friction rub.  Pulmonary:     Effort: No respiratory distress.     Breath sounds: No stridor. No wheezing or rhonchi.  Musculoskeletal:     Cervical back: No rigidity or tenderness.  Neurological:     Mental Status: He is alert.  Psychiatric:        Mood and Affect: Mood normal.     Data Reviewed: Previous sleep study is not available to be reviewed Was performed about 10 years ago Was told he had stopped breathing over 200 times an hour  Assessment:  Severe  obstructive sleep apnea  Intolerant to CPAP  No recent sleep study to evaluate  Requests evaluation by ENT  Plan/Recommendations: Referral to Dr. Benjamine Mola whom he has seen before  Schedule patient for an in lab polysomnogram  Schedule for pulmonary function test  Schedule for CT scan of the chest without contrast   Sherrilyn Rist MD [facility] 11/23/2020, 3:59 PM  CC: Vevelyn Francois, NP

## 2020-11-23 NOTE — Patient Instructions (Signed)
History of severe sleep apnea with persistent nasal stuffiness and congestion  Referral to Dr. Benjamine Mola -ENT for evaluation  In lab sleep study-full night study  Pulmonary function test  CT scan of the chest without contrast -History of exposure to asbestos, persistent lower rib cage discomfort  Tentative follow-up in 3 months

## 2020-11-25 ENCOUNTER — Encounter: Payer: Self-pay | Admitting: Pulmonary Disease

## 2020-11-25 ENCOUNTER — Ambulatory Visit (INDEPENDENT_AMBULATORY_CARE_PROVIDER_SITE_OTHER): Payer: Medicare HMO

## 2020-11-25 DIAGNOSIS — Z0000 Encounter for general adult medical examination without abnormal findings: Secondary | ICD-10-CM

## 2020-11-25 NOTE — Progress Notes (Signed)
Subjective:   Clayton Prom. is a 70 y.o. male who presents for Medicare Annual/Subsequent preventive examination.I connected with  [name] on 11/25/20 by a audio enabled telemedicine application and verified that I am speaking with the correct person using two identifiers.   I discussed the limitations of evaluation and management by telemedicine. The patient expressed understanding and agreed to proceed.   Location of patient: Home Location of provider: Office  Persons participating in visit: [name] (patient) and Drenda Freeze, [place]  Review of Systems    Defer to PCP       Objective:    There were no vitals filed for this visit. There is no height or weight on file to calculate BMI.  Advanced Directives 11/25/2020 05/06/2019 10/02/2016  Does Patient Have a Medical Advance Directive? No Yes No  Type of Advance Directive - Living will -    Current Medications (verified) Outpatient Encounter Medications as of 11/25/2020  Medication Sig   aspirin 81 MG tablet Take 81 mg by mouth daily.   azelastine (ASTELIN) 0.1 % nasal spray PLACE 2 SPRAYS INTO BOTH NOSTRILS 2 (TWO) TIMES DAILY. USE IN EACH NOSTRIL AS DIRECTED   BAYER CONTOUR NEXT TEST test strip CHECK BLOOD SUGAR TWICE DAILY   BAYER MICROLET LANCETS lancets Check blood sugar two times daily.   Blood Glucose Monitoring Suppl (BAYER CONTOUR NEXT MONITOR) w/Device KIT Check blood sugar two times daily.   lisinopril (ZESTRIL) 5 MG tablet Take 1 tablet (5 mg total) by mouth daily.   metFORMIN (GLUCOPHAGE-XR) 500 MG 24 hr tablet Take 1 tablet (500 mg total) by mouth daily with breakfast. 4 tablets daily   omeprazole (PRILOSEC) 20 MG capsule TAKE 1 CAPSULE (20 MG TOTAL) BY MOUTH 2 (TWO) TIMES DAILY BEFORE A MEAL.   trimethoprim-polymyxin b (POLYTRIM) ophthalmic solution    fluvastatin XL (LESCOL XL) 80 MG 24 hr tablet Take 1 tablet (80 mg total) by mouth daily. start taking every other day for the first 2 weeks then  daily (Patient not taking: No sig reported)   gabapentin (NEURONTIN) 300 MG capsule Take 3 capsules (900 mg total) by mouth 2 (two) times daily.   No facility-administered encounter medications on file as of 11/25/2020.    Allergies (verified) Cymbalta [duloxetine hcl] and Darvocet [propoxyphene n-acetaminophen]   History: Past Medical History:  Diagnosis Date   Cancer ([place])    Prostate cancer age 77; Lupron treatments followed by prostatectomy.   Diabetes mellitus without complication (HCC)    GERD (gastroesophageal reflux disease)    Hyperlipidemia    Hypertension    Myocardial infarction [facility])    age 64; no stenting.   OSA (obstructive sleep apnea)    non-compliant with CPAP.   Past Surgical History:  Procedure Laterality Date   CHOLECYSTECTOMY     PROSTATE SURGERY     Family History  Problem Relation Age of Onset   Cancer Mother        leukemia   Diabetes Mother    Cancer Father 4       lung cancer with mets   Heart disease Maternal Grandmother    Cancer Maternal Grandfather    Heart disease Paternal Grandmother    Cancer Paternal Grandfather    Social History   Socioeconomic History   Marital status: Single    Spouse name: [name]   Number of children: 2   Years of education: 11   Highest education level: Not on file  Occupational History   Occupation: Buyer, retail: House   Tobacco Use   Smoking status: Never   Smokeless tobacco: Current    Types: Chew  Vaping Use   Vaping Use: Never used  Substance and Sexual Activity   Alcohol use: Yes    Comment: social   Drug use: No   Sexual activity: Not Currently  Other Topics Concern   Not on file  Social History Narrative   Marital status: divorced, girlfriend x 1977     Children: 1      Tobacco: none; chew tobacco      Alcohol: beers on weekends      Exercise: none   Social Determinants of Health   Financial Resource Strain: Low Risk    Difficulty of Paying Living  Expenses: Not hard at all  Food Insecurity: Food Insecurity Present   Worried About Charity fundraiser in the Last Year: Sometimes true   Arboriculturist in the Last Year: Sometimes true  Transportation Needs: No Transportation Needs   Lack of Transportation (Medical): No   Lack of Transportation (Non-Medical): No  Physical Activity: Insufficiently Active   Days of Exercise per Week: 7 days   Minutes of Exercise per Session: 10 min  Stress: Stress Concern Present   Feeling of Stress : To some extent  Social Connections: Moderately Isolated   Frequency of Communication with Friends and Family: More than three times a week   Frequency of Social Gatherings with Friends and Family: More than three times a week   Attends Religious Services: More than 4 times per year   Active Member of [employer] or Organizations: No   Attends Music therapist: Never   Marital Status: Divorced    Tobacco Counseling Ready to quit: Not Answered Counseling given: Not Answered   Clinical Intake:  Pre-visit preparation completed: No        Diabetes: Yes CBG done?: No Did pt. bring in CBG monitor from home?: No  How often do you need to have someone help you when you read instructions, pamphlets, or other written materials from your doctor or pharmacy?: 1 - Never What is the last grade level you completed in school?: 11th grade  Diabetic?Yes  Interpreter Needed?: No  Information entered by :: Drenda Freeze, [place]   Activities of Daily Living In your present state of health, do you have any difficulty performing the following activities: 11/25/2020  Hearing? Y  Vision? N  Difficulty concentrating or making decisions? Y  Walking or climbing stairs? Y  Dressing or bathing? N  Doing errands, shopping? N  Preparing Food and eating ? N  Using the Toilet? N  In the past six months, have you accidently leaked urine? Y  Do you have problems with loss of bowel control? Y  Managing your  Medications? Y  Managing your Finances? N  Housekeeping or managing your Housekeeping? N  Some recent data might be hidden    Patient Care Team: Vevelyn Francois, NP as PCP - General (Adult Health Nurse Practitioner) Elayne Snare, MD as Consulting Physician (Endocrinology) Leta Baptist, MD as Consulting Physician (Otolaryngology) [facility] Hayden Pedro, MD as Consulting Physician (Ophthalmology)  Indicate any recent Medical Services you may have received from other than [facility] providers in the past year (date may be approximate).     Assessment:   This is a routine wellness examination for [name]  Hearing/Vision screen No results found.  Dietary issues and exercise activities discussed:     Goals Addressed   None    Depression Screen PHQ 2/9 Scores 11/25/2020 10/25/2020 08/15/2020 05/06/2019 12/21/2018 03/16/2018 08/12/2017  PHQ - 2 Score 2 0 2 0 0 0 0  PHQ- 9 Score 10 - 5 - - - -    Fall Risk Fall Risk  11/25/2020 10/25/2020 08/15/2020 05/06/2019 12/21/2018  Falls in the past year? 1 1 0 1 0  Number falls in past yr: 1 0 0 1 0  Injury with Fall? 0 0 0 0 0  Comment - - - - -  Follow up Falls evaluation completed - - Falls evaluation completed -    FALL RISK PREVENTION PERTAINING TO THE HOME:  Any stairs in or around the home? No  If so, are there any without handrails?  N/A Home free of loose throw rugs in walkways, pet beds, electrical cords, etc? Yes  Adequate lighting in your home to reduce risk of falls? Yes   ASSISTIVE DEVICES UTILIZED TO PREVENT FALLS:  Life alert? No  Use of a cane, walker or w/c? No  Grab bars in the bathroom? Yes  Shower chair or bench in shower? No  Elevated toilet seat or a handicapped toilet? No   TIMED UP AND GO:  Was the test performed?  N/A .  Length of time to ambulate 10 feet: N/A sec.     Cognitive Function:     6CIT Screen 11/25/2020 05/06/2019  What Year? 0 points 0 points  What month? 0 points 0 points   What time? 0 points 0 points  Count back from 20 0 points 0 points  Months in reverse 4 points 2 points  Repeat phrase 2 points 2 points  Total Score 6 4    Immunizations Immunization History  Administered Date(s) Administered   Influenza, High Dose Seasonal PF 11/24/2019   Influenza,inj,Quad PF,6+ Mos 11/04/2016, 12/27/2017, 11/09/2018   Influenza-Unspecified 11/16/2015, 12/27/2017, 12/27/2017   Moderna Sars-Covid-2 Vaccination 05/13/2019, 06/15/2019, 02/23/2020   Pneumococcal Polysaccharide-23 08/04/2015, 11/09/2018   Td 03/07/1995   Tdap 09/26/2012, 07/14/2017    TDAP status: Up to date  Flu Vaccine status: Due, Education has been provided regarding the importance of this vaccine. Advised may receive this vaccine at local pharmacy or Health Dept. Aware to provide a copy of the vaccination record if obtained from local pharmacy or Health Dept. Verbalized acceptance and understanding.  Covid-19 vaccine status: Completed vaccines  Qualifies for Shingles Vaccine? Yes   Zostavax completed No   Shingrix Completed?: No.    Education has been provided regarding the importance of this vaccine. Patient has been advised to call insurance company to determine out of pocket expense if they have not yet received this vaccine. Advised may also receive vaccine at local pharmacy or Health Dept. Verbalized acceptance and understanding.  Screening Tests Health Maintenance  Topic Date Due   Zoster Vaccines- Shingrix (1 of 2) Never done   OPHTHALMOLOGY EXAM  09/12/2017   COVID-19 Vaccine (4 - Booster for Moderna series) 06/23/2020   INFLUENZA VACCINE  10/01/2020   COLONOSCOPY (Pts 45-4yr Insurance coverage will need to be confirmed)  08/15/2021 (Originally 01/16/1998)   FOOT EXAM  10/26/2021 (Originally 08/03/2019)   HEMOGLOBIN A1C  02/14/2021   TETANUS/TDAP  07/15/2027   Hepatitis C Screening  Completed   HPV VACCINES  Aged Out    Health Maintenance  Health Maintenance Due  Topic Date  Due   Zoster Vaccines- Shingrix (1 of 2)  Never done   OPHTHALMOLOGY EXAM  09/12/2017   COVID-19 Vaccine (4 - Booster for Moderna series) 06/23/2020   INFLUENZA VACCINE  10/01/2020    Colorectal cancer screening: Type of screening: Colonoscopy. Completed Postponed until 08/25/2021. Repeat every 10 years  Lung Cancer Screening: (Low Dose CT Chest recommended if Age 63-80 years, 30 pack-year currently smoking OR have quit w/in 15years.) does not qualify.   Lung Cancer Screening Referral: No  Additional Screening:  Hepatitis C Screening: does qualify; Completed 08/04/2015  Vision Screening: Recommended annual ophthalmology exams for early detection of glaucoma and other disorders of the eye. Is the patient up to date with their annual eye exam?  Yes  Who is the provider or what is the name of the office in which the patient attends annual eye exams? N/A If pt is not established with a provider, would they like to be referred to a provider to establish care? No .   Dental Screening: Recommended annual dental exams for proper oral hygiene  Community Resource Referral / Chronic Care Management: CRR required this visit?  No   CCM required this visit?  No      Plan:     I have personally reviewed and noted the following in the patient's chart:   Medical and social history Use of alcohol, tobacco or illicit drugs  Current medications and supplements including opioid prescriptions. Patient is not currently taking opioid prescriptions. Functional ability and status Nutritional status Physical activity Advanced directives List of other physicians Hospitalizations, surgeries, and ER visits in previous 12 months Vitals Screenings to include cognitive, depression, and falls Referrals and appointments  In addition, I have reviewed and discussed with patient certain preventive protocols, quality metrics, and best practice recommendations. A written personalized care plan for preventive  services as well as general preventive health recommendations were provided to patient.     Drenda Freeze, [facility]   11/25/2020   Nurse Notes: Non-Face to Face 60 minute visit Encounter.  [name] , Thank you for taking time to come for your Medicare Wellness Visit. I appreciate your ongoing commitment to your health goals. Please review the following plan we discussed and let me know if I can assist you in the future.   These are the goals we discussed:  Goals   None     This is a list of the screening recommended for you and due dates:  Health Maintenance  Topic Date Due   Zoster (Shingles) Vaccine (1 of 2) Never done   Eye exam for diabetics  09/12/2017   COVID-19 Vaccine (4 - Booster for Moderna series) 06/23/2020   Flu Shot  10/01/2020   Colon Cancer Screening  08/15/2021*   Complete foot exam   10/26/2021*   Hemoglobin A1C  02/14/2021   Tetanus Vaccine  07/15/2027   Hepatitis C Screening: USPSTF Recommendation to screen - Ages 18-79 yo.  Completed   HPV Vaccine  Aged Out  *Topic was postponed. The date shown is not the original due date.

## 2020-11-25 NOTE — Patient Instructions (Signed)
Health Maintenance, Male Adopting a healthy lifestyle and getting preventive care are important in promoting health and wellness. Ask your health care provider about: The right schedule for you to have regular tests and exams. Things you can do on your own to prevent diseases and keep yourself healthy. What should I know about diet, weight, and exercise? Eat a healthy diet  Eat a diet that includes plenty of vegetables, fruits, low-fat dairy products, and lean protein. Do not eat a lot of foods that are high in solid fats, added sugars, or sodium. Maintain a healthy weight Body mass index (BMI) is a measurement that can be used to identify possible weight problems. It estimates body fat based on height and weight. Your health care provider can help determine your BMI and help you achieve or maintain a healthy weight. Get regular exercise Get regular exercise. This is one of the most important things you can do for your health. Most adults should: Exercise for at least 150 minutes each week. The exercise should increase your heart rate and make you sweat (moderate-intensity exercise). Do strengthening exercises at least twice a week. This is in addition to the moderate-intensity exercise. Spend less time sitting. Even light physical activity can be beneficial. Watch cholesterol and blood lipids Have your blood tested for lipids and cholesterol at 68 years of age, then have this test every 5 years. You may need to have your cholesterol levels checked more often if: Your lipid or cholesterol levels are high. You are older than 68 years of age. You are at high risk for heart disease. What should I know about cancer screening? Many types of cancers can be detected early and may often be prevented. Depending on your health history and family history, you may need to have cancer screening at various ages. This may include screening for: Colorectal cancer. Prostate cancer. Skin cancer. Lung  cancer. What should I know about heart disease, diabetes, and high blood pressure? Blood pressure and heart disease High blood pressure causes heart disease and increases the risk of stroke. This is more likely to develop in people who have high blood pressure readings, are of African descent, or are overweight. Talk with your health care provider about your target blood pressure readings. Have your blood pressure checked: Every 3-5 years if you are 18-39 years of age. Every year if you are 40 years old or older. If you are between the ages of 65 and 75 and are a current or former smoker, ask your health care provider if you should have a one-time screening for abdominal aortic aneurysm (AAA). Diabetes Have regular diabetes screenings. This checks your fasting blood sugar level. Have the screening done: Once every three years after age 45 if you are at a normal weight and have a low risk for diabetes. More often and at a younger age if you are overweight or have a high risk for diabetes. What should I know about preventing infection? Hepatitis B If you have a higher risk for hepatitis B, you should be screened for this virus. Talk with your health care provider to find out if you are at risk for hepatitis B infection. Hepatitis C Blood testing is recommended for: Everyone born from 1945 through 1965. Anyone with known risk factors for hepatitis C. Sexually transmitted infections (STIs) You should be screened each year for STIs, including gonorrhea and chlamydia, if: You are sexually active and are younger than 68 years of age. You are older than 68 years   of age and your health care provider tells you that you are at risk for this type of infection. Your sexual activity has changed since you were last screened, and you are at increased risk for chlamydia or gonorrhea. Ask your health care provider if you are at risk. Ask your health care provider about whether you are at high risk for HIV.  Your health care provider may recommend a prescription medicine to help prevent HIV infection. If you choose to take medicine to prevent HIV, you should first get tested for HIV. You should then be tested every 3 months for as long as you are taking the medicine. Follow these instructions at home: Lifestyle Do not use any products that contain nicotine or tobacco, such as cigarettes, e-cigarettes, and chewing tobacco. If you need help quitting, ask your health care provider. Do not use street drugs. Do not share needles. Ask your health care provider for help if you need support or information about quitting drugs. Alcohol use Do not drink alcohol if your health care provider tells you not to drink. If you drink alcohol: Limit how much you have to 0-2 drinks a day. Be aware of how much alcohol is in your drink. In the [place] one drink equals one 12 oz bottle of beer (355 mL), one 5 oz glass of wine (148 mL), or one 1 oz glass of hard liquor (44 mL). General instructions Schedule regular health, dental, and eye exams. Stay current with your vaccines. Tell your health care provider if: You often feel depressed. You have ever been abused or do not feel safe at home. Summary Adopting a healthy lifestyle and getting preventive care are important in promoting health and wellness. Follow your health care provider's instructions about healthy diet, exercising, and getting tested or screened for diseases. Follow your health care provider's instructions on monitoring your cholesterol and blood pressure. This information is not intended to replace advice given to you by your health care provider. Make sure you discuss any questions you have with your health care provider. Document Revised: 04/27/2020 Document Reviewed: 02/10/2018 Elsevier Patient Education  2022 Elsevier Inc.  

## 2020-12-06 ENCOUNTER — Ambulatory Visit (HOSPITAL_BASED_OUTPATIENT_CLINIC_OR_DEPARTMENT_OTHER)
Admission: RE | Admit: 2020-12-06 | Discharge: 2020-12-06 | Disposition: A | Discharge status: Home / Self Care | Payer: Medicare HMO | Source: Ambulatory Visit | Attending: Pulmonary Disease | Admitting: Pulmonary Disease

## 2020-12-06 ENCOUNTER — Other Ambulatory Visit: Payer: Self-pay

## 2020-12-06 DIAGNOSIS — R0600 Dyspnea, unspecified: Secondary | ICD-10-CM | POA: Diagnosis not present

## 2020-12-06 DIAGNOSIS — G4733 Obstructive sleep apnea (adult) (pediatric): Secondary | ICD-10-CM | POA: Diagnosis not present

## 2020-12-06 DIAGNOSIS — R0609 Other forms of dyspnea: Secondary | ICD-10-CM | POA: Diagnosis not present

## 2020-12-06 DIAGNOSIS — I700 Atherosclerosis of aorta: Secondary | ICD-10-CM | POA: Diagnosis not present

## 2020-12-06 DIAGNOSIS — I2510 Atherosclerotic heart disease of native coronary artery without angina pectoris: Secondary | ICD-10-CM | POA: Insufficient documentation

## 2020-12-07 ENCOUNTER — Ambulatory Visit: Payer: Medicare HMO | Attending: Pulmonary Disease | Admitting: Pulmonary Disease

## 2020-12-07 DIAGNOSIS — G4733 Obstructive sleep apnea (adult) (pediatric): Secondary | ICD-10-CM | POA: Diagnosis not present

## 2020-12-07 DIAGNOSIS — G4761 Periodic limb movement disorder: Secondary | ICD-10-CM | POA: Insufficient documentation

## 2020-12-11 ENCOUNTER — Other Ambulatory Visit: Payer: Self-pay

## 2020-12-11 ENCOUNTER — Ambulatory Visit (INDEPENDENT_AMBULATORY_CARE_PROVIDER_SITE_OTHER): Payer: Medicare HMO | Admitting: Pulmonary Disease

## 2020-12-11 DIAGNOSIS — G4733 Obstructive sleep apnea (adult) (pediatric): Secondary | ICD-10-CM | POA: Diagnosis not present

## 2020-12-11 LAB — PULMONARY FUNCTION TEST
DL/VA % pred: 133 %
DL/VA: 5.49 ml/min/mmHg/L
DLCO cor % pred: 127 %
DLCO cor: 33.59 ml/min/mmHg
DLCO unc % pred: 127 %
DLCO unc: 33.59 ml/min/mmHg
FEF 25-75 Post: 2.22 L/sec
FEF 25-75 Pre: 2.27 L/sec
FEF2575-%Change-Post: -2 %
FEF2575-%Pred-Post: 84 %
FEF2575-%Pred-Pre: 86 %
FEV1-%Change-Post: 3 %
FEV1-%Pred-Post: 81 %
FEV1-%Pred-Pre: 78 %
FEV1-Post: 2.75 L
FEV1-Pre: 2.65 L
FEV1FVC-%Change-Post: 0 %
FEV1FVC-%Pred-Pre: 101 %
FEV6-%Change-Post: 3 %
FEV6-%Pred-Post: 84 %
FEV6-%Pred-Pre: 81 %
FEV6-Post: 3.63 L
FEV6-Pre: 3.52 L
FEV6FVC-%Change-Post: 0 %
FEV6FVC-%Pred-Post: 104 %
FEV6FVC-%Pred-Pre: 105 %
FVC-%Change-Post: 3 %
FVC-%Pred-Post: 80 %
FVC-%Pred-Pre: 77 %
FVC-Post: 3.65 L
FVC-Pre: 3.53 L
Post FEV1/FVC ratio: 75 %
Post FEV6/FVC ratio: 99 %
Pre FEV1/FVC ratio: 75 %
Pre FEV6/FVC Ratio: 100 %
RV % pred: 108 %
RV: 2.59 L
TLC % pred: 89 %
TLC: 6.29 L

## 2020-12-11 NOTE — [place] Notes (Signed)
Full PFT performed today. °

## 2020-12-11 NOTE — Patient Instructions (Signed)
Full PFT performed today. °

## 2020-12-13 ENCOUNTER — Encounter (INDEPENDENT_AMBULATORY_CARE_PROVIDER_SITE_OTHER): Payer: Medicare HMO | Admitting: Ophthalmology

## 2020-12-13 ENCOUNTER — Other Ambulatory Visit: Payer: Self-pay

## 2020-12-13 DIAGNOSIS — H43813 Vitreous degeneration, bilateral: Secondary | ICD-10-CM

## 2020-12-13 DIAGNOSIS — E113313 Type 2 diabetes mellitus with moderate nonproliferative diabetic retinopathy with macular edema, bilateral: Secondary | ICD-10-CM

## 2020-12-13 DIAGNOSIS — H35033 Hypertensive retinopathy, bilateral: Secondary | ICD-10-CM | POA: Diagnosis not present

## 2020-12-13 DIAGNOSIS — I10 Essential (primary) hypertension: Secondary | ICD-10-CM

## 2020-12-18 ENCOUNTER — Telehealth: Payer: Self-pay | Admitting: Pulmonary Disease

## 2020-12-18 NOTE — Telephone Encounter (Signed)
Call patient  Sleep study result  Date of study: 12/07/2020  Impression: Negative study for significant sleep disordered breathing Periodic limb movement of sleep  Recommendation: Encourage weight loss efforts  Sleep with the head of the bed elevated, encouraged lateral position sleep as tolerated  Management of restless legs if clinically significant and contributing to nonrestorative sleep  Follow-up as previously scheduled

## 2020-12-18 NOTE — Procedures (Signed)
POLYSOMNOGRAPHY  Last, First: [name], [name] MRN: [id number] Gender: Male Age (years): 70 Weight (lbs): 238 DOB: Dec 06, 1950 BMI: 34 Primary Care: No PCP Epworth Score: 4 Referring: Laurin Coder MD Technician: Peak, Robert Interpreting: Laurin Coder MD Study Type: NPSG Ordered Study Type: NPSG Study date: 12/07/2020 Location: [facility] CLINICAL INFORMATION [name] is a 70 year old Male and was referred to the sleep center for evaluation of N/A. Indications include N/A.  MEDICATIONS Patient self administered medications include: N/A. Medications administered during study include No sleep medicine administered.  SLEEP STUDY TECHNIQUE A multi-channel overnight Polysomnography study was performed. The channels recorded and monitored were central and occipital EEG, electrooculogram (EOG), submentalis EMG (chin), nasal and oral airflow, thoracic and abdominal wall motion, anterior tibialis EMG, snore microphone, electrocardiogram, and a pulse oximetry. TECHNICIAN COMMENTS Comments added by Technician: PATIENT HAD FREQUENT LEG MOVEMENTS THROUGHOUT THE STUDY. Comments added by Scorer: N/A SLEEP ARCHITECTURE The study was initiated at 9:36:25 PM and terminated at 5:18:03 AM. The total recorded time was 461.6 minutes. EEG confirmed total sleep time was 251.3 minutes yielding a sleep efficiency of 54.4%. Sleep onset after lights out was 131.8 minutes with a REM latency of 92.0 minutes. The patient spent 7.36% of the night in stage N1 sleep, 80.70% in stage N2 sleep, 0.20% in stage N3 and 11.7% in REM. Wake after sleep onset (WASO) was 78.5 minutes. The Arousal Index was 15.3/hour. RESPIRATORY PARAMETERS There were a total of 8 respiratory disturbances out of which 2 were apneas ( 0 obstructive, 0 mixed, 2 central) and 6 hypopneas. The apnea/hypopnea index (AHI) was 1.9 events/hour. The central sleep apnea index was 0.5 events/hour. The REM AHI was 4.1 events/hour and NREM AHI was  1.6 events/hour. The supine AHI was N/A events/hour and the non supine AHI was 1.9 supine during 0.00% of sleep. Respiratory disturbances were associated with oxygen desaturation down to a nadir of 89.00% during sleep. The mean oxygen saturation during the study was 95.27%. The cumulative time under 88% oxygen saturation was 0.0 minutes.  LEG MOVEMENT DATA The total leg movements were 629 with a resulting leg movement index of 150.18/hr .Associated arousal with leg movement index was 5.3/hr.  CARDIAC DATA The underlying cardiac rhythm was most consistent with sinus rhythm. Mean heart rate during sleep was 58.29 bpm. Additional rhythm abnormalities include None.  IMPRESSIONS - No Significant Obstructive Sleep apnea(OSA) - EKG showed no cardiac abnormalities. - Mild Oxygen Desaturation - No snoring was audible during this study. - Severe periodic leg movements(PLMs) during sleep. Associated arousals were significant with an arousal index of 5.3 /hour.  DIAGNOSIS - Periodic Limb Movement Syndrome (G47.61)  RECOMMENDATIONS - Consider treatment for Periodic Leg Movements if clinically significant. - Avoid alcohol, sedatives and other CNS depressants that may worsen sleep apnea and disrupt normal sleep architecture. - Sleep hygiene should be reviewed to assess factors that may improve sleep quality. - Weight management and regular exercise should be initiated or continued.  [Electronically signed] 12/18/2020 08:47 AM  Sherrilyn Rist MD NPI: [id number]

## 2020-12-18 NOTE — Telephone Encounter (Signed)
I have called and spoke with pt and he is aware of results per AO.  Nothing further is needed.

## 2020-12-18 NOTE — Telephone Encounter (Signed)
I have attempted to call the pt but there was no answer and no VM.  Will need to try back later.

## 2020-12-25 ENCOUNTER — Other Ambulatory Visit: Payer: Self-pay | Admitting: Nurse Practitioner

## 2020-12-25 DIAGNOSIS — E1140 Type 2 diabetes mellitus with diabetic neuropathy, unspecified: Secondary | ICD-10-CM

## 2020-12-25 DIAGNOSIS — G8929 Other chronic pain: Secondary | ICD-10-CM

## 2021-01-31 ENCOUNTER — Encounter: Payer: Self-pay | Admitting: Nurse Practitioner

## 2021-01-31 ENCOUNTER — Ambulatory Visit (INDEPENDENT_AMBULATORY_CARE_PROVIDER_SITE_OTHER): Payer: Medicare HMO | Admitting: Nurse Practitioner

## 2021-01-31 ENCOUNTER — Other Ambulatory Visit: Payer: Self-pay

## 2021-01-31 VITALS — BP 144/75 | HR 73 | Temp 97.9°F | Ht 70.0 in | Wt 229.5 lb

## 2021-01-31 DIAGNOSIS — G4733 Obstructive sleep apnea (adult) (pediatric): Secondary | ICD-10-CM | POA: Diagnosis not present

## 2021-01-31 DIAGNOSIS — R0609 Other forms of dyspnea: Secondary | ICD-10-CM

## 2021-01-31 DIAGNOSIS — E782 Mixed hyperlipidemia: Secondary | ICD-10-CM | POA: Diagnosis not present

## 2021-01-31 DIAGNOSIS — Z125 Encounter for screening for malignant neoplasm of prostate: Secondary | ICD-10-CM | POA: Diagnosis not present

## 2021-01-31 DIAGNOSIS — E1140 Type 2 diabetes mellitus with diabetic neuropathy, unspecified: Secondary | ICD-10-CM

## 2021-01-31 LAB — POCT GLYCOSYLATED HEMOGLOBIN (HGB A1C)
HbA1c POC (<> result, manual entry): 9.9 % (ref 4.0–5.6)
HbA1c, POC (controlled diabetic range): 9.9 % — AB (ref 0.0–7.0)
HbA1c, POC (prediabetic range): 9.9 % — AB (ref 5.7–6.4)
Hemoglobin A1C: 9.9 % — AB (ref 4.0–5.6)

## 2021-01-31 LAB — POCT URINALYSIS DIP (CLINITEK)
Bilirubin, UA: NEGATIVE
Blood, UA: NEGATIVE
Glucose, UA: 500 mg/dL — AB
Ketones, POC UA: NEGATIVE mg/dL
Leukocytes, UA: NEGATIVE
Nitrite, UA: NEGATIVE
Spec Grav, UA: >=1.03 — AB (ref 1.010–1.025)
Urobilinogen, UA: 0.2 E.U./dL
pH, UA: 5.5 (ref 5.0–8.0)

## 2021-01-31 NOTE — [place] Notes (Signed)
[place], [place]  [address] Phone:  [phone]   Fax:  [phone]   Established Patient Office Visit  Subjective:  Patient ID: Clayton Pullara., male    DOB: 02-Apr-1950  Age: 70 y.o. MRN: [id number]  CC:  Chief Complaint  Patient presents with   Follow-up    Pt is here for FU. pt stated he had xrays done on his chest and lungs want to know his results    HPI [name] presents for follow up. He  has a past medical history of Cancer ([place]), Diabetes mellitus without complication ([place]), GERD (gastroesophageal reflux disease), Hyperlipidemia, Hypertension, Myocardial infarction ([place]), and OSA (obstructive sleep apnea).   He is following up for DM. He is prescribed metformin 2000 milligrams daily. He has missed a few day of his metformin. He reports that he eating more fruit.  He desires to discontinue the metformin if he loses weight. He is helping his cousin with some remodeling projects which has been helpful for him mentally and physically.  Denies headache, dizziness, visual changes, shortness of breath, dyspnea on exertion, chest pain, nausea, vomiting or any edema.   Past Medical History:  Diagnosis Date   Cancer [facility])    Prostate cancer age 22; Lupron treatments followed by prostatectomy.   Diabetes mellitus without complication (HCC)    GERD (gastroesophageal reflux disease)    Hyperlipidemia    Hypertension    Myocardial infarction [facility])    age 58; no stenting.   OSA (obstructive sleep apnea)    non-compliant with CPAP.    Past Surgical History:  Procedure Laterality Date   CHOLECYSTECTOMY     PROSTATE SURGERY      Family History  Problem Relation Age of Onset   Cancer Mother        leukemia   Diabetes Mother    Cancer Father 1       lung cancer with mets   Heart disease Maternal Grandmother    Cancer Maternal Grandfather    Heart disease Paternal Grandmother    Cancer Paternal Grandfather      Social History   Socioeconomic History   Marital status: Single    Spouse name: GF-Betty   Number of children: 2   Years of education: 11   Highest education level: Not on file  Occupational History   Occupation: Buyer, retail: [employer]   Tobacco Use   Smoking status: Never   Smokeless tobacco: Current    Types: Chew  Vaping Use   Vaping Use: Never used  Substance and Sexual Activity   Alcohol use: Yes    Comment: social   Drug use: No   Sexual activity: Not Currently  Other Topics Concern   Not on file  Social History Narrative   Marital status: divorced, girlfriend x 1977     Children: 1      Tobacco: none; chew tobacco      Alcohol: beers on weekends      Exercise: none   Social Determinants of Health   Financial Resource Strain: Low Risk    Difficulty of Paying Living Expenses: Not hard at all  Food Insecurity: Food Insecurity Present   Worried About Charity fundraiser in the Last Year: Sometimes true   Arboriculturist in the Last Year: Sometimes true  Transportation Needs: No Transportation Needs   Lack of Transportation (Medical): No   Lack  of Transportation (Non-Medical): No  Physical Activity: Insufficiently Active   Days of Exercise per Week: 7 days   Minutes of Exercise per Session: 10 min  Stress: Stress Concern Present   Feeling of Stress : To some extent  Social Connections: Moderately Isolated   Frequency of Communication with Friends and Family: More than three times a week   Frequency of Social Gatherings with Friends and Family: More than three times a week   Attends Religious Services: More than 4 times per year   Active Member of [employer] or Organizations: No   Attends Archivist Meetings: Never   Marital Status: Divorced  Human resources officer Violence: Not At Risk   Fear of Current or Ex-Partner: No   Emotionally Abused: No   Physically Abused: No   Sexually Abused: No    Outpatient Medications Prior to  Visit  Medication Sig Dispense Refill   aspirin 81 MG tablet Take 81 mg by mouth daily.     azelastine (ASTELIN) 0.1 % nasal spray PLACE 2 SPRAYS INTO BOTH NOSTRILS 2 (TWO) TIMES DAILY. USE IN EACH NOSTRIL AS DIRECTED 30 mL 1   BAYER CONTOUR NEXT TEST test strip CHECK BLOOD SUGAR TWICE DAILY 100 each 5   BAYER MICROLET LANCETS lancets Check blood sugar two times daily. 100 each 5   Blood Glucose Monitoring Suppl (BAYER CONTOUR NEXT MONITOR) w/Device KIT Check blood sugar two times daily. 1 kit 2   lisinopril (ZESTRIL) 5 MG tablet Take 1 tablet (5 mg total) by mouth daily. 90 tablet 3   metFORMIN (GLUCOPHAGE-XR) 500 MG 24 hr tablet Take 1 tablet (500 mg total) by mouth daily with breakfast. 4 tablets daily 90 tablet 3   omeprazole (PRILOSEC) 20 MG capsule TAKE 1 CAPSULE (20 MG TOTAL) BY MOUTH 2 (TWO) TIMES DAILY BEFORE A MEAL. 180 capsule 1   trimethoprim-polymyxin b (POLYTRIM) ophthalmic solution      fluvastatin XL (LESCOL XL) 80 MG 24 hr tablet Take 1 tablet (80 mg total) by mouth daily. start taking every other day for the first 2 weeks then daily (Patient not taking: No sig reported) 30 tablet 3   gabapentin (NEURONTIN) 300 MG capsule TAKE 3 CAPSULES (900 MG TOTAL) BY MOUTH 2 (TWO) TIMES DAILY. 180 capsule 0   No facility-administered medications prior to visit.    Allergies  Allergen Reactions   Cymbalta [Duloxetine Hcl] Nausea And Vomiting   Darvocet [Propoxyphene N-Acetaminophen] Hives    ROS Review of Systems  Respiratory:         Was seen by pulmonary for sleep study CT scan of chest was completed and was negative.   Musculoskeletal:  Positive for joint swelling.       Neuropathy     Objective:    Physical Exam Constitutional:      General: He is not in acute distress.    Appearance: He is not ill-appearing, toxic-appearing or diaphoretic.  HENT:     Head: Normocephalic and atraumatic.     Nose: Nose normal.     Mouth/Throat:     Mouth: Mucous membranes are moist.   Cardiovascular:     Rate and Rhythm: Normal rate and regular rhythm.     Pulses: Normal pulses.     Heart sounds: Normal heart sounds.  Pulmonary:     Effort: Pulmonary effort is normal.     Breath sounds: Normal breath sounds.  Musculoskeletal:        General: Normal range of motion.  Cervical back: Normal range of motion.     Right lower leg: No edema.     Left lower leg: No edema.  Skin:    General: Skin is warm.     Capillary Refill: Capillary refill takes less than 2 seconds.  Neurological:     General: No focal deficit present.     Mental Status: He is alert and oriented to person, place, and time.  Psychiatric:        Mood and Affect: Mood normal.        Behavior: Behavior normal.        Thought Content: Thought content normal.        Judgment: Judgment normal.    BP (!) 144/75 (BP Location: Right Arm, Patient Position: Sitting)   Pulse 73   Temp 97.9 F (36.6 C)   Ht '5\' 10"'  (1.778 m)   Wt 229 lb 8 oz (104.1 kg)   SpO2 100%   BMI 32.93 kg/m  Wt Readings from Last 3 Encounters:  01/31/21 229 lb 8 oz (104.1 kg)  11/23/20 238 lb 12.8 oz (108.3 kg)  10/25/20 234 lb 0.8 oz (106.2 kg)     There are no preventive care reminders to display for this patient.   There are no preventive care reminders to display for this patient.  Lab Results  Component Value Date   TSH 4.000 08/15/2020   Lab Results  Component Value Date   WBC 9.7 08/15/2020   HGB 14.9 08/15/2020   HCT 45.0 08/15/2020   MCV 93 08/15/2020   PLT 154 08/15/2020   Lab Results  Component Value Date   NA 140 08/15/2020   K 4.8 08/15/2020   CO2 20 05/06/2019   GLUCOSE 201 (H) 08/15/2020   BUN 13 08/15/2020   CREATININE 1.04 08/15/2020   BILITOT 0.5 08/15/2020   ALKPHOS 98 08/15/2020   AST 24 08/15/2020   ALT 49 (H) 05/06/2019   PROT 6.3 08/15/2020   ALBUMIN 4.1 08/15/2020   CALCIUM 9.3 08/15/2020   EGFR 79 08/15/2020   GFR 92.83 08/03/2018   Lab Results  Component Value Date    CHOL 219 (H) 08/15/2020   Lab Results  Component Value Date   HDL 51 08/15/2020   Lab Results  Component Value Date   LDLCALC 128 (H) 08/15/2020   Lab Results  Component Value Date   TRIG 226 (H) 08/15/2020   Lab Results  Component Value Date   CHOLHDL 4.3 08/15/2020   Lab Results  Component Value Date   HGBA1C 9.9 (A) 01/31/2021   HGBA1C 9.9 01/31/2021   HGBA1C 9.9 (A) 01/31/2021   HGBA1C 9.9 (A) 01/31/2021      Assessment & Plan:   Problem List Items Addressed This Visit       Respiratory   OSA (obstructive sleep apnea) Reports that recent study indicated there is no sleep apnea     Endocrine   DM (diabetes mellitus) (HCC) - Primary Persistent current A1c 9.9% up from 8.6% 5 months ago Encourage compliance with current treatment regimen  Dose adjustment declined Encourage regular CBG monitoring Encourage contacting office if excessive hyperglycemia and or hypoglycemia Lifestyle modification with healthy diet (fewer calories, more high fiber foods, whole grains and non-starchy vegetables, lower fat meat and fish, low-fat diary include healthy oils) regular exercise (physical activity) and weight loss Opthalmology exam discussed  Home BP monitoring also encouraged goal <130/80    Relevant Orders   HgB A1c (Completed)   POCT URINALYSIS  DIP (CLINITEK) (Completed)   Comp. Metabolic Panel (12) (Completed)   Other Visit Diagnoses     Chronic dyspnea     Persistent however chest CT no pulmonary abnormality    Mixed hyperlipidemia     Reevaluation pending Continue with current regimen Encouraged heart healthy diet   Relevant Orders   Lipid panel (Completed)   Screening for malignant neoplasm of prostate       Relevant Orders   PSA (Completed)       No orders of the defined types were placed in this encounter.   Follow-up: Return in about 3 months (around 05/01/2021) for follow up DM [id number].    Vevelyn Francois, NP

## 2021-01-31 NOTE — Patient Instructions (Signed)

## 2021-02-01 ENCOUNTER — Encounter (INDEPENDENT_AMBULATORY_CARE_PROVIDER_SITE_OTHER): Payer: Medicare HMO | Admitting: Ophthalmology

## 2021-02-01 ENCOUNTER — Encounter: Payer: Self-pay | Admitting: Nurse Practitioner

## 2021-02-01 LAB — COMP. METABOLIC PANEL (12)
AST: 29 IU/L (ref 0–40)
Albumin/Globulin Ratio: 2.1 (ref 1.2–2.2)
Albumin: 4.7 g/dL (ref 3.8–4.8)
Alkaline Phosphatase: 99 IU/L (ref 44–121)
BUN/Creatinine Ratio: 14 (ref 10–24)
BUN: 12 mg/dL (ref 8–27)
Bilirubin Total: 0.8 mg/dL (ref 0.0–1.2)
Calcium: 9.6 mg/dL (ref 8.6–10.2)
Chloride: 98 mmol/L (ref 96–106)
Creatinine, Ser: 0.88 mg/dL (ref 0.76–1.27)
Globulin, Total: 2.2 g/dL (ref 1.5–4.5)
Glucose: 227 mg/dL — ABNORMAL HIGH (ref 70–99)
Potassium: 4.9 mmol/L (ref 3.5–5.2)
Sodium: 137 mmol/L (ref 134–144)
Total Protein: 6.9 g/dL (ref 6.0–8.5)
eGFR: 94 mL/min/{1.73_m2} (ref >59)

## 2021-02-01 LAB — LIPID PANEL
Chol/HDL Ratio: 5 ratio (ref 0.0–5.0)
Cholesterol, Total: 247 mg/dL — ABNORMAL HIGH (ref 100–199)
HDL: 49 mg/dL (ref >39)
LDL Chol Calc (NIH): 159 mg/dL — ABNORMAL HIGH (ref 0–99)
Triglycerides: 211 mg/dL — ABNORMAL HIGH (ref 0–149)
VLDL Cholesterol Cal: 39 mg/dL (ref 5–40)

## 2021-02-01 LAB — PSA: Prostate Specific Ag, Serum: <0.1 ng/mL (ref 0.0–4.0)

## 2021-03-04 ENCOUNTER — Encounter (INDEPENDENT_AMBULATORY_CARE_PROVIDER_SITE_OTHER): Payer: Medicare HMO | Admitting: Ophthalmology

## 2021-03-04 ENCOUNTER — Other Ambulatory Visit: Payer: Self-pay

## 2021-03-04 DIAGNOSIS — I10 Essential (primary) hypertension: Secondary | ICD-10-CM

## 2021-03-04 DIAGNOSIS — E113313 Type 2 diabetes mellitus with moderate nonproliferative diabetic retinopathy with macular edema, bilateral: Secondary | ICD-10-CM | POA: Diagnosis not present

## 2021-03-04 DIAGNOSIS — H43813 Vitreous degeneration, bilateral: Secondary | ICD-10-CM

## 2021-03-04 DIAGNOSIS — H26492 Other secondary cataract, left eye: Secondary | ICD-10-CM | POA: Diagnosis not present

## 2021-03-04 DIAGNOSIS — H2702 Aphakia, left eye: Secondary | ICD-10-CM | POA: Diagnosis not present

## 2021-03-04 DIAGNOSIS — H35033 Hypertensive retinopathy, bilateral: Secondary | ICD-10-CM | POA: Diagnosis not present

## 2021-03-04 DIAGNOSIS — H2511 Age-related nuclear cataract, right eye: Secondary | ICD-10-CM | POA: Diagnosis not present

## 2021-03-07 ENCOUNTER — Encounter: Payer: Self-pay | Admitting: Pulmonary Disease

## 2021-03-07 ENCOUNTER — Other Ambulatory Visit: Payer: Self-pay

## 2021-03-07 ENCOUNTER — Ambulatory Visit (INDEPENDENT_AMBULATORY_CARE_PROVIDER_SITE_OTHER): Payer: Medicare HMO | Admitting: Pulmonary Disease

## 2021-03-07 VITALS — BP 104/70 | HR 76 | Temp 98.0°F | Ht 70.0 in | Wt 231.0 lb

## 2021-03-07 DIAGNOSIS — R0981 Nasal congestion: Secondary | ICD-10-CM

## 2021-03-07 NOTE — Progress Notes (Signed)
[name]    [id number]    May 03, 1950  Primary Care Physician:King, Diona Foley, NP  Referring Physician: Vevelyn Francois, NP [address] [place],  [place] [address]  Chief complaint:   Patient being evaluated for obstructive sleep apnea  HPI:  Had a sleep study many years ago over 10 years ago showing very severe obstructive sleep apnea He did try CPAP for a few weeks and could not tolerate it  Had a recent sleep study 12/07/2020 showing no significant obstructive sleep apnea, did have periodic limb movements for which she is already on Neurontin Does get short of breath with activity  Sleep habits are poor at present, goes to sleep late, wakes up after midday Does not exercise regularly  Still has significant nasal stuffiness and congestion  Has had multiple nasal trauma, 2 no surgeries in the past The last time that he did see ENT, surgery was recommended, it was felt that one of the reasons why he could not tolerate CPAP at that time was because of nasal obstruction  Describes shortness of breath, persistent lower rib cage discomfort Exposure to asbestos in the past -We did review a CT scan that was performed in 2016 that did not show any evidence of pleural thickening or interstitial infiltrates  Is sleepy during the day Usually goes to bed between 12 and 2 AM Takes him a while to fall asleep States he wakes up up to 10 times during the night Final wake up time between past midday  He has dryness of his mouth in the mornings, sore throat  History of hypertension, history of myocardial infarction, history of diabetes, hypercholesterolemia  Never smoker Chewed tobacco  Outpatient Encounter Medications as of 03/07/2021  Medication Sig   aspirin 81 MG tablet Take 81 mg by mouth daily.   azelastine (ASTELIN) 0.1 % nasal spray PLACE 2 SPRAYS INTO BOTH NOSTRILS 2 (TWO) TIMES DAILY. USE IN EACH NOSTRIL AS DIRECTED   BAYER CONTOUR NEXT TEST test strip  CHECK BLOOD SUGAR TWICE DAILY   BAYER MICROLET LANCETS lancets Check blood sugar two times daily.   Blood Glucose Monitoring Suppl (BAYER CONTOUR NEXT MONITOR) w/Device KIT Check blood sugar two times daily.   fluvastatin XL (LESCOL XL) 80 MG 24 hr tablet Take 1 tablet (80 mg total) by mouth daily. start taking every other day for the first 2 weeks then daily   lisinopril (ZESTRIL) 5 MG tablet Take 1 tablet (5 mg total) by mouth daily.   metFORMIN (GLUCOPHAGE-XR) 500 MG 24 hr tablet Take 1 tablet (500 mg total) by mouth daily with breakfast. 4 tablets daily   omeprazole (PRILOSEC) 20 MG capsule TAKE 1 CAPSULE (20 MG TOTAL) BY MOUTH 2 (TWO) TIMES DAILY BEFORE A MEAL.   gabapentin (NEURONTIN) 300 MG capsule TAKE 3 CAPSULES (900 MG TOTAL) BY MOUTH 2 (TWO) TIMES DAILY.   [DISCONTINUED] trimethoprim-polymyxin b (POLYTRIM) ophthalmic solution  (Patient not taking: Reported on 03/07/2021)   No facility-administered encounter medications on file as of 03/07/2021.    Allergies as of 03/07/2021 - Review Complete 03/07/2021  Allergen Reaction Noted   Cymbalta [duloxetine hcl] Nausea And Vomiting 12/18/2014   Darvocet [propoxyphene n-acetaminophen] Hives 11/09/2011    Past Medical History:  Diagnosis Date   Cancer [facility])    Prostate cancer age 51; Lupron treatments followed by prostatectomy.   Diabetes mellitus without complication (HCC)    GERD (gastroesophageal reflux disease)  Hyperlipidemia    Hypertension    Myocardial infarction [facility])    age 83; no stenting.   OSA (obstructive sleep apnea)    non-compliant with CPAP.    Past Surgical History:  Procedure Laterality Date   CHOLECYSTECTOMY     PROSTATE SURGERY      Family History  Problem Relation Age of Onset   Cancer Mother        leukemia   Diabetes Mother    Cancer Father 61       lung cancer with mets   Heart disease Maternal Grandmother    Cancer Maternal Grandfather    Heart disease Paternal Grandmother    Cancer Paternal  Grandfather     Social History   Socioeconomic History   Marital status: Single    Spouse name: GF-Betty   Number of children: 2   Years of education: 11   Highest education level: Not on file  Occupational History   Occupation: Buyer, retail: [employer]   Tobacco Use   Smoking status: Never   Smokeless tobacco: Current    Types: Chew  Vaping Use   Vaping Use: Never used  Substance and Sexual Activity   Alcohol use: Yes    Comment: social   Drug use: No   Sexual activity: Not Currently  Other Topics Concern   Not on file  Social History Narrative   Marital status: divorced, girlfriend x 1977     Children: 1      Tobacco: none; chew tobacco      Alcohol: beers on weekends      Exercise: none   Social Determinants of Health   Financial Resource Strain: Low Risk    Difficulty of Paying Living Expenses: Not hard at all  Food Insecurity: Food Insecurity Present   Worried About Charity fundraiser in the Last Year: Sometimes true   Arboriculturist in the Last Year: Sometimes true  Transportation Needs: No Transportation Needs   Lack of Transportation (Medical): No   Lack of Transportation (Non-Medical): No  Physical Activity: Insufficiently Active   Days of Exercise per Week: 7 days   Minutes of Exercise per Session: 10 min  Stress: Stress Concern Present   Feeling of Stress : To some extent  Social Connections: Moderately Isolated   Frequency of Communication with Friends and Family: More than three times a week   Frequency of Social Gatherings with Friends and Family: More than three times a week   Attends Religious Services: More than 4 times per year   Active Member of [employer] or Organizations: No   Attends Archivist Meetings: Never   Marital Status: Divorced  Human resources officer Violence: Not At Risk   Fear of Current or Ex-Partner: No   Emotionally Abused: No   Physically Abused: No   Sexually Abused: No    Review of Systems   Constitutional:  Positive for fatigue.  Respiratory:  Positive for apnea and shortness of breath.   Psychiatric/Behavioral:  Positive for sleep disturbance.    There were no vitals filed for this visit.    Physical Exam Constitutional:      Appearance: He is obese.  HENT:     Head: Normocephalic and atraumatic.     Right Ear: Tympanic membrane normal.     Mouth/Throat:     Mouth: Mucous membranes are moist.  Cardiovascular:     Rate and Rhythm: Normal rate and regular rhythm.  Heart sounds: No murmur heard.   No friction rub.  Pulmonary:     Effort: No respiratory distress.     Breath sounds: No stridor. No wheezing or rhonchi.  Musculoskeletal:     Cervical back: No rigidity or tenderness.  Neurological:     Mental Status: He is alert.  Psychiatric:        Mood and Affect: Mood normal.     Data Reviewed: Sleep study 12/07/2020 shows no significant obstructive sleep apnea  PFT 12/11/2020 shows no significant obstructive disease, no restriction, no significant bronchodilator response  CT scan of the chest 12/06/2020 shows no significant pathology in the lungs  Assessment:   History of obstructive sleep apnea -Was negative on recent sleep study -Was intolerant to CPAP in the past  Nasal stuffiness and congestion -Requests evaluation by ENT -Did make a referral to Dr. Deeann Saint office during the last visit but patient has not been seen  Shortness of breath on exertion -This is likely multifactorial -I believe deconditioning may be playing a big role  Diabetes -Uncontrolled at present  Restless legs   Plan/Recommendations:  Importance of regular exercises  Importance of good adequate sleep discussed  Will make a referral to Dr. Benjamine Mola to be evaluated for nasal stuffiness and congestion  He will continue his Neurontin for his restless legs  I encouraged him to go to sleep earlier and transfixes wake up time should I get about 6 to 8 hours of sleep  I  will follow-up with him in about 6 months  Encouraged to call with any significant concerns  I spent 30 minutes dedicated to the care of this patient on the date of this encounter to include previsit review of records, face-to-face time with the patient discussing conditions above, post visit ordering of testing, clinical documentation with electronic health record and communicated necessary findings to members of the patient's care team   Sherrilyn Rist MD [facility] 03/07/2021, 9:08 AM  CC: Vevelyn Francois, NP

## 2021-03-07 NOTE — Patient Instructions (Signed)
°  Get regular exercises 20 to 30 minutes of regular walking on a daily basis will help  Improve sleep quality Try and get about 6 to 8 hours of sleep every night Set you wake up time to much [place] than what you are doing currently   All the studies that we did recently were normal  Your CT scan of the chest was within normal limits  Your breathing study was within normal limits  Your recent sleep study did not show significant sleep apnea, did show a lot of leg movements for which you are already on medications that should help this  I will see you back in about 6 months  I believe the biggest impact that he can make is to start exercising regularly  We will contact Dr. Velvet Bathe office for follow-up appointment for your nasal stuffiness and congestion

## 2021-04-08 ENCOUNTER — Encounter (INDEPENDENT_AMBULATORY_CARE_PROVIDER_SITE_OTHER): Payer: Medicare HMO | Admitting: Ophthalmology

## 2021-04-29 ENCOUNTER — Other Ambulatory Visit: Payer: Self-pay

## 2021-04-29 ENCOUNTER — Encounter (INDEPENDENT_AMBULATORY_CARE_PROVIDER_SITE_OTHER): Payer: Medicare HMO | Admitting: Ophthalmology

## 2021-04-29 DIAGNOSIS — H2511 Age-related nuclear cataract, right eye: Secondary | ICD-10-CM

## 2021-04-29 DIAGNOSIS — H26492 Other secondary cataract, left eye: Secondary | ICD-10-CM

## 2021-04-29 DIAGNOSIS — H35033 Hypertensive retinopathy, bilateral: Secondary | ICD-10-CM | POA: Diagnosis not present

## 2021-04-29 DIAGNOSIS — E113313 Type 2 diabetes mellitus with moderate nonproliferative diabetic retinopathy with macular edema, bilateral: Secondary | ICD-10-CM

## 2021-04-29 DIAGNOSIS — H43813 Vitreous degeneration, bilateral: Secondary | ICD-10-CM | POA: Diagnosis not present

## 2021-04-29 DIAGNOSIS — I10 Essential (primary) hypertension: Secondary | ICD-10-CM

## 2021-05-02 ENCOUNTER — Ambulatory Visit (INDEPENDENT_AMBULATORY_CARE_PROVIDER_SITE_OTHER): Payer: Medicare HMO | Admitting: Nurse Practitioner

## 2021-05-02 ENCOUNTER — Other Ambulatory Visit: Payer: Self-pay

## 2021-05-02 ENCOUNTER — Encounter: Payer: Self-pay | Admitting: Nurse Practitioner

## 2021-05-02 VITALS — BP 129/61 | HR 65 | Temp 98.0°F | Ht 70.0 in | Wt 231.0 lb

## 2021-05-02 DIAGNOSIS — E1140 Type 2 diabetes mellitus with diabetic neuropathy, unspecified: Secondary | ICD-10-CM | POA: Diagnosis not present

## 2021-05-02 DIAGNOSIS — E782 Mixed hyperlipidemia: Secondary | ICD-10-CM | POA: Diagnosis not present

## 2021-05-02 DIAGNOSIS — R0602 Shortness of breath: Secondary | ICD-10-CM

## 2021-05-02 DIAGNOSIS — K219 Gastro-esophageal reflux disease without esophagitis: Secondary | ICD-10-CM | POA: Diagnosis not present

## 2021-05-02 DIAGNOSIS — Z1211 Encounter for screening for malignant neoplasm of colon: Secondary | ICD-10-CM

## 2021-05-02 LAB — POCT GLYCOSYLATED HEMOGLOBIN (HGB A1C)
HbA1c POC (<> result, manual entry): 8.4 % (ref 4.0–5.6)
HbA1c, POC (controlled diabetic range): 8.4 % — AB (ref 0.0–7.0)
HbA1c, POC (prediabetic range): 8.4 % — AB (ref 5.7–6.4)
Hemoglobin A1C: 8.4 % — AB (ref 4.0–5.6)

## 2021-05-02 MED ORDER — PANTOPRAZOLE SODIUM 40 MG PO TBEC: 40.0000 mg | DELAYED_RELEASE_TABLET | Freq: Two times a day (BID) | ORAL | 2 refills | Status: DC

## 2021-05-02 NOTE — Patient Instructions (Addendum)
Gastroesophageal Reflux Disease, Adult Gastroesophageal reflux (GER) happens when acid from the stomach flows up into the tube that connects the mouth and the stomach (esophagus). Normally, food travels down the esophagus and stays in the stomach to be digested. With GER, food and stomach acid sometimes move back up into the esophagus. You may have a disease called gastroesophageal reflux disease (GERD) if the reflux: Happens often. Causes frequent or very bad symptoms. Causes problems such as damage to the esophagus. When this happens, the esophagus becomes sore and swollen. Over time, GERD can make small holes (ulcers) in the lining of the esophagus. What are the causes? This condition is caused by a problem with the muscle between the esophagus and the stomach. When this muscle is weak or not normal, it does not close properly to keep food and acid from coming back up from the stomach. The muscle can be weak because of: Tobacco use. Pregnancy. Having a certain type of hernia (hiatal hernia). Alcohol use. Certain foods and drinks, such as coffee, chocolate, onions, and peppermint. What increases the risk? Being overweight. Having a disease that affects your connective tissue. Taking NSAIDs, such a ibuprofen. What are the signs or symptoms? Heartburn. Difficult or painful swallowing. The feeling of having a lump in the throat. A bitter taste in the mouth. Bad breath. Having a lot of saliva. Having an upset or bloated stomach. Burping. Chest pain. Different conditions can cause chest pain. Make sure you see your doctor if you have chest pain. Shortness of breath or wheezing. A long-term cough or a cough at night. Wearing away of the surface of teeth (tooth enamel). Weight loss. How is this treated? Making changes to your diet. Taking medicine. Having surgery. Treatment will depend on how bad your symptoms are. Follow these instructions at home: Eating and drinking  Follow a  diet as told by your doctor. You may need to avoid foods and drinks such as: Coffee and tea, with or without caffeine. Drinks that contain alcohol. Energy drinks and sports drinks. Bubbly (carbonated) drinks or sodas. Chocolate and cocoa. Peppermint and mint flavorings. Garlic and onions. Horseradish. Spicy and acidic foods. These include peppers, chili powder, curry powder, vinegar, hot sauces, and BBQ sauce. Citrus fruit juices and citrus fruits, such as oranges, lemons, and limes. Tomato-based foods. These include red sauce, chili, salsa, and pizza with red sauce. Fried and fatty foods. These include donuts, french fries, potato chips, and high-fat dressings. High-fat meats. These include hot dogs, rib eye steak, sausage, ham, and bacon. High-fat dairy items, such as whole milk, butter, and cream cheese. Eat small meals often. Avoid eating large meals. Avoid drinking large amounts of liquid with your meals. Avoid eating meals during the 2-3 hours before bedtime. Avoid lying down right after you eat. Do not exercise right after you eat. Lifestyle  Do not smoke or use any products that contain nicotine or tobacco. If you need help quitting, ask your doctor. Try to lower your stress. If you need help doing this, ask your doctor. If you are overweight, lose an amount of weight that is healthy for you. Ask your doctor about a safe weight loss goal. General instructions Pay attention to any changes in your symptoms. Take over-the-counter and prescription medicines only as told by your doctor. Do not take aspirin, ibuprofen, or other NSAIDs unless your doctor says it is okay. Wear loose clothes. Do not wear anything tight around your waist. Raise (elevate) the head of your bed about 6  inches (15 cm). You may need to use a wedge to do this. Avoid bending over if this makes your symptoms worse. Keep all follow-up visits. Contact a doctor if: You have new symptoms. You lose weight and you  do not know why. You have trouble swallowing or it hurts to swallow. You have wheezing or a cough that keeps happening. You have a hoarse voice. Your symptoms do not get better with treatment. Get help right away if: You have sudden pain in your arms, neck, jaw, teeth, or back. You suddenly feel sweaty, dizzy, or light-headed. You have chest pain or shortness of breath. You vomit and the vomit is green, yellow, or black, or it looks like blood or coffee grounds. You faint. Your poop (stool) is red, bloody, or black. You cannot swallow, drink, or eat. These symptoms may represent a serious problem that is an emergency. Do not wait to see if the symptoms will go away. Get medical help right away. Call your local emergency services (911 in the [place] Do not drive yourself to the hospital. Summary If a person has gastroesophageal reflux disease (GERD), food and stomach acid move back up into the esophagus and cause symptoms or problems such as damage to the esophagus. Treatment will depend on how bad your symptoms are. Follow a diet as told by your doctor. Take all medicines only as told by your doctor. This information is not intended to replace advice given to you by your health care provider. Make sure you discuss any questions you have with your health care provider. Document Revised: 08/29/2019 Document Reviewed: 08/29/2019 Elsevier Patient Education  [place]. Diabetes Mellitus and Nutrition, Adult When you have diabetes, or diabetes mellitus, it is very important to have healthy eating habits because your blood sugar (glucose) levels are greatly affected by what you eat and drink. Eating healthy foods in the right amounts, at about the same times every day, can help you: Manage your blood glucose. Lower your risk of heart disease. Improve your blood pressure. Reach or maintain a healthy weight. What can affect my meal plan? Every person with diabetes is different, and each  person has different needs for a meal plan. Your health care provider may recommend that you work with a dietitian to make a meal plan that is best for you. Your meal plan may vary depending on factors such as: The calories you need. The medicines you take. Your weight. Your blood glucose, blood pressure, and cholesterol levels. Your activity level. Other health conditions you have, such as heart or kidney disease. How do carbohydrates affect me? Carbohydrates, also called carbs, affect your blood glucose level more than any other type of food. Eating carbs raises the amount of glucose in your blood. It is important to know how many carbs you can safely have in each meal. This is different for every person. Your dietitian can help you calculate how many carbs you should have at each meal and for each snack. How does alcohol affect me? Alcohol can cause a decrease in blood glucose (hypoglycemia), especially if you use insulin or take certain diabetes medicines by mouth. Hypoglycemia can be a life-threatening condition. Symptoms of hypoglycemia, such as sleepiness, dizziness, and confusion, are similar to symptoms of having too much alcohol. Do not drink alcohol if: Your health care provider tells you not to drink. You are pregnant, may be pregnant, or are planning to become pregnant. If you drink alcohol: Limit how much you have to: 0-1  drink a day for women. 0-2 drinks a day for men. Know how much alcohol is in your drink. In the [place] one drink equals one 12 oz bottle of beer (355 mL), one 5 oz glass of wine (148 mL), or one 1 oz glass of hard liquor (44 mL). Keep yourself hydrated with water, diet soda, or unsweetened iced tea. Keep in mind that regular soda, juice, and other mixers may contain a lot of sugar and must be counted as carbs. What are tips for following this plan? Reading food labels Start by checking the serving size on the Nutrition Facts label of packaged foods and drinks.  The number of calories and the amount of carbs, fats, and other nutrients listed on the label are based on one serving of the item. Many items contain more than one serving per package. Check the total grams (g) of carbs in one serving. Check the number of grams of saturated fats and trans fats in one serving. Choose foods that have a low amount or none of these fats. Check the number of milligrams (mg) of salt (sodium) in one serving. Most people should limit total sodium intake to less than 2,300 mg per day. Always check the nutrition information of foods labeled as "low-fat" or "nonfat." These foods may be higher in added sugar or refined carbs and should be avoided. Talk to your dietitian to identify your daily goals for nutrients listed on the label. Shopping Avoid buying canned, pre-made, or processed foods. These foods tend to be high in fat, sodium, and added sugar. Shop around the outside edge of the grocery store. This is where you will most often find fresh fruits and vegetables, bulk grains, fresh meats, and fresh dairy products. Cooking Use low-heat cooking methods, such as baking, instead of high-heat cooking methods, such as deep frying. Cook using healthy oils, such as olive, canola, or sunflower oil. Avoid cooking with butter, cream, or high-fat meats. Meal planning Eat meals and snacks regularly, preferably at the same times every day. Avoid going long periods of time without eating. Eat foods that are high in fiber, such as fresh fruits, vegetables, beans, and whole grains. Eat 4-6 oz (112-168 g) of lean protein each day, such as lean meat, chicken, fish, eggs, or tofu. One ounce (oz) (28 g) of lean protein is equal to: 1 oz (28 g) of meat, chicken, or fish. 1 egg.  cup (62 g) of tofu. Eat some foods each day that contain healthy fats, such as avocado, nuts, seeds, and fish. What foods should I eat? Fruits Berries. Apples. Oranges. Peaches. Apricots. Plums. Grapes. Mangoes.  Papayas. Pomegranates. Kiwi. Cherries. Vegetables Leafy greens, including lettuce, spinach, kale, chard, collard greens, mustard greens, and cabbage. Beets. Cauliflower. Broccoli. Carrots. Green beans. Tomatoes. Peppers. Onions. Cucumbers. Brussels sprouts. Grains Whole grains, such as whole-wheat or whole-grain bread, crackers, tortillas, cereal, and pasta. Unsweetened oatmeal. Quinoa. Brown or wild rice. Meats and other proteins Seafood. Poultry without skin. Lean cuts of poultry and beef. Tofu. Nuts. Seeds. Dairy Low-fat or fat-free dairy products such as milk, yogurt, and cheese. The items listed above may not be a complete list of foods and beverages you can eat and drink. Contact a dietitian for more information. What foods should I avoid? Fruits Fruits canned with syrup. Vegetables Canned vegetables. Frozen vegetables with butter or cream sauce. Grains Refined white flour and flour products such as bread, pasta, snack foods, and cereals. Avoid all processed foods. Meats and other proteins Fatty cuts  of meat. Poultry with skin. Breaded or fried meats. Processed meat. Avoid saturated fats. Dairy Full-fat yogurt, cheese, or milk. Beverages Sweetened drinks, such as soda or iced tea. The items listed above may not be a complete list of foods and beverages you should avoid. Contact a dietitian for more information. Questions to ask a health care provider Do I need to meet with a certified diabetes care and education specialist? Do I need to meet with a dietitian? What number can I call if I have questions? When are the best times to check my blood glucose? Where to find more information: American Diabetes Association: diabetes.org Academy of Nutrition and Dietetics: eatright.[employer] of Diabetes and Digestive and Kidney Diseases: [employer] Association of Diabetes Care & Education Specialists: diabeteseducator.org Summary It is important to have healthy eating  habits because your blood sugar (glucose) levels are greatly affected by what you eat and drink. It is important to use alcohol carefully. A healthy meal plan will help you manage your blood glucose and lower your risk of heart disease. Your health care provider may recommend that you work with a dietitian to make a meal plan that is best for you. This information is not intended to replace advice given to you by your health care provider. Make sure you discuss any questions you have with your health care provider. Document Revised: 09/21/2019 Document Reviewed: 09/21/2019 Elsevier Patient Education  [place].

## 2021-05-02 NOTE — Progress Notes (Signed)
[place], [place]  [address] Phone:  [phone]   Fax:  [phone]   Established Patient Office Visit  Subjective:  Patient ID: Clayton Crompton., male    DOB: 1950-09-02  Age: 71 y.o. MRN: [id number]  CC:  Chief Complaint  Patient presents with   Follow-up    Pt is here for 3 month follow up. Pt stated [name] has been having shortness of breath feeling like [name] is gasping for air., Pt need a refill metformin gabapentin    HPI Clayton Ruddle. presents for follow up. She  has a past medical history of Cancer ([place]), Diabetes mellitus without complication ([place]), GERD (gastroesophageal reflux disease), Hyperlipidemia, Hypertension, Myocardial infarction ([place]), and OSA (obstructive sleep apnea).   Dyspnea Patient complains of shortness of breath. Symptoms occur while supine only. Symptoms began gradually worsening since. Associated symptoms include  hoarseness   . [name] denies chest pain,  and constant cough. [name] has not had recent travel. Symptoms are exacerbated by beer, foods while drinking soda. [name] has a loud coarse sound in his throat. [name] has to sleep with the fan. [name] has noticed acid reflux..    Past Medical History:  Diagnosis Date   Cancer [facility])    Prostate cancer age 21; Lupron treatments followed by prostatectomy.   Diabetes mellitus without complication (HCC)    GERD (gastroesophageal reflux disease)    Hyperlipidemia    Hypertension    Myocardial infarction [facility])    age 63; no stenting.   OSA (obstructive sleep apnea)    non-compliant with CPAP.    Past Surgical History:  Procedure Laterality Date   CHOLECYSTECTOMY     PROSTATE SURGERY      Family History  Problem Relation Age of Onset   Cancer Mother        leukemia   Diabetes Mother    Cancer Father 23       lung cancer with mets   Heart disease Maternal Grandmother    Cancer Maternal Grandfather    Heart disease Paternal Grandmother    Cancer Paternal  Grandfather     Social History   Socioeconomic History   Marital status: Single    Spouse name: GF-Betty   Number of children: 2   Years of education: 11   Highest education level: Not on file  Occupational History   Occupation: Buyer, retail: [employer]   Tobacco Use   Smoking status: Never   Smokeless tobacco: Current    Types: Chew  Vaping Use   Vaping Use: Never used  Substance and Sexual Activity   Alcohol use: Yes    Comment: social   Drug use: No   Sexual activity: Not Currently  Other Topics Concern   Not on file  Social History Narrative   Marital status: divorced, girlfriend x 1977     Children: 1      Tobacco: none; chew tobacco      Alcohol: beers on weekends      Exercise: none   Social Determinants of Health   Financial Resource Strain: Low Risk    Difficulty of Paying Living Expenses: Not hard at all  Food Insecurity: Food Insecurity Present   Worried About Charity fundraiser in the Last Year: Sometimes true   Arboriculturist in the Last Year: Sometimes true  Transportation Needs: No Transportation Needs   Lack of Transportation (Medical): No  Lack of Transportation (Non-Medical): No  Physical Activity: Insufficiently Active   Days of Exercise per Week: 7 days   Minutes of Exercise per Session: 10 min  Stress: Stress Concern Present   Feeling of Stress : To some extent  Social Connections: Moderately Isolated   Frequency of Communication with Friends and Family: More than three times a week   Frequency of Social Gatherings with Friends and Family: More than three times a week   Attends Religious Services: More than 4 times per year   Active Member of [employer] or Organizations: No   Attends Archivist Meetings: Never   Marital Status: Divorced  Human resources officer Violence: Not At Risk   Fear of Current or Ex-Partner: No   Emotionally Abused: No   Physically Abused: No   Sexually Abused: No    Outpatient Medications  Prior to Visit  Medication Sig Dispense Refill   aspirin 81 MG tablet Take 81 mg by mouth daily.     azelastine (ASTELIN) 0.1 % nasal spray PLACE 2 SPRAYS INTO BOTH NOSTRILS 2 (TWO) TIMES DAILY. USE IN EACH NOSTRIL AS DIRECTED 30 mL 1   BAYER CONTOUR NEXT TEST test strip CHECK BLOOD SUGAR TWICE DAILY 100 each 5   BAYER MICROLET LANCETS lancets Check blood sugar two times daily. 100 each 5   Blood Glucose Monitoring Suppl (BAYER CONTOUR NEXT MONITOR) w/Device KIT Check blood sugar two times daily. 1 kit 2   fluvastatin XL (LESCOL XL) 80 MG 24 hr tablet Take 1 tablet (80 mg total) by mouth daily. start taking every other day for the first 2 weeks then daily 30 tablet 3   lisinopril (ZESTRIL) 5 MG tablet Take 1 tablet (5 mg total) by mouth daily. 90 tablet 3   metFORMIN (GLUCOPHAGE-XR) 500 MG 24 hr tablet Take 1 tablet (500 mg total) by mouth daily with breakfast. 4 tablets daily 90 tablet 3   omeprazole (PRILOSEC) 20 MG capsule TAKE 1 CAPSULE (20 MG TOTAL) BY MOUTH 2 (TWO) TIMES DAILY BEFORE A MEAL. 180 capsule 1   gabapentin (NEURONTIN) 300 MG capsule TAKE 3 CAPSULES (900 MG TOTAL) BY MOUTH 2 (TWO) TIMES DAILY. 180 capsule 0   No facility-administered medications prior to visit.    Allergies  Allergen Reactions   Cymbalta [Duloxetine Hcl] Nausea And Vomiting   Darvocet [Propoxyphene N-Acetaminophen] Hives    ROS Review of Systems    Objective:    Physical Exam Constitutional:      Appearance: [name] is normal weight.  HENT:     Head: Normocephalic and atraumatic.     Nose: Nose normal.     Mouth/Throat:     Mouth: Mucous membranes are moist.  Cardiovascular:     Rate and Rhythm: Normal rate and regular rhythm.     Pulses: Normal pulses.     Heart sounds: Normal heart sounds.  Pulmonary:     Effort: Pulmonary effort is normal.     Breath sounds: Normal breath sounds.  Musculoskeletal:        General: Normal range of motion.     Cervical back: Normal range of motion.  Skin:     General: Skin is warm and dry.     Capillary Refill: Capillary refill takes less than 2 seconds.  Neurological:     General: No focal deficit present.     Mental Status: [name] is alert and oriented to person, place, and time.  Psychiatric:        Mood and Affect:  Mood normal.        Behavior: Behavior normal.        Thought Content: Thought content normal.        Judgment: Judgment normal.    BP 129/61    Pulse 65    Temp 98 F (36.7 C)    Ht _0  (1.778 m)    Wt 231 lb 0.4 oz (104.8 kg)    SpO2 98%    BMI 33.15 kg/m  Wt Readings from Last 3 Encounters:  05/02/21 231 lb 0.4 oz (104.8 kg)  03/07/21 231 lb (104.8 kg)  01/31/21 229 lb 8 oz (104.1 kg)     There are no preventive care reminders to display for this patient.   There are no preventive care reminders to display for this patient.  Lab Results  Component Value Date   TSH 4.000 08/15/2020   Lab Results  Component Value Date   WBC 9.7 08/15/2020   HGB 14.9 08/15/2020   HCT 45.0 08/15/2020   MCV 93 08/15/2020   PLT 154 08/15/2020   Lab Results  Component Value Date   NA 137 01/31/2021   K 4.9 01/31/2021   CO2 20 05/06/2019   GLUCOSE 227 (H) 01/31/2021   BUN 12 01/31/2021   CREATININE 0.88 01/31/2021   BILITOT 0.8 01/31/2021   ALKPHOS 99 01/31/2021   AST 29 01/31/2021   ALT 49 (H) 05/06/2019   PROT 6.9 01/31/2021   ALBUMIN 4.7 01/31/2021   CALCIUM 9.6 01/31/2021   EGFR 94 01/31/2021   GFR 92.83 08/03/2018   Lab Results  Component Value Date   CHOL 247 (H) 01/31/2021   Lab Results  Component Value Date   HDL 49 01/31/2021   Lab Results  Component Value Date   LDLCALC 159 (H) 01/31/2021   Lab Results  Component Value Date   TRIG 211 (H) 01/31/2021   Lab Results  Component Value Date   CHOLHDL 5.0 01/31/2021   Lab Results  Component Value Date   HGBA1C 8.4 (A) 05/02/2021   HGBA1C 8.4 05/02/2021   HGBA1C 8.4 (A) 05/02/2021   HGBA1C 8.4 (A) 05/02/2021      Assessment & Plan:    Problem List Items Addressed This Visit       Endocrine   DM (diabetes mellitus) ([place]) - Primary Improving  Encourage compliance with current treatment regimen Encourage regular CBG monitoring Encourage contacting office if excessive hyperglycemia and or hypoglycemia Lifestyle modification with healthy diet (fewer calories, more high fiber foods, whole grains and non-starchy vegetables, lower fat meat and fish, low-fat diary include healthy oils) regular exercise (physical activity) and weight loss    Relevant Orders   POCT glycosylated hemoglobin (Hb A1C) (Completed)   Comp. Metabolic Panel (12)   Other Visit Diagnoses     Mixed hyperlipidemia       Relevant Orders   Lipid panel   Shortness of breath       Gastroesophageal reflux disease without esophagitis     Education provided   Relevant Medications   pantoprazole (PROTONIX) 40 MG tablet   Screening for colon cancer       Relevant Orders   Ambulatory referral to Gastroenterology       Meds ordered this encounter  Medications   pantoprazole (PROTONIX) 40 MG tablet    Sig: Take 1 tablet (40 mg total) by mouth 2 (two) times daily before a meal.    Dispense:  60 tablet    Refill:  2  Order Specific Question:   Supervising Provider    Answer:   Tresa Garter [[id number]]    Follow-up: Return in about 3 months (around 08/02/2021).    Vevelyn Francois, NP

## 2021-05-03 LAB — COMP. METABOLIC PANEL (12)
AST: 19 IU/L (ref 0–40)
Albumin/Globulin Ratio: 2 (ref 1.2–2.2)
Albumin: 4.3 g/dL (ref 3.8–4.8)
Alkaline Phosphatase: 81 IU/L (ref 44–121)
BUN/Creatinine Ratio: 14 (ref 10–24)
BUN: 11 mg/dL (ref 8–27)
Bilirubin Total: 1 mg/dL (ref 0.0–1.2)
Calcium: 9.2 mg/dL (ref 8.6–10.2)
Chloride: 103 mmol/L (ref 96–106)
Creatinine, Ser: 0.77 mg/dL (ref 0.76–1.27)
Globulin, Total: 2.2 g/dL (ref 1.5–4.5)
Glucose: 166 mg/dL — ABNORMAL HIGH (ref 70–99)
Potassium: 4.6 mmol/L (ref 3.5–5.2)
Sodium: 142 mmol/L (ref 134–144)
Total Protein: 6.5 g/dL (ref 6.0–8.5)
eGFR: 98 mL/min/{1.73_m2} (ref >59)

## 2021-05-03 LAB — LIPID PANEL
Chol/HDL Ratio: 4.3 ratio (ref 0.0–5.0)
Cholesterol, Total: 219 mg/dL — ABNORMAL HIGH (ref 100–199)
HDL: 51 mg/dL (ref >39)
LDL Chol Calc (NIH): 139 mg/dL — ABNORMAL HIGH (ref 0–99)
Triglycerides: 164 mg/dL — ABNORMAL HIGH (ref 0–149)
VLDL Cholesterol Cal: 29 mg/dL (ref 5–40)

## 2021-05-27 ENCOUNTER — Other Ambulatory Visit: Payer: Self-pay

## 2021-05-27 ENCOUNTER — Encounter (INDEPENDENT_AMBULATORY_CARE_PROVIDER_SITE_OTHER): Payer: Medicare HMO | Admitting: Ophthalmology

## 2021-05-27 DIAGNOSIS — H35033 Hypertensive retinopathy, bilateral: Secondary | ICD-10-CM

## 2021-05-27 DIAGNOSIS — E113313 Type 2 diabetes mellitus with moderate nonproliferative diabetic retinopathy with macular edema, bilateral: Secondary | ICD-10-CM

## 2021-05-27 DIAGNOSIS — I10 Essential (primary) hypertension: Secondary | ICD-10-CM | POA: Diagnosis not present

## 2021-05-27 DIAGNOSIS — H43813 Vitreous degeneration, bilateral: Secondary | ICD-10-CM

## 2021-06-09 ENCOUNTER — Other Ambulatory Visit: Payer: Self-pay | Admitting: Nurse Practitioner

## 2021-06-09 DIAGNOSIS — E1140 Type 2 diabetes mellitus with diabetic neuropathy, unspecified: Secondary | ICD-10-CM

## 2021-06-09 DIAGNOSIS — G8929 Other chronic pain: Secondary | ICD-10-CM

## 2021-06-10 ENCOUNTER — Telehealth: Payer: Self-pay

## 2021-06-10 ENCOUNTER — Other Ambulatory Visit: Payer: Self-pay | Admitting: Nurse Practitioner

## 2021-06-10 DIAGNOSIS — E1140 Type 2 diabetes mellitus with diabetic neuropathy, unspecified: Secondary | ICD-10-CM

## 2021-06-10 DIAGNOSIS — G8929 Other chronic pain: Secondary | ICD-10-CM

## 2021-06-10 MED ORDER — GABAPENTIN 300 MG PO CAPS: 900.0000 mg | ORAL_CAPSULE | Freq: Two times a day (BID) | ORAL | 0 refills | Status: DC

## 2021-06-10 NOTE — Telephone Encounter (Signed)
This has already been filled and sent to the patients pharmacy today. ?

## 2021-06-10 NOTE — Telephone Encounter (Signed)
gabapentin

## 2021-06-24 ENCOUNTER — Encounter (INDEPENDENT_AMBULATORY_CARE_PROVIDER_SITE_OTHER): Payer: Medicare HMO | Admitting: Ophthalmology

## 2021-06-24 DIAGNOSIS — H43813 Vitreous degeneration, bilateral: Secondary | ICD-10-CM | POA: Diagnosis not present

## 2021-06-24 DIAGNOSIS — E113313 Type 2 diabetes mellitus with moderate nonproliferative diabetic retinopathy with macular edema, bilateral: Secondary | ICD-10-CM

## 2021-06-24 DIAGNOSIS — H35033 Hypertensive retinopathy, bilateral: Secondary | ICD-10-CM | POA: Diagnosis not present

## 2021-06-24 DIAGNOSIS — I10 Essential (primary) hypertension: Secondary | ICD-10-CM | POA: Diagnosis not present

## 2021-07-08 ENCOUNTER — Ambulatory Visit (AMBULATORY_SURGERY_CENTER): Payer: Medicare HMO | Admitting: *Deleted

## 2021-07-08 VITALS — Ht 70.5 in | Wt 222.0 lb

## 2021-07-08 DIAGNOSIS — Z1211 Encounter for screening for malignant neoplasm of colon: Secondary | ICD-10-CM

## 2021-07-08 MED ORDER — PEG 3350-KCL-NA BICARB-NACL 420 G PO SOLR: 4000.0000 mL | Freq: Once | ORAL | 0 refills | Status: AC

## 2021-07-08 NOTE — Progress Notes (Signed)
No egg or soy allergy known to patient  ?No issues known to pt with past sedation with any surgeries or procedures ?Patient denies ever being told they had issues or difficulty with intubation  ?No FH of Malignant Hyperthermia ?Pt is not on diet pills ?Pt is not on  home 02  ?Pt No A fib or A flutter ?Pt is not on blood thinners  ? ?Pt states issues with constipation the last 23 yrs after prostate hx-  uses miralax powder- after using several days has a BM- but typically hard bm's and has a bm daily but hard to pass out - a week or so ago had 2 weeks with no bm's- used Miaralx and an OTC laxative with help but still daily hard bm's- will do a 2 day [place] prep for pt  ? ?Last week removed 5 tics and was very sick but feels better  ? ?  NO PA's for preps discussed with pt In PV today  ?Discussed with pt there will be an out-of-pocket cost for prep and that varies from $0 to 70 +  dollars - pt verbalized understanding  ?Pt instructed to use Singlecare.com or GoodRx for a price reduction on prep  ? ?PV completed over the phone. Pt verified name, DOB, address and insurance during PV today.  ?Pt mailed instruction packet with copy of consent form to read and not return, and instructions.  ?Pt encouraged to call with questions or issues.  ?If pt has My chart, procedure instructions sent via My Chart  ?Insurance confirmed with pt at [facility] today  ? ?

## 2021-07-19 ENCOUNTER — Telehealth: Payer: Self-pay

## 2021-07-19 NOTE — Telephone Encounter (Signed)
Gabapentin

## 2021-07-22 ENCOUNTER — Telehealth: Payer: Self-pay

## 2021-07-22 ENCOUNTER — Other Ambulatory Visit: Payer: Self-pay | Admitting: Nurse Practitioner

## 2021-07-22 ENCOUNTER — Encounter: Payer: Self-pay | Admitting: Gastroenterology

## 2021-07-22 ENCOUNTER — Encounter (INDEPENDENT_AMBULATORY_CARE_PROVIDER_SITE_OTHER): Payer: Medicare HMO | Admitting: Ophthalmology

## 2021-07-22 DIAGNOSIS — G8929 Other chronic pain: Secondary | ICD-10-CM

## 2021-07-22 DIAGNOSIS — E1140 Type 2 diabetes mellitus with diabetic neuropathy, unspecified: Secondary | ICD-10-CM

## 2021-07-22 NOTE — Telephone Encounter (Signed)
Gabapentin refill   [facility] in [place]

## 2021-07-24 ENCOUNTER — Ambulatory Visit (INDEPENDENT_AMBULATORY_CARE_PROVIDER_SITE_OTHER): Payer: Medicare HMO | Admitting: Nurse Practitioner

## 2021-07-24 ENCOUNTER — Ambulatory Visit (AMBULATORY_SURGERY_CENTER): Payer: Medicare HMO | Admitting: Gastroenterology

## 2021-07-24 ENCOUNTER — Encounter: Payer: Self-pay | Admitting: Nurse Practitioner

## 2021-07-24 ENCOUNTER — Encounter: Payer: Self-pay | Admitting: Gastroenterology

## 2021-07-24 VITALS — BP 122/57 | HR 74 | Temp 98.2°F | Ht 70.0 in | Wt 217.4 lb

## 2021-07-24 VITALS — BP 117/60 | HR 51 | Temp 98.2°F | Resp 14 | Ht 70.0 in | Wt 222.0 lb

## 2021-07-24 DIAGNOSIS — D122 Benign neoplasm of ascending colon: Secondary | ICD-10-CM

## 2021-07-24 DIAGNOSIS — S30860A Insect bite (nonvenomous) of lower back and pelvis, initial encounter: Secondary | ICD-10-CM

## 2021-07-24 DIAGNOSIS — G4733 Obstructive sleep apnea (adult) (pediatric): Secondary | ICD-10-CM | POA: Diagnosis not present

## 2021-07-24 DIAGNOSIS — D123 Benign neoplasm of transverse colon: Secondary | ICD-10-CM

## 2021-07-24 DIAGNOSIS — Z9189 Other specified personal risk factors, not elsewhere classified: Secondary | ICD-10-CM | POA: Diagnosis not present

## 2021-07-24 DIAGNOSIS — D120 Benign neoplasm of cecum: Secondary | ICD-10-CM | POA: Diagnosis not present

## 2021-07-24 DIAGNOSIS — E119 Type 2 diabetes mellitus without complications: Secondary | ICD-10-CM | POA: Diagnosis not present

## 2021-07-24 DIAGNOSIS — D127 Benign neoplasm of rectosigmoid junction: Secondary | ICD-10-CM

## 2021-07-24 DIAGNOSIS — Z1211 Encounter for screening for malignant neoplasm of colon: Secondary | ICD-10-CM | POA: Diagnosis not present

## 2021-07-24 DIAGNOSIS — W57XXXA Bitten or stung by nonvenomous insect and other nonvenomous arthropods, initial encounter: Secondary | ICD-10-CM | POA: Diagnosis not present

## 2021-07-24 MED ORDER — DOXYCYCLINE HYCLATE 100 MG PO TABS: 200.0000 mg | ORAL_TABLET | Freq: Once | ORAL | 0 refills | Status: AC

## 2021-07-24 MED ORDER — SODIUM CHLORIDE 0.9 % IV SOLN: 500.0000 mL | Freq: Once | INTRAVENOUS | Status: DC

## 2021-07-24 NOTE — Progress Notes (Signed)
[place], [place]  [address] Phone:  [phone]   Fax:  [phone] Subjective:   Patient ID: Clayton Crispo., male    DOB: [birth year], 71 y.o.   MRN: [id number]  Chief Complaint  Patient presents with   Follow-up    Patient is here today for a follow up visit. Patient had an colonscopy done and they found a tick in he rectal area. It was taken out and he is needing an antibiotic. Patient states that he also removed a tick of his right foot this morning. Patient has removed several other ticks off his body from last week as well.   HPI [name] 71 y.o. male  has a past medical history of Allergy, Arthritis, Cancer ([place]), Cataract, Chronic constipation, Diabetes mellitus without complication ([place]), Diabetic retinal damage of both eyes ([place]), GERD (gastroesophageal reflux disease), Hyperlipidemia, Hypertension, Myocardial infarction ([place]), OSA (obstructive sleep apnea), and Sleep apnea. To th [facility] for evaluation after tick bite with removal.   Currently works as a Development worker, international aid and states that he has removed several ticks from his legs and back since the beginning of the week. Had colonoscopy completed today, large tick was removed from buttocks. Patient brought in photo documentation from GI of tick prior to removal. Denies any pain, but states that he felt ill initially after bite. Symptoms subsided without intervention. Denies any symptoms today. Was referred to the [facility] by GI for prescription for antibiotics given the amount of ticks removed in the past week. Patient states that he removed a total of seven ticks. Denies any other concerns today.   Denies any fatigue, chest pain, shortness of breath, HA or dizziness. Denies any blurred vision, numbness or tingling.  Past Medical History:  Diagnosis Date   Allergy    seasonal   Arthritis    " all over me- back, hips, knees, hands, everywhere "   Cancer [facility])    Prostate cancer  age 76; Lupron treatments followed by prostatectomy.   Cataract    left removed, forming right   Chronic constipation    x 23 yrs after prostate surgery   Diabetes mellitus without complication (HCC)    Diabetic retinal damage of both eyes ([place])    gets shots every month   GERD (gastroesophageal reflux disease)    Hyperlipidemia    Hypertension    Myocardial infarction [facility])    age 70; no stenting.   OSA (obstructive sleep apnea)    non-compliant with CPAP.   Sleep apnea    no cpap    Past Surgical History:  Procedure Laterality Date   APPENDECTOMY     CHOLECYSTECTOMY     PROSTATE SURGERY      Family History  Problem Relation Age of Onset   Cancer Mother        leukemia   Diabetes Mother    Cancer Father 3       lung cancer with mets   Colon cancer Maternal Grandmother    Heart disease Maternal Grandmother    Heart disease Maternal Grandfather    Heart disease Paternal Grandmother    Cancer Paternal Grandfather        lung, prostate   Esophageal cancer Neg Hx    Colon polyps Neg Hx    Stomach cancer Neg Hx    Rectal cancer Neg Hx     Social History   Socioeconomic History   Marital status: Single  Spouse name: [name]   Number of children: 2   Years of education: 9   Highest education level: Not on file  Occupational History   Occupation: Buyer, retail: [employer]   Tobacco Use   Smoking status: Never   Smokeless tobacco: Current    Types: Chew  Vaping Use   Vaping Use: Never used  Substance and Sexual Activity   Alcohol use: Yes    Comment: social   Drug use: Not Currently   Sexual activity: Not Currently  Other Topics Concern   Not on file  Social History Narrative   Marital status: divorced, girlfriend x 1977     Children: 1      Tobacco: none; chew tobacco      Alcohol: beers on weekends      Exercise: none   Social Determinants of Health   Financial Resource Strain: Low Risk    Difficulty of Paying Living  Expenses: Not hard at all  Food Insecurity: Food Insecurity Present   Worried About Charity fundraiser in the Last Year: Sometimes true   Arboriculturist in the Last Year: Sometimes true  Transportation Needs: No Transportation Needs   Lack of Transportation (Medical): No   Lack of Transportation (Non-Medical): No  Physical Activity: Insufficiently Active   Days of Exercise per Week: 7 days   Minutes of Exercise per Session: 10 min  Stress: Stress Concern Present   Feeling of Stress : To some extent  Social Connections: Moderately Isolated   Frequency of Communication with Friends and Family: More than three times a week   Frequency of Social Gatherings with Friends and Family: More than three times a week   Attends Religious Services: More than 4 times per year   Active Member of [employer] or Organizations: No   Attends Archivist Meetings: Never   Marital Status: Divorced  Human resources officer Violence: Not At Risk   Fear of Current or Ex-Partner: No   Emotionally Abused: No   Physically Abused: No   Sexually Abused: No    Outpatient Medications Prior to Visit  Medication Sig Dispense Refill   aspirin 81 MG tablet Take 81 mg by mouth daily.     azelastine (ASTELIN) 0.1 % nasal spray PLACE 2 SPRAYS INTO BOTH NOSTRILS 2 (TWO) TIMES DAILY. USE IN EACH NOSTRIL AS DIRECTED 30 mL 1   BAYER CONTOUR NEXT TEST test strip CHECK BLOOD SUGAR TWICE DAILY 100 each 5   BAYER MICROLET LANCETS lancets Check blood sugar two times daily. 100 each 5   Blood Glucose Monitoring Suppl (BAYER CONTOUR NEXT MONITOR) w/Device KIT Check blood sugar two times daily. 1 kit 2   fluvastatin XL (LESCOL XL) 80 MG 24 hr tablet Take 1 tablet (80 mg total) by mouth daily. start taking every other day for the first 2 weeks then daily 30 tablet 3   gabapentin (NEURONTIN) 300 MG capsule TAKE 3 CAPSULES (900 MG TOTAL) BY MOUTH 2 (TWO) TIMES DAILY. 180 capsule 0   lisinopril (ZESTRIL) 5 MG tablet Take 1 tablet (5 mg  total) by mouth daily. 90 tablet 3   metFORMIN (GLUCOPHAGE-XR) 500 MG 24 hr tablet Take 1 tablet (500 mg total) by mouth daily with breakfast. 4 tablets daily (Patient taking differently: Take 500 mg by mouth daily with breakfast. 3 tablets BID) 90 tablet 3   pantoprazole (PROTONIX) 40 MG tablet Take 1 tablet (40 mg total) by mouth 2 (two) times daily before  a meal. 60 tablet 2   Polyethylene Glycol 3350 (MIRALAX PO) Take by mouth as needed. (Patient not taking: Reported on 07/24/2021)     No facility-administered medications prior to visit.    Allergies  Allergen Reactions   Cymbalta [Duloxetine Hcl] Nausea And Vomiting   Darvocet [Propoxyphene N-Acetaminophen] Hives    Review of Systems  Constitutional:  Negative for chills, fever and malaise/fatigue.  Respiratory:  Negative for cough and shortness of breath.   Cardiovascular:  Negative for chest pain, palpitations and leg swelling.  Gastrointestinal:  Negative for abdominal pain, blood in stool, constipation, diarrhea, nausea and vomiting.  Musculoskeletal: Negative.   Skin: Negative.   Neurological: Negative.   Psychiatric/Behavioral:  Negative for depression. The patient is not nervous/anxious.   All other systems reviewed and are negative.     Objective:    Physical Exam Vitals reviewed.  Constitutional:      General: He is not in acute distress.    Appearance: Normal appearance. He is normal weight.  HENT:     Head: Normocephalic.  Cardiovascular:     Rate and Rhythm: Normal rate and regular rhythm.     Pulses: Normal pulses.     Heart sounds: Normal heart sounds.     Comments: No obvious peripheral edema Pulmonary:     Effort: Pulmonary effort is normal.     Breath sounds: Normal breath sounds.  Musculoskeletal:        General: No swelling, tenderness, deformity or signs of injury. Normal range of motion.     Right lower leg: No edema.     Left lower leg: No edema.  Skin:    General: Skin is warm and dry.      Capillary Refill: Capillary refill takes less than 2 seconds.     Comments: Diffuse abrasions related to tick bites  Neurological:     General: No focal deficit present.     Mental Status: He is alert and oriented to person, place, and time.  Psychiatric:        Mood and Affect: Mood normal.        Behavior: Behavior normal.        Thought Content: Thought content normal.        Judgment: Judgment normal.    BP (!) 122/57   Pulse 74   Temp 98.2 F (36.8 C)   Ht '5\' 10"'  (1.778 m)   Wt 217 lb 6.4 oz (98.6 kg)   SpO2 98%   BMI 31.19 kg/m  Wt Readings from Last 3 Encounters:  07/24/21 217 lb 6.4 oz (98.6 kg)  07/24/21 222 lb (100.7 kg)  07/08/21 222 lb (100.7 kg)    Immunization History  Administered Date(s) Administered   Influenza Split 11/20/2020   Influenza, High Dose Seasonal PF 11/24/2019   Influenza,inj,Quad PF,6+ Mos 11/04/2016, 12/27/2017, 11/09/2018   Influenza-Unspecified 11/16/2015, 12/27/2017, 12/27/2017, 12/12/2020   Moderna Sars-Covid-2 Vaccination 05/13/2019, 06/15/2019, 02/23/2020, 12/20/2020   Pneumococcal Polysaccharide-23 08/04/2015, 11/09/2018   Td 03/07/1995   Tdap 09/26/2012, 07/14/2017    Diabetic Foot Exam - Simple   No data filed     Lab Results  Component Value Date   TSH 4.000 08/15/2020   Lab Results  Component Value Date   WBC 9.7 08/15/2020   HGB 14.9 08/15/2020   HCT 45.0 08/15/2020   MCV 93 08/15/2020   PLT 154 08/15/2020   Lab Results  Component Value Date   NA 142 05/02/2021   K 4.6 05/02/2021  CO2 20 05/06/2019   GLUCOSE 166 (H) 05/02/2021   BUN 11 05/02/2021   CREATININE 0.77 05/02/2021   BILITOT 1.0 05/02/2021   ALKPHOS 81 05/02/2021   AST 19 05/02/2021   ALT 49 (H) 05/06/2019   PROT 6.5 05/02/2021   ALBUMIN 4.3 05/02/2021   CALCIUM 9.2 05/02/2021   EGFR 98 05/02/2021   GFR 92.83 08/03/2018   Lab Results  Component Value Date   CHOL 219 (H) 05/02/2021   CHOL 247 (H) 01/31/2021   CHOL 219 (H) 08/15/2020    Lab Results  Component Value Date   HDL 51 05/02/2021   HDL 49 01/31/2021   HDL 51 08/15/2020   Lab Results  Component Value Date   LDLCALC 139 (H) 05/02/2021   LDLCALC 159 (H) 01/31/2021   LDLCALC 128 (H) 08/15/2020   Lab Results  Component Value Date   TRIG 164 (H) 05/02/2021   TRIG 211 (H) 01/31/2021   TRIG 226 (H) 08/15/2020   Lab Results  Component Value Date   CHOLHDL 4.3 05/02/2021   CHOLHDL 5.0 01/31/2021   CHOLHDL 4.3 08/15/2020   Lab Results  Component Value Date   HGBA1C 8.4 (A) 05/02/2021   HGBA1C 8.4 05/02/2021   HGBA1C 8.4 (A) 05/02/2021   HGBA1C 8.4 (A) 05/02/2021       Assessment & Plan:   Problem List Items Addressed This Visit       Musculoskeletal and Integument   Tick bite - Primary   Relevant Medications   doxycycline (VIBRA-TABS) 100 MG tablet, prescribed as prophylaxis for lyme disease  Discussed non pharmacological methods for management of symptoms Informed to take OTC medications as needed Given anticipatory guidance   Maintain upcoming follow up on 6/2 for reevaluation, sooner as needed     I am having [name] [name] start on doxycycline. I am also having him maintain his aspirin, Visual merchandiser Next Monitor, Haematologist, azelastine, Bayer Contour Next Test, fluvastatin XL, lisinopril, metFORMIN, pantoprazole, Polyethylene Glycol 3350 (MIRALAX PO), and gabapentin.  Meds ordered this encounter  Medications   doxycycline (VIBRA-TABS) 100 MG tablet    Sig: Take 2 tablets (200 mg total) by mouth once for 1 dose.    Dispense:  2 tablet    Refill:  0     Teena Dunk, NP

## 2021-07-24 NOTE — Patient Instructions (Signed)
Handouts on polyps, diverticulosis, and hemorrhoids.  Await pathology results.  Please see PCP or urgent care for follow up of tick bite/tick removal on left buttock (evaluation for need for antibiotics and to ensure tick head was removed.  YOU HAD AN ENDOSCOPIC PROCEDURE TODAY AT [facility]:   Refer to the procedure report that was given to you for any specific questions about what was found during the examination.  If the procedure report does not answer your questions, please call your gastroenterologist to clarify.  If you requested that your care partner not be given the details of your procedure findings, then the procedure report has been included in a sealed envelope for you to review at your convenience later.  YOU SHOULD EXPECT: Some feelings of bloating in the abdomen. Passage of more gas than usual.  Walking can help get rid of the air that was put into your GI tract during the procedure and reduce the bloating. If you had a lower endoscopy (such as a colonoscopy or flexible sigmoidoscopy) you may notice spotting of blood in your stool or on the toilet paper. If you underwent a bowel prep for your procedure, you may not have a normal bowel movement for a few days.  Please Note:  You might notice some irritation and congestion in your nose or some drainage.  This is from the oxygen used during your procedure.  There is no need for concern and it should clear up in a day or so.  SYMPTOMS TO REPORT IMMEDIATELY:  Following lower endoscopy (colonoscopy or flexible sigmoidoscopy):  Excessive amounts of blood in the stool  Significant tenderness or worsening of abdominal pains  Swelling of the abdomen that is new, acute  Fever of 100F or higher   For urgent or emergent issues, a gastroenterologist can be reached at any hour by calling [phone]. Do not use MyChart messaging for urgent concerns.    DIET:  We do recommend a small meal at first, but then you may  proceed to your regular diet.  Drink plenty of fluids but you should avoid alcoholic beverages for 24 hours.  ACTIVITY:  You should plan to take it easy for the rest of today and you should NOT DRIVE or use heavy machinery until tomorrow (because of the sedation medicines used during the test).    FOLLOW UP: Our staff will call the number listed on your records 48-72 hours following your procedure to check on you and address any questions or concerns that you may have regarding the information given to you following your procedure. If we do not reach you, we will leave a message.  We will attempt to reach you two times.  During this call, we will ask if you have developed any symptoms of COVID 19. If you develop any symptoms (ie: fever, flu-like symptoms, shortness of breath, cough etc.) before then, please call [phone].  If you test positive for Covid 19 in the 2 weeks post procedure, please call and report this information to [place].    If any biopsies were taken you will be contacted by phone or by letter within the next 1-3 weeks.  Please call us at [phone] if you have not heard about the biopsies in 3 weeks.    SIGNATURES/CONFIDENTIALITY: You and/or your care partner have signed paperwork which will be entered into your electronic medical record.  These signatures attest to the fact that that the information above on your After Visit Summary has  been reviewed and is understood.  Full responsibility of the confidentiality of this discharge information lies with you and/or your care-partner.  

## 2021-07-24 NOTE — Progress Notes (Signed)
Called to room to assist during endoscopic procedure.  Patient ID and intended procedure confirmed with present staff. Received instructions for my participation in the procedure from the performing physician.  

## 2021-07-24 NOTE — Progress Notes (Signed)
Pt non-responsive, VVS, Report to RN  °

## 2021-07-24 NOTE — Op Note (Signed)
[place] Patient Name: [name] Procedure Date: 07/24/2021 9:45 AM MRN: [id number] Endoscopist: Remo Lipps P. Havery Moros , MD Age: 71 Referring MD:  Date of Birth: 05/14/1950 Gender: Male Account #: [id number] Procedure:                Colonoscopy Indications:              Screening for colorectal malignant neoplasm, This                            is the patient's first colonoscopy - of note                            patient endorses multiple tick bites recently which                            has caused illness, not seen anyone for care for                            this Medicines:                Monitored Anesthesia Care Procedure:                Pre-Anesthesia Assessment:                           - Prior to the procedure, a History and Physical                            was performed, and patient medications and                            allergies were reviewed. The patient's tolerance of                            previous anesthesia was also reviewed. The risks                            and benefits of the procedure and the sedation                            options and risks were discussed with the patient.                            All questions were answered, and informed consent                            was obtained. Prior Anticoagulants: The patient has                            taken no previous anticoagulant or antiplatelet                            agents. ASA Grade Assessment: III - A patient with  severe systemic disease. After reviewing the risks                            and benefits, the patient was deemed in                            satisfactory condition to undergo the procedure.                           After obtaining informed consent, the colonoscope                            was passed under direct vision. Throughout the                            procedure, the patient's blood pressure, pulse, and                             oxygen saturations were monitored continuously. The                            CF [id number] #[id number] was introduced through the anus                            and advanced to the the cecum, identified by                            appendiceal orifice and ileocecal valve. The                            colonoscopy was performed without difficulty. The                            patient tolerated the procedure well. The quality                            of the bowel preparation was adequate. The                            ileocecal valve, appendiceal orifice, and rectum                            were photographed. Scope In: 10:09:33 AM Scope Out: 10:34:38 AM Scope Withdrawal Time: 0 hours 21 minutes 42 seconds  Total Procedure Duration: 0 hours 25 minutes 5 seconds  Findings:                 The perianal and digital rectal examinations were                            normal. There was a deeply embedded tick in the                            left buttock with a swollen / hard base to it. The  the tick was removed with tweezers per patient                            request. Given it was so deeply buried I cannot say                            for certain if a portion of the head of the tick                            remains embedded or not.                           Two sessile polyps were found in the cecum. The                            polyps were 2 to 3 mm in size. These polyps were                            removed with a cold snare. Resection and retrieval                            were complete.                           Three sessile polyps were found in the ascending                            colon. The polyps were 2 to 4 mm in size. These                            polyps were removed with a cold snare. Resection                            and retrieval were complete.                           Two flat and sessile polyps were  found in the                            transverse colon. The polyps were 3 to 4 mm in                            size. These polyps were removed with a cold snare.                            Resection and retrieval were complete.                           Two sessile polyps were found in the splenic                            flexure. The polyps were 3 mm in size. These  polyps                            were removed with a cold snare. Resection and                            retrieval were complete.                           A 4 mm polyp was found in the recto-sigmoid colon.                            The polyp was sessile. The polyp was removed with a                            cold snare. Resection and retrieval were complete.                           A few small-mouthed diverticula were found in the                            sigmoid colon.                           Internal hemorrhoids were found during retroflexion.                           The exam was otherwise without abnormality. Complications:            No immediate complications. Estimated blood loss:                            Minimal. Estimated Blood Loss:     Estimated blood loss was minimal. Impression:               - Two 2 to 3 mm polyps in the cecum, removed with a                            cold snare. Resected and retrieved.                           - Three 2 to 4 mm polyps in the ascending colon,                            removed with a cold snare. Resected and retrieved.                           - Two 3 to 4 mm polyps in the transverse colon,                            removed with a cold snare. Resected and retrieved.                           - Two 3 mm polyps at the splenic flexure, removed  with a cold snare. Resected and retrieved.                           - One 4 mm polyp at the recto-sigmoid colon,                            removed with a cold snare. Resected and retrieved.                            - Diverticulosis in the sigmoid colon.                           - Internal hemorrhoids.                           - The examination was otherwise normal.                           - Tick removal from the left buttock as described. Recommendation:           - Patient has a contact number available for                            emergencies. The signs and symptoms of potential                            delayed complications were discussed with the                            patient. Return to normal activities tomorrow.                            Written discharge instructions were provided to the                            patient.                           - Resume previous diet.                           - Continue present medications.                           - Await pathology results.                           - Recommend the patient see his PCP or urgent care                            to make sure tick has been completely removed and                            consideration for doxycycline given numerous tick  bites in recent days associated with illness Carlota Raspberry. Havery Moros, MD 07/24/2021 10:44:59 AM This report has been signed electronically.

## 2021-07-24 NOTE — Progress Notes (Signed)
Pt's states no medical or surgical changes since previsit or office visit.   Pts CBG was 65. Started D5 W KVO per protocol. Will recheck at 915.  PT stated that he had found several ticks on him from working in the garden in recent days. This morning he noticed one on his left Buttocks but was unable to remove.it.

## 2021-07-24 NOTE — Progress Notes (Signed)
[facility] History and Physical   Primary Care Physician:  Pcp, No   Reason for Procedure:   Colon cancer screening  Plan:    colonoscopy     HPI: [name] is a 71 y.o. male  here for colonoscopy screening - first time exam. Patient denies any bowel symptoms at this time other than occasional straining. He has a history of prostate cancer s/p multiple surgeries but no radiation. Grandparent had colon cancer. Otherwise feels well without any cardiopulmonary symptoms. Of note he complains of a tick on his buttock and asking for removal today.I have discussed risks / benefits of colonoscopy and he wants to proceed.   Past Medical History:  Diagnosis Date   Allergy    seasonal   Arthritis    " all over me- back, hips, knees, hands, everywhere "   Cancer [facility])    Prostate cancer age 72; Lupron treatments followed by prostatectomy.   Cataract    left removed, forming right   Chronic constipation    x 23 yrs after prostate surgery   Diabetes mellitus without complication (HCC)    Diabetic retinal damage of both eyes ([place])    gets shots every month   GERD (gastroesophageal reflux disease)    Hyperlipidemia    Hypertension    Myocardial infarction [facility])    age 16; no stenting.   OSA (obstructive sleep apnea)    non-compliant with CPAP.   Sleep apnea    no cpap    Past Surgical History:  Procedure Laterality Date   APPENDECTOMY     CHOLECYSTECTOMY     PROSTATE SURGERY      Prior to Admission medications   Medication Sig Start Date End Date Taking? Authorizing Provider  aspirin 81 MG tablet Take 81 mg by mouth daily.   Yes [provider]  BAYER CONTOUR NEXT TEST test strip CHECK BLOOD SUGAR TWICE DAILY 06/03/16  Yes Elayne Snare, MD  BAYER MICROLET LANCETS lancets Check blood sugar two times daily. 08/27/15  Yes Elayne Snare, MD  Blood Glucose Monitoring Suppl (BAYER CONTOUR NEXT MONITOR) w/Device KIT Check blood sugar two times daily. 08/27/15   Yes Elayne Snare, MD  gabapentin (NEURONTIN) 300 MG capsule TAKE 3 CAPSULES (900 MG TOTAL) BY MOUTH 2 (TWO) TIMES DAILY. 07/23/21 08/22/21 Yes Passmore, Jake Church I, NP  metFORMIN (GLUCOPHAGE-XR) 500 MG 24 hr tablet Take 1 tablet (500 mg total) by mouth daily with breakfast. 4 tablets daily Patient taking differently: Take 500 mg by mouth daily with breakfast. 3 tablets BID 08/17/20 08/17/21 Yes Vevelyn Francois, NP  pantoprazole (PROTONIX) 40 MG tablet Take 1 tablet (40 mg total) by mouth 2 (two) times daily before a meal. 05/02/21 07/31/21 Yes King, Diona Foley, NP  Polyethylene Glycol 3350 (MIRALAX PO) Take by mouth as needed.   Yes [provider]  azelastine (ASTELIN) 0.1 % nasal spray PLACE 2 SPRAYS INTO BOTH NOSTRILS 2 (TWO) TIMES DAILY. USE IN [facility] NOSTRIL AS DIRECTED 05/03/16   Tereasa Coop, PA-C  fluvastatin XL (LESCOL XL) 80 MG 24 hr tablet Take 1 tablet (80 mg total) by mouth daily. start taking every other day for the first 2 weeks then daily 06/02/19   Elayne Snare, MD  lisinopril (ZESTRIL) 5 MG tablet Take 1 tablet (5 mg total) by mouth daily. 08/17/20   Vevelyn Francois, NP    Current Outpatient Medications  Medication Sig Dispense Refill   aspirin 81 MG tablet Take 81 mg by  mouth daily.     BAYER CONTOUR NEXT TEST test strip CHECK BLOOD SUGAR TWICE DAILY 100 each 5   BAYER MICROLET LANCETS lancets Check blood sugar two times daily. 100 each 5   Blood Glucose Monitoring Suppl (BAYER CONTOUR NEXT MONITOR) w/Device KIT Check blood sugar two times daily. 1 kit 2   gabapentin (NEURONTIN) 300 MG capsule TAKE 3 CAPSULES (900 MG TOTAL) BY MOUTH 2 (TWO) TIMES DAILY. 180 capsule 0   metFORMIN (GLUCOPHAGE-XR) 500 MG 24 hr tablet Take 1 tablet (500 mg total) by mouth daily with breakfast. 4 tablets daily (Patient taking differently: Take 500 mg by mouth daily with breakfast. 3 tablets BID) 90 tablet 3   pantoprazole (PROTONIX) 40 MG tablet Take 1 tablet (40 mg total) by mouth 2 (two) times daily  before a meal. 60 tablet 2   Polyethylene Glycol 3350 (MIRALAX PO) Take by mouth as needed.     azelastine (ASTELIN) 0.1 % nasal spray PLACE 2 SPRAYS INTO BOTH NOSTRILS 2 (TWO) TIMES DAILY. USE IN EACH NOSTRIL AS DIRECTED 30 mL 1   fluvastatin XL (LESCOL XL) 80 MG 24 hr tablet Take 1 tablet (80 mg total) by mouth daily. start taking every other day for the first 2 weeks then daily 30 tablet 3   lisinopril (ZESTRIL) 5 MG tablet Take 1 tablet (5 mg total) by mouth daily. 90 tablet 3   Current Facility-Administered Medications  Medication Dose Route Frequency Provider Last Rate Last Admin   0.9 %  sodium chloride infusion  500 mL Intravenous Once Matti Killingsworth, Carlota Raspberry, MD        Allergies as of 07/24/2021 - Review Complete 07/24/2021  Allergen Reaction Noted   Cymbalta [duloxetine hcl] Nausea And Vomiting 12/18/2014   Darvocet [propoxyphene n-acetaminophen] Hives 11/09/2011    Family History  Problem Relation Age of Onset   Cancer Mother        leukemia   Diabetes Mother    Cancer Father 65       lung cancer with mets   Colon cancer Maternal Grandmother    Heart disease Maternal Grandmother    Heart disease Maternal Grandfather    Heart disease Paternal Grandmother    Cancer Paternal Grandfather        lung, prostate   Esophageal cancer Neg Hx    Colon polyps Neg Hx    Stomach cancer Neg Hx    Rectal cancer Neg Hx     Social History   Socioeconomic History   Marital status: Single    Spouse name: GF-Betty   Number of children: 2   Years of education: 11   Highest education level: Not on file  Occupational History   Occupation: Buyer, retail: [employer]   Tobacco Use   Smoking status: Never   Smokeless tobacco: Current    Types: Chew  Vaping Use   Vaping Use: Never used  Substance and Sexual Activity   Alcohol use: Yes    Comment: social   Drug use: Not Currently   Sexual activity: Not Currently  Other Topics Concern   Not on file  Social  History Narrative   Marital status: divorced, girlfriend x 1977     Children: 1      Tobacco: none; chew tobacco      Alcohol: beers on weekends      Exercise: none   Social Determinants of Health   Financial Resource Strain: Low Risk    Difficulty of Paying Living  Expenses: Not hard at all  Food Insecurity: Food Insecurity Present   Worried About Charity fundraiser in the Last Year: Sometimes true   Arboriculturist in the Last Year: Sometimes true  Transportation Needs: No Transportation Needs   Lack of Transportation (Medical): No   Lack of Transportation (Non-Medical): No  Physical Activity: Insufficiently Active   Days of Exercise per Week: 7 days   Minutes of Exercise per Session: 10 min  Stress: Stress Concern Present   Feeling of Stress : To some extent  Social Connections: Moderately Isolated   Frequency of Communication with Friends and Family: More than three times a week   Frequency of Social Gatherings with Friends and Family: More than three times a week   Attends Religious Services: More than 4 times per year   Active Member of [employer] or Organizations: No   Attends Archivist Meetings: Never   Marital Status: Divorced  Human resources officer Violence: Not At Risk   Fear of Current or Ex-Partner: No   Emotionally Abused: No   Physically Abused: No   Sexually Abused: No    Review of Systems: All other review of systems negative except as mentioned in the HPI.  Physical Exam: Vital signs BP (!) 152/62   Pulse 63   Temp 98.2 F (36.8 C) (Temporal)   Ht '5\' 10"'  (1.778 m)   Wt 222 lb (100.7 kg)   SpO2 98%   BMI 31.85 kg/m   General:   Alert,  Well-developed, pleasant and cooperative in NAD Lungs:  Clear throughout to auscultation.   Heart:  Regular rate and rhythm Abdomen:  Soft, nontender and nondistended.   Neuro/Psych:  Alert and cooperative. Normal mood and affect. A and O x 3  Jolly Mango, MD [facility]

## 2021-07-24 NOTE — Patient Instructions (Signed)
You were seen today in the [facility] for post tick removal evaluation. You were prescribed medications, please take as directed. Please maintain upcoming follow up on 08/02/21.

## 2021-07-25 ENCOUNTER — Telehealth: Payer: Self-pay

## 2021-07-25 NOTE — Telephone Encounter (Signed)
  Follow up Call-     07/24/2021    8:50 AM  Call back number  Post procedure Call Back phone  # [phone]  Permission to leave phone message Yes     Patient questions:  Do you have a fever, pain , or abdominal swelling? No. Pain Score  0 *  Have you tolerated food without any problems? Yes.    Have you been able to return to your normal activities? Yes.    Do you have any questions about your discharge instructions: Diet   No. Medications  No. Follow up visit  No.  Do you have questions or concerns about your Care? No.  Actions: * If pain score is 4 or above: No action needed, pain <4.

## 2021-07-28 ENCOUNTER — Other Ambulatory Visit: Payer: Self-pay | Admitting: Nurse Practitioner

## 2021-07-31 ENCOUNTER — Encounter (INDEPENDENT_AMBULATORY_CARE_PROVIDER_SITE_OTHER): Payer: Medicare HMO | Admitting: Ophthalmology

## 2021-07-31 ENCOUNTER — Encounter: Payer: Self-pay | Admitting: Gastroenterology

## 2021-07-31 DIAGNOSIS — E113313 Type 2 diabetes mellitus with moderate nonproliferative diabetic retinopathy with macular edema, bilateral: Secondary | ICD-10-CM | POA: Diagnosis not present

## 2021-07-31 DIAGNOSIS — H43813 Vitreous degeneration, bilateral: Secondary | ICD-10-CM | POA: Diagnosis not present

## 2021-07-31 DIAGNOSIS — I10 Essential (primary) hypertension: Secondary | ICD-10-CM | POA: Diagnosis not present

## 2021-07-31 DIAGNOSIS — H35033 Hypertensive retinopathy, bilateral: Secondary | ICD-10-CM | POA: Diagnosis not present

## 2021-08-01 ENCOUNTER — Other Ambulatory Visit: Payer: Self-pay | Admitting: Nurse Practitioner

## 2021-08-02 ENCOUNTER — Ambulatory Visit: Payer: Medicare HMO | Admitting: Nurse Practitioner

## 2021-08-02 ENCOUNTER — Ambulatory Visit (INDEPENDENT_AMBULATORY_CARE_PROVIDER_SITE_OTHER): Payer: Medicare HMO | Admitting: Nurse Practitioner

## 2021-08-02 ENCOUNTER — Encounter: Payer: Self-pay | Admitting: Nurse Practitioner

## 2021-08-02 ENCOUNTER — Other Ambulatory Visit: Payer: Self-pay | Admitting: Nurse Practitioner

## 2021-08-02 VITALS — BP 130/68 | HR 78 | Temp 98.2°F | Ht 70.0 in | Wt 219.6 lb

## 2021-08-02 DIAGNOSIS — I10 Essential (primary) hypertension: Secondary | ICD-10-CM | POA: Diagnosis not present

## 2021-08-02 DIAGNOSIS — K219 Gastro-esophageal reflux disease without esophagitis: Secondary | ICD-10-CM

## 2021-08-02 DIAGNOSIS — G8929 Other chronic pain: Secondary | ICD-10-CM | POA: Diagnosis not present

## 2021-08-02 DIAGNOSIS — E1142 Type 2 diabetes mellitus with diabetic polyneuropathy: Secondary | ICD-10-CM | POA: Diagnosis not present

## 2021-08-02 DIAGNOSIS — E1140 Type 2 diabetes mellitus with diabetic neuropathy, unspecified: Secondary | ICD-10-CM | POA: Diagnosis not present

## 2021-08-02 DIAGNOSIS — E782 Mixed hyperlipidemia: Secondary | ICD-10-CM | POA: Diagnosis not present

## 2021-08-02 LAB — POCT GLYCOSYLATED HEMOGLOBIN (HGB A1C)
HbA1c POC (<> result, manual entry): 7 % (ref 4.0–5.6)
HbA1c, POC (controlled diabetic range): 7 % (ref 0.0–7.0)
HbA1c, POC (prediabetic range): 7 % — AB (ref 5.7–6.4)
Hemoglobin A1C: 7 % — AB (ref 4.0–5.6)

## 2021-08-02 MED ORDER — PANTOPRAZOLE SODIUM 40 MG PO TBEC: DELAYED_RELEASE_TABLET | ORAL | 0 refills | Status: DC

## 2021-08-02 MED ORDER — METFORMIN HCL ER 500 MG PO TB24: 500.0000 mg | ORAL_TABLET | Freq: Every day | ORAL | 3 refills | Status: DC

## 2021-08-02 MED ORDER — GABAPENTIN 300 MG PO CAPS: 900.0000 mg | ORAL_CAPSULE | Freq: Two times a day (BID) | ORAL | 2 refills | Status: DC

## 2021-08-02 MED ORDER — LISINOPRIL 5 MG PO TABS: 5.0000 mg | ORAL_TABLET | Freq: Every day | ORAL | 3 refills | Status: DC

## 2021-08-02 MED ORDER — METFORMIN HCL ER 500 MG PO TB24: ORAL_TABLET | ORAL | 5 refills | Status: DC

## 2021-08-02 NOTE — Progress Notes (Signed)
[address], [place]  [address] Phone:  [phone]   Fax:  [phone] Subjective:   Patient ID: [name], male    DOB: 1951/01/15, 71 y.o.   MRN: [id number]  Chief Complaint  Patient presents with   Follow-up    3 month follow up;Diabetes Patient needs medication refills.   HPI [name] 71 y.o. male  has a past medical history of Allergy, Arthritis, Cancer ([place]), Cataract, Chronic constipation, Diabetes mellitus without complication ([place]), Diabetic retinal damage of both eyes ([place]), GERD (gastroesophageal reflux disease), Hyperlipidemia, Hypertension, Myocardial infarction ([place]), OSA (obstructive sleep apnea), and Sleep apnea. To the [facility] for reevaluation of DM and tic bite.   Patient states that since previous visit, antibiotics have been effective in treating tick bites. States that many of the bites have began to heal. Denies any fever, musculoskeletal symptoms or other symptoms related to tick bites.   Diabetes Mellitus: Patient presents for follow up of diabetes. Symptoms: none. Denies any symptoms. Patient denies foot ulcerations, nausea, and polydipsia.  Evaluation to date has been included: hemoglobin A1C.  Home sugars: BGs range between 62 and 120 . Treatment to date: none. States that he has been compliant with medications and adhering to diet and exercise regimen.   Has not been taking cholesterol medications due to significant leg pain. Patient states that he would like to lose 20 additional lbs. Denies any other concerns today.  Denies any fatigue, chest pain, shortness of breath, HA or dizziness. Denies any blurred vision, numbness or tingling.  Past Medical History:  Diagnosis Date   Allergy    seasonal   Arthritis    " all over me- back, hips, knees, hands, everywhere "   Cancer [facility])    Prostate cancer age 35; Lupron treatments followed by prostatectomy.   Cataract    left removed, forming right    Chronic constipation    x 23 yrs after prostate surgery   Diabetes mellitus without complication (HCC)    Diabetic retinal damage of both eyes ([place])    gets shots every month   GERD (gastroesophageal reflux disease)    Hyperlipidemia    Hypertension    Myocardial infarction [facility])    age 89; no stenting.   OSA (obstructive sleep apnea)    non-compliant with CPAP.   Sleep apnea    no cpap    Past Surgical History:  Procedure Laterality Date   APPENDECTOMY     CHOLECYSTECTOMY     PROSTATE SURGERY      Family History  Problem Relation Age of Onset   Cancer Mother        leukemia   Diabetes Mother    Cancer Father 47       lung cancer with mets   Colon cancer Maternal Grandmother    Heart disease Maternal Grandmother    Heart disease Maternal Grandfather    Heart disease Paternal Grandmother    Cancer Paternal Grandfather        lung, prostate   Esophageal cancer Neg Hx    Colon polyps Neg Hx    Stomach cancer Neg Hx    Rectal cancer Neg Hx     Social History   Socioeconomic History   Marital status: Single    Spouse name: GF-Betty   Number of children: 2   Years of education: 11   Highest education level: Not on file  Occupational History  Occupation: Buyer, retail: [employer]   Tobacco Use   Smoking status: Never   Smokeless tobacco: Current    Types: Chew  Vaping Use   Vaping Use: Never used  Substance and Sexual Activity   Alcohol use: Yes    Comment: social   Drug use: Not Currently   Sexual activity: Not Currently  Other Topics Concern   Not on file  Social History Narrative   Marital status: divorced, girlfriend x 1977     Children: 1      Tobacco: none; chew tobacco      Alcohol: beers on weekends      Exercise: none   Social Determinants of Health   Financial Resource Strain: Low Risk    Difficulty of Paying Living Expenses: Not hard at all  Food Insecurity: Food Insecurity Present   Worried About Sales executive in the Last Year: Sometimes true   Arboriculturist in the Last Year: Sometimes true  Transportation Needs: No Transportation Needs   Lack of Transportation (Medical): No   Lack of Transportation (Non-Medical): No  Physical Activity: Insufficiently Active   Days of Exercise per Week: 7 days   Minutes of Exercise per Session: 10 min  Stress: Stress Concern Present   Feeling of Stress : To some extent  Social Connections: Moderately Isolated   Frequency of Communication with Friends and Family: More than three times a week   Frequency of Social Gatherings with Friends and Family: More than three times a week   Attends Religious Services: More than 4 times per year   Active Member of [employer] or Organizations: No   Attends Archivist Meetings: Never   Marital Status: Divorced  Human resources officer Violence: Not At Risk   Fear of Current or Ex-Partner: No   Emotionally Abused: No   Physically Abused: No   Sexually Abused: No    Outpatient Medications Prior to Visit  Medication Sig Dispense Refill   aspirin 81 MG tablet Take 81 mg by mouth daily.     azelastine (ASTELIN) 0.1 % nasal spray PLACE 2 SPRAYS INTO BOTH NOSTRILS 2 (TWO) TIMES DAILY. USE IN EACH NOSTRIL AS DIRECTED 30 mL 1   BAYER CONTOUR NEXT TEST test strip CHECK BLOOD SUGAR TWICE DAILY 100 each 5   BAYER MICROLET LANCETS lancets Check blood sugar two times daily. 100 each 5   Blood Glucose Monitoring Suppl (BAYER CONTOUR NEXT MONITOR) w/Device KIT Check blood sugar two times daily. 1 kit 2   fluvastatin XL (LESCOL XL) 80 MG 24 hr tablet Take 1 tablet (80 mg total) by mouth daily. start taking every other day for the first 2 weeks then daily 30 tablet 3   trimethoprim-polymyxin b (POLYTRIM) ophthalmic solution SMARTSIG:In Eye(s)     gabapentin (NEURONTIN) 300 MG capsule TAKE 3 CAPSULES (900 MG TOTAL) BY MOUTH 2 (TWO) TIMES DAILY. 180 capsule 0   lisinopril (ZESTRIL) 5 MG tablet Take 1 tablet (5 mg total) by mouth daily.  90 tablet 3   metFORMIN (GLUCOPHAGE-XR) 500 MG 24 hr tablet Take 1 tablet (500 mg total) by mouth daily with breakfast. 4 tablets daily (Patient taking differently: Take 500 mg by mouth daily with breakfast. 3 tablets BID) 90 tablet 3   pantoprazole (PROTONIX) 40 MG tablet TAKE 1 TABLET (40 MG TOTAL) BY MOUTH TWICE A DAY BEFORE MEALS 180 tablet 0   Polyethylene Glycol 3350 (MIRALAX PO) Take by mouth as needed. (Patient not  taking: Reported on 07/24/2021)     No facility-administered medications prior to visit.    Allergies  Allergen Reactions   Cymbalta [Duloxetine Hcl] Nausea And Vomiting   Darvocet [Propoxyphene N-Acetaminophen] Hives    Review of Systems  Constitutional:  Negative for chills, fever and malaise/fatigue.  Eyes: Negative.   Respiratory:  Negative for cough and shortness of breath.   Cardiovascular:  Negative for chest pain, palpitations and leg swelling.  Gastrointestinal:  Negative for abdominal pain, blood in stool, constipation, diarrhea, nausea and vomiting.  Musculoskeletal: Negative.   Skin:        See HPI  Neurological: Negative.   Psychiatric/Behavioral:  Negative for depression. The patient is not nervous/anxious.   All other systems reviewed and are negative.     Objective:    Physical Exam Vitals reviewed.  Constitutional:      General: He is not in acute distress.    Appearance: Normal appearance. He is obese.  HENT:     Head: Normocephalic.  Eyes:     General: No scleral icterus.       Right eye: No discharge.        Left eye: No discharge.     Extraocular Movements: Extraocular movements intact.     Conjunctiva/sclera: Conjunctivae normal.     Pupils: Pupils are equal, round, and reactive to light.  Neck:     Vascular: No carotid bruit.  Cardiovascular:     Rate and Rhythm: Normal rate and regular rhythm.     Pulses: Normal pulses.     Heart sounds: Normal heart sounds.     Comments: No obvious peripheral edema Pulmonary:     Effort:  Pulmonary effort is normal.     Breath sounds: Normal breath sounds.  Musculoskeletal:        General: No swelling, tenderness, deformity or signs of injury. Normal range of motion.     Cervical back: Normal range of motion and neck supple. No rigidity or tenderness.     Right lower leg: No edema.     Left lower leg: No edema.  Lymphadenopathy:     Cervical: No cervical adenopathy.  Skin:    General: Skin is warm and dry.     Capillary Refill: Capillary refill takes less than 2 seconds.  Neurological:     General: No focal deficit present.     Mental Status: He is alert and oriented to person, place, and time.  Psychiatric:        Mood and Affect: Mood normal.        Behavior: Behavior normal.        Thought Content: Thought content normal.        Judgment: Judgment normal.    BP 130/68   Pulse 78   Temp 98.2 F (36.8 C)   Ht _0  (1.778 m)   Wt 219 lb 9.6 oz (99.6 kg)   SpO2 98%   BMI 31.51 kg/m  Wt Readings from Last 3 Encounters:  08/02/21 219 lb 9.6 oz (99.6 kg)  07/24/21 217 lb 6.4 oz (98.6 kg)  07/24/21 222 lb (100.7 kg)    Immunization History  Administered Date(s) Administered   Influenza Split 11/20/2020   Influenza, High Dose Seasonal PF 11/24/2019   Influenza,inj,Quad PF,6+ Mos 11/04/2016, 12/27/2017, 11/09/2018   Influenza-Unspecified 11/16/2015, 12/27/2017, 12/27/2017, 12/12/2020   Moderna Sars-Covid-2 Vaccination 05/13/2019, 06/15/2019, 02/23/2020, 12/20/2020   Pneumococcal Polysaccharide-23 08/04/2015, 11/09/2018   Td 03/07/1995   Tdap 09/26/2012, 07/14/2017  Diabetic Foot Exam - Simple   No data filed     Lab Results  Component Value Date   TSH 4.000 08/15/2020   Lab Results  Component Value Date   WBC 9.7 08/15/2020   HGB 14.9 08/15/2020   HCT 45.0 08/15/2020   MCV 93 08/15/2020   PLT 154 08/15/2020   Lab Results  Component Value Date   NA 142 05/02/2021   K 4.6 05/02/2021   CO2 20 05/06/2019   GLUCOSE 166 (H) 05/02/2021    BUN 11 05/02/2021   CREATININE 0.77 05/02/2021   BILITOT 1.0 05/02/2021   ALKPHOS 81 05/02/2021   AST 19 05/02/2021   ALT 49 (H) 05/06/2019   PROT 6.5 05/02/2021   ALBUMIN 4.3 05/02/2021   CALCIUM 9.2 05/02/2021   EGFR 98 05/02/2021   GFR 92.83 08/03/2018   Lab Results  Component Value Date   CHOL 189 08/02/2021   CHOL 219 (H) 05/02/2021   CHOL 247 (H) 01/31/2021   Lab Results  Component Value Date   HDL 39 (L) 08/02/2021   HDL 51 05/02/2021   HDL 49 01/31/2021   Lab Results  Component Value Date   LDLCALC 104 (H) 08/02/2021   LDLCALC 139 (H) 05/02/2021   LDLCALC 159 (H) 01/31/2021   Lab Results  Component Value Date   TRIG 272 (H) 08/02/2021   TRIG 164 (H) 05/02/2021   TRIG 211 (H) 01/31/2021   Lab Results  Component Value Date   CHOLHDL 4.8 08/02/2021   CHOLHDL 4.3 05/02/2021   CHOLHDL 5.0 01/31/2021   Lab Results  Component Value Date   HGBA1C 7.0 (A) 08/02/2021   HGBA1C 7.0 08/02/2021   HGBA1C 7.0 (A) 08/02/2021   HGBA1C 7.0 08/02/2021       Assessment & Plan:   Problem List Items Addressed This Visit       Endocrine   DM (diabetes mellitus) ([place]) - Primary   Relevant Medications   gabapentin (NEURONTIN) 300 MG capsule   lisinopril (ZESTRIL) 5 MG tablet   metFORMIN (GLUCOPHAGE-XR) 500 MG 24 hr tablet   Other Relevant Orders   HgB A1c (Completed): 7.0, at goal Medications refilled without change  Encouraged continued diet and exercise efforts  Encouraged continued compliance with medication       Other   Other chronic pain   Relevant Medications   gabapentin (NEURONTIN) 300 MG capsule, refilled without change   Other Visit Diagnoses     Gastroesophageal reflux disease without esophagitis       Relevant Medications   pantoprazole (PROTONIX) 40 MG tablet, refilled without change Discussed non pharmacological methods for management of symptoms Informed to take OTC medications as needed    Mixed hyperlipidemia       Relevant  Medications   lisinopril (ZESTRIL) 5 MG tablet, refilled without change  Encouraged continued diet and exercise efforts  Encouraged continued compliance with medication     Other Relevant Orders   Lipid panel (Completed)   Primary hypertension       Relevant Medications   lisinopril (ZESTRIL) 5 MG tablet, refilled without change  Encouraged to continued checking B/P at home regularly  Encouraged continued diet and exercise efforts  Encouraged continued compliance with medication     Follow up in 3 mths for reevaluation of DM and hyperlipidemia, sooner as needed     I have discontinued Loralee Pacas. Waid Jr.'s metFORMIN. I have also changed his metFORMIN. Additionally, I am having him maintain his aspirin, Bayer Contour Next  Monitor, Haematologist, azelastine, Bayer Contour Next Test, fluvastatin XL, Polyethylene Glycol 3350 (MIRALAX PO), trimethoprim-polymyxin b, gabapentin, lisinopril, and pantoprazole.  Meds ordered this encounter  Medications   gabapentin (NEURONTIN) 300 MG capsule    Sig: Take 3 capsules (900 mg total) by mouth 2 (two) times daily.    Dispense:  180 capsule    Refill:  2    DX Code Needed  .   lisinopril (ZESTRIL) 5 MG tablet    Sig: Take 1 tablet (5 mg total) by mouth daily.    Dispense:  90 tablet    Refill:  3   DISCONTD: metFORMIN (GLUCOPHAGE-XR) 500 MG 24 hr tablet    Sig: Take 1 tablet (500 mg total) by mouth daily with breakfast. 4 tablets daily    Dispense:  90 tablet    Refill:  3   pantoprazole (PROTONIX) 40 MG tablet    Sig: TAKE 1 TABLET (40 MG TOTAL) BY MOUTH TWICE A DAY BEFORE MEALS    Dispense:  180 tablet    Refill:  0   metFORMIN (GLUCOPHAGE-XR) 500 MG 24 hr tablet    Sig: Take 3 tablets (1,500 mg total) by mouth daily with breakfast AND 1 tablet (500 mg total) every evening. 4 tablets daily.    Dispense:  120 tablet    Refill:  5     Teena Dunk, NP

## 2021-08-02 NOTE — Patient Instructions (Signed)
You were seen today in the [facility] for reevaluation of tick bite. Labs were collected, results will be available via MyChart or, if abnormal, you will be contacted by clinic staff. You were prescribed medications, please take as directed. Please follow up in 3 mths for reevaluation of DM and hyperlipidemia.

## 2021-08-03 LAB — LIPID PANEL
Chol/HDL Ratio: 4.8 ratio (ref 0.0–5.0)
Cholesterol, Total: 189 mg/dL (ref 100–199)
HDL: 39 mg/dL — ABNORMAL LOW (ref >39)
LDL Chol Calc (NIH): 104 mg/dL — ABNORMAL HIGH (ref 0–99)
Triglycerides: 272 mg/dL — ABNORMAL HIGH (ref 0–149)
VLDL Cholesterol Cal: 46 mg/dL — ABNORMAL HIGH (ref 5–40)

## 2021-08-13 DIAGNOSIS — H26492 Other secondary cataract, left eye: Secondary | ICD-10-CM | POA: Diagnosis not present

## 2021-08-13 DIAGNOSIS — H25811 Combined forms of age-related cataract, right eye: Secondary | ICD-10-CM | POA: Diagnosis not present

## 2021-08-13 DIAGNOSIS — Z961 Presence of intraocular lens: Secondary | ICD-10-CM | POA: Diagnosis not present

## 2021-08-13 DIAGNOSIS — E119 Type 2 diabetes mellitus without complications: Secondary | ICD-10-CM | POA: Diagnosis not present

## 2021-08-13 DIAGNOSIS — E113413 Type 2 diabetes mellitus with severe nonproliferative diabetic retinopathy with macular edema, bilateral: Secondary | ICD-10-CM | POA: Diagnosis not present

## 2021-08-13 DIAGNOSIS — H2511 Age-related nuclear cataract, right eye: Secondary | ICD-10-CM | POA: Diagnosis not present

## 2021-08-13 DIAGNOSIS — H43812 Vitreous degeneration, left eye: Secondary | ICD-10-CM | POA: Diagnosis not present

## 2021-08-13 DIAGNOSIS — H35033 Hypertensive retinopathy, bilateral: Secondary | ICD-10-CM | POA: Diagnosis not present

## 2021-08-13 LAB — HM DIABETES EYE EXAM

## 2021-08-30 ENCOUNTER — Encounter (INDEPENDENT_AMBULATORY_CARE_PROVIDER_SITE_OTHER): Payer: Medicare HMO | Admitting: Ophthalmology

## 2021-08-30 DIAGNOSIS — H35033 Hypertensive retinopathy, bilateral: Secondary | ICD-10-CM

## 2021-08-30 DIAGNOSIS — E113313 Type 2 diabetes mellitus with moderate nonproliferative diabetic retinopathy with macular edema, bilateral: Secondary | ICD-10-CM | POA: Diagnosis not present

## 2021-08-30 DIAGNOSIS — I10 Essential (primary) hypertension: Secondary | ICD-10-CM | POA: Diagnosis not present

## 2021-08-30 DIAGNOSIS — H43813 Vitreous degeneration, bilateral: Secondary | ICD-10-CM

## 2021-09-25 DIAGNOSIS — J342 Deviated nasal septum: Secondary | ICD-10-CM | POA: Diagnosis not present

## 2021-09-25 DIAGNOSIS — J343 Hypertrophy of nasal turbinates: Secondary | ICD-10-CM | POA: Diagnosis not present

## 2021-09-25 DIAGNOSIS — J310 Chronic rhinitis: Secondary | ICD-10-CM | POA: Diagnosis not present

## 2021-09-27 ENCOUNTER — Encounter (INDEPENDENT_AMBULATORY_CARE_PROVIDER_SITE_OTHER): Payer: Medicare HMO | Admitting: Ophthalmology

## 2021-09-27 DIAGNOSIS — H35033 Hypertensive retinopathy, bilateral: Secondary | ICD-10-CM

## 2021-09-27 DIAGNOSIS — H43813 Vitreous degeneration, bilateral: Secondary | ICD-10-CM | POA: Diagnosis not present

## 2021-09-27 DIAGNOSIS — E113313 Type 2 diabetes mellitus with moderate nonproliferative diabetic retinopathy with macular edema, bilateral: Secondary | ICD-10-CM

## 2021-09-27 DIAGNOSIS — I10 Essential (primary) hypertension: Secondary | ICD-10-CM

## 2021-10-25 ENCOUNTER — Ambulatory Visit: Payer: Medicare HMO | Admitting: Pulmonary Disease

## 2021-10-27 ENCOUNTER — Other Ambulatory Visit: Payer: Self-pay | Admitting: Nurse Practitioner

## 2021-10-27 DIAGNOSIS — K219 Gastro-esophageal reflux disease without esophagitis: Secondary | ICD-10-CM

## 2021-10-30 ENCOUNTER — Encounter (INDEPENDENT_AMBULATORY_CARE_PROVIDER_SITE_OTHER): Payer: Medicare HMO | Admitting: Ophthalmology

## 2021-11-01 ENCOUNTER — Ambulatory Visit (INDEPENDENT_AMBULATORY_CARE_PROVIDER_SITE_OTHER): Payer: Medicare HMO | Admitting: Nurse Practitioner

## 2021-11-01 ENCOUNTER — Encounter: Payer: Self-pay | Admitting: Nurse Practitioner

## 2021-11-01 VITALS — BP 123/70 | HR 71 | Temp 97.9°F | Ht 70.0 in | Wt 217.4 lb

## 2021-11-01 DIAGNOSIS — M79672 Pain in left foot: Secondary | ICD-10-CM

## 2021-11-01 DIAGNOSIS — M79671 Pain in right foot: Secondary | ICD-10-CM

## 2021-11-01 DIAGNOSIS — I10 Essential (primary) hypertension: Secondary | ICD-10-CM

## 2021-11-01 DIAGNOSIS — E1140 Type 2 diabetes mellitus with diabetic neuropathy, unspecified: Secondary | ICD-10-CM | POA: Diagnosis not present

## 2021-11-01 DIAGNOSIS — L84 Corns and callosities: Secondary | ICD-10-CM | POA: Diagnosis not present

## 2021-11-01 DIAGNOSIS — G8929 Other chronic pain: Secondary | ICD-10-CM

## 2021-11-01 DIAGNOSIS — K219 Gastro-esophageal reflux disease without esophagitis: Secondary | ICD-10-CM | POA: Diagnosis not present

## 2021-11-01 DIAGNOSIS — E1142 Type 2 diabetes mellitus with diabetic polyneuropathy: Secondary | ICD-10-CM | POA: Diagnosis not present

## 2021-11-01 MED ORDER — GABAPENTIN 300 MG PO CAPS: 900.0000 mg | ORAL_CAPSULE | Freq: Two times a day (BID) | ORAL | 2 refills | Status: DC

## 2021-11-01 MED ORDER — LISINOPRIL 5 MG PO TABS: 5.0000 mg | ORAL_TABLET | Freq: Every day | ORAL | 3 refills | Status: DC

## 2021-11-01 MED ORDER — MELOXICAM 7.5 MG PO TABS: 7.5000 mg | ORAL_TABLET | Freq: Every day | ORAL | 0 refills | Status: DC

## 2021-11-01 MED ORDER — METFORMIN HCL ER 500 MG PO TB24: ORAL_TABLET | ORAL | 5 refills | Status: DC

## 2021-11-01 NOTE — Progress Notes (Signed)
'@Patient'  ID: Clayton Ruddle., male    DOB: 04-04-1950, 71 y.o.   MRN: [id number]  Chief Complaint  Patient presents with   Follow-up    Pt is here for 3 month's DM follow up visit. Pt states he has been having pain in both legs and feet. Pt states gabapentin is not helping his pain. Pt is requesting refill     Referring provider: No ref. provider found   HPI  Clayton Ruddle. 71 y.o. male  has a past medical history of Allergy, Arthritis, Cancer ([place]), Cataract, Chronic constipation, Diabetes mellitus without complication ([place]), Diabetic retinal damage of both eyes ([place]), GERD (gastroesophageal reflux disease), Hyperlipidemia, Hypertension, Myocardial infarction ([place]), OSA (obstructive sleep apnea), and Sleep apnea.  Diabetes Mellitus:   Patient presents for follow up of diabetes. Symptoms: none. Denies any symptoms. Patient denies foot ulcerations, nausea, and polydipsia.  Evaluation to date has been included: hemoglobin A1C. Home sugars: BGs range between 80-120. Treatment to date: none. States that he has been compliant with medications and adhering to diet and exercise regimen.   Neuropathy:  Legs hurt more at night. Left foot pain. Toes are crossing over. And has calluses on both feet.   Denies f/c/s, n/v/d, hemoptysis, PND, leg swelling Denies chest pain or edema      Allergies  Allergen Reactions   Cymbalta [Duloxetine Hcl] Nausea And Vomiting   Darvocet [Propoxyphene N-Acetaminophen] Hives    Immunization History  Administered Date(s) Administered   Influenza Split 11/20/2020   Influenza, High Dose Seasonal PF 11/24/2019   Influenza,inj,Quad PF,6+ Mos 11/04/2016, 12/27/2017, 11/09/2018   Influenza-Unspecified 11/16/2015, 12/27/2017, 12/27/2017, 12/12/2020   Moderna Sars-Covid-2 Vaccination 05/13/2019, 06/15/2019, 02/23/2020, 12/20/2020   Pneumococcal Polysaccharide-23 08/04/2015, 11/09/2018   Td 03/07/1995   Tdap 09/26/2012, 07/14/2017    Past Medical  History:  Diagnosis Date   Allergy    seasonal   Arthritis    " all over me- back, hips, knees, hands, everywhere "   Cancer [facility])    Prostate cancer age 72; Lupron treatments followed by prostatectomy.   Cataract    left removed, forming right   Chronic constipation    x 23 yrs after prostate surgery   Diabetes mellitus without complication (HCC)    Diabetic retinal damage of both eyes ([place])    gets shots every month   GERD (gastroesophageal reflux disease)    Hyperlipidemia    Hypertension    Myocardial infarction [facility])    age 13; no stenting.   OSA (obstructive sleep apnea)    non-compliant with CPAP.   Sleep apnea    no cpap    Tobacco History: Social History   Tobacco Use  Smoking Status Never  Smokeless Tobacco Current   Types: Chew   Ready to quit: Not Answered Counseling given: Not Answered   Outpatient Encounter Medications as of 11/01/2021  Medication Sig   aspirin 81 MG tablet Take 81 mg by mouth daily.   azelastine (ASTELIN) 0.1 % nasal spray PLACE 2 SPRAYS INTO BOTH NOSTRILS 2 (TWO) TIMES DAILY. USE IN EACH NOSTRIL AS DIRECTED   BAYER CONTOUR NEXT TEST test strip CHECK BLOOD SUGAR TWICE DAILY   BAYER MICROLET LANCETS lancets Check blood sugar two times daily.   Blood Glucose Monitoring Suppl (BAYER CONTOUR NEXT MONITOR) w/Device KIT Check blood sugar two times daily.   meloxicam (MOBIC) 7.5 MG tablet Take 1 tablet (7.5 mg total) by mouth daily.   pantoprazole (PROTONIX) 40 MG tablet TAKE 1 TABLET (  40 MG TOTAL) BY MOUTH TWICE A DAY BEFORE MEALS   trimethoprim-polymyxin b (POLYTRIM) ophthalmic solution SMARTSIG:In Eye(s)   [DISCONTINUED] lisinopril (ZESTRIL) 5 MG tablet Take 1 tablet (5 mg total) by mouth daily.   [DISCONTINUED] metFORMIN (GLUCOPHAGE-XR) 500 MG 24 hr tablet Take 3 tablets (1,500 mg total) by mouth daily with breakfast AND 1 tablet (500 mg total) every evening. 4 tablets daily.   fluvastatin XL (LESCOL XL) 80 MG 24 hr tablet Take 1 tablet (80  mg total) by mouth daily. start taking every other day for the first 2 weeks then daily (Patient not taking: Reported on 11/01/2021)   gabapentin (NEURONTIN) 300 MG capsule Take 3 capsules (900 mg total) by mouth 2 (two) times daily.   lisinopril (ZESTRIL) 5 MG tablet Take 1 tablet (5 mg total) by mouth daily.   metFORMIN (GLUCOPHAGE-XR) 500 MG 24 hr tablet Take 3 tablets (1,500 mg total) by mouth daily with breakfast AND 1 tablet (500 mg total) every evening. 4 tablets daily.   Polyethylene Glycol 3350 (MIRALAX PO) Take by mouth as needed. (Patient not taking: Reported on 07/24/2021)   [DISCONTINUED] gabapentin (NEURONTIN) 300 MG capsule Take 3 capsules (900 mg total) by mouth 2 (two) times daily.   No facility-administered encounter medications on file as of 11/01/2021.     Review of Systems  Review of Systems  Constitutional: Negative.   HENT: Negative.    Cardiovascular: Negative.   Gastrointestinal: Negative.   Musculoskeletal:  Positive for arthralgias and myalgias.  Allergic/Immunologic: Negative.   Neurological: Negative.   Psychiatric/Behavioral: Negative.         Physical Exam  BP 123/70 (BP Location: Left Arm, Patient Position: Sitting, Cuff Size: Large)   Pulse 71   Temp 97.9 F (36.6 C)   Ht '5\' 10"'  (1.778 m)   Wt 217 lb 6 oz (98.6 kg)   SpO2 99%   BMI 31.19 kg/m   Wt Readings from Last 5 Encounters:  11/01/21 217 lb 6 oz (98.6 kg)  08/02/21 219 lb 9.6 oz (99.6 kg)  07/24/21 217 lb 6.4 oz (98.6 kg)  07/24/21 222 lb (100.7 kg)  07/08/21 222 lb (100.7 kg)     Physical Exam Vitals and nursing note reviewed.  Constitutional:      General: He is not in acute distress.    Appearance: He is well-developed.  Cardiovascular:     Rate and Rhythm: Normal rate and regular rhythm.  Pulmonary:     Effort: Pulmonary effort is normal.     Breath sounds: Normal breath sounds.  Feet:     Right foot:     Skin integrity: Callus present.     Left foot:     Skin  integrity: Callus present.  Skin:    General: Skin is warm and dry.  Neurological:     Mental Status: He is alert and oriented to person, place, and time.      Lab Results:  CBC    Component Value Date/Time   WBC 9.7 08/15/2020 1051   WBC 5.2 08/03/2018 0920   RBC 4.83 08/15/2020 1051   RBC 4.65 08/03/2018 0920   HGB 14.9 08/15/2020 1051   HCT 45.0 08/15/2020 1051   PLT 154 08/15/2020 1051   MCV 93 08/15/2020 1051   MCH 30.8 08/15/2020 1051   MCH 31.1 08/08/2017 1249   MCHC 33.1 08/15/2020 1051   MCHC 34.2 08/03/2018 0920   RDW 12.5 08/15/2020 1051   LYMPHSABS 2.8 08/15/2020 1051   MONOABS  0.5 08/03/2018 0920   EOSABS 0.1 08/15/2020 1051   BASOSABS 0.0 08/15/2020 1051    BMET    Component Value Date/Time   NA 142 05/02/2021 0932   K 4.6 05/02/2021 0932   CL 103 05/02/2021 0932   CO2 20 05/06/2019 1054   GLUCOSE 166 (H) 05/02/2021 0932   GLUCOSE 180 (H) 08/03/2018 0920   BUN 11 05/02/2021 0932   CREATININE 0.77 05/02/2021 0932   CREATININE 0.86 08/04/2015 1401   CALCIUM 9.2 05/02/2021 0932   GFRNONAA 78 05/06/2019 1054   GFRNONAA >89 08/04/2015 1401   GFRAA 90 05/06/2019 1054   GFRAA >89 08/04/2015 1401    BNP No results found for: "BNP"  ProBNP No results found for: "PROBNP"  Imaging: No results found.   Assessment & Plan:   DM (diabetes mellitus) - Hemoglobin A1c - metFORMIN (GLUCOPHAGE-XR) 500 MG 24 hr tablet; Take 3 tablets (1,500 mg total) by mouth daily with breakfast AND 1 tablet (500 mg total) every evening. 4 tablets daily.  Dispense: 120 tablet; Refill: 5 - Ambulatory referral to Podiatry  2. Gastroesophageal reflux disease without esophagitis  - continue medications  3. Primary hypertension  - lisinopril (ZESTRIL) 5 MG tablet; Take 1 tablet (5 mg total) by mouth daily.  Dispense: 90 tablet; Refill: 3  4. Type 2 diabetes mellitus with diabetic neuropathy, without long-term current use of insulin (HCC)  - gabapentin (NEURONTIN)  300 MG capsule; Take 3 capsules (900 mg total) by mouth 2 (two) times daily.  Dispense: 180 capsule; Refill: 2  5. Other chronic pain  - gabapentin (NEURONTIN) 300 MG capsule; Take 3 capsules (900 mg total) by mouth 2 (two) times daily.  Dispense: 180 capsule; Refill: 2 - meloxicam (MOBIC) 7.5 MG tablet; Take 1 tablet (7.5 mg total) by mouth daily.  Dispense: 30 tablet; Refill: 0  6. Pain in both feet  - Ambulatory referral to Podiatry  7. Callus of foot  - Ambulatory referral to Podiatry   Follow up:  Follow up in 3 months or sooner if needed     Fenton Foy, NP 11/01/2021

## 2021-11-01 NOTE — Assessment & Plan Note (Signed)
-   Hemoglobin A1c - metFORMIN (GLUCOPHAGE-XR) 500 MG 24 hr tablet; Take 3 tablets (1,500 mg total) by mouth daily with breakfast AND 1 tablet (500 mg total) every evening. 4 tablets daily.  Dispense: 120 tablet; Refill: 5 - Ambulatory referral to Podiatry  2. Gastroesophageal reflux disease without esophagitis  - continue medications  3. Primary hypertension  - lisinopril (ZESTRIL) 5 MG tablet; Take 1 tablet (5 mg total) by mouth daily.  Dispense: 90 tablet; Refill: 3  4. Type 2 diabetes mellitus with diabetic neuropathy, without long-term current use of insulin (HCC)  - gabapentin (NEURONTIN) 300 MG capsule; Take 3 capsules (900 mg total) by mouth 2 (two) times daily.  Dispense: 180 capsule; Refill: 2  5. Other chronic pain  - gabapentin (NEURONTIN) 300 MG capsule; Take 3 capsules (900 mg total) by mouth 2 (two) times daily.  Dispense: 180 capsule; Refill: 2 - meloxicam (MOBIC) 7.5 MG tablet; Take 1 tablet (7.5 mg total) by mouth daily.  Dispense: 30 tablet; Refill: 0  6. Pain in both feet  - Ambulatory referral to Podiatry  7. Callus of foot  - Ambulatory referral to Podiatry   Follow up:  Follow up in 3 months or sooner if needed

## 2021-11-01 NOTE — Patient Instructions (Signed)
1. Type 2 diabetes mellitus with diabetic polyneuropathy, without long-term current use of insulin (HCC)  - Hemoglobin A1c - metFORMIN (GLUCOPHAGE-XR) 500 MG 24 hr tablet; Take 3 tablets (1,500 mg total) by mouth daily with breakfast AND 1 tablet (500 mg total) every evening. 4 tablets daily.  Dispense: 120 tablet; Refill: 5 - Ambulatory referral to Podiatry  2. Gastroesophageal reflux disease without esophagitis  - continue medications  3. Primary hypertension  - lisinopril (ZESTRIL) 5 MG tablet; Take 1 tablet (5 mg total) by mouth daily.  Dispense: 90 tablet; Refill: 3  4. Type 2 diabetes mellitus with diabetic neuropathy, without long-term current use of insulin (HCC)  - gabapentin (NEURONTIN) 300 MG capsule; Take 3 capsules (900 mg total) by mouth 2 (two) times daily.  Dispense: 180 capsule; Refill: 2  5. Other chronic pain  - gabapentin (NEURONTIN) 300 MG capsule; Take 3 capsules (900 mg total) by mouth 2 (two) times daily.  Dispense: 180 capsule; Refill: 2 - meloxicam (MOBIC) 7.5 MG tablet; Take 1 tablet (7.5 mg total) by mouth daily.  Dispense: 30 tablet; Refill: 0  6. Pain in both feet  - Ambulatory referral to Podiatry  7. Callus of foot  - Ambulatory referral to Podiatry   Follow up:  Follow up in 3 months or sooner if needed

## 2021-11-02 LAB — HEMOGLOBIN A1C
Est. average glucose Bld gHb Est-mCnc: 171 mg/dL
Hgb A1c MFr Bld: 7.6 % — ABNORMAL HIGH (ref 4.8–5.6)

## 2021-11-20 ENCOUNTER — Encounter (INDEPENDENT_AMBULATORY_CARE_PROVIDER_SITE_OTHER): Payer: Medicare HMO | Admitting: Ophthalmology

## 2021-11-20 DIAGNOSIS — E113313 Type 2 diabetes mellitus with moderate nonproliferative diabetic retinopathy with macular edema, bilateral: Secondary | ICD-10-CM

## 2021-11-20 DIAGNOSIS — H35033 Hypertensive retinopathy, bilateral: Secondary | ICD-10-CM

## 2021-11-20 DIAGNOSIS — H43813 Vitreous degeneration, bilateral: Secondary | ICD-10-CM

## 2021-11-20 DIAGNOSIS — I10 Essential (primary) hypertension: Secondary | ICD-10-CM | POA: Diagnosis not present

## 2021-11-22 ENCOUNTER — Ambulatory Visit: Payer: Medicare HMO | Admitting: Podiatry

## 2021-11-22 DIAGNOSIS — E1142 Type 2 diabetes mellitus with diabetic polyneuropathy: Secondary | ICD-10-CM | POA: Diagnosis not present

## 2021-11-22 DIAGNOSIS — E119 Type 2 diabetes mellitus without complications: Secondary | ICD-10-CM | POA: Diagnosis not present

## 2021-11-22 DIAGNOSIS — M79675 Pain in left toe(s): Secondary | ICD-10-CM | POA: Diagnosis not present

## 2021-11-22 DIAGNOSIS — B351 Tinea unguium: Secondary | ICD-10-CM | POA: Diagnosis not present

## 2021-11-22 DIAGNOSIS — M79674 Pain in right toe(s): Secondary | ICD-10-CM | POA: Diagnosis not present
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
## 2021-11-28 ENCOUNTER — Ambulatory Visit (INDEPENDENT_AMBULATORY_CARE_PROVIDER_SITE_OTHER): Payer: Medicare HMO | Admitting: Nurse Practitioner

## 2021-11-28 DIAGNOSIS — Z0000 Encounter for general adult medical examination without abnormal findings: Secondary | ICD-10-CM

## 2021-11-28 NOTE — Progress Notes (Signed)
Subjective:   Clayton Ponder. is a 71 y.o. male who presents for an Initial Medicare Annual Wellness Visit.  I connected with  [name]. on 11/28/21 by a audio enabled telemedicine application and verified that I am speaking with the correct person using two identifiers.  Patient Location: Home  Provider Location: Office/Clinic  I discussed the limitations of evaluation and management by telemedicine. The patient expressed understanding and agreed to proceed.        Objective:    There were no vitals filed for this visit. There is no height or weight on file to calculate BMI.     11/25/2020    1:11 PM 05/06/2019    9:13 AM 10/02/2016    1:48 PM  Advanced Directives  Does Patient Have a Medical Advance Directive? No Yes No  Type of Advance Directive  Living will     Current Medications (verified) Outpatient Encounter Medications as of 11/28/2021  Medication Sig   aspirin 81 MG tablet Take 81 mg by mouth daily.   azelastine (ASTELIN) 0.1 % nasal spray PLACE 2 SPRAYS INTO BOTH NOSTRILS 2 (TWO) TIMES DAILY. USE IN EACH NOSTRIL AS DIRECTED   BAYER CONTOUR NEXT TEST test strip CHECK BLOOD SUGAR TWICE DAILY   BAYER MICROLET LANCETS lancets Check blood sugar two times daily.   Blood Glucose Monitoring Suppl (BAYER CONTOUR NEXT MONITOR) w/Device KIT Check blood sugar two times daily.   gabapentin (NEURONTIN) 300 MG capsule Take 3 capsules (900 mg total) by mouth 2 (two) times daily.   lisinopril (ZESTRIL) 5 MG tablet Take 1 tablet (5 mg total) by mouth daily.   metFORMIN (GLUCOPHAGE-XR) 500 MG 24 hr tablet Take 3 tablets (1,500 mg total) by mouth daily with breakfast AND 1 tablet (500 mg total) every evening. 4 tablets daily.   pantoprazole (PROTONIX) 40 MG tablet TAKE 1 TABLET (40 MG TOTAL) BY MOUTH TWICE A DAY BEFORE MEALS   Polyethylene Glycol 3350 (MIRALAX PO) Take by mouth as needed.   trimethoprim-polymyxin b (POLYTRIM) ophthalmic solution SMARTSIG:In Eye(s)    fluvastatin XL (LESCOL XL) 80 MG 24 hr tablet Take 1 tablet (80 mg total) by mouth daily. start taking every other day for the first 2 weeks then daily (Patient not taking: Reported on 11/01/2021)   meloxicam (MOBIC) 7.5 MG tablet Take 1 tablet (7.5 mg total) by mouth daily. (Patient not taking: Reported on 11/28/2021)   No facility-administered encounter medications on file as of 11/28/2021.    Allergies (verified) Cymbalta [duloxetine hcl] and Darvocet [propoxyphene n-acetaminophen]   History: Past Medical History:  Diagnosis Date   Allergy    seasonal   Arthritis    " all over me- back, hips, knees, hands, everywhere "   Cancer [facility])    Prostate cancer age 23; Lupron treatments followed by prostatectomy.   Cataract    left removed, forming right   Chronic constipation    x 23 yrs after prostate surgery   Diabetes mellitus without complication (HCC)    Diabetic retinal damage of both eyes ([place])    gets shots every month   GERD (gastroesophageal reflux disease)    Hyperlipidemia    Hypertension    Myocardial infarction [facility])    age 89; no stenting.   OSA (obstructive sleep apnea)    non-compliant with CPAP.   Sleep apnea    no cpap   Past Surgical History:  Procedure Laterality Date   APPENDECTOMY     CHOLECYSTECTOMY  PROSTATE SURGERY     Family History  Problem Relation Age of Onset   Cancer Mother        leukemia   Diabetes Mother    Cancer Father 53       lung cancer with mets   Colon cancer Maternal Grandmother    Heart disease Maternal Grandmother    Heart disease Maternal Grandfather    Heart disease Paternal Grandmother    Cancer Paternal Grandfather        lung, prostate   Esophageal cancer Neg Hx    Colon polyps Neg Hx    Stomach cancer Neg Hx    Rectal cancer Neg Hx    Social History   Socioeconomic History   Marital status: Single    Spouse name: GF-Betty   Number of children: 2   Years of education: 11   Highest education level: Not on  file  Occupational History   Occupation: Buyer, retail: [employer]   Tobacco Use   Smoking status: Never   Smokeless tobacco: Current    Types: Chew  Vaping Use   Vaping Use: Never used  Substance and Sexual Activity   Alcohol use: Yes    Comment: social   Drug use: Not Currently   Sexual activity: Not Currently  Other Topics Concern   Not on file  Social History Narrative   Marital status: divorced, girlfriend x 1977     Children: 1      Tobacco: none; chew tobacco      Alcohol: beers on weekends      Exercise: none   Social Determinants of Health   Financial Resource Strain: Low Risk  (11/25/2020)   Overall Financial Resource Strain (CARDIA)    Difficulty of Paying Living Expenses: Not hard at all  Food Insecurity: Food Insecurity Present (11/25/2020)   Hunger Vital Sign    Worried About Running Out of Food in the Last Year: Sometimes true    Ran Out of Food in the Last Year: Sometimes true  Transportation Needs: No Transportation Needs (11/25/2020)   PRAPARE - Hydrologist (Medical): No    Lack of Transportation (Non-Medical): No  Physical Activity: Insufficiently Active (11/25/2020)   Exercise Vital Sign    Days of Exercise per Week: 7 days    Minutes of Exercise per Session: 10 min  Stress: Stress Concern Present (11/25/2020)   [place]    Feeling of Stress : To some extent  Social Connections: Moderately Isolated (11/25/2020)   Social Connection and Isolation Panel [NHANES]    Frequency of Communication with Friends and Family: More than three times a week    Frequency of Social Gatherings with Friends and Family: More than three times a week    Attends Religious Services: More than 4 times per year    Active Member of [employer] or Organizations: No    Attends Archivist Meetings: Never    Marital Status: Divorced    Tobacco Counseling Ready to  quit: Not Answered Counseling given: Not Answered   Clinical Intake:                 Diabetic?Yes          Activities of Daily Living    11/28/2021    4:06 PM  In your present state of health, do you have any difficulty performing the following activities:  Hearing? 1  Vision? 1  Difficulty concentrating or making decisions? 0  Walking or climbing stairs? 1  Dressing or bathing? 0    Patient Care Team: Tresa Garter, MD as PCP - General (Internal Medicine) Elayne Snare, MD as Consulting Physician (Endocrinology) Leta Baptist, MD as Consulting Physician (Otolaryngology) [facility] Hayden Pedro, MD as Consulting Physician (Ophthalmology) Tresa Garter, MD as Consulting Physician (Internal Medicine)  Indicate any recent Medical Services you may have received from other than [facility] providers in the past year (date may be approximate).     Assessment:   This is a routine wellness examination for [place].  Hearing/Vision screen No results found.  Dietary issues and exercise activities discussed: Exercise limited by: Other - see comments   Goals Addressed   None    Depression Screen    11/28/2021    4:01 PM 08/02/2021    8:58 AM 05/02/2021    8:48 AM 01/31/2021    9:54 AM 11/25/2020    1:05 PM 10/25/2020   11:18 AM 08/15/2020    9:17 AM  PHQ 2/9 Scores  PHQ - 2 Score 0 0 0 0 2 0 2  PHQ- 9 Score 4  0 0 10  5    Fall Risk    11/28/2021    4:01 PM 08/02/2021    8:58 AM 07/24/2021    3:09 PM 05/02/2021    8:47 AM 01/31/2021    9:54 AM  Fall Risk   Falls in the past year? 0 0 0 1 0  Number falls in past yr: 0 0 0 0 0  Injury with Fall? 0 0 0 1 0  Comment    bump over eye   Risk for fall due to : No Fall Risks      Follow up Falls evaluation completed Falls evaluation completed Falls evaluation completed      [place]:  Any stairs in or around the home? No  If so, are there any without  handrails? No  Home free of loose throw rugs in walkways, pet beds, electrical cords, etc? No  Adequate lighting in your home to reduce risk of falls? Yes   ASSISTIVE DEVICES UTILIZED TO PREVENT FALLS:  Life alert? Yes  Use of a cane, walker or w/c? No  Grab bars in the bathroom? Yes  Shower chair or bench in shower? No  Elevated toilet seat or a handicapped toilet? No   TIMED UP AND GO:  Was the test performed? No .  Length of time to ambulate 10 feet: NA sec.   Gait slow and steady without use of assistive device  Cognitive Function:        11/25/2020    1:14 PM 05/06/2019    9:05 AM  6CIT Screen  What Year? 0 points 0 points  What month? 0 points 0 points  What time? 0 points 0 points  Count back from 20 0 points 0 points  Months in reverse 4 points 2 points  Repeat phrase 2 points 2 points  Total Score 6 points 4 points    Immunizations Immunization History  Administered Date(s) Administered   Influenza Split 11/20/2020   Influenza, High Dose Seasonal PF 11/24/2019   Influenza,inj,Quad PF,6+ Mos 11/04/2016, 12/27/2017, 11/09/2018   Influenza-Unspecified 11/16/2015, 12/27/2017, 12/27/2017, 12/12/2020   Moderna Sars-Covid-2 Vaccination 05/13/2019, 06/15/2019, 02/23/2020, 12/20/2020   Pneumococcal Polysaccharide-23 08/04/2015, 11/09/2018   Td 03/07/1995   Tdap 09/26/2012, 07/14/2017    TDAP status: Up to  date  Flu Vaccine status: Due, Education has been provided regarding the importance of this vaccine. Advised may receive this vaccine at local pharmacy or Health Dept. Aware to provide a copy of the vaccination record if obtained from local pharmacy or Health Dept. Verbalized acceptance and understanding.  Pneumococcal vaccine status: Due, Education has been provided regarding the importance of this vaccine. Advised may receive this vaccine at local pharmacy or Health Dept. Aware to provide a copy of the vaccination record if obtained from local pharmacy or Health  Dept. Verbalized acceptance and understanding.  Covid-19 vaccine status: Information provided on how to obtain vaccines.   Qualifies for Shingles Vaccine? No   Zostavax completed No   Shingrix Completed?: No.    Education has been provided regarding the importance of this vaccine. Patient has been advised to call insurance company to determine out of pocket expense if they have not yet received this vaccine. Advised may also receive vaccine at local pharmacy or Health Dept. Verbalized acceptance and understanding.  Screening Tests Health Maintenance  Topic Date Due   Zoster Vaccines- Shingrix (1 of 2) Never done   Diabetic kidney evaluation - Urine ACR  08/03/2019   COVID-19 Vaccine (5 - Moderna series) 02/14/2021   INFLUENZA VACCINE  10/01/2021   Pneumonia Vaccine 79+ Years old (2 - PCV) 01/31/2022 (Originally 11/09/2019)   OPHTHALMOLOGY EXAM  12/13/2021   HEMOGLOBIN A1C  05/02/2022   Diabetic kidney evaluation - GFR measurement  05/03/2022   FOOT EXAM  11/23/2022   COLONOSCOPY (Pts 45-92yr Insurance coverage will need to be confirmed)  07/24/2024   TETANUS/TDAP  07/15/2027   Hepatitis C Screening  Completed   HPV VACCINES  Aged Out    Health Maintenance  Health Maintenance Due  Topic Date Due   Zoster Vaccines- Shingrix (1 of 2) Never done   Diabetic kidney evaluation - Urine ACR  08/03/2019   COVID-19 Vaccine (5 - Moderna series) 02/14/2021   INFLUENZA VACCINE  10/01/2021       Additional Screening:  Vision Screening: Recommended annual ophthalmology exams for early detection of glaucoma and other disorders of the eye. Is the patient up to date with their annual eye exam?  Yes  Who is the provider or what is the name of the office in which the patient attends annual eye exams? Patient stated he went to the eye doctor at the beginning of the year If pt is not established with a provider, would they like to be referred to a provider to establish care? No .   Dental  Screening: Recommended annual dental exams for proper oral hygiene  Community Resource Referral / Chronic Care Management: CRR required this visit?  No   CCM required this visit?  No      Plan:     I have personally reviewed and noted the following in the patient's chart:   Medical and social history Use of alcohol, tobacco or illicit drugs  Current medications and supplements including opioid prescriptions. Patient is not currently taking opioid prescriptions. Functional ability and status Nutritional status Physical activity Advanced directives List of other physicians Hospitalizations, surgeries, and ER visits in previous 12 months Vitals Screenings to include cognitive, depression, and falls Referrals and appointments  In addition, I have reviewed and discussed with patient certain preventive protocols, quality metrics, and best practice recommendations. A written personalized care plan for preventive services as well as general preventive health recommendations were provided to patient.     TSchuyler Amor  CMA   11/28/2021   Nurse Notes: This was a non face to face visit

## 2021-11-29 ENCOUNTER — Encounter: Payer: Self-pay | Admitting: Nurse Practitioner

## 2021-11-30 ENCOUNTER — Other Ambulatory Visit: Payer: Self-pay | Admitting: Nurse Practitioner

## 2021-11-30 DIAGNOSIS — G8929 Other chronic pain: Secondary | ICD-10-CM

## 2021-12-06 ENCOUNTER — Ambulatory Visit (INDEPENDENT_AMBULATORY_CARE_PROVIDER_SITE_OTHER): Payer: Medicare HMO | Admitting: Nurse Practitioner

## 2021-12-06 ENCOUNTER — Encounter: Payer: Self-pay | Admitting: Nurse Practitioner

## 2021-12-06 VITALS — BP 146/73 | HR 78 | Temp 98.1°F | Ht 70.0 in | Wt 220.0 lb

## 2021-12-06 DIAGNOSIS — T300 Burn of unspecified body region, unspecified degree: Secondary | ICD-10-CM | POA: Insufficient documentation

## 2021-12-06 DIAGNOSIS — T24209A Burn of second degree of unspecified site of unspecified lower limb, except ankle and foot, initial encounter: Secondary | ICD-10-CM

## 2021-12-06 DIAGNOSIS — T24201A Burn of second degree of unspecified site of right lower limb, except ankle and foot, initial encounter: Secondary | ICD-10-CM | POA: Insufficient documentation

## 2021-12-06 DIAGNOSIS — I10 Essential (primary) hypertension: Secondary | ICD-10-CM | POA: Diagnosis not present

## 2021-12-06 DIAGNOSIS — E1159 Type 2 diabetes mellitus with other circulatory complications: Secondary | ICD-10-CM | POA: Insufficient documentation

## 2021-12-06 MED ORDER — BACITRACIN 500 UNIT/GM EX OINT: 1.0000 | TOPICAL_OINTMENT | Freq: Two times a day (BID) | CUTANEOUS | 0 refills | Status: DC

## 2021-12-06 NOTE — Assessment & Plan Note (Addendum)
BP Readings from Last 3 Encounters:  12/06/21 (!) 146/73  11/01/21 123/70  08/02/21 130/68  Blood pressure is elevated in the office today but has always been well controlled.  currently on lisinopril 5 mg daily but he has not taken the medication today. No change made to his blood pressure medications today.

## 2021-12-06 NOTE — Progress Notes (Signed)
Established Patient Office Visit  Subjective:  Patient ID: Clayton Shober., male    DOB: Dec 22, 1950  Age: 71 y.o. MRN: [id number]  CC:  Chief Complaint  Patient presents with   Burn    Pt complaining of a burn from welding on his right ankle happened X3 days ago Pt has been cleaning and applying hydrocortisone cream with no improvement     HPI Clayton Schiller. is a 71 y.o. male with past medical history of obstructive sleep apnea, type 2 diabetes, neuropathy, secondary to above left lower leg, chronic pain, alcohol abuse presents for complaints of burn.   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 He has been applying Neosporin, washing the site  daily. Patient denies fever chills, has a little ache every now and then. Patient denies fever chills has  a little ache every now and then.   Past Medical History:  Diagnosis Date   Allergy    seasonal   Arthritis    " all over me- back, hips, knees, hands, everywhere "   Cancer [facility])    Prostate cancer age 25; Lupron treatments followed by prostatectomy.   Cataract    left removed, forming right   Chronic constipation    x 23 yrs after prostate surgery   Diabetes mellitus without complication (HCC)    Diabetic retinal damage of both eyes ([place])    gets shots every month   GERD (gastroesophageal reflux disease)    Hyperlipidemia    Hypertension    Myocardial infarction [facility])    age 56; no stenting.   OSA (obstructive sleep apnea)    non-compliant with CPAP.   Sleep apnea    no cpap    Past Surgical History:  Procedure Laterality Date   APPENDECTOMY     CHOLECYSTECTOMY     PROSTATE SURGERY      Family History  Problem Relation Age of Onset   Cancer Mother        leukemia   Diabetes Mother    Cancer Father 76       lung cancer with mets   Colon cancer Maternal Grandmother    Heart disease Maternal Grandmother    Heart disease Maternal Grandfather     Heart disease Paternal Grandmother    Cancer Paternal Grandfather        lung, prostate   Esophageal cancer Neg Hx    Colon polyps Neg Hx    Stomach cancer Neg Hx    Rectal cancer Neg Hx     Social History   Socioeconomic History   Marital status: Single    Spouse name: GF-Betty   Number of children: 2   Years of education: 11   Highest education level: Not on file  Occupational History   Occupation: Buyer, retail: [employer]   Tobacco Use   Smoking status: Never   Smokeless tobacco: Current    Types: Chew  Vaping Use   Vaping Use: Never used  Substance and Sexual Activity   Alcohol use: Yes    Comment: social   Drug use: Not Currently   Sexual activity: Not Currently  Other Topics Concern   Not on file  Social History Narrative   Marital status: divorced, girlfriend x 1977     Children: 1      Tobacco: none; chew tobacco      Alcohol: beers  on weekends      Exercise: none   Social Determinants of Health   Financial Resource Strain: Low Risk  (11/25/2020)   Overall Financial Resource Strain (CARDIA)    Difficulty of Paying Living Expenses: Not hard at all  Food Insecurity: Food Insecurity Present (11/25/2020)   Hunger Vital Sign    Worried About Running Out of Food in the Last Year: Sometimes true    Ran Out of Food in the Last Year: Sometimes true  Transportation Needs: No Transportation Needs (11/25/2020)   PRAPARE - Hydrologist (Medical): No    Lack of Transportation (Non-Medical): No  Physical Activity: Insufficiently Active (11/25/2020)   Exercise Vital Sign    Days of Exercise per Week: 7 days    Minutes of Exercise per Session: 10 min  Stress: Stress Concern Present (11/25/2020)   [place]    Feeling of Stress : To some extent  Social Connections: Moderately Isolated (11/25/2020)   Social Connection and Isolation Panel [NHANES]     Frequency of Communication with Friends and Family: More than three times a week    Frequency of Social Gatherings with Friends and Family: More than three times a week    Attends Religious Services: More than 4 times per year    Active Member of [employer] or Organizations: No    Attends Archivist Meetings: Never    Marital Status: Divorced  Human resources officer Violence: Not At Risk (11/25/2020)   Humiliation, Afraid, Rape, and Kick questionnaire    Fear of Current or Ex-Partner: No    Emotionally Abused: No    Physically Abused: No    Sexually Abused: No    Outpatient Medications Prior to Visit  Medication Sig Dispense Refill   aspirin 81 MG tablet Take 81 mg by mouth daily.     azelastine (ASTELIN) 0.1 % nasal spray PLACE 2 SPRAYS INTO BOTH NOSTRILS 2 (TWO) TIMES DAILY. USE IN EACH NOSTRIL AS DIRECTED 30 mL 1   BAYER CONTOUR NEXT TEST test strip CHECK BLOOD SUGAR TWICE DAILY 100 each 5   BAYER MICROLET LANCETS lancets Check blood sugar two times daily. 100 each 5   Blood Glucose Monitoring Suppl (BAYER CONTOUR NEXT MONITOR) w/Device KIT Check blood sugar two times daily. 1 kit 2   fluvastatin XL (LESCOL XL) 80 MG 24 hr tablet Take 1 tablet (80 mg total) by mouth daily. start taking every other day for the first 2 weeks then daily 30 tablet 3   gabapentin (NEURONTIN) 300 MG capsule Take 3 capsules (900 mg total) by mouth 2 (two) times daily. 180 capsule 2   lisinopril (ZESTRIL) 5 MG tablet Take 1 tablet (5 mg total) by mouth daily. 90 tablet 3   meloxicam (MOBIC) 7.5 MG tablet Take 1 tablet (7.5 mg total) by mouth daily. 30 tablet 0   metFORMIN (GLUCOPHAGE-XR) 500 MG 24 hr tablet Take 3 tablets (1,500 mg total) by mouth daily with breakfast AND 1 tablet (500 mg total) every evening. 4 tablets daily. 120 tablet 5   pantoprazole (PROTONIX) 40 MG tablet TAKE 1 TABLET (40 MG TOTAL) BY MOUTH TWICE A DAY BEFORE MEALS 180 tablet 0   Polyethylene Glycol 3350 (MIRALAX PO) Take by mouth as  needed.     trimethoprim-polymyxin b (POLYTRIM) ophthalmic solution SMARTSIG:In Eye(s)     No facility-administered medications prior to visit.    Allergies  Allergen Reactions   Cymbalta [Duloxetine  Hcl] Nausea And Vomiting   Darvocet [Propoxyphene N-Acetaminophen] Hives    ROS Review of Systems  Constitutional:  Negative for activity change, appetite change, chills, diaphoresis, fatigue and fever.  Respiratory: Negative.  Negative for chest tightness, shortness of breath and wheezing.   Cardiovascular: Negative.  Negative for chest pain and leg swelling.  Musculoskeletal: Negative.  Negative for arthralgias, back pain and gait problem.  Skin:  Positive for wound.  Neurological: Negative.  Negative for dizziness, facial asymmetry and headaches.  Psychiatric/Behavioral: Negative.  Negative for agitation, behavioral problems, confusion and decreased concentration.       Objective:    Physical Exam Constitutional:      General: He is not in acute distress.    Appearance: He is obese. He is not ill-appearing, toxic-appearing or diaphoretic.  Eyes:     General:        Right eye: No discharge.        Left eye: No discharge.     Extraocular Movements: Extraocular movements intact.     Conjunctiva/sclera: Conjunctivae normal.  Cardiovascular:     Rate and Rhythm: Normal rate and regular rhythm.     Heart sounds: No murmur heard.    No friction rub. No gallop.  Pulmonary:     Effort: No respiratory distress.     Breath sounds: No stridor. No wheezing, rhonchi or rales.  Chest:     Chest wall: No tenderness.  Abdominal:     General: There is no distension.     Palpations: Abdomen is soft. There is no mass.     Tenderness: There is no abdominal tenderness. There is no guarding.  Musculoskeletal:     Right lower leg: No edema.     Left lower leg: No edema.     Comments: Superficial partial thickness burn noted on right ankle, area surrounding the site appears red, the center  appears dark in color, no drainage ,no bleeding , no swelling noted. Has palpable pedal pulse, leg is warm and dry.wound measuring about  0.8 cm x 0.4 cm and 0.4 cm x 0.4cm   Skin:    General: Skin is warm and dry.     Capillary Refill: Capillary refill takes less than 2 seconds.     Coloration: Skin is not jaundiced or pale.     Findings: Erythema present.  Neurological:     Mental Status: He is alert and oriented to person, place, and time.     Cranial Nerves: No cranial nerve deficit.     Sensory: No sensory deficit.     Motor: No weakness.     Coordination: Coordination normal.     Gait: Gait normal.  Psychiatric:        Mood and Affect: Mood normal.        Behavior: Behavior normal.        Thought Content: Thought content normal.        Judgment: Judgment normal.     BP (!) 146/73   Pulse 78   Temp 98.1 F (36.7 C)   Ht _0  (1.778 m)   Wt 220 lb (99.8 kg)   SpO2 99%   BMI 31.57 kg/m  Wt Readings from Last 3 Encounters:  12/06/21 220 lb (99.8 kg)  11/01/21 217 lb 6 oz (98.6 kg)  08/02/21 219 lb 9.6 oz (99.6 kg)    Lab Results  Component Value Date   TSH 4.000 08/15/2020   Lab Results  Component Value Date   WBC  9.7 08/15/2020   HGB 14.9 08/15/2020   HCT 45.0 08/15/2020   MCV 93 08/15/2020   PLT 154 08/15/2020   Lab Results  Component Value Date   NA 142 05/02/2021   K 4.6 05/02/2021   CO2 20 05/06/2019   GLUCOSE 166 (H) 05/02/2021   BUN 11 05/02/2021   CREATININE 0.77 05/02/2021   BILITOT 1.0 05/02/2021   ALKPHOS 81 05/02/2021   AST 19 05/02/2021   ALT 49 (H) 05/06/2019   PROT 6.5 05/02/2021   ALBUMIN 4.3 05/02/2021   CALCIUM 9.2 05/02/2021   EGFR 98 05/02/2021   GFR 92.83 08/03/2018   Lab Results  Component Value Date   CHOL 189 08/02/2021   Lab Results  Component Value Date   HDL 39 (L) 08/02/2021   Lab Results  Component Value Date   LDLCALC 104 (H) 08/02/2021   Lab Results  Component Value Date   TRIG 272 (H) 08/02/2021    Lab Results  Component Value Date   CHOLHDL 4.8 08/02/2021   Lab Results  Component Value Date   HGBA1C 7.6 (H) 11/01/2021      Assessment & Plan:   Problem List Items Addressed This Visit       Cardiovascular and Mediastinum   High blood pressure    BP Readings from Last 3 Encounters:  12/06/21 (!) 146/73  11/01/21 123/70  08/02/21 130/68  Blood pressure is elevated in the office today but has always been well controlled.  currently on lisinopril 5 mg daily but he has not taken the medication today. No change made to his blood pressure medications today.         Other   Burn - Primary    Up-to-date with Tdap vaccine Hot Welding mental dropped on his right ankle 3 days ago.  Affected site appears red, central areas appears dark in color.  No swelling noted Rx bacitracin ointment applied twice daily for 1 week if no improvement of develops signs of infection we will order p.o. antibiotics.  Patient told to call the office if the site does not improve in one week.  Signs of infection reviewed with patient      Relevant Medications   bacitracin 500 UNIT/GM ointment    Meds ordered this encounter  Medications   bacitracin 500 UNIT/GM ointment    Sig: Apply 1 Application topically 2 (two) times daily.    Dispense:  15 g    Refill:  0    Follow-up: No follow-ups on file.    Renee Rival, FNP

## 2021-12-06 NOTE — Assessment & Plan Note (Addendum)
Up-to-date with Tdap vaccine Hot Welding mental dropped on his right ankle 3 days ago.  Affected site appears red, central areas appears dark in color.  No swelling noted Rx bacitracin ointment applied twice daily for 1 week if no improvement of develops signs of infection we will order p.o. antibiotics.  Patient told to call the office if the site does not improve in one week.  Signs of infection reviewed with patient

## 2021-12-06 NOTE — Patient Instructions (Addendum)
Please apply bacitracin ointment twice daily to affected burn sites for one week. If you develop fever, chills , severe pain, drainage from the burn site, please call this office. If your wound does not heal in one week please give [place] a call. Please keep site clean and dry open to air     It is important that you exercise regularly at least 30 minutes 5 times a week as tolerated  Think about what you will eat, plan ahead. Choose " clean, green, fresh or frozen" over canned, processed or packaged foods which are more sugary, salty and fatty. 70 to 75% of food eaten should be vegetables and fruit. Three meals at set times with snacks allowed between meals, but they must be fruit or vegetables. Aim to eat over a 12 hour period , example 7 am to 7 pm, and STOP after  your last meal of the day. Drink water,generally about 64 ounces per day, no other drink is as healthy. Fruit juice is best enjoyed in a healthy way, by EATING the fruit.  Thanks for choosing [facility] we consider it a privelige to serve you.

## 2021-12-16 ENCOUNTER — Encounter: Payer: Self-pay | Admitting: Nurse Practitioner

## 2021-12-16 ENCOUNTER — Ambulatory Visit (INDEPENDENT_AMBULATORY_CARE_PROVIDER_SITE_OTHER): Payer: Medicare HMO | Admitting: Nurse Practitioner

## 2021-12-16 VITALS — BP 129/70 | HR 59 | Temp 97.3°F | Ht 70.0 in | Wt 222.6 lb

## 2021-12-16 DIAGNOSIS — T24201D Burn of second degree of unspecified site of right lower limb, except ankle and foot, subsequent encounter: Secondary | ICD-10-CM

## 2021-12-16 DIAGNOSIS — I10 Essential (primary) hypertension: Secondary | ICD-10-CM

## 2021-12-16 MED ORDER — SULFAMETHOXAZOLE-TRIMETHOPRIM 800-160 MG PO TABS: 1.0000 | ORAL_TABLET | Freq: Two times a day (BID) | ORAL | 0 refills | Status: DC

## 2021-12-16 MED ORDER — SILVER SULFADIAZINE 1 % EX CREA: 1.0000 | TOPICAL_CREAM | Freq: Two times a day (BID) | CUTANEOUS | 0 refills | Status: DC

## 2021-12-16 NOTE — Patient Instructions (Signed)
Please apply silvadene cream twice daily to the affected site, take bactrim DS one tablet twice daily for 7 days .  Keep site clean and dry. Report fever, chills, increased pain , yellowish greenish drainage   It is important that you exercise regularly at least 30 minutes 5 times a week as tolerated  Think about what you will eat, plan ahead. Choose " clean, green, fresh or frozen" over canned, processed or packaged foods which are more sugary, salty and fatty. 70 to 75% of food eaten should be vegetables and fruit. Three meals at set times with snacks allowed between meals, but they must be fruit or vegetables. Aim to eat over a 12 hour period , example 7 am to 7 pm, and STOP after  your last meal of the day. Drink water,generally about 64 ounces per day, no other drink is as healthy. Fruit juice is best enjoyed in a healthy way, by EATING the fruit.  Thanks for choosing [facility] we consider it a privelige to serve you.

## 2021-12-16 NOTE — Assessment & Plan Note (Signed)
  BP Readings from Last 3 Encounters:  12/16/21 129/70  12/06/21 (!) 146/73  11/01/21 123/70  chronic condition well controlled  Continue lisinopril '5mg'$  daily  Dash diet advised , engage in regular moderate exercise at least 150 minutes weekly as tolerated
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
## 2021-12-16 NOTE — Assessment & Plan Note (Signed)
Wound tender to touch, surrounding skin appears red will treat with  PO antibiotics since no much improvement since last visit.  RX silvadene cream apply twice daily to affected site Bactrim DS , 1 tablet twice daily for one week. Signs of infection discussed with patient Take OTC tylenol as needed for pain .

## 2021-12-18 ENCOUNTER — Encounter (INDEPENDENT_AMBULATORY_CARE_PROVIDER_SITE_OTHER): Payer: Medicare HMO | Admitting: Ophthalmology

## 2021-12-25 ENCOUNTER — Encounter (INDEPENDENT_AMBULATORY_CARE_PROVIDER_SITE_OTHER): Payer: Medicare HMO | Admitting: Ophthalmology

## 2021-12-25 DIAGNOSIS — I10 Essential (primary) hypertension: Secondary | ICD-10-CM | POA: Diagnosis not present

## 2021-12-25 DIAGNOSIS — H43813 Vitreous degeneration, bilateral: Secondary | ICD-10-CM

## 2021-12-25 DIAGNOSIS — E113313 Type 2 diabetes mellitus with moderate nonproliferative diabetic retinopathy with macular edema, bilateral: Secondary | ICD-10-CM | POA: Diagnosis not present

## 2021-12-25 DIAGNOSIS — H35033 Hypertensive retinopathy, bilateral: Secondary | ICD-10-CM

## 2022-01-02 ENCOUNTER — Ambulatory Visit: Payer: Medicare HMO | Admitting: Podiatry

## 2022-01-02 DIAGNOSIS — L97522 Non-pressure chronic ulcer of other part of left foot with fat layer exposed: Secondary | ICD-10-CM | POA: Diagnosis not present

## 2022-01-02 DIAGNOSIS — E1142 Type 2 diabetes mellitus with diabetic polyneuropathy: Secondary | ICD-10-CM | POA: Diagnosis not present

## 2022-01-02 NOTE — Progress Notes (Signed)
Subjective:  Patient ID: [name], male    DOB: [birth year],  MRN: [id number]  Chief Complaint  Patient presents with   Wound Check    Open area to in between left hallux and 2nd toe. Patient has been applying bacitracin ointment. Patient is diabetic. Blood sugar is fluctuating.     71 y.o. male presents with concern for new wound present on the medial aspect of the left second toe.  Patient states that there has been pressure in between the first and second toe on the left foot that is caused the wound to open.  The he has been applying bacitracin daily.  Has been trying to cover the area with a Band-Aid.  Past Medical History:  Diagnosis Date   Allergy    seasonal   Arthritis    " all over me- back, hips, knees, hands, everywhere "   Cancer [facility])    Prostate cancer age 62; Lupron treatments followed by prostatectomy.   Cataract    left removed, forming right   Chronic constipation    x 23 yrs after prostate surgery   Diabetes mellitus without complication (HCC)    Diabetic retinal damage of both eyes ([place])    gets shots every month   GERD (gastroesophageal reflux disease)    Hyperlipidemia    Hypertension    Myocardial infarction [facility])    age 57; no stenting.   OSA (obstructive sleep apnea)    non-compliant with CPAP.   Sleep apnea    no cpap    Allergies  Allergen Reactions   Cymbalta [Duloxetine Hcl] Nausea And Vomiting   Darvocet [Propoxyphene N-Acetaminophen] Hives    ROS: Negative except as per HPI above  Objective:  General: AAO x3, NAD  Dermatological: With inspection of the left foot there is noted to be ulceration present on the medial aspect of the left second toe at the proximal interphalangeal joint approximately 1 cm x 0.6 cm x 0.2cm.  No exposed bone present.  Wound is only deep to subcutaneous fat tissue.  No erythema edema or drainage of the second toe.  There is mild breakdown of the lateral aspect of the corresponding area of the left  hallux however this is much more superficial in nature.  There is also a small opening at the first webspace that is only limited to breakdown of skin.  Vascular:  Dorsalis Pedis artery and Posterior Tibial artery pedal pulses are 0/4 bilateral.  Capillary fill time < 3 sec to all digits.   Neruologic: Grossly intact via light touch bilateral. Protective threshold intact to all sites bilateral.   Musculoskeletal: No gross boney pedal deformities bilateral. No pain, crepitus, or limitation noted with foot and ankle range of motion bilateral. Muscular strength 5/5 in all groups tested bilateral.  Gait: Unassisted, Nonantalgic.       Assessment:   1. Ulcer of toe of left foot, with fat layer exposed ([place])   2. Type 2 diabetes mellitus with diabetic polyneuropathy, without long-term current use of insulin ([place])      Plan:  Patient was evaluated and treated and all questions answered.  #Ulcer of the left second toe with fat layer exposed at the medial aspect of the proximal interphalangeal joint.  Related to hallux valgus deformity and pressure between the first and second toe.  No signs of infection. -We discussed the etiology and factors that are a part of the wound healing process.  We also discussed the risk of infection both  soft tissue and osteomyelitis from open ulceration.  Discussed the risk of limb loss if this happens or worsens. -Debridement as below. -Dressed with Betadine, DSD. -Continue home dressing changes daily with Betadine and dry sterile gauze dressing -Continue off-loading with gel padding and gauze in between the first and second toe to prevent friction.. -Vascular testing ordered.  Patient has diminished pedal pulses -HgbA1c: Last 7.6, 2 months ago -Last antibiotics: Not currently on antibiotics and not indicated at this time no signs of infection of the wound. -Imaging: Deferred imaging as wound is very superficial no sign of deep infection. -Recommend referral  to wound care center for ongoing weekly wound care to aid in healing.  Referral was placed  Procedure: Excisional Debridement of Wound Rationale: Removal of non-viable soft tissue from the wound to promote healing.  Anesthesia: none Post-Debridement Wound Measurements: 1 cm x 0.6 cm x 0.2 cm  Type of Debridement: Sharp Excisional with 10 blade scalpel Tissue Removed: Non-viable soft tissue Depth of Debridement: subcutaneous tissue. Technique: Sharp excisional debridement to bleeding, viable wound base.  Dressing: Dry, sterile, compression dressing. Disposition: Patient tolerated procedure well.       Return in about 3 weeks (around 01/23/2022) for follow up left 2nd toe ulcer.          Everitt Amber, DPM [facility]

## 2022-01-08 ENCOUNTER — Ambulatory Visit (HOSPITAL_COMMUNITY)
Admission: RE | Admit: 2022-01-08 | Discharge: 2022-01-08 | Disposition: A | Discharge status: Home / Self Care | Payer: Medicare HMO | Source: Ambulatory Visit | Attending: General Surgery | Admitting: General Surgery

## 2022-01-08 ENCOUNTER — Encounter (HOSPITAL_BASED_OUTPATIENT_CLINIC_OR_DEPARTMENT_OTHER): Payer: Medicare HMO | Attending: General Surgery | Admitting: General Surgery

## 2022-01-08 ENCOUNTER — Other Ambulatory Visit (HOSPITAL_COMMUNITY): Payer: Self-pay | Admitting: General Surgery

## 2022-01-08 DIAGNOSIS — L97312 Non-pressure chronic ulcer of right ankle with fat layer exposed: Secondary | ICD-10-CM | POA: Diagnosis not present

## 2022-01-08 DIAGNOSIS — L97526 Non-pressure chronic ulcer of other part of left foot with bone involvement without evidence of necrosis: Secondary | ICD-10-CM | POA: Insufficient documentation

## 2022-01-08 DIAGNOSIS — E11621 Type 2 diabetes mellitus with foot ulcer: Secondary | ICD-10-CM | POA: Diagnosis not present

## 2022-01-08 DIAGNOSIS — M869 Osteomyelitis, unspecified: Secondary | ICD-10-CM | POA: Diagnosis not present

## 2022-01-08 DIAGNOSIS — E1169 Type 2 diabetes mellitus with other specified complication: Secondary | ICD-10-CM | POA: Diagnosis not present

## 2022-01-08 DIAGNOSIS — L97812 Non-pressure chronic ulcer of other part of right lower leg with fat layer exposed: Secondary | ICD-10-CM | POA: Insufficient documentation

## 2022-01-08 DIAGNOSIS — L97509 Non-pressure chronic ulcer of other part of unspecified foot with unspecified severity: Secondary | ICD-10-CM | POA: Diagnosis not present

## 2022-01-08 DIAGNOSIS — E11622 Type 2 diabetes mellitus with other skin ulcer: Secondary | ICD-10-CM | POA: Diagnosis not present

## 2022-01-08 DIAGNOSIS — L97529 Non-pressure chronic ulcer of other part of left foot with unspecified severity: Secondary | ICD-10-CM | POA: Diagnosis not present

## 2022-01-08 DIAGNOSIS — L97522 Non-pressure chronic ulcer of other part of left foot with fat layer exposed: Secondary | ICD-10-CM | POA: Diagnosis not present

## 2022-01-08 NOTE — Progress Notes (Signed)
[name] Frost, [name] Frost ([id number]) [id number] Page 1 of 9 Visit Report for 01/08/2022 Allergy List Details Patient Name: Date of Service: [name] Frost Comment ID [name] 01/08/2022 1:30 PM Medical Record Number: [id number] Patient Account Number: [id number] Date of Birth/Sex: Treating RN: 1951-01-24 (71 y.o. Janyth Contes Primary Care Ciella Obi: Angelica Chessman Other Clinician: Referring Najmah Carradine: Treating Javon Snee/Extender: Irene Limbo in Treatment: 0 Allergies Active Allergies Cymbalta Darvocet-N Allergy Notes Electronic Signature(s) Signed: 01/08/2022 4:30:30 PM By: Sabas Sous By: Adline Peals on 01/08/2022 13:37:55 -------------------------------------------------------------------------------- Arrival Information Details Patient Name: Date of Service: [name] [name] Frost, [name] Frost ID [name] 01/08/2022 1:30 PM Medical Record Number: [id number] Patient Account Number: [id number] Date of Birth/Sex: Treating RN: 1951/01/07 (71 y.o. Janyth Contes Primary Care Ashur Glatfelter: Angelica Chessman Other Clinician: Referring Harm Jou: Treating Anadia Helmes/Extender: Irene Limbo in Treatment: 0 Visit Information Patient Arrived: Ambulatory Arrival Time: 13:33 Accompanied By: self Transfer Assistance: None Patient Identification Verified: Yes Secondary Verification Process Completed: Yes Patient Requires Transmission-Based Precautions: No Patient Has Alerts: Yes Patient Alerts: ABIs in clinic [place] Electronic Signature(s) Signed: 01/08/2022 4:30:30 PM By: Adline Peals Entered By: Adline Peals on 01/08/2022 14:08:44 [place], [place] ([id number]) [id number] Page 2 of 9 -------------------------------------------------------------------------------- Encounter Discharge Information Details Patient Name: Date of Service: [name] Frost Comment ID [name] 01/08/2022 1:30 PM Medical  Record Number: [id number] Patient Account Number: [id number] Date of Birth/Sex: Treating RN: 1950-09-21 (71 y.o. Janyth Contes Primary Care Maksym Pfiffner: Angelica Chessman Other Clinician: Referring Emmaus Brandi: Treating Zen Cedillos/Extender: Irene Limbo in Treatment: 0 Encounter Discharge Information Items Post Procedure Vitals Discharge Condition: Stable Temperature (F): 98.3 Ambulatory Status: Ambulatory Pulse (bpm): 94 Discharge Destination: Home Respiratory Rate (breaths/min): 20 Transportation: Private Auto Blood Pressure (mmHg): 139/84 Accompanied By: self Schedule Follow-up Appointment: Yes Clinical Summary of Care: Patient Declined Electronic Signature(s) Signed: 01/08/2022 4:30:30 PM By: Adline Peals Entered By: Adline Peals on 01/08/2022 14:35:57 -------------------------------------------------------------------------------- Lower Extremity Assessment Details Patient Name: Date of Service: [name] Frost Comment ID [name] 01/08/2022 1:30 PM Medical Record Number: [id number] Patient Account Number: [id number] Date of Birth/Sex: Treating RN: 1951-02-16 (71 y.o. Janyth Contes Primary Care Tanieka Pownall: Angelica Chessman Other Clinician: Referring Tylasia Fletchall: Treating Caliber Landess/Extender: Irene Limbo in Treatment: 0 Edema Assessment Assessed: Shirlyn Goltz: No] [Right: No] [Left: Edema] [Right: :] Calf Left: Right: Point of Measurement: From Medial Instep 35.7 cm 33.6 cm Ankle Left: Right: Point of Measurement: From Medial Instep 22.1 cm 21.5 cm Vascular Assessment Pulses: Dorsalis Pedis Palpable: [Left:Yes] [Right:Yes] Notes bilateral ABIs noncompressible Electronic Signature(s) Signed: 01/08/2022 4:30:30 PM By: Adline Peals Entered By: Adline Peals on 01/08/2022 14:08:28 [place], [name] Frost ([id number]) [id number] Page 3 of  9 -------------------------------------------------------------------------------- Multi Wound Chart Details Patient Name: Date of Service: [name] Frost Comment ID [name] 01/08/2022 1:30 PM Medical Record Number: [id number] Patient Account Number: [id number] Date of Birth/Sex: Treating RN: 27-Jan-1951 (71 y.o. M) Primary Care Gable Odonohue: Angelica Chessman Other Clinician: Referring Saiya Crist: Treating Pa Tennant/Extender: Irene Limbo in Treatment: 0 Vital Signs Height(in): 70 Capillary Blood Glucose(mg/dl): 114 Weight(lbs): 222 Pulse(bpm): 65 Body Mass Index(BMI): 31.9 Blood Pressure(mmHg): 139/84 Temperature(F): 98.3 Respiratory Rate(breaths/min): 20 [1:Photos:] [N/A:N/A] Left, Medial T Second oe Right, Medial Ankle N/A Wound Location: Blister Thermal Burn N/A Wounding Event: Diabetic Wound/Ulcer of the Lower 2nd degree Burn N/A Primary Etiology: Extremity Cataracts, Sleep Apnea, Hypertension, Cataracts, Sleep Apnea, Hypertension, N/A Comorbid History: Myocardial Infarction, Type II Diabetes Myocardial Infarction, Type II Diabetes 12/17/2021 11/20/2021  N/A Date Acquired: 0 0 N/A Weeks of Treatment: Open Open N/A Wound Status: No No N/A Wound Recurrence: 0.5x0.5x0.1 0.5x0.4x0.1 N/A Measurements L x W x D (cm) 0.196 0.157 N/A A (cm) : rea 0.02 0.016 N/A Volume (cm) : Grade 2 Full Thickness Without Exposed N/A Classification: Support Structures Medium Medium N/A Exudate A mount: Serosanguineous Serosanguineous N/A Exudate Type: red, brown red, brown N/A Exudate Color: Distinct, outline attached Distinct, outline attached N/A Wound Margin: Small (1-33%) Small (1-33%) N/A Granulation A mount: Red, Pink Red, Pink N/A Granulation Quality: Large (67-100%) Large (67-100%) N/A Necrotic A mount: Adherent Slough Eschar, Adherent Slough N/A Necrotic Tissue: Fat Layer (Subcutaneous Tissue): Yes Fat Layer (Subcutaneous Tissue): Yes N/A Exposed  Structures: Tendon: Yes Fascia: No Fascia: No Tendon: No Muscle: No Muscle: No Joint: No Joint: No Bone: No Bone: No None None N/A Epithelialization: Debridement - Excisional Debridement - Excisional N/A Debridement: Pre-procedure Verification/Time Out 14:11 14:11 N/A Taken: Lidocaine 4% Topical Solution Lidocaine 4% Topical Solution N/A Pain Control: Subcutaneous, Slough Subcutaneous, Slough N/A Tissue Debrided: Skin/Subcutaneous Tissue Skin/Subcutaneous Tissue N/A Level: 0.25 0.2 N/A Debridement A (sq cm): rea Curette Curette N/A Instrument: Minimum Minimum N/A Bleeding: Pressure Pressure N/A Hemostasis A chieved: Procedure was tolerated well Procedure was tolerated well N/A Debridement Treatment Response: 0.5x0.5x0.1 0.5x0.4x0.1 N/A Post Debridement Measurements L x W x D (cm) 0.02 0.016 N/A Post Debridement Volume: (cm) No Abnormalities Noted Scarring: Yes N/A Periwound Skin Texture: Dry/Scaly: Yes Dry/Scaly: Yes N/A Periwound Skin Moisture: No Abnormalities Noted No Abnormalities Noted N/A Periwound Skin Color: [place], Rhone Frost ([id number]) [id number] Page 4 of 9 No Abnormality No Abnormality N/A Temperature: Debridement Debridement N/A Procedures Performed: Treatment Notes Electronic Signature(s) Signed: 01/08/2022 2:33:07 PM By: Fredirick Maudlin MD FACS Entered By: Fredirick Maudlin on 01/08/2022 14:33:06 -------------------------------------------------------------------------------- Multi-Disciplinary Care Plan Details Patient Name: Date of Service: [name] [name] Frost, [name] Frost ID [name] 01/08/2022 1:30 PM Medical Record Number: [id number] Patient Account Number: [id number] Date of Birth/Sex: Treating RN: 1950-11-27 (71 y.o. Janyth Contes Primary Care Jenel Gierke: Angelica Chessman Other Clinician: Referring Refugia Laneve: Treating Drayk Humbarger/Extender: Irene Limbo in Treatment: 0 Active Inactive Necrotic  Tissue Nursing Diagnoses: Impaired tissue integrity related to necrotic/devitalized tissue Knowledge deficit related to management of necrotic/devitalized tissue Goals: Necrotic/devitalized tissue will be minimized in the wound bed Date Initiated: 01/08/2022 Target Resolution Date: 03/07/2022 Goal Status: Active Patient/caregiver will verbalize understanding of reason and process for debridement of necrotic tissue Date Initiated: 01/08/2022 Target Resolution Date: 03/07/2022 Goal Status: Active Interventions: Assess patient pain level pre-, during and post procedure and prior to discharge Provide education on necrotic tissue and debridement process Treatment Activities: Apply topical anesthetic as ordered : 01/08/2022 Notes: Wound/Skin Impairment Nursing Diagnoses: Impaired tissue integrity Knowledge deficit related to ulceration/compromised skin integrity Goals: Patient/caregiver will verbalize understanding of skin care regimen Date Initiated: 01/08/2022 Target Resolution Date: 03/07/2022 Goal Status: Active Interventions: Assess ulceration(s) every visit Treatment Activities: Skin care regimen initiated : 01/08/2022 Topical wound management initiated : 01/08/2022 Notes: Electronic Signature(s) Signed: 01/08/2022 4:30:30 PM By: Allen Derry, Paden Frost ([id number]) PM By: Pershing Cox.pdf Page 5 of 9 Signed: 01/08/2022 4:30:30 aylor Entered By: Adline Peals on 01/08/2022 14:13:34 -------------------------------------------------------------------------------- Pain Assessment Details Patient Name: Date of Service: [name] Frost Comment ID [name] 01/08/2022 1:30 PM Medical Record Number: [id number] Patient Account Number: [id number] Date of Birth/Sex: Treating RN: 1950/12/04 (71 y.o. Janyth Contes Primary Care Dariel Pellecchia: Angelica Chessman Other Clinician: Referring Gavriela Cashin: Treating Calin Fantroy/Extender: Lake Bells,  Marlena Clipper in Treatment: 0 Active Problems Location of Pain Severity and Description of Pain Patient Has Paino No Site Locations Rate the pain. Current Pain Level: 0 Pain Management and Medication Current Pain Management: Electronic Signature(s) Signed: 01/08/2022 4:30:30 PM By: Adline Peals Entered By: Adline Peals on 01/08/2022 13:58:35 -------------------------------------------------------------------------------- Patient/Caregiver Education Details Patient Name: Date of Service: [name] Frost Comment ID [name] 11/8/2023andnbsp1:30 PM Medical Record Number: [id number] Patient Account Number: [id number] Date of Birth/Gender: Treating RN: 09/23/1950 (71 y.o. Janyth Contes Primary Care Physician: Angelica Chessman Other Clinician: Referring Physician: Treating Physician/Extender: Irene Limbo in Treatment: 0 Education Assessment Education Provided To: Patient Education Topics Provided [name] Frost, [name] Frost ([id number]) [id number] Page 6 of 9 Wound/Skin Impairment: Methods: Explain/Verbal Responses: Reinforcements needed, State content correctly Electronic Signature(s) Signed: 01/08/2022 4:30:30 PM By: Adline Peals Entered By: Adline Peals on 01/08/2022 14:12:16 -------------------------------------------------------------------------------- Wound Assessment Details Patient Name: Date of Service: [name] [name] Frost ID [name] 01/08/2022 1:30 PM Medical Record Number: [id number] Patient Account Number: [id number] Date of Birth/Sex: Treating RN: 1950/12/24 (71 y.o. Janyth Contes Primary Care Rizwan Kuyper: Angelica Chessman Other Clinician: Referring Anam Bobby: Treating Tani Virgo/Extender: Irene Limbo in Treatment: 0 Wound Status Wound Number: 1 Primary Diabetic Wound/Ulcer of the Lower Extremity Etiology: Wound Location: Left, Medial T Second oe Wound Open Wounding  Event: Blister Status: Date Acquired: 12/17/2021 Comorbid Cataracts, Sleep Apnea, Hypertension, Myocardial Infarction, Weeks Of Treatment: 0 History: Type II Diabetes Clustered Wound: No Photos Wound Measurements Length: (cm) 0.5 Width: (cm) 0.5 Depth: (cm) 0.1 Area: (cm) 0.196 Volume: (cm) 0.02 % Reduction in Area: % Reduction in Volume: Epithelialization: None Tunneling: No Undermining: No Wound Description Classification: Grade 2 Wound Margin: Distinct, outline attached Exudate Amount: Medium Exudate Type: Serosanguineous Exudate Color: red, brown Foul Odor After Cleansing: No Slough/Fibrino Yes Wound Bed Granulation Amount: Small (1-33%) Exposed Structure Granulation Quality: Red, Pink Fascia Exposed: No Necrotic Amount: Large (67-100%) Fat Layer (Subcutaneous Tissue) Exposed: Yes Necrotic Quality: Adherent Slough Tendon Exposed: Yes Muscle Exposed: No Joint Exposed: No Bone Exposed: No Periwound Skin Texture Texture Color [name] Frost, [name] Frost ([id number]) [id number] Page 7 of 9 No Abnormalities Noted: Yes No Abnormalities Noted: Yes Moisture Temperature / Pain No Abnormalities Noted: No Temperature: No Abnormality Dry / Scaly: Yes Treatment Notes Wound #1 (Toe Second) Wound Laterality: Left, Medial Cleanser Soap and Water Discharge Instruction: May shower and wash wound with dial antibacterial soap and water prior to dressing change. Wound Cleanser Discharge Instruction: Cleanse the wound with wound cleanser prior to applying a clean dressing using gauze sponges, not tissue or cotton balls. Peri-Wound Care Topical Mupirocin Ointment Discharge Instruction: Apply Mupirocin (Bactroban) as instructed Primary Dressing KerraCel Ag Gelling Fiber Dressing, 2x2 in (silver alginate) Discharge Instruction: Apply silver alginate to wound bed as instructed Secondary Dressing Woven Gauze Sponges 2x2 in Discharge Instruction: Apply over  primary dressing as directed. Secured With Conforming Stretch Gauze Bandage, Sterile 2x75 (in/in) Discharge Instruction: Secure with stretch gauze as directed. Transpore Surgical Tape, 2x10 (in/yd) Discharge Instruction: Secure dressing with tape as directed. Compression Wrap Compression Stockings Add-Ons Electronic Signature(s) Signed: 01/08/2022 4:30:30 PM By: Adline Peals Entered By: Adline Peals on 01/08/2022 14:15:49 -------------------------------------------------------------------------------- Wound Assessment Details Patient Name: Date of Service: [name] [name] Frost, [name] Frost ID [name] 01/08/2022 1:30 PM Medical Record Number: [id number] Patient Account Number: [id number] Date of Birth/Sex: Treating RN: 1950-04-10 (71 y.o. Janyth Contes Primary Care Eashan Schipani: Angelica Chessman Other Clinician: Referring Dotti Busey: Treating Oz Gammel/Extender: Lake Bells,  Alexander Weeks in Treatment: 0 Wound Status Wound Number: 2 Primary 2nd degree Burn Etiology: Wound Location: Right, Medial Ankle Wound Open Wounding Event: Thermal Burn Status: Date Acquired: 11/20/2021 Comorbid Cataracts, Sleep Apnea, Hypertension, Myocardial Infarction, Weeks Of Treatment: 0 History: Type II Diabetes Clustered Wound: No Photos [name] Frost, [name] Frost ([id number]) [id number] Page 8 of 9 Wound Measurements Length: (cm) 0.5 Width: (cm) 0.4 Depth: (cm) 0.1 Area: (cm) 0.157 Volume: (cm) 0.016 % Reduction in Area: % Reduction in Volume: Epithelialization: None Tunneling: No Undermining: No Wound Description Classification: Full Thickness Without Exposed Support Structures Wound Margin: Distinct, outline attached Exudate Amount: Medium Exudate Type: Serosanguineous Exudate Color: red, brown Foul Odor After Cleansing: No Slough/Fibrino Yes Wound Bed Granulation Amount: Small (1-33%) Exposed Structure Granulation Quality: Red, Pink Fascia Exposed:  No Necrotic Amount: Large (67-100%) Fat Layer (Subcutaneous Tissue) Exposed: Yes Necrotic Quality: Eschar, Adherent Slough Tendon Exposed: No Muscle Exposed: No Joint Exposed: No Bone Exposed: No Periwound Skin Texture Texture Color No Abnormalities Noted: No No Abnormalities Noted: Yes Scarring: Yes Temperature / Pain Temperature: No Abnormality Moisture No Abnormalities Noted: No Dry / Scaly: Yes Treatment Notes Wound #2 (Ankle) Wound Laterality: Right, Medial Cleanser Soap and Water Discharge Instruction: May shower and wash wound with dial antibacterial soap and water prior to dressing change. Wound Cleanser Discharge Instruction: Cleanse the wound with wound cleanser prior to applying a clean dressing using gauze sponges, not tissue or cotton balls. Peri-Wound Care Skin Prep Discharge Instruction: Use skin prep as directed Topical Mupirocin Ointment Discharge Instruction: Apply Mupirocin (Bactroban) as instructed Primary Dressing KerraCel Ag Gelling Fiber Dressing, 2x2 in (silver alginate) Discharge Instruction: Apply silver alginate to wound bed as instructed Secondary Dressing ALLEVYN Gentle Border, 3x3 (in/in) Discharge Instruction: Apply over primary dressing as directed. Secured With [employer], [name] Frost ([id number]) [id number] Page 9 of 9 Compression Stockings Add-Ons Electronic Signature(s) Signed: 01/08/2022 4:30:30 PM By: Adline Peals Entered By: Adline Peals on 01/08/2022 14:15:20 -------------------------------------------------------------------------------- Vitals Details Patient Name: Date of Service: [name] [name] Frost, [name] Frost ID [name] 01/08/2022 1:30 PM Medical Record Number: [id number] Patient Account Number: [id number] Date of Birth/Sex: Treating RN: [birth year] (71 y.o. Janyth Contes Primary Care Samai Corea: Angelica Chessman Other Clinician: Referring Joette Schmoker: Treating Dwight Burdo/Extender: Irene Limbo in Treatment: 0 Vital Signs Time Taken: 15:35 Temperature (F): 98.3 Height (in): 70 Pulse (bpm): 94 Source: Stated Respiratory Rate (breaths/min): 20 Weight (lbs): 222 Blood Pressure (mmHg): 139/84 Source: Stated Capillary Blood Glucose (mg/dl): 114 Body Mass Index (BMI): 31.9 Reference Range: 80 - 120 mg / dl Electronic Signature(s) Signed: 01/08/2022 4:30:30 PM By: Adline Peals Entered By: Adline Peals on 01/08/2022 13:37:05
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
## 2022-01-08 NOTE — Progress Notes (Signed)
[name], [name] ([id number]) [phone].pdf Page 1 of 4 Visit Report for 01/08/2022 Abuse Risk Screen Details Patient Name: Date of Service: [name] Comment ID [name] 01/08/2022 1:30 PM Medical Record Number: [id number] Patient Account Number: [id number] Date of Birth/Sex: Treating RN: January 03, 1951 (71 y.o. [name] [name]: [name] Other Clinician: Referring [name]: Treating [name]/Extender: [name] in Treatment: 0 Abuse Risk Screen Items Answer ABUSE RISK SCREEN: Has anyone close to you tried to hurt or harm you recentlyo No Do you feel uncomfortable with anyone in your familyo No Has anyone forced you do things that you didnt want to doo No Electronic Signature(s) Signed: 01/08/2022 4:30:30 PM By: Adline Peals Entered By: Adline Peals on 01/08/2022 13:48:02 -------------------------------------------------------------------------------- Activities of Daily Living Details Patient Name: Date of Service: [name] Comment ID [name] 01/08/2022 1:30 PM Medical Record Number: [id number] Patient Account Number: [id number] Date of Birth/Sex: Treating RN: 1951-02-18 (71 y.o. [name] Clayton Care Nikodem Leadbetter: [name] Other Clinician: Referring [name]: Treating [name]/Extender: [name] in Treatment: 0 Activities of Daily Living Items Answer Activities of Daily Living (Please select one for each item) Drive Automobile Completely Able T Medications ake Completely Able Use T elephone Completely Able Care for Appearance Completely Able Use T oilet Completely Able Bath / Shower Completely Able Dress Self Completely Able Feed Self Completely Able Walk Completely Able Get In / Out Bed Completely Able Housework Completely Able Prepare Meals Completely Able Handle Money Completely Able Shop for Self Completely  Able Electronic Signature(s) Signed: 01/08/2022 4:30:30 PM By: Adline Peals Entered By: Adline Peals on 01/08/2022 13:48:17 [place], [place] ([id number]) [id number] Page 2 of 4 -------------------------------------------------------------------------------- Education Screening Details Patient Name: Date of Service: [name] Comment ID [name] 01/08/2022 1:30 PM Medical Record Number: [id number] Patient Account Number: [id number] Date of Birth/Sex: Treating RN: Jul 08, 1950 (71 y.o. [name] Clayton Care [name]: [name] Other Clinician: Referring [name]: Treating [name]/Extender: [name] in Treatment: 0 Clayton Learner Assessed: Patient Learning Preferences/Education Level/Clayton Language Learning Preference: Explanation, Demonstration, Video, Printed Material Highest Education Level: High School Preferred Language: English Cognitive Barrier Language Barrier: No Translator Needed: No Memory Deficit: No Emotional Barrier: No Cultural/Religious Beliefs Affecting Medical Care: No Physical Barrier Impaired Vision: No Impaired Hearing: No Decreased Hand dexterity: No Knowledge/Comprehension Knowledge Level: Medium Comprehension Level: Medium Ability to understand written instructions: Medium Ability to understand verbal instructions: Medium Motivation Anxiety Level: Calm Cooperation: Cooperative Education Importance: Acknowledges Need Interest in Health Problems: Asks Questions Perception: Coherent Willingness to Engage in Self-Management Medium Activities: Readiness to Engage in Self-Management Medium Activities: Electronic Signature(s) Signed: 01/08/2022 4:30:30 PM By: Adline Peals Entered By: Adline Peals on 01/08/2022 13:48:59 -------------------------------------------------------------------------------- Fall Risk Assessment Details Patient Name: Date of  Service: [name], [name] 01/08/2022 1:30 PM Medical Record Number: [id number] Patient Account Number: [id number] Date of Birth/Sex: Treating RN: 1950-09-25 (71 y.o. [name] Clayton Care [name]: [name] Other Clinician: Referring [name]: Treating [name]/Extender: [name] in Treatment: 0 Fall Risk Assessment Items Have you had 2 or more falls in the last 12 monthso 0 No [name], [name] ([id number]) [id number] Page 3 of 4 Have you had any fall that resulted in injury in the last 12 monthso 0 No FALLS RISK SCREEN History of falling - immediate or within 3 months 0 No Secondary diagnosis (Do you have 2 or more  medical diagnoseso) 0 No Ambulatory aid None/bed rest/wheelchair/nurse 0 Yes Crutches/cane/walker 0 No Furniture 0 No Intravenous therapy Access/Saline/Heparin Lock 0 No Gait/Transferring Normal/ bed rest/ wheelchair 0 Yes Weak (short steps with or without shuffle, stooped but able to lift head while walking, may seek 0 No support from furniture) Impaired (short steps with shuffle, may have difficulty arising from chair, head down, impaired 0 No balance) Mental Status Oriented to own ability 0 Yes Electronic Signature(s) Signed: 01/08/2022 4:30:30 PM By: Adline Peals Entered By: Adline Peals on 01/08/2022 13:49:20 -------------------------------------------------------------------------------- Foot Assessment Details Patient Name: Date of Service: [name], [name] 01/08/2022 1:30 PM Medical Record Number: [id number] Patient Account Number: [id number] Date of Birth/Sex: Treating RN: 1950-08-20 (71 y.o. [name] Clayton Care [name]: [name] Other Clinician: Referring [name]: Treating [name]/Extender: [name] in Treatment: 0 Foot Assessment Items Site Locations + = Sensation present, - =  Sensation absent, C = Callus, U = Ulcer Frost = Redness, W = Warmth, M = Maceration, PU = Pre-ulcerative lesion F = Fissure, S = Swelling, D = Dryness Assessment Right: Left: Other Deformity: No No Prior Foot Ulcer: No No Prior Amputation: No No Charcot Joint: No No Ambulatory Status: Ambulatory Without Help GaitAbram Frost, [name] ([id number]) [id number].pdf Page 4 of 4 Electronic Signature(s) Signed: 01/08/2022 4:30:30 PM By: Adline Peals Entered By: Adline Peals on 01/08/2022 13:52:13 -------------------------------------------------------------------------------- Nutrition Risk Screening Details Patient Name: Date of Service: [name] ID [name] 01/08/2022 1:30 PM Medical Record Number: [id number] Patient Account Number: [id number] Date of Birth/Sex: Treating RN: 1950-06-29 (71 y.o. [name] Clayton Care Massie Mees: [name] Other Clinician: Referring Tameyah Koch: Treating Flois Mctague/Extender: [name] in Treatment: 0 Height (in): 70 Weight (lbs): 222 Body Mass Index (BMI): 31.9 Nutrition Risk Screening Items Score Screening NUTRITION RISK SCREEN: I have an illness or condition that made me change the kind and/or amount of food I eat 0 No I eat fewer than two meals per day 0 No I eat few fruits and vegetables, or milk products 0 No I have three or more drinks of beer, liquor or wine almost every day 0 No I have tooth or mouth problems that make it hard for me to eat 0 No I don't always have enough money to buy the food I need 0 No I eat alone most of the time 0 No I take three or more different prescribed or over-the-counter drugs a day 1 Yes Without wanting to, I have lost or gained 10 pounds in the last six months 0 No I am not always physically able to shop, cook and/or feed myself 0 No Nutrition Protocols Good Risk Protocol 0 No interventions needed Moderate Risk  Protocol High Risk Proctocol Risk Level: Good Risk Score: 1 Electronic Signature(s) Signed: 01/08/2022 4:30:30 PM By: Adline Peals Entered By: Adline Peals on 01/08/2022 13:49:49

## 2022-01-09 DIAGNOSIS — S91102A Unspecified open wound of left great toe without damage to nail, initial encounter: Secondary | ICD-10-CM | POA: Diagnosis not present

## 2022-01-13 ENCOUNTER — Other Ambulatory Visit: Payer: Self-pay | Admitting: *Deleted

## 2022-01-13 DIAGNOSIS — M79673 Pain in unspecified foot: Secondary | ICD-10-CM

## 2022-01-14 ENCOUNTER — Encounter: Payer: Self-pay | Admitting: Vascular Surgery

## 2022-01-14 ENCOUNTER — Ambulatory Visit: Payer: Medicare HMO | Admitting: Vascular Surgery

## 2022-01-14 ENCOUNTER — Other Ambulatory Visit: Payer: Self-pay

## 2022-01-14 ENCOUNTER — Ambulatory Visit (HOSPITAL_COMMUNITY)
Admission: RE | Admit: 2022-01-14 | Discharge: 2022-01-14 | Disposition: A | Discharge status: Home / Self Care | Payer: Medicare HMO | Source: Ambulatory Visit | Attending: Vascular Surgery | Admitting: Vascular Surgery

## 2022-01-14 VITALS — BP 140/81 | HR 82 | Temp 97.8°F | Resp 16 | Ht 71.0 in | Wt 218.0 lb

## 2022-01-14 DIAGNOSIS — M79673 Pain in unspecified foot: Secondary | ICD-10-CM | POA: Diagnosis not present

## 2022-01-14 DIAGNOSIS — I70222 Atherosclerosis of native arteries of extremities with rest pain, left leg: Secondary | ICD-10-CM

## 2022-01-14 NOTE — Progress Notes (Signed)
Patient name: [name] MRN: [id number] DOB: 02-05-1951 Sex: male  REASON FOR CONSULT: Left second toe ulcer  HPI: [name] is a 71 y.o. male, with history of hypertension, hyperlipidemia, coronary artery disease, diabetes, prostate cancer that presents for evaluation of a left second toe ulcer.  He states this has been present for about 4 weeks.  States this was related to a dressing to offload pressure between his toes that rubbed a blister.  He states this has gotten much worse over the last 4 weeks.  He is followed by podiatry who has been debriding this and then advised him to put Betadine on the wound.  No previous lower extremity revascularizations.  Past Medical History:  Diagnosis Date   Allergy    seasonal   Arthritis    " all over me- back, hips, knees, hands, everywhere "   Cancer [facility])    Prostate cancer age 56; Lupron treatments followed by prostatectomy.   Cataract    left removed, forming right   Chronic constipation    x 23 yrs after prostate surgery   Diabetes mellitus without complication (HCC)    Diabetic retinal damage of both eyes ([place])    gets shots every month   GERD (gastroesophageal reflux disease)    Hyperlipidemia    Hypertension    Myocardial infarction [facility])    age 76; no stenting.   OSA (obstructive sleep apnea)    non-compliant with CPAP.   Sleep apnea    no cpap    Past Surgical History:  Procedure Laterality Date   APPENDECTOMY     CHOLECYSTECTOMY     PROSTATE SURGERY      Family History  Problem Relation Age of Onset   Cancer Mother        leukemia   Diabetes Mother    Cancer Father 75       lung cancer with mets   Colon cancer Maternal Grandmother    Heart disease Maternal Grandmother    Heart disease Maternal Grandfather    Heart disease Paternal Grandmother    Cancer Paternal Grandfather        lung, prostate   Esophageal cancer Neg Hx    Colon polyps Neg Hx    Stomach cancer Neg Hx    Rectal cancer  Neg Hx     SOCIAL HISTORY: Social History   Socioeconomic History   Marital status: Single    Spouse name: GF-Betty   Number of children: 2   Years of education: 11   Highest education level: Not on file  Occupational History   Occupation: Buyer, retail: [employer]   Tobacco Use   Smoking status: Never   Smokeless tobacco: Current    Types: Chew  Vaping Use   Vaping Use: Never used  Substance and Sexual Activity   Alcohol use: Yes    Comment: social   Drug use: Not Currently   Sexual activity: Not Currently  Other Topics Concern   Not on file  Social History Narrative   Marital status: divorced, girlfriend x 1977     Children: 1      Tobacco: none; chew tobacco      Alcohol: beers on weekends      Exercise: none   Social Determinants of Health   Financial Resource Strain: Low Risk  (11/25/2020)   Overall Financial Resource Strain (CARDIA)    Difficulty of Paying Living Expenses: Not hard at all  Food Insecurity: Food Insecurity Present (11/25/2020)   Hunger Vital Sign    Worried About Running Out of Food in the Last Year: Sometimes true    Ran Out of Food in the Last Year: Sometimes true  Transportation Needs: No Transportation Needs (11/25/2020)   PRAPARE - Transportation    Lack of Transportation (Medical): No    Lack of Transportation (Non-Medical): No  Physical Activity: Insufficiently Active (11/25/2020)   Exercise Vital Sign    Days of Exercise per Week: 7 days    Minutes of Exercise per Session: 10 min  Stress: Stress Concern Present (11/25/2020)   [employer] - Occupational Stress Questionnaire    Feeling of Stress : To some extent  Social Connections: Moderately Isolated (11/25/2020)   Social Connection and Isolation Panel [NHANES]    Frequency of Communication with Friends and Family: More than three times a week    Frequency of Social Gatherings with Friends and Family: More than three times a week     Attends Religious Services: More than 4 times per year    Active Member of Clubs or Organizations: No    Attends Club or Organization Meetings: Never    Marital Status: Divorced  Intimate Partner Violence: Not At Risk (11/25/2020)   Humiliation, Afraid, Rape, and Kick questionnaire    Fear of Current or Ex-Partner: No    Emotionally Abused: No    Physically Abused: No    Sexually Abused: No    Allergies  Allergen Reactions   Cymbalta [Duloxetine Hcl] Nausea And Vomiting   Darvocet [Propoxyphene N-Acetaminophen] Hives    Current Outpatient Medications  Medication Sig Dispense Refill   aspirin 81 MG tablet Take 81 mg by mouth daily.     azelastine (ASTELIN) 0.1 % nasal spray PLACE 2 SPRAYS INTO BOTH NOSTRILS 2 (TWO) TIMES DAILY. USE IN EACH NOSTRIL AS DIRECTED 30 mL 1   BAYER CONTOUR NEXT TEST test strip CHECK BLOOD SUGAR TWICE DAILY 100 each 5   BAYER MICROLET LANCETS lancets Check blood sugar two times daily. 100 each 5   Blood Glucose Monitoring Suppl (BAYER CONTOUR NEXT MONITOR) w/Device KIT Check blood sugar two times daily. 1 kit 2   fluvastatin XL (LESCOL XL) 80 MG 24 hr tablet Take 1 tablet (80 mg total) by mouth daily. start taking every other day for the first 2 weeks then daily 30 tablet 3   gabapentin (NEURONTIN) 300 MG capsule Take 3 capsules (900 mg total) by mouth 2 (two) times daily. 180 capsule 2   lisinopril (ZESTRIL) 5 MG tablet Take 1 tablet (5 mg total) by mouth daily. 90 tablet 3   meloxicam (MOBIC) 7.5 MG tablet Take 1 tablet (7.5 mg total) by mouth daily. 30 tablet 0   metFORMIN (GLUCOPHAGE-XR) 500 MG 24 hr tablet Take 3 tablets (1,500 mg total) by mouth daily with breakfast AND 1 tablet (500 mg total) every evening. 4 tablets daily. 120 tablet 5   pantoprazole (PROTONIX) 40 MG tablet TAKE 1 TABLET (40 MG TOTAL) BY MOUTH TWICE A DAY BEFORE MEALS 180 tablet 0   Polyethylene Glycol 3350 (MIRALAX PO) Take by mouth as needed.     silver sulfADIAZINE (SILVADENE) 1 %  cream Apply 1 Application topically 2 (two) times daily. 50 g 0   sulfamethoxazole-trimethoprim (BACTRIM DS) 800-160 MG tablet Take 1 tablet by mouth 2 (two) times daily. 14 tablet 0   trimethoprim-polymyxin b (POLYTRIM) ophthalmic solution SMARTSIG:In Eye(s)     No current facility-administered   medications for this visit.    REVIEW OF SYSTEMS:  _0  denotes positive finding, _1  denotes negative finding Cardiac  Comments:  Chest pain or chest pressure:    Shortness of breath upon exertion:    Short of breath when lying flat:    Irregular heart rhythm:        Vascular    Pain in calf, thigh, or hip brought on by ambulation:    Pain in feet at night that wakes you up from your sleep:     Blood clot in your veins:    Leg swelling:         Pulmonary    Oxygen at home:    Productive cough:     Wheezing:         Neurologic    Sudden weakness in arms or legs:     Sudden numbness in arms or legs:     Sudden onset of difficulty speaking or slurred speech:    Temporary loss of vision in one eye:     Problems with dizziness:         Gastrointestinal    Blood in stool:     Vomited blood:         Genitourinary    Burning when urinating:     Blood in urine:        Psychiatric    Major depression:         Hematologic    Bleeding problems:    Problems with blood clotting too easily:        Skin    Rashes or ulcers:        Constitutional    Fever or chills:      PHYSICAL EXAM: Vitals:   01/14/22 1331  BP: (!) 140/81  Pulse: 82  Resp: 16  Temp: 97.8 F (36.6 C)  TempSrc: Temporal  SpO2: 96%  Weight: 218 lb (98.9 kg)  Height: _2  (1.803 m)    GENERAL: The patient is a well-nourished male, in no acute distress. The vital signs are documented above. CARDIAC: There is a regular rate and rhythm.  VASCULAR:  Palpable femoral pulses bilaterally Left popliteal artery pulse palpable No palpable left pedal pulses Left 2nd toe ulcer as pictured  Right DP  palpable PULMONARY: No respiratory distress ABDOMEN: Soft and non-tender. MUSCULOSKELETAL: There are no major deformities or cyanosis. NEUROLOGIC: No focal weakness or paresthesias are detected. PSYCHIATRIC: The patient has a normal affect.    DATA:   ABIs on the left are 0.65 with abnormal monophasic waveform at the ankle and toe pressure of 0  Assessment/Plan:  71 year old male presents with critical limb ischemia with tissue loss in the left lower extremity.  He has an abnormal ABI of 0.6 on the left with a monophasic waveform at the ankle.  He also has a flat toe pressure of 0.  I discussed this is inadequate for wound healing.  I recommended aortogram with lower extremity arteriogram with a focus on the left leg.  He does have a popliteal pulse on the left side suspect this is either tibial or small vessel disease.  Unfortunately I am out of town this Thursday and next Thursday is Thanksgiving on my normal angiogram day.  Discussed we will get this scheduled with one of my partners as next available surgeon given this has progressed fairly quickly.  I am happy to do any open revascularization if he has no endovascular options as I discussed with him today.  Risks benefits discussed.  We will get him scheduled today.   Marty Heck, MD [facility] Office: [phone]

## 2022-01-14 NOTE — H&P (View-Only) (Signed)
Patient name: [name] MRN: [id number] DOB: [birth year] Sex: male  REASON FOR CONSULT: Left second toe ulcer  HPI: Philippe Gang. is a 71 y.o. male, with history of hypertension, hyperlipidemia, coronary artery disease, diabetes, prostate cancer that presents for evaluation of a left second toe ulcer.  He states this has been present for about 4 weeks.  States this was related to a dressing to offload pressure between his toes that rubbed a blister.  He states this has gotten much worse over the last 4 weeks.  He is followed by podiatry who has been debriding this and then advised him to put Betadine on the wound.  No previous lower extremity revascularizations.  Past Medical History:  Diagnosis Date   Allergy    seasonal   Arthritis    " all over me- back, hips, knees, hands, everywhere "   Cancer [facility])    Prostate cancer age 55; Lupron treatments followed by prostatectomy.   Cataract    left removed, forming right   Chronic constipation    x 23 yrs after prostate surgery   Diabetes mellitus without complication (HCC)    Diabetic retinal damage of both eyes ([place])    gets shots every month   GERD (gastroesophageal reflux disease)    Hyperlipidemia    Hypertension    Myocardial infarction [facility])    age 81; no stenting.   OSA (obstructive sleep apnea)    non-compliant with CPAP.   Sleep apnea    no cpap    Past Surgical History:  Procedure Laterality Date   APPENDECTOMY     CHOLECYSTECTOMY     PROSTATE SURGERY      Family History  Problem Relation Age of Onset   Cancer Mother        leukemia   Diabetes Mother    Cancer Father 7       lung cancer with mets   Colon cancer Maternal Grandmother    Heart disease Maternal Grandmother    Heart disease Maternal Grandfather    Heart disease Paternal Grandmother    Cancer Paternal Grandfather        lung, prostate   Esophageal cancer Neg Hx    Colon polyps Neg Hx    Stomach cancer Neg Hx    Rectal cancer  Neg Hx     SOCIAL HISTORY: Social History   Socioeconomic History   Marital status: Single    Spouse name: GF-Betty   Number of children: 2   Years of education: 11   Highest education level: Not on file  Occupational History   Occupation: Buyer, retail: [employer]   Tobacco Use   Smoking status: Never   Smokeless tobacco: Current    Types: Chew  Vaping Use   Vaping Use: Never used  Substance and Sexual Activity   Alcohol use: Yes    Comment: social   Drug use: Not Currently   Sexual activity: Not Currently  Other Topics Concern   Not on file  Social History Narrative   Marital status: divorced, girlfriend x 1977     Children: 1      Tobacco: none; chew tobacco      Alcohol: beers on weekends      Exercise: none   Social Determinants of Health   Financial Resource Strain: Low Risk  (11/25/2020)   Overall Financial Resource Strain (CARDIA)    Difficulty of Paying Living Expenses: Not hard at all  Food Insecurity: Food Insecurity Present (11/25/2020)   Hunger Vital Sign    Worried About Running Out of Food in the Last Year: Sometimes true    Ran Out of Food in the Last Year: Sometimes true  Transportation Needs: No Transportation Needs (11/25/2020)   PRAPARE - Hydrologist (Medical): No    Lack of Transportation (Non-Medical): No  Physical Activity: Insufficiently Active (11/25/2020)   Exercise Vital Sign    Days of Exercise per Week: 7 days    Minutes of Exercise per Session: 10 min  Stress: Stress Concern Present (11/25/2020)   [place]    Feeling of Stress : To some extent  Social Connections: Moderately Isolated (11/25/2020)   Social Connection and Isolation Panel [NHANES]    Frequency of Communication with Friends and Family: More than three times a week    Frequency of Social Gatherings with Friends and Family: More than three times a week     Attends Religious Services: More than 4 times per year    Active Member of [employer] or Organizations: No    Attends Archivist Meetings: Never    Marital Status: Divorced  Human resources officer Violence: Not At Risk (11/25/2020)   Humiliation, Afraid, Rape, and Kick questionnaire    Fear of Current or Ex-Partner: No    Emotionally Abused: No    Physically Abused: No    Sexually Abused: No    Allergies  Allergen Reactions   Cymbalta [Duloxetine Hcl] Nausea And Vomiting   Darvocet [Propoxyphene N-Acetaminophen] Hives    Current Outpatient Medications  Medication Sig Dispense Refill   aspirin 81 MG tablet Take 81 mg by mouth daily.     azelastine (ASTELIN) 0.1 % nasal spray PLACE 2 SPRAYS INTO BOTH NOSTRILS 2 (TWO) TIMES DAILY. USE IN EACH NOSTRIL AS DIRECTED 30 mL 1   BAYER CONTOUR NEXT TEST test strip CHECK BLOOD SUGAR TWICE DAILY 100 each 5   BAYER MICROLET LANCETS lancets Check blood sugar two times daily. 100 each 5   Blood Glucose Monitoring Suppl (BAYER CONTOUR NEXT MONITOR) w/Device KIT Check blood sugar two times daily. 1 kit 2   fluvastatin XL (LESCOL XL) 80 MG 24 hr tablet Take 1 tablet (80 mg total) by mouth daily. start taking every other day for the first 2 weeks then daily 30 tablet 3   gabapentin (NEURONTIN) 300 MG capsule Take 3 capsules (900 mg total) by mouth 2 (two) times daily. 180 capsule 2   lisinopril (ZESTRIL) 5 MG tablet Take 1 tablet (5 mg total) by mouth daily. 90 tablet 3   meloxicam (MOBIC) 7.5 MG tablet Take 1 tablet (7.5 mg total) by mouth daily. 30 tablet 0   metFORMIN (GLUCOPHAGE-XR) 500 MG 24 hr tablet Take 3 tablets (1,500 mg total) by mouth daily with breakfast AND 1 tablet (500 mg total) every evening. 4 tablets daily. 120 tablet 5   pantoprazole (PROTONIX) 40 MG tablet TAKE 1 TABLET (40 MG TOTAL) BY MOUTH TWICE A DAY BEFORE MEALS 180 tablet 0   Polyethylene Glycol 3350 (MIRALAX PO) Take by mouth as needed.     silver sulfADIAZINE (SILVADENE) 1 %  cream Apply 1 Application topically 2 (two) times daily. 50 g 0   sulfamethoxazole-trimethoprim (BACTRIM DS) 800-160 MG tablet Take 1 tablet by mouth 2 (two) times daily. 14 tablet 0   trimethoprim-polymyxin b (POLYTRIM) ophthalmic solution SMARTSIG:In Eye(s)     No current facility-administered  medications for this visit.    REVIEW OF SYSTEMS:  _0  denotes positive finding, _1  denotes negative finding Cardiac  Comments:  Chest pain or chest pressure:    Shortness of breath upon exertion:    Short of breath when lying flat:    Irregular heart rhythm:        Vascular    Pain in calf, thigh, or hip brought on by ambulation:    Pain in feet at night that wakes you up from your sleep:     Blood clot in your veins:    Leg swelling:         Pulmonary    Oxygen at home:    Productive cough:     Wheezing:         Neurologic    Sudden weakness in arms or legs:     Sudden numbness in arms or legs:     Sudden onset of difficulty speaking or slurred speech:    Temporary loss of vision in one eye:     Problems with dizziness:         Gastrointestinal    Blood in stool:     Vomited blood:         Genitourinary    Burning when urinating:     Blood in urine:        Psychiatric    Major depression:         Hematologic    Bleeding problems:    Problems with blood clotting too easily:        Skin    Rashes or ulcers:        Constitutional    Fever or chills:      PHYSICAL EXAM: Vitals:   01/14/22 1331  BP: (!) 140/81  Pulse: 82  Resp: 16  Temp: 97.8 F (36.6 C)  TempSrc: Temporal  SpO2: 96%  Weight: 218 lb (98.9 kg)  Height: _2  (1.803 m)    GENERAL: The patient is a well-nourished male, in no acute distress. The vital signs are documented above. CARDIAC: There is a regular rate and rhythm.  VASCULAR:  Palpable femoral pulses bilaterally Left popliteal artery pulse palpable No palpable left pedal pulses Left 2nd toe ulcer as pictured  Right DP  palpable PULMONARY: No respiratory distress ABDOMEN: Soft and non-tender. MUSCULOSKELETAL: There are no major deformities or cyanosis. NEUROLOGIC: No focal weakness or paresthesias are detected. PSYCHIATRIC: The patient has a normal affect.    DATA:   ABIs on the left are 0.65 with abnormal monophasic waveform at the ankle and toe pressure of 0  Assessment/Plan:  71 year old male presents with critical limb ischemia with tissue loss in the left lower extremity.  He has an abnormal ABI of 0.6 on the left with a monophasic waveform at the ankle.  He also has a flat toe pressure of 0.  I discussed this is inadequate for wound healing.  I recommended aortogram with lower extremity arteriogram with a focus on the left leg.  He does have a popliteal pulse on the left side suspect this is either tibial or small vessel disease.  Unfortunately I am out of town this Thursday and next Thursday is Thanksgiving on my normal angiogram day.  Discussed we will get this scheduled with one of my partners as next available surgeon given this has progressed fairly quickly.  I am happy to do any open revascularization if he has no endovascular options as I discussed with him today.  Risks benefits discussed.  We will get him scheduled today.   Marty Heck, MD [facility] Office: [phone]

## 2022-01-16 ENCOUNTER — Encounter (HOSPITAL_BASED_OUTPATIENT_CLINIC_OR_DEPARTMENT_OTHER): Payer: Medicare HMO | Admitting: General Surgery

## 2022-01-16 DIAGNOSIS — L97812 Non-pressure chronic ulcer of other part of right lower leg with fat layer exposed: Secondary | ICD-10-CM | POA: Diagnosis not present

## 2022-01-16 DIAGNOSIS — E11621 Type 2 diabetes mellitus with foot ulcer: Secondary | ICD-10-CM | POA: Diagnosis not present

## 2022-01-16 DIAGNOSIS — E11622 Type 2 diabetes mellitus with other skin ulcer: Secondary | ICD-10-CM | POA: Diagnosis not present

## 2022-01-16 DIAGNOSIS — L97526 Non-pressure chronic ulcer of other part of left foot with bone involvement without evidence of necrosis: Secondary | ICD-10-CM | POA: Diagnosis not present

## 2022-01-16 DIAGNOSIS — T24231A Burn of second degree of right lower leg, initial encounter: Secondary | ICD-10-CM | POA: Diagnosis not present

## 2022-01-17 ENCOUNTER — Other Ambulatory Visit: Payer: Self-pay

## 2022-01-17 ENCOUNTER — Ambulatory Visit (HOSPITAL_COMMUNITY)
Admission: RE | Admit: 2022-01-17 | Discharge: 2022-01-17 | Disposition: A | Discharge status: Home / Self Care | Payer: Medicare HMO | Attending: Vascular Surgery | Admitting: Vascular Surgery

## 2022-01-17 ENCOUNTER — Encounter (HOSPITAL_COMMUNITY): Payer: Self-pay | Admitting: Vascular Surgery

## 2022-01-17 ENCOUNTER — Encounter (HOSPITAL_COMMUNITY)
Admission: RE | Disposition: A | Discharge status: Home / Self Care | Payer: Self-pay | Source: Home / Self Care | Attending: Vascular Surgery

## 2022-01-17 ENCOUNTER — Ambulatory Visit (HOSPITAL_BASED_OUTPATIENT_CLINIC_OR_DEPARTMENT_OTHER): Payer: Medicare HMO

## 2022-01-17 DIAGNOSIS — Z7984 Long term (current) use of oral hypoglycemic drugs: Secondary | ICD-10-CM | POA: Diagnosis not present

## 2022-01-17 DIAGNOSIS — E785 Hyperlipidemia, unspecified: Secondary | ICD-10-CM | POA: Insufficient documentation

## 2022-01-17 DIAGNOSIS — I10 Essential (primary) hypertension: Secondary | ICD-10-CM | POA: Insufficient documentation

## 2022-01-17 DIAGNOSIS — Z8546 Personal history of malignant neoplasm of prostate: Secondary | ICD-10-CM | POA: Insufficient documentation

## 2022-01-17 DIAGNOSIS — F17290 Nicotine dependence, other tobacco product, uncomplicated: Secondary | ICD-10-CM | POA: Diagnosis not present

## 2022-01-17 DIAGNOSIS — E11621 Type 2 diabetes mellitus with foot ulcer: Secondary | ICD-10-CM | POA: Insufficient documentation

## 2022-01-17 DIAGNOSIS — L97529 Non-pressure chronic ulcer of other part of left foot with unspecified severity: Secondary | ICD-10-CM | POA: Insufficient documentation

## 2022-01-17 DIAGNOSIS — I2510 Atherosclerotic heart disease of native coronary artery without angina pectoris: Secondary | ICD-10-CM | POA: Diagnosis not present

## 2022-01-17 DIAGNOSIS — Z01810 Encounter for preprocedural cardiovascular examination: Secondary | ICD-10-CM | POA: Diagnosis not present

## 2022-01-17 DIAGNOSIS — I70245 Atherosclerosis of native arteries of left leg with ulceration of other part of foot: Secondary | ICD-10-CM | POA: Diagnosis not present

## 2022-01-17 DIAGNOSIS — E1151 Type 2 diabetes mellitus with diabetic peripheral angiopathy without gangrene: Secondary | ICD-10-CM | POA: Diagnosis not present

## 2022-01-17 DIAGNOSIS — I70222 Atherosclerosis of native arteries of extremities with rest pain, left leg: Secondary | ICD-10-CM

## 2022-01-17 HISTORY — PX: ABDOMINAL AORTOGRAM W/LOWER EXTREMITY: CATH118223

## 2022-01-17 LAB — POCT I-STAT, CHEM 8
BUN: 14 mg/dL (ref 8–23)
Calcium, Ion: 1.24 mmol/L (ref 1.15–1.40)
Chloride: 102 mmol/L (ref 98–111)
Creatinine, Ser: 0.9 mg/dL (ref 0.61–1.24)
Glucose, Bld: 136 mg/dL — ABNORMAL HIGH (ref 70–99)
HCT: 43 % (ref 39.0–52.0)
Hemoglobin: 14.6 g/dL (ref 13.0–17.0)
Potassium: 4.5 mmol/L (ref 3.5–5.1)
Sodium: 141 mmol/L (ref 135–145)
TCO2: 29 mmol/L (ref 22–32)

## 2022-01-17 LAB — GLUCOSE, CAPILLARY: Glucose-Capillary: 178 mg/dL — ABNORMAL HIGH (ref 70–99)

## 2022-01-17 SURGERY — ABDOMINAL AORTOGRAM W/LOWER EXTREMITY
Anesthesia: LOCAL

## 2022-01-17 MED ORDER — SODIUM CHLORIDE 0.9 % IV SOLN: INTRAVENOUS | Status: DC

## 2022-01-17 MED ORDER — HEPARIN (PORCINE) IN NACL 1000-0.9 UT/500ML-% IV SOLN
INTRAVENOUS | Status: DC | PRN
  Administered 2022-01-17 (×2): 500 mL

## 2022-01-17 MED ORDER — ACETAMINOPHEN 325 MG PO TABS: 650.0000 mg | ORAL_TABLET | ORAL | Status: DC | PRN

## 2022-01-17 MED ORDER — FENTANYL CITRATE (PF) 100 MCG/2ML IJ SOLN
INTRAMUSCULAR | Status: DC | PRN
  Administered 2022-01-17: 50 ug via INTRAVENOUS

## 2022-01-17 MED ORDER — SODIUM CHLORIDE 0.9 % WEIGHT BASED INFUSION: 1.0000 mL/kg/h | INTRAVENOUS | Status: DC

## 2022-01-17 MED ORDER — HYDRALAZINE HCL 20 MG/ML IJ SOLN: 5.0000 mg | INTRAMUSCULAR | Status: DC | PRN

## 2022-01-17 MED ORDER — SODIUM CHLORIDE 0.9% FLUSH: 3.0000 mL | Freq: Two times a day (BID) | INTRAVENOUS | Status: DC

## 2022-01-17 MED ORDER — IODIXANOL 320 MG/ML IV SOLN
INTRAVENOUS | Status: DC | PRN
  Administered 2022-01-17: 172 mL via INTRA_ARTERIAL

## 2022-01-17 MED ORDER — LIDOCAINE HCL (PF) 1 % IJ SOLN
INTRAMUSCULAR | Status: DC | PRN
  Administered 2022-01-17: 15 mL via INTRADERMAL

## 2022-01-17 MED ORDER — FENTANYL CITRATE (PF) 100 MCG/2ML IJ SOLN
INTRAMUSCULAR | Status: AC
  Filled 2022-01-17: qty 2

## 2022-01-17 MED ORDER — SODIUM CHLORIDE 0.9% FLUSH: 3.0000 mL | INTRAVENOUS | Status: DC | PRN

## 2022-01-17 MED ORDER — MIDAZOLAM HCL 2 MG/2ML IJ SOLN
INTRAMUSCULAR | Status: DC | PRN
  Administered 2022-01-17: 1 mg via INTRAVENOUS

## 2022-01-17 MED ORDER — LABETALOL HCL 5 MG/ML IV SOLN: 10.0000 mg | INTRAVENOUS | Status: DC | PRN

## 2022-01-17 MED ORDER — ONDANSETRON HCL 4 MG/2ML IJ SOLN: 4.0000 mg | Freq: Four times a day (QID) | INTRAMUSCULAR | Status: DC | PRN

## 2022-01-17 MED ORDER — HEPARIN (PORCINE) IN NACL 1000-0.9 UT/500ML-% IV SOLN
INTRAVENOUS | Status: AC
  Filled 2022-01-17: qty 1000

## 2022-01-17 MED ORDER — MIDAZOLAM HCL 2 MG/2ML IJ SOLN
INTRAMUSCULAR | Status: AC
  Filled 2022-01-17: qty 2

## 2022-01-17 MED ORDER — SODIUM CHLORIDE 0.9 % IV SOLN: 250.0000 mL | INTRAVENOUS | Status: DC | PRN

## 2022-01-17 MED ORDER — LIDOCAINE HCL (PF) 1 % IJ SOLN
INTRAMUSCULAR | Status: AC
  Filled 2022-01-17: qty 30

## 2022-01-17 SURGICAL SUPPLY — 14 items
CATH ANGIO 5F PIGTAIL 65CM (CATHETERS) IMPLANT
CATH CROSS OVER TEMPO 5F (CATHETERS) IMPLANT
CATH STRAIGHT 5FR 65CM (CATHETERS) IMPLANT
CLOSURE MYNX CONTROL 5F (Vascular Products) IMPLANT
KIT MICROPUNCTURE NIT STIFF (SHEATH) IMPLANT
KIT PV (KITS) ×1 IMPLANT
SHEATH PINNACLE 5F 10CM (SHEATH) IMPLANT
SHEATH PROBE COVER 6X72 (BAG) IMPLANT
STOPCOCK MORSE 400PSI 3WAY (MISCELLANEOUS) IMPLANT
SYR MEDRAD MARK 7 150ML (SYRINGE) ×1 IMPLANT
TRANSDUCER W/STOPCOCK (MISCELLANEOUS) ×1 IMPLANT
TRAY PV CATH (CUSTOM PROCEDURE TRAY) ×1 IMPLANT
TUBING CIL FLEX 10 FLL-RA (TUBING) IMPLANT
WIRE HITORQ VERSACORE ST 145CM (WIRE) IMPLANT

## 2022-01-17 NOTE — Op Note (Signed)
PATIENT: [name]      MRN: [id number] DOB: March 08, 1950    DATE OF PROCEDURE: 01/17/2022  INDICATIONS:    [name] is a 71 y.o. male who was seen in the office by Dr. Carlis Abbott with a nonhealing left second toe wound.  He had evidence of infrainguinal arterial occlusive disease and was set up for arteriography and possible intervention.  PROCEDURE:    Conscious sedation Ultrasound-guided access to the right common femoral artery Aortogram with bilateral iliac arteriogram Selective catheterization of the left superficial femoral artery (third order catheterization) with left lower extremity runoff Retrograde right femoral arteriogram with right lower extremity runoff Minx closure of the right common femoral artery  SURGEON: [name] Clayton Dock, MD, FACS  ANESTHESIA: Local with sedation  EBL: Minimal  TECHNIQUE: The patient was brought to the peripheral vascular lab and was sedated. The period of conscious sedation was 52 minutes.  During that time period, I was present face-to-face 100% of the time.  The patient was administered 1 mg of Versed and 50 mcg of fentanyl. The patient's heart rate, blood pressure, and oxygen saturation were monitored by the nurse continuously during the procedure.  Both groins were prepped and draped in the usual sterile fashion.  Under ultrasound guidance, after the skin was anesthetized, I cannulated the right common femoral artery with a micropuncture needle and a micropuncture sheath was introduced over a wire.  This was exchanged for a 5 [place] sheath over a Bentson wire.  By ultrasound the femoral artery was patent. A real-time image was obtained and sent to the server.  The pigtail catheter was positioned at the L1 vertebral body and flush aortogram obtained.  The catheter was in position above the aortic bifurcation and an oblique iliac projection was obtained.  I then exchanged the pigtail catheter for a crossover catheter which was  positioned into the proximal left common iliac artery.  The wire was advanced down into the common femoral artery and the catheter exchanged for a straight catheter.  Selective left external iliac arteriogram was obtained with left lower extremity runoff.  In order to get better visualization of the pedal vessels I advanced the wire down into the superficial femoral artery and advanced the straight catheter down into the superficial femoral artery for additional imaging.  This catheter was then removed and a retrograde right femoral arteriogram was obtained.  At the completion of the procedure the right common femoral artery was closed with a minx device.  No immediate complications were noted.  FINDINGS:   There is one renal artery on the right and 2 renal arteries on the left.  No renal artery stenosis is identified.  The infrarenal aorta is widely patent.  Bilateral common iliac, external iliac, and internal iliac arteries are patent. On the left side, which is the side of concern, the common femoral and deep femoral artery are patent.  The superficial femoral artery is patent without significant stenosis.  There is a 90% focal stenosis in the above-knee popliteal artery and then a subsequent 95% stenosis at the level of the knee in the popliteal artery.  The below-knee popliteal artery is patent below this.  The proximal anterior tibial, tibial peroneal trunk, proximal peroneal and proximal posterior tibial arteries are patent.  The anterior tibial artery however occludes proximally and there is reconstitution in the distal third of the leg with runoff onto the foot as the dorsalis pedis.  The peroneal is occluded proximally.  The  posterior tibial artery has severe diffuse disease throughout and does not runoff onto the foot. On the right side the common femoral, deep femoral, and superficial femoral arteries are patent.  There is moderate disease of the popliteal artery.  The peroneal artery is occluded.   There is a high takeoff of the posterior tibial artery with a moderate stenosis in the mid leg.  The anterior tibial artery has moderate diffuse disease.  CLINICAL NOTE: The patient did not have any good options for an endovascular approach.  He will be scheduled for a left SFA to anterior tibial artery bypass.  I will try to get his vein map today while he is in the hospital.    WIFI score: 2 space 1 space 1.  Clinical stage III.  Amputation risk moderate.  Potential benefit of revascularization moderate.  Clayton Mayo, MD, FACS [facility]  DATE OF DICTATION:   01/17/2022
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
## 2022-01-17 NOTE — Interval H&P Note (Signed)
History and Physical Interval Note:  01/17/2022 9:03 AM  [name]  has presented today for surgery, with the diagnosis of ichemia of left lower extremity.  The various methods of treatment have been discussed with the patient and family. After consideration of risks, benefits and other options for treatment, the patient has consented to  Procedure(s): ABDOMINAL AORTOGRAM W/LOWER EXTREMITY (N/A) as a surgical intervention.  The patient's history has been reviewed, patient examined, no change in status, stable for surgery.  I have reviewed the patient's chart and labs.  Questions were answered to the patient's satisfaction.     [name]

## 2022-01-17 NOTE — Progress Notes (Signed)
Pt and wife received d/c instructions, written and verbal instructions. All questions and concerns addressed. No bleeding or hematoma noted to site. PIV removed and intact. Denies any pain or discomfort. Pt in stable condition.

## 2022-01-17 NOTE — Progress Notes (Signed)
FLEET, HIGHAM ([id number]) [id number] Page 1 of 10 Frost Report for 01/16/2022 Arrival Information Details Patient Name: Date of Service: [name] Comment ID [name] 01/16/2022 2:45 PM Medical Record Number: [id number] Patient Account Number: [id number] Date of Birth/Sex: Treating RN: [birth year] (71 y.o. [name] Primary Care [name]: [name] Other Clinician: Referring Romelle Reiley: Treating Rodney Wigger/Extender: Salem Caster Weeks in Treatment: 1 Frost Information History Since Last Frost Added or deleted any medications: No Patient Arrived: Ambulatory Any new allergies or adverse reactions: No Arrival Time: 15:04 Had a fall or experienced change in No Accompanied By: sposue activities of daily living that may affect Transfer Assistance: None risk of falls: Patient Identification Verified: Yes Signs or symptoms of abuse/neglect since No Secondary Verification Process Completed: Yes last visito Patient Requires Transmission-Based Precautions: No Hospitalized since last Frost: No Patient Has Alerts: Yes Implantable device outside of the clinic No Patient Alerts: ABIs in clinic [name]: Has Dressing in Place as Prescribed: Yes Has Footwear/Offloading in Place as Yes Prescribed: Left: Surgical Shoe with Pressure Relief Insole Pain Present Now: Yes Electronic Signature(s) Signed: 01/16/2022 5:26:30 PM By: Baruch Gouty RN, BSN Entered By: Baruch Gouty on 01/16/2022 15:17:29 -------------------------------------------------------------------------------- Encounter Discharge Information Details Patient Name: Date of Service: [name] Comment ID [name] 01/16/2022 2:45 PM Medical Record Number: [id number] Patient Account Number: [id number] Date of Birth/Sex: Treating RN: [birth year] [name] (71 y.o. [name] Primary Care Vanetta Rule: [name] Other Clinician: Referring Aarian Cleaver: Treating Salvator Seppala/Extender: Salem Caster Weeks in Treatment: 1 Encounter Discharge Information Items Post Procedure Vitals Discharge Condition: Stable Temperature (F): 98.2 Ambulatory Status: Ambulatory Pulse (bpm): 74 Discharge Destination: Home Respiratory Rate (breaths/min): 18 Transportation: Private Auto Blood Pressure (mmHg): 144/82 Accompanied By: spouse Schedule Follow-up Appointment: Yes Clinical Summary of Care: Patient Declined Electronic Signature(s) Signed: 01/16/2022 5:26:30 PM By: Baruch Gouty RN, BSN Entered By: Baruch Gouty on 01/16/2022 15:53:21 Frost, [name] ([id number]) [id number] Page 2 of 10 -------------------------------------------------------------------------------- Lower Extremity Assessment Details Patient Name: Date of Service: [name] Comment ID [name] 01/16/2022 2:45 PM Medical Record Number: [id number] Patient Account Number: [id number] Date of Birth/Sex: Treating RN: [name] (71 y.o. [name] Primary Care Tauren Delbuono: [name] Other Clinician: Referring Naijah Lacek: Treating Delshawn Stech/Extender: Salem Caster Weeks in Treatment: 1 Edema Assessment Assessed: [Left: No] [Right: No] Edema: [Left: No] [Right: No] Calf Left: Right: Point of Measurement: From Medial Instep 35.7 cm 33.6 cm Ankle Left: Right: Point of Measurement: From Medial Instep 22.1 cm 21.5 cm Vascular Assessment Pulses: Dorsalis Pedis Palpable: [Left:No] [Right:No] Electronic Signature(s) Signed: 01/16/2022 5:26:30 PM By: Baruch Gouty RN, BSN Entered By: Baruch Gouty on 01/16/2022 15:22:38 -------------------------------------------------------------------------------- Multi Wound Chart Details Patient Name: Date of Service: [name] Comment ID [name] 01/16/2022 2:45 PM Medical Record Number: [id number] Patient Account Number:  [id number] Date of Birth/Sex: Treating RN: [birth year]/[name] (71 y.o. M) Primary Care Janesa Dockery: [name] Other Clinician: Referring Kimmerly Lora: Treating Yareth Kearse/Extender: Richelle Ito, Olugbemiga Weeks in Treatment: 1 Vital Signs Height(in): 70 Capillary Blood Glucose(mg/dl): 142 Weight(lbs): 222 Pulse(bpm): 74 Body Mass Index(BMI): 31.9 Blood Pressure(mmHg): 144/82 Temperature(F): 98.2 Respiratory Rate(breaths/min): 18 [1:Photos:] [N/A:N/A [id number] Page 3 of 10] Left, Medial T Second oe Right, Medial Ankle N/A Wound Location: Blister Thermal Burn N/A Wounding Event: Diabetic Wound/Ulcer of the Lower 2nd degree Burn N/A Primary Etiology: Extremity Cataracts, Sleep Apnea, Hypertension, Cataracts, Sleep Apnea, Hypertension, N/A Comorbid History:  Myocardial Infarction, Type II Diabetes Myocardial Infarction, Type II Diabetes 12/17/2021 11/20/2021 N/A Date Acquired: 1 1 N/A Weeks of Treatment: Open Open N/A Wound Status: No No N/A Wound Recurrence: 0.6x0.5x0.1 0.4x0.4x0.1 N/A Measurements L x W x D (cm) 0.236 0.126 N/A A (cm) : rea 0.024 0.013 N/A Volume (cm) : -20.40% 19.70% N/A % Reduction in A rea: -20.00% 18.80% N/A % Reduction in Volume: Grade 2 Full Thickness Without Exposed N/A Classification: Support Structures Medium Medium N/A Exudate A mount: Serous Serous N/A Exudate Type: amber amber N/A Exudate Color: Distinct, outline attached Distinct, outline attached N/A Wound Margin: Small (1-33%) Medium (34-66%) N/A Granulation A mount: Pink Red, Pink N/A Granulation Quality: Large (67-100%) Medium (34-66%) N/A Necrotic A mount: Fat Layer (Subcutaneous Tissue): Yes Fat Layer (Subcutaneous Tissue): Yes N/A Exposed Structures: Tendon: Yes Fascia: No Bone: Yes Tendon: No Fascia: No Muscle: No Muscle: No Joint: No Joint: No Bone: No None None N/A Epithelialization: Debridement - Selective/Open Wound  Debridement - Selective/Open Wound N/A Debridement: Pre-procedure Verification/Time Out 15:30 15:30 N/A Taken: Lidocaine 4% Topical Solution Lidocaine 4% Topical Solution N/A Pain Control: [employer] N/A Tissue Debrided: Non-Viable Tissue Non-Viable Tissue N/A Level: 0.3 0.16 N/A Debridement A (sq cm): rea Curette Curette N/A Instrument: Minimum Minimum N/A Bleeding: Pressure Pressure N/A Hemostasis A chieved: 0 0 N/A Procedural Pain: 0 0 N/A Post Procedural Pain: Procedure was tolerated well Procedure was tolerated well N/A Debridement Treatment Response: 0.6x0.5x0.1 0.4x0.4x0.1 N/A Post Debridement Measurements L x W x D (cm) 0.024 0.013 N/A Post Debridement Volume: (cm) No Abnormalities Noted Scarring: Yes N/A Periwound Skin Texture: Dry/Scaly: Yes Dry/Scaly: Yes N/A Periwound Skin Moisture: No Abnormalities Noted No Abnormalities Noted N/A Periwound Skin Color: No Abnormality No Abnormality N/A Temperature: N/A Yes N/A Tenderness on Palpation: Debridement Debridement N/A Procedures Performed: Treatment Notes Wound #1 (Toe Second) Wound Laterality: Left, Medial Cleanser Soap and Water Discharge Instruction: May shower and wash wound with dial antibacterial soap and water prior to dressing change. Wound Cleanser Discharge Instruction: Cleanse the wound with wound cleanser prior to applying a clean dressing using gauze sponges, not tissue or cotton balls. Peri-Wound Care Topical Mupirocin Ointment Discharge Instruction: Apply Mupirocin (Bactroban) as instructed Primary Dressing KerraCel Ag Gelling Fiber Dressing, 2x2 in (silver alginate) Discharge Instruction: Apply silver alginate to wound bed as instructed Secondary Dressing Woven Gauze Sponges 2x2 in Discharge Instruction: Apply over primary dressing as directed. Secured With [employer], Loralee Pacas ([id number]) [id number] Page 4 of 10 Conforming Stretch Gauze Bandage, Sterile  2x75 (in/in) Discharge Instruction: Secure with stretch gauze as directed. Transpore Surgical Tape, 2x10 (in/yd) Discharge Instruction: Secure dressing with tape as directed. Compression Wrap Compression Stockings Add-Ons Wound #2 (Ankle) Wound Laterality: Right, Medial Cleanser Soap and Water Discharge Instruction: May shower and wash wound with dial antibacterial soap and water prior to dressing change. Wound Cleanser Discharge Instruction: Cleanse the wound with wound cleanser prior to applying a clean dressing using gauze sponges, not tissue or cotton balls. Peri-Wound Care Skin Prep Discharge Instruction: Use skin prep as directed Topical Mupirocin Ointment Discharge Instruction: Apply Mupirocin (Bactroban) as instructed Primary Dressing KerraCel Ag Gelling Fiber Dressing, 2x2 in (silver alginate) Discharge Instruction: Apply silver alginate to wound bed as instructed Secondary Dressing ALLEVYN Gentle Border, 3x3 (in/in) Discharge Instruction: Apply over primary dressing as directed. Secured With Compression Wrap Compression Stockings Environmental education officer) Signed: 01/16/2022 3:54:38 PM By: Fredirick Maudlin MD FACS Entered By: Fredirick Maudlin on 01/16/2022 15:54:38 -------------------------------------------------------------------------------- Multi-Disciplinary Care Plan Details Patient Name:  Date of Service: [name] Comment ID [name] 01/16/2022 2:45 PM Medical Record Number: [id number] Patient Account Number: [id number] Date of Birth/Sex: Treating RN: [name] (71 y.o. [name] Primary Care Steel Kerney: [name] Other Clinician: Referring Elyon Zoll: Treating Andrew Soria/Extender: Salem Caster Weeks in Treatment: 1 Active Inactive Necrotic Tissue Nursing Diagnoses: Impaired tissue integrity related to necrotic/devitalized tissue Knowledge deficit related to management of necrotic/devitalized tissue [name], [name]  ([id number]) [id number] Page 5 of 10 Goals: Necrotic/devitalized tissue will be minimized in the wound bed Date Initiated: 01/08/2022 Target Resolution Date: 03/07/2022 Goal Status: Active Patient/caregiver will verbalize understanding of reason and process for debridement of necrotic tissue Date Initiated: 01/08/2022 Target Resolution Date: 03/07/2022 Goal Status: Active Interventions: Assess patient pain level pre-, during and post procedure and prior to discharge Provide education on necrotic tissue and debridement process Treatment Activities: Apply topical anesthetic as ordered : 01/08/2022 Notes: Wound/Skin Impairment Nursing Diagnoses: Impaired tissue integrity Knowledge deficit related to ulceration/compromised skin integrity Goals: Patient/caregiver will verbalize understanding of skin care regimen Date Initiated: 01/08/2022 Target Resolution Date: 03/07/2022 Goal Status: Active Interventions: Assess ulceration(s) every Frost Treatment Activities: Skin care regimen initiated : 01/08/2022 Topical wound management initiated : 01/08/2022 Notes: Electronic Signature(s) Signed: 01/16/2022 5:26:30 PM By: Baruch Gouty RN, BSN Entered By: Baruch Gouty on 01/16/2022 15:30:27 -------------------------------------------------------------------------------- Pain Assessment Details Patient Name: Date of Service: Clayton Dossie Arbour V ID [name] 01/16/2022 2:45 PM Medical Record Number: [id number] Patient Account Number: [id number] Date of Birth/Sex: Treating RN: [birth year] (71 y.o. [name] Primary Care Yazhini Mcaulay: [name] Other Clinician: Referring Wilburta Milbourn: Treating Sabriyah Wilcher/Extender: Salem Caster Weeks in Treatment: 1 Active Problems Location of Pain Severity and Description of Pain Patient Has Paino Yes Site Locations Pain Location: [name], [name] ([id number]) [id number] Page 6 of 10 Pain  Location: Generalized Pain, Pain in Ulcers With Dressing Change: Yes Duration of the Pain. Constant / Intermittento Constant Rate the pain. Current Pain Level: 7 Character of Pain Describe the Pain: Aching, Other: pins Pain Management and Medication Current Pain Management: Medication: Yes Is the Current Pain Management Adequate: Adequate How does your wound impact your activities of daily livingo Sleep: Yes Bathing: No Appetite: No Relationship With Others: No Bladder Continence: No Emotions: Yes Bowel Continence: No Work: No Toileting: No Drive: No Dressing: No Hobbies: Astronomer) Signed: 01/16/2022 5:26:30 PM By: Baruch Gouty RN, BSN Entered By: Baruch Gouty on 01/16/2022 15:20:14 -------------------------------------------------------------------------------- Patient/Caregiver Education Details Patient Name: Date of Service: [name] Comment ID [name] 11/16/2023andnbsp2:45 PM Medical Record Number: [id number] Patient Account Number: [id number] Date of Birth/Gender: Treating RN: [name] (71 y.o. [name] Primary Care Physician: [name] Other Clinician: Referring Physician: Treating Physician/Extender: Salem Caster Weeks in Treatment: 1 Education Assessment Education Provided To: Patient Education Topics Provided Tissue Oxygenation: Methods: Explain/Verbal Responses: Reinforcements needed, State content correctly Wound Debridement: Methods: Explain/Verbal Responses: Reinforcements needed, State content correctly Electronic Signature(s) Signed: 01/16/2022 5:26:30 PM By: Baruch Gouty RN, BSN [name], [name] ([id number]) [phone].pdf Page 7 of 10 Entered By: Baruch Gouty on 01/16/2022 15:32:26 -------------------------------------------------------------------------------- Wound Assessment Details Patient Name: Date of Service: [name] Comment ID [name] 01/16/2022 2:45  PM Medical Record Number: [id number] Patient Account Number: [id number] Date of Birth/Sex: Treating RN: [name], [name] (71 y.o. [name] Primary Care [name]: [name] Other Clinician: Referring Dionta Larke: Treating Ruthvik Barnaby/Extender: Salem Caster Weeks in Treatment: 1 Wound Status Wound Number: 1 Primary Diabetic Wound/Ulcer of the  Lower Extremity Etiology: Wound Location: Left, Medial T Second oe Wound Open Wounding Event: Blister Status: Date Acquired: 12/17/2021 Comorbid Cataracts, Sleep Apnea, Hypertension, Myocardial Infarction, Weeks Of Treatment: 1 History: Type II Diabetes Clustered Wound: No Photos Wound Measurements Length: (cm) 0.6 Width: (cm) 0.5 Depth: (cm) 0.1 Area: (cm) 0.236 Volume: (cm) 0.024 % Reduction in Area: -20.4% % Reduction in Volume: -20% Epithelialization: None Tunneling: No Undermining: No Wound Description Classification: Grade 2 Wound Margin: Distinct, outline attached Exudate Amount: Medium Exudate Type: Serous Exudate Color: amber Foul Odor After Cleansing: No Slough/Fibrino Yes Wound Bed Granulation Amount: Small (1-33%) Exposed Structure Granulation Quality: Pink Fascia Exposed: No Necrotic Amount: Large (67-100%) Fat Layer (Subcutaneous Tissue) Exposed: Yes Necrotic Quality: Adherent Slough Tendon Exposed: Yes Muscle Exposed: No Joint Exposed: No Bone Exposed: Yes Periwound Skin Texture Texture Color No Abnormalities Noted: Yes No Abnormalities Noted: Yes Moisture Temperature / Pain No Abnormalities Noted: No Temperature: No Abnormality Dry / Scaly: Yes Treatment Notes [place], [place] ([id number]) [id number] Page 8 of 10 Wound #1 (Toe Second) Wound Laterality: Left, Medial Cleanser Soap and Water Discharge Instruction: May shower and wash wound with dial antibacterial soap and water prior to dressing change. Wound Cleanser Discharge Instruction:  Cleanse the wound with wound cleanser prior to applying a clean dressing using gauze sponges, not tissue or cotton balls. Peri-Wound Care Topical Mupirocin Ointment Discharge Instruction: Apply Mupirocin (Bactroban) as instructed Primary Dressing KerraCel Ag Gelling Fiber Dressing, 2x2 in (silver alginate) Discharge Instruction: Apply silver alginate to wound bed as instructed Secondary Dressing Woven Gauze Sponges 2x2 in Discharge Instruction: Apply over primary dressing as directed. Secured With Conforming Stretch Gauze Bandage, Sterile 2x75 (in/in) Discharge Instruction: Secure with stretch gauze as directed. Transpore Surgical Tape, 2x10 (in/yd) Discharge Instruction: Secure dressing with tape as directed. Compression Wrap Compression Stockings Add-Ons Electronic Signature(s) Signed: 01/16/2022 5:26:30 PM By: Baruch Gouty RN, BSN Entered By: Baruch Gouty on 01/16/2022 15:36:37 -------------------------------------------------------------------------------- Wound Assessment Details Patient Name: Date of Service: Clayton Dossie Arbour V ID [name] 01/16/2022 2:45 PM Medical Record Number: [id number] Patient Account Number: [id number] Date of Birth/Sex: Treating RN: [birth year][name] (71 y.o. [name] Primary Care Cresencia Asmus: [name] Other Clinician: Referring Taia Bramlett: Treating Prescious Hurless/Extender: Salem Caster Weeks in Treatment: 1 Wound Status Wound Number: 2 Primary 2nd degree Burn Etiology: Wound Location: Right, Medial Ankle Wound Open Wounding Event: Thermal Burn Status: Date Acquired: 11/20/2021 Comorbid Cataracts, Sleep Apnea, Hypertension, Myocardial Infarction, Weeks Of Treatment: 1 History: Type II Diabetes Clustered Wound: No Photos [name], [name] ([id number]) [id number] Page 9 of 10 Wound Measurements Length: (cm) 0.4 Width: (cm) 0.4 Depth: (cm) 0.1 Area: (cm) 0.126 Volume: (cm) 0.013 % Reduction  in Area: 19.7% % Reduction in Volume: 18.8% Epithelialization: None Tunneling: No Undermining: No Wound Description Classification: Full Thickness Without Exposed Support Structures Wound Margin: Distinct, outline attached Exudate Amount: Medium Exudate Type: Serous Exudate Color: amber Foul Odor After Cleansing: No Slough/Fibrino Yes Wound Bed Granulation Amount: Medium (34-66%) Exposed Structure Granulation Quality: Red, Pink Fascia Exposed: No Necrotic Amount: Medium (34-66%) Fat Layer (Subcutaneous Tissue) Exposed: Yes Necrotic Quality: Adherent Slough Tendon Exposed: No Muscle Exposed: No Joint Exposed: No Bone Exposed: No Periwound Skin Texture Texture Color No Abnormalities Noted: Yes No Abnormalities Noted: Yes Moisture Temperature / Pain No Abnormalities Noted: Yes Temperature: No Abnormality Tenderness on Palpation: Yes Treatment Notes Wound #2 (Ankle) Wound Laterality: Right, Medial Cleanser Soap and Water Discharge Instruction: May shower and wash wound with dial antibacterial soap and water  prior to dressing change. Wound Cleanser Discharge Instruction: Cleanse the wound with wound cleanser prior to applying a clean dressing using gauze sponges, not tissue or cotton balls. Peri-Wound Care Skin Prep Discharge Instruction: Use skin prep as directed Topical Mupirocin Ointment Discharge Instruction: Apply Mupirocin (Bactroban) as instructed Primary Dressing KerraCel Ag Gelling Fiber Dressing, 2x2 in (silver alginate) Discharge Instruction: Apply silver alginate to wound bed as instructed Secondary Dressing ALLEVYN Gentle Border, 3x3 (in/in) Discharge Instruction: Apply over primary dressing as directed. Secured With [employer], Erice Frost ([id number]) [id number] Page 10 of 10 Compression Stockings Add-Ons Electronic Signature(s) Signed: 01/16/2022 5:26:30 PM By: Baruch Gouty RN, BSN Entered By: Baruch Gouty  on 01/16/2022 15:28:17 -------------------------------------------------------------------------------- Vitals Details Patient Name: Date of Service: Clayton Devona Konig, Clayton V ID [name] 01/16/2022 2:45 PM Medical Record Number: [id number] Patient Account Number: [id number] Date of Birth/Sex: Treating RN: [birth year] (71 y.o. [name] Primary Care Lanita Stammen: [name] Other Clinician: Referring Mutasim Tuckey: Treating Dione Mccombie/Extender: Salem Caster Weeks in Treatment: 1 Vital Signs Time Taken: 15:18 Temperature (F): 98.2 Height (in): 70 Pulse (bpm): 74 Weight (lbs): 222 Respiratory Rate (breaths/min): 18 Body Mass Index (BMI): 31.9 Blood Pressure (mmHg): 144/82 Capillary Blood Glucose (mg/dl): 142 Reference Range: 80 - 120 mg / dl Notes glucose per pt report this am Electronic Signature(s) Signed: 01/16/2022 5:26:30 PM By: Baruch Gouty RN, BSN Entered By: Baruch Gouty on 01/16/2022 15:18:49

## 2022-01-17 NOTE — Progress Notes (Signed)
Post-ambulation done, pt tolerated well. No bleeding or hematoma noted. will continue to monitor.

## 2022-01-20 ENCOUNTER — Other Ambulatory Visit: Payer: Self-pay

## 2022-01-20 ENCOUNTER — Encounter (HOSPITAL_BASED_OUTPATIENT_CLINIC_OR_DEPARTMENT_OTHER): Payer: Medicare HMO | Admitting: General Surgery

## 2022-01-20 ENCOUNTER — Encounter (HOSPITAL_COMMUNITY): Payer: Self-pay | Admitting: Vascular Surgery

## 2022-01-20 DIAGNOSIS — I70222 Atherosclerosis of native arteries of extremities with rest pain, left leg: Secondary | ICD-10-CM

## 2022-01-20 NOTE — Progress Notes (Signed)
PCP - Tresa Garter, MD  EKG - 01/17/22  CPAP - Says he was tested and was told he didn't need the CPAP after losing weight  Fasting Blood Sugar - 70-120's Checks Blood Sugar Once/day  Aspirin Instructions: Con't  ERAS Protcol - NPO  Anesthesia review: Y  Patient verbally denies any shortness of breath, fever, cough and chest pain during phone call   -------------  SDW INSTRUCTIONS given:  Your procedure is scheduled on 01/22/22.  Report to [facility] Main Entrance "A" at 0800 A.M., and check in at the Admitting office.  Call this number if you have problems the morning of surgery:  [phone]   Remember:  Do not eat or drink after midnight the night before your surgery    Take these medicines the morning of surgery with A SIP OF WATER  gabapentin (NEURONTIN)  pantoprazole (PROTONIX)  fluticasone (FLONASE)-if needed  DO NOT TAKE metFORMIN (GLUCOPHAGE-XR) THE DAY OF SURGERY  .** PLEASE check your blood sugar the morning of your surgery when you wake up and every 2 hours until you get to the Short Stay unit.  If your blood sugar is less than 70 mg/dL, you will need to treat for low blood sugar: Do not take insulin. Treat a low blood sugar (less than 70 mg/dL) with  cup of clear juice (cranberry or apple), 4 glucose tablets, OR glucose gel. Recheck blood sugar in 15 minutes after treatment (to make sure it is greater than 70 mg/dL). If your blood sugar is not greater than 70 mg/dL on recheck, call [phone] for further instructions.  As of today, STOP taking any Aspirin (unless otherwise instructed by your surgeon) Aleve, Naproxen, Ibuprofen, Motrin, Advil, Goody's, BC's, all herbal medications, fish oil, and all vitamins.                      Do not wear jewelry, make up, or nail polish            Do not wear lotions, powders, perfumes/colognes, or deodorant.            Do not shave 48 hours prior to surgery.  Men may shave face and neck.            Do not  bring valuables to the hospital.            [facility] is not responsible for any belongings or valuables.  Do NOT Smoke (Tobacco/Vaping) 24 hours prior to your procedure If you use a CPAP at night, you may bring all equipment for your overnight stay.   Contacts, glasses, dentures or bridgework may not be worn into surgery.      For patients admitted to the hospital, discharge time will be determined by your treatment team.   Patients discharged the day of surgery will not be allowed to drive home, and someone needs to stay with them for 24 hours.    Special instructions:   [place]- Preparing For Surgery  Before surgery, you can play an important role. Because skin is not sterile, your skin needs to be as free of germs as possible. You can reduce the number of germs on your skin by washing with CHG (chlorahexidine gluconate) Soap before surgery.  CHG is an antiseptic cleaner which kills germs and bonds with the skin to continue killing germs even after washing.    Oral Hygiene is also important to reduce your risk of infection.  Remember - BRUSH YOUR TEETH THE  MORNING OF SURGERY WITH YOUR REGULAR TOOTHPASTE  Please do not use if you have an allergy to CHG or antibacterial soaps. If your skin becomes reddened/irritated stop using the CHG.  Do not shave (including legs and underarms) for at least 48 hours prior to first CHG shower. It is OK to shave your face.  Please follow these instructions carefully.   Shower the NIGHT BEFORE SURGERY and the MORNING OF SURGERY with DIAL Soap.   Pat yourself dry with a CLEAN TOWEL.  Wear CLEAN PAJAMAS to bed the night before surgery  Place CLEAN SHEETS on your bed the night of your first shower and DO NOT SLEEP WITH PETS.   Day of Surgery: Please shower morning of surgery  Wear Clean/Comfortable clothing the morning of surgery Do not apply any deodorants/lotions.   Remember to brush your teeth WITH YOUR REGULAR TOOTHPASTE.   Questions  were answered. Patient verbalized understanding of instructions.

## 2022-01-21 NOTE — Progress Notes (Signed)
Anesthesia Chart Review: Same day workup  Myocardial infarction listed in patient history.  However, per review of available notes in epic, patient evidently went to the ED for evaluation of chest pain ~15 years ago but left prior to evaluation due to long wait time.  He has subsequently reported this as a potential "light heart attack".  There was no testing to corroborate this.  In 2016 he was referred by his PCP to cardiologist Dr. Mare Ferrari for evaluation of DOE.  Dr. Mare Ferrari ordered a nuclear stress test which was done 08/04/2014 and showed normal ejection fraction, normal wall motion, no scar or ischemia, low risk scan.  He was recommended to follow-up with cardiology as needed.  Patient was also evaluated by pulmonologist Dr. Olalere1/07/2021 for complaint of chronic DOE.  He had a thorough work-up that was essentially benign. Sleep study 12/07/2020 shows no significant obstructive sleep apnea. PFT 12/11/2020 shows no significant obstructive disease, no restriction, no significant bronchodilator response. CT scan of the chest 12/06/2020 shows no significant pathology in the lungs.  His symptoms were felt largely due to deconditioning.  Patient currently being followed by vascular surgery for history of nonhealing left second toe wound.   History of non-insulin-dependent DM 2 with severe peripheral neuropathy.  A1c 7.6 on 11/01/2021.  History of prostate cancer s/p radical prostatectomy at age 31.  Patient will need day of surgery labs and evaluation.  EKG 01/17/2022: NSR.  Rate 61.  Right bundle branch block.  Left anterior fascicular block.  No significant change from prior tracing on 05/06/2019.  Bifascicular block is been present since at least 2014.  CT chest 12/06/2020: IMPRESSION: No acute abnormality seen in the chest.   Mild coronary artery calcifications are noted.   Aortic Atherosclerosis (ICD10-I70.0).  Nuclear stress 08/04/2014: The ejection fraction is normal. Wall motion is  normal. This is a normal stress nuclear scan. There is no scar or ischemia. This is a low risk scan.     Wynonia Musty [facility]/Anesthesiology Phone [phone] 01/21/2022 9:46 AM

## 2022-01-21 NOTE — Anesthesia Preprocedure Evaluation (Addendum)
Anesthesia Evaluation  Patient identified by MRN, date of birth, ID band Patient awake    Reviewed: Allergy & Precautions, NPO status , Patient's Chart, lab work & pertinent test results  History of Anesthesia Complications Negative for: history of anesthetic complications  Airway Mallampati: II  TM Distance: >3 FB Neck ROM: Full    Dental  (+) Dental Advisory Given   Pulmonary sleep apnea    breath sounds clear to auscultation       Cardiovascular hypertension, Pt. on medications (-) angina + Past MI and + Peripheral Vascular Disease   Rhythm:Regular     Neuro/Psych negative neurological ROS  negative psych ROS   GI/Hepatic Neg liver ROS,GERD  Medicated and Controlled,,  Endo/Other  diabetes    Renal/GU negative Renal ROSLab Results      Component                Value               Date                      CREATININE               0.95                01/29/2022                Musculoskeletal  (+) Arthritis ,    Abdominal   Peds  Hematology negative hematology ROS (+) Lab Results      Component                Value               Date                      WBC                      11.4 (H)            01/29/2022                HGB                      14.1                01/29/2022                HCT                      40.8                01/29/2022                MCV                      88.1                01/29/2022                PLT                      216                 01/29/2022              Anesthesia Other Findings   Reproductive/Obstetrics  Anesthesia Physical Anesthesia Plan  ASA: 3  Anesthesia Plan: General   Post-op Pain Management: Toradol IV (intra-op)* and Ofirmev IV (intra-op)*   Induction: Intravenous  PONV Risk Score and Plan: 2 and Ondansetron and Dexamethasone  Airway Management Planned: Oral ETT  Additional Equipment:  None  Intra-op Plan:   Post-operative Plan: Extubation in OR  Informed Consent: I have reviewed the patients History and Physical, chart, labs and discussed the procedure including the risks, benefits and alternatives for the proposed anesthesia with the patient or authorized representative who has indicated his/her understanding and acceptance.     Dental advisory given  Plan Discussed with: CRNA  Anesthesia Plan Comments: (PAT note by Karoline Caldwell, PA-C: Myocardial infarction listed in patient history.  However, per review of available notes in epic, patient evidently went to the ED for evaluation of chest pain ~15 years ago but left prior to evaluation due to long wait time.  He has subsequently reported this as a potential "light heart attack".  There was no testing to corroborate this.  In 2016 he was referred by his PCP to cardiologist Dr. Mare Ferrari for evaluation of DOE.  Dr. Mare Ferrari ordered a nuclear stress test which was done 08/04/2014 and showed normal ejection fraction, normal wall motion, no scar or ischemia, low risk scan.  He was recommended to follow-up with cardiology as needed.  Patient was also evaluated by pulmonologist Dr. Olalere1/07/2021 for complaint of chronic DOE.  He had a thorough work-up that was essentially benign. Sleep study 12/07/2020 shows no significant obstructive sleep apnea. PFT 12/11/2020 shows no significant obstructive disease, no restriction, no significant bronchodilator response. CT scan of the chest 12/06/2020 shows no significant pathology in the lungs.  His symptoms were felt largely due to deconditioning.  Patient currently being followed by vascular surgery for history of nonhealing left second toe wound.   History of non-insulin-dependent DM 2 with severe peripheral neuropathy.  A1c 7.6 on 11/01/2021.  History of prostate cancer s/p radical prostatectomy at age 65.  Patient will need day of surgery labs and evaluation.  EKG 01/17/2022: NSR.  Rate  61.  Right bundle branch block.  Left anterior fascicular block.  No significant change from prior tracing on 05/06/2019.  Bifascicular block is been present since at least 2014.  CT chest 12/06/2020: IMPRESSION: No acute abnormality seen in the chest.  Mild coronary artery calcifications are noted.  Aortic Atherosclerosis (ICD10-I70.0).  Nuclear stress 08/04/2014: The ejection fraction is normal. Wall motion is normal. This is a normal stress nuclear scan. There is no scar or ischemia. This is a low risk scan.   )        Anesthesia Quick Evaluation

## 2022-01-22 ENCOUNTER — Encounter (INDEPENDENT_AMBULATORY_CARE_PROVIDER_SITE_OTHER): Payer: Medicare HMO | Admitting: Ophthalmology

## 2022-01-27 NOTE — Progress Notes (Signed)
Called patient. Went over pre-op instructions with the patient for new surgery date on 01/29/22. Pt denies any new medical history changes since Pre-op phone call done on 01/20/22.

## 2022-01-28 ENCOUNTER — Other Ambulatory Visit: Payer: Self-pay | Admitting: Nurse Practitioner

## 2022-01-28 DIAGNOSIS — K219 Gastro-esophageal reflux disease without esophagitis: Secondary | ICD-10-CM

## 2022-01-29 ENCOUNTER — Inpatient Hospital Stay (HOSPITAL_COMMUNITY): Payer: Medicare HMO | Admitting: Physician Assistant

## 2022-01-29 ENCOUNTER — Other Ambulatory Visit: Payer: Self-pay

## 2022-01-29 ENCOUNTER — Encounter (HOSPITAL_COMMUNITY)
Admission: RE | Disposition: A | Discharge status: Skilled Nursing Facility | Payer: Self-pay | Source: Home / Self Care | Attending: Vascular Surgery

## 2022-01-29 ENCOUNTER — Encounter (HOSPITAL_COMMUNITY): Payer: Self-pay | Admitting: Vascular Surgery

## 2022-01-29 ENCOUNTER — Inpatient Hospital Stay (HOSPITAL_COMMUNITY)
Admission: RE | Admit: 2022-01-29 | Discharge: 2022-02-11 | DRG: 253 | Disposition: A | Discharge status: Skilled Nursing Facility | Payer: Medicare HMO | Attending: Vascular Surgery | Admitting: Vascular Surgery

## 2022-01-29 DIAGNOSIS — E1152 Type 2 diabetes mellitus with diabetic peripheral angiopathy with gangrene: Secondary | ICD-10-CM | POA: Diagnosis not present

## 2022-01-29 DIAGNOSIS — Z806 Family history of leukemia: Secondary | ICD-10-CM

## 2022-01-29 DIAGNOSIS — Z885 Allergy status to narcotic agent status: Secondary | ICD-10-CM

## 2022-01-29 DIAGNOSIS — E1151 Type 2 diabetes mellitus with diabetic peripheral angiopathy without gangrene: Secondary | ICD-10-CM | POA: Diagnosis not present

## 2022-01-29 DIAGNOSIS — L03314 Cellulitis of groin: Secondary | ICD-10-CM | POA: Diagnosis not present

## 2022-01-29 DIAGNOSIS — I10 Essential (primary) hypertension: Secondary | ICD-10-CM

## 2022-01-29 DIAGNOSIS — Z8249 Family history of ischemic heart disease and other diseases of the circulatory system: Secondary | ICD-10-CM | POA: Diagnosis not present

## 2022-01-29 DIAGNOSIS — M869 Osteomyelitis, unspecified: Secondary | ICD-10-CM | POA: Diagnosis present

## 2022-01-29 DIAGNOSIS — Z801 Family history of malignant neoplasm of trachea, bronchus and lung: Secondary | ICD-10-CM | POA: Diagnosis not present

## 2022-01-29 DIAGNOSIS — I739 Peripheral vascular disease, unspecified: Principal | ICD-10-CM

## 2022-01-29 DIAGNOSIS — Z7984 Long term (current) use of oral hypoglycemic drugs: Secondary | ICD-10-CM | POA: Diagnosis not present

## 2022-01-29 DIAGNOSIS — M1990 Unspecified osteoarthritis, unspecified site: Secondary | ICD-10-CM | POA: Diagnosis not present

## 2022-01-29 DIAGNOSIS — I998 Other disorder of circulatory system: Secondary | ICD-10-CM | POA: Diagnosis not present

## 2022-01-29 DIAGNOSIS — I70221 Atherosclerosis of native arteries of extremities with rest pain, right leg: Secondary | ICD-10-CM | POA: Diagnosis not present

## 2022-01-29 DIAGNOSIS — M86172 Other acute osteomyelitis, left ankle and foot: Secondary | ICD-10-CM

## 2022-01-29 DIAGNOSIS — I96 Gangrene, not elsewhere classified: Secondary | ICD-10-CM | POA: Diagnosis not present

## 2022-01-29 DIAGNOSIS — I2510 Atherosclerotic heart disease of native coronary artery without angina pectoris: Secondary | ICD-10-CM | POA: Diagnosis present

## 2022-01-29 DIAGNOSIS — Z79899 Other long term (current) drug therapy: Secondary | ICD-10-CM | POA: Diagnosis not present

## 2022-01-29 DIAGNOSIS — I70222 Atherosclerosis of native arteries of extremities with rest pain, left leg: Secondary | ICD-10-CM

## 2022-01-29 DIAGNOSIS — M159 Polyosteoarthritis, unspecified: Secondary | ICD-10-CM | POA: Diagnosis present

## 2022-01-29 DIAGNOSIS — L97529 Non-pressure chronic ulcer of other part of left foot with unspecified severity: Secondary | ICD-10-CM | POA: Diagnosis not present

## 2022-01-29 DIAGNOSIS — Z9889 Other specified postprocedural states: Secondary | ICD-10-CM | POA: Diagnosis not present

## 2022-01-29 DIAGNOSIS — I452 Bifascicular block: Secondary | ICD-10-CM | POA: Diagnosis present

## 2022-01-29 DIAGNOSIS — Z791 Long term (current) use of non-steroidal anti-inflammatories (NSAID): Secondary | ICD-10-CM

## 2022-01-29 DIAGNOSIS — K219 Gastro-esophageal reflux disease without esophagitis: Secondary | ICD-10-CM | POA: Diagnosis present

## 2022-01-29 DIAGNOSIS — Z8546 Personal history of malignant neoplasm of prostate: Secondary | ICD-10-CM | POA: Diagnosis not present

## 2022-01-29 DIAGNOSIS — Z833 Family history of diabetes mellitus: Secondary | ICD-10-CM | POA: Diagnosis not present

## 2022-01-29 DIAGNOSIS — E785 Hyperlipidemia, unspecified: Secondary | ICD-10-CM | POA: Diagnosis present

## 2022-01-29 DIAGNOSIS — Z89422 Acquired absence of other left toe(s): Secondary | ICD-10-CM | POA: Diagnosis not present

## 2022-01-29 DIAGNOSIS — E119 Type 2 diabetes mellitus without complications: Secondary | ICD-10-CM | POA: Diagnosis not present

## 2022-01-29 DIAGNOSIS — Z888 Allergy status to other drugs, medicaments and biological substances status: Secondary | ICD-10-CM

## 2022-01-29 DIAGNOSIS — Z800 Family history of malignant neoplasm of digestive organs: Secondary | ICD-10-CM

## 2022-01-29 DIAGNOSIS — E1169 Type 2 diabetes mellitus with other specified complication: Secondary | ICD-10-CM | POA: Diagnosis present

## 2022-01-29 DIAGNOSIS — I252 Old myocardial infarction: Secondary | ICD-10-CM | POA: Diagnosis not present

## 2022-01-29 DIAGNOSIS — G4730 Sleep apnea, unspecified: Secondary | ICD-10-CM | POA: Diagnosis not present

## 2022-01-29 DIAGNOSIS — Z7982 Long term (current) use of aspirin: Secondary | ICD-10-CM | POA: Diagnosis not present

## 2022-01-29 DIAGNOSIS — I70262 Atherosclerosis of native arteries of extremities with gangrene, left leg: Secondary | ICD-10-CM | POA: Diagnosis present

## 2022-01-29 DIAGNOSIS — E11621 Type 2 diabetes mellitus with foot ulcer: Secondary | ICD-10-CM | POA: Diagnosis present

## 2022-01-29 DIAGNOSIS — S98132A Complete traumatic amputation of one left lesser toe, initial encounter: Secondary | ICD-10-CM | POA: Diagnosis not present

## 2022-01-29 HISTORY — PX: VEIN HARVEST: SHX6363

## 2022-01-29 HISTORY — PX: FEMORAL-TIBIAL BYPASS GRAFT: SHX938

## 2022-01-29 LAB — CBC
HCT: 40.8 % (ref 39.0–52.0)
Hemoglobin: 14.1 g/dL (ref 13.0–17.0)
MCH: 30.5 pg (ref 26.0–34.0)
MCHC: 34.6 g/dL (ref 30.0–36.0)
MCV: 88.1 fL (ref 80.0–100.0)
Platelets: 216 10*3/uL (ref 150–400)
RBC: 4.63 MIL/uL (ref 4.22–5.81)
RDW: 11.8 % (ref 11.5–15.5)
WBC: 11.4 10*3/uL — ABNORMAL HIGH (ref 4.0–10.5)
nRBC: 0 % (ref 0.0–0.2)

## 2022-01-29 LAB — COMPREHENSIVE METABOLIC PANEL
ALT: 16 U/L (ref 0–44)
AST: 16 U/L (ref 15–41)
Albumin: 3.8 g/dL (ref 3.5–5.0)
Alkaline Phosphatase: 75 U/L (ref 38–126)
Anion gap: 10 (ref 5–15)
BUN: 13 mg/dL (ref 8–23)
CO2: 24 mmol/L (ref 22–32)
Calcium: 9 mg/dL (ref 8.9–10.3)
Chloride: 100 mmol/L (ref 98–111)
Creatinine, Ser: 0.95 mg/dL (ref 0.61–1.24)
GFR, Estimated: >60 mL/min (ref >60)
Glucose, Bld: 118 mg/dL — ABNORMAL HIGH (ref 70–99)
Potassium: 3.7 mmol/L (ref 3.5–5.1)
Sodium: 134 mmol/L — ABNORMAL LOW (ref 135–145)
Total Bilirubin: 1.4 mg/dL — ABNORMAL HIGH (ref 0.3–1.2)
Total Protein: 7 g/dL (ref 6.5–8.1)

## 2022-01-29 LAB — URINALYSIS, ROUTINE W REFLEX MICROSCOPIC
Bacteria, UA: NONE SEEN
Bilirubin Urine: NEGATIVE
Glucose, UA: NEGATIVE mg/dL
Hgb urine dipstick: NEGATIVE
Ketones, ur: 20 mg/dL — AB
Leukocytes,Ua: NEGATIVE
Nitrite: NEGATIVE
Protein, ur: 30 mg/dL — AB
Specific Gravity, Urine: 1.026 (ref 1.005–1.030)
pH: 5 (ref 5.0–8.0)

## 2022-01-29 LAB — GLUCOSE, CAPILLARY
Glucose-Capillary: 106 mg/dL — ABNORMAL HIGH (ref 70–99)
Glucose-Capillary: 108 mg/dL — ABNORMAL HIGH (ref 70–99)
Glucose-Capillary: 120 mg/dL — ABNORMAL HIGH (ref 70–99)
Glucose-Capillary: 27 mg/dL — CL (ref 70–99)
Glucose-Capillary: 129 mg/dL — ABNORMAL HIGH (ref 70–99)
Glucose-Capillary: 133 mg/dL — ABNORMAL HIGH (ref 70–99)
Glucose-Capillary: 146 mg/dL — ABNORMAL HIGH (ref 70–99)

## 2022-01-29 LAB — ABO/RH: ABO/RH(D): O POS

## 2022-01-29 LAB — APTT: aPTT: 29 seconds (ref 24–36)

## 2022-01-29 LAB — PROTIME-INR
INR: 1 (ref 0.8–1.2)
Prothrombin Time: 13.2 seconds (ref 11.4–15.2)

## 2022-01-29 LAB — TYPE AND SCREEN
ABO/RH(D): O POS
Antibody Screen: NEGATIVE

## 2022-01-29 LAB — POCT ACTIVATED CLOTTING TIME
Activated Clotting Time: 228 seconds
Activated Clotting Time: 260 seconds

## 2022-01-29 SURGERY — CREATION, BYPASS, ARTERIAL, FEMORAL TO TIBIAL, USING GRAFT
Anesthesia: General | Site: Leg Lower | Laterality: Left

## 2022-01-29 MED ORDER — PROPOFOL 10 MG/ML IV BOLUS
INTRAVENOUS | Status: AC
  Filled 2022-01-29: qty 20

## 2022-01-29 MED ORDER — PHENYLEPHRINE HCL-NACL 20-0.9 MG/250ML-% IV SOLN
INTRAVENOUS | Status: DC | PRN
  Administered 2022-01-29: 50 ug/min via INTRAVENOUS

## 2022-01-29 MED ORDER — GUAIFENESIN-DM 100-10 MG/5ML PO SYRP: 15.0000 mL | ORAL_SOLUTION | ORAL | Status: DC | PRN

## 2022-01-29 MED ORDER — HYDROMORPHONE HCL 1 MG/ML IJ SOLN
INTRAMUSCULAR | Status: AC
  Filled 2022-01-29: qty 0.5

## 2022-01-29 MED ORDER — LABETALOL HCL 5 MG/ML IV SOLN: 10.0000 mg | INTRAVENOUS | Status: DC | PRN

## 2022-01-29 MED ORDER — STERILE WATER FOR IRRIGATION IR SOLN
Status: DC | PRN
  Administered 2022-01-29: 1000 mL

## 2022-01-29 MED ORDER — CHLORHEXIDINE GLUCONATE CLOTH 2 % EX PADS: 6.0000 | MEDICATED_PAD | Freq: Once | CUTANEOUS | Status: DC

## 2022-01-29 MED ORDER — HEPARIN SODIUM (PORCINE) 1000 UNIT/ML IJ SOLN
INTRAMUSCULAR | Status: DC | PRN
  Administered 2022-01-29: 10000 [IU] via INTRAVENOUS
  Administered 2022-01-29: 2000 [IU] via INTRAVENOUS

## 2022-01-29 MED ORDER — OXYCODONE HCL 5 MG PO TABS: 5.0000 mg | ORAL_TABLET | Freq: Once | ORAL | Status: DC | PRN

## 2022-01-29 MED ORDER — PROTAMINE SULFATE 10 MG/ML IV SOLN
INTRAVENOUS | Status: DC | PRN
  Administered 2022-01-29 (×5): 10 mg via INTRAVENOUS

## 2022-01-29 MED ORDER — PHENYLEPHRINE 80 MCG/ML (10ML) SYRINGE FOR IV PUSH (FOR BLOOD PRESSURE SUPPORT)
PREFILLED_SYRINGE | INTRAVENOUS | Status: DC | PRN
  Administered 2022-01-29: 20 ug via INTRAVENOUS

## 2022-01-29 MED ORDER — HYDRALAZINE HCL 20 MG/ML IJ SOLN: 5.0000 mg | INTRAMUSCULAR | Status: DC | PRN

## 2022-01-29 MED ORDER — LISINOPRIL 5 MG PO TABS
5.0000 mg | ORAL_TABLET | Freq: Every day | ORAL | Status: DC
  Administered 2022-01-29 – 2022-02-11 (×13): 5 mg via ORAL
  Filled 2022-01-29 (×13): qty 1

## 2022-01-29 MED ORDER — HEMOSTATIC AGENTS (NO CHARGE) OPTIME
TOPICAL | Status: DC | PRN
  Administered 2022-01-29: 1 via TOPICAL

## 2022-01-29 MED ORDER — GABAPENTIN 300 MG PO CAPS
900.0000 mg | ORAL_CAPSULE | Freq: Two times a day (BID) | ORAL | Status: DC
  Administered 2022-01-29 – 2022-02-11 (×26): 900 mg via ORAL
  Filled 2022-01-29 (×26): qty 3

## 2022-01-29 MED ORDER — OXYCODONE HCL 5 MG PO TABS
5.0000 mg | ORAL_TABLET | ORAL | Status: DC | PRN
  Administered 2022-01-29 – 2022-01-31 (×7): 10 mg via ORAL
  Administered 2022-01-31: 5 mg via ORAL
  Administered 2022-02-01 – 2022-02-04 (×11): 10 mg via ORAL
  Administered 2022-02-05: 5 mg via ORAL
  Administered 2022-02-05 – 2022-02-09 (×5): 10 mg via ORAL
  Filled 2022-01-29 (×8): qty 2
  Filled 2022-01-29: qty 1
  Filled 2022-01-29 (×18): qty 2

## 2022-01-29 MED ORDER — INSULIN ASPART 100 UNIT/ML IJ SOLN: 0.0000 [IU] | INTRAMUSCULAR | Status: DC | PRN

## 2022-01-29 MED ORDER — LIDOCAINE 2% (20 MG/ML) 5 ML SYRINGE
INTRAMUSCULAR | Status: DC | PRN
  Administered 2022-01-29: 80 mg via INTRAVENOUS

## 2022-01-29 MED ORDER — MAGNESIUM SULFATE 2 GM/50ML IV SOLN: 2.0000 g | Freq: Every day | INTRAVENOUS | Status: DC | PRN

## 2022-01-29 MED ORDER — ALUM & MAG HYDROXIDE-SIMETH 200-200-20 MG/5ML PO SUSP
15.0000 mL | ORAL | Status: DC | PRN
  Administered 2022-02-05: 30 mL via ORAL
  Filled 2022-01-29: qty 30

## 2022-01-29 MED ORDER — FENTANYL CITRATE (PF) 100 MCG/2ML IJ SOLN
25.0000 ug | INTRAMUSCULAR | Status: DC | PRN
  Administered 2022-01-29 (×2): 50 ug via INTRAVENOUS

## 2022-01-29 MED ORDER — POLYETHYLENE GLYCOL 3350 17 G PO PACK: 17.0000 g | PACK | Freq: Every day | ORAL | Status: DC | PRN

## 2022-01-29 MED ORDER — OXYCODONE HCL 5 MG/5ML PO SOLN: 5.0000 mg | Freq: Once | ORAL | Status: DC | PRN

## 2022-01-29 MED ORDER — ACETAMINOPHEN 10 MG/ML IV SOLN
INTRAVENOUS | Status: DC | PRN
  Administered 2022-01-29: 1000 mg via INTRAVENOUS

## 2022-01-29 MED ORDER — ACETAMINOPHEN 650 MG RE SUPP: 325.0000 mg | RECTAL | Status: DC | PRN

## 2022-01-29 MED ORDER — PHENOL 1.4 % MT LIQD: 1.0000 | OROMUCOSAL | Status: DC | PRN

## 2022-01-29 MED ORDER — ONDANSETRON HCL 4 MG/2ML IJ SOLN
4.0000 mg | Freq: Four times a day (QID) | INTRAMUSCULAR | Status: DC | PRN
  Administered 2022-02-01: 4 mg via INTRAVENOUS
  Filled 2022-01-29: qty 2

## 2022-01-29 MED ORDER — CHLORHEXIDINE GLUCONATE 0.12 % MT SOLN
15.0000 mL | Freq: Once | OROMUCOSAL | Status: AC
  Administered 2022-01-29: 15 mL via OROMUCOSAL
  Filled 2022-01-29: qty 15

## 2022-01-29 MED ORDER — PANTOPRAZOLE SODIUM 40 MG PO TBEC
40.0000 mg | DELAYED_RELEASE_TABLET | Freq: Every day | ORAL | Status: DC
  Administered 2022-01-29 – 2022-02-11 (×13): 40 mg via ORAL
  Filled 2022-01-29 (×13): qty 1

## 2022-01-29 MED ORDER — LACTATED RINGERS IV SOLN: INTRAVENOUS | Status: DC

## 2022-01-29 MED ORDER — HEPARIN 6000 UNIT IRRIGATION SOLUTION
Status: DC | PRN
  Administered 2022-01-29: 1

## 2022-01-29 MED ORDER — HEPARIN SODIUM (PORCINE) 5000 UNIT/ML IJ SOLN
5000.0000 [IU] | Freq: Three times a day (TID) | INTRAMUSCULAR | Status: DC
  Administered 2022-01-30 – 2022-02-11 (×36): 5000 [IU] via SUBCUTANEOUS
  Filled 2022-01-29 (×34): qty 1

## 2022-01-29 MED ORDER — MORPHINE SULFATE (PF) 2 MG/ML IV SOLN
2.0000 mg | INTRAVENOUS | Status: DC | PRN
  Administered 2022-01-29 – 2022-02-02 (×4): 2 mg via INTRAVENOUS
  Filled 2022-01-29 (×4): qty 1

## 2022-01-29 MED ORDER — 0.9 % SODIUM CHLORIDE (POUR BTL) OPTIME
TOPICAL | Status: DC | PRN
  Administered 2022-01-29: 2000 mL

## 2022-01-29 MED ORDER — METOPROLOL TARTRATE 5 MG/5ML IV SOLN: 2.0000 mg | INTRAVENOUS | Status: DC | PRN

## 2022-01-29 MED ORDER — ORAL CARE MOUTH RINSE: 15.0000 mL | Freq: Once | OROMUCOSAL | Status: AC

## 2022-01-29 MED ORDER — ACETAMINOPHEN 160 MG/5ML PO SOLN: 1000.0000 mg | Freq: Once | ORAL | Status: DC | PRN

## 2022-01-29 MED ORDER — FLUTICASONE PROPIONATE 50 MCG/ACT NA SUSP: 1.0000 | Freq: Every day | NASAL | Status: DC | PRN

## 2022-01-29 MED ORDER — ASPIRIN 81 MG PO TBEC
81.0000 mg | DELAYED_RELEASE_TABLET | Freq: Every day | ORAL | Status: DC
  Administered 2022-01-29 – 2022-02-11 (×13): 81 mg via ORAL
  Filled 2022-01-29 (×13): qty 1

## 2022-01-29 MED ORDER — PROPOFOL 500 MG/50ML IV EMUL
INTRAVENOUS | Status: DC | PRN
  Administered 2022-01-29: 25 ug/kg/min via INTRAVENOUS

## 2022-01-29 MED ORDER — ROCURONIUM BROMIDE 10 MG/ML (PF) SYRINGE
PREFILLED_SYRINGE | INTRAVENOUS | Status: DC | PRN
  Administered 2022-01-29: 20 mg via INTRAVENOUS
  Administered 2022-01-29: 80 mg via INTRAVENOUS
  Administered 2022-01-29: 20 mg via INTRAVENOUS

## 2022-01-29 MED ORDER — ACETAMINOPHEN 10 MG/ML IV SOLN
INTRAVENOUS | Status: AC
  Filled 2022-01-29: qty 100

## 2022-01-29 MED ORDER — HYDROMORPHONE HCL 1 MG/ML IJ SOLN
INTRAMUSCULAR | Status: DC | PRN
  Administered 2022-01-29: .5 mg via INTRAVENOUS

## 2022-01-29 MED ORDER — SODIUM CHLORIDE 0.9 % IV SOLN: INTRAVENOUS | Status: DC

## 2022-01-29 MED ORDER — BISACODYL 10 MG RE SUPP: 10.0000 mg | Freq: Every day | RECTAL | Status: DC | PRN

## 2022-01-29 MED ORDER — CEFAZOLIN SODIUM-DEXTROSE 2-4 GM/100ML-% IV SOLN
2.0000 g | Freq: Three times a day (TID) | INTRAVENOUS | Status: AC
  Administered 2022-01-29 – 2022-01-30 (×2): 2 g via INTRAVENOUS
  Filled 2022-01-29 (×2): qty 100

## 2022-01-29 MED ORDER — FENTANYL CITRATE (PF) 250 MCG/5ML IJ SOLN
INTRAMUSCULAR | Status: DC | PRN
  Administered 2022-01-29: 100 ug via INTRAVENOUS
  Administered 2022-01-29 (×3): 50 ug via INTRAVENOUS

## 2022-01-29 MED ORDER — SUGAMMADEX SODIUM 200 MG/2ML IV SOLN
INTRAVENOUS | Status: DC | PRN
  Administered 2022-01-29: 200 mg via INTRAVENOUS

## 2022-01-29 MED ORDER — PAPAVERINE HCL 30 MG/ML IJ SOLN
INTRAMUSCULAR | Status: AC
  Filled 2022-01-29: qty 2

## 2022-01-29 MED ORDER — CEFAZOLIN SODIUM-DEXTROSE 2-4 GM/100ML-% IV SOLN
2.0000 g | INTRAVENOUS | Status: AC
  Administered 2022-01-29: 2 g via INTRAVENOUS
  Administered 2022-01-29: 3 g via INTRAVENOUS
  Filled 2022-01-29: qty 100

## 2022-01-29 MED ORDER — FENTANYL CITRATE (PF) 250 MCG/5ML IJ SOLN
INTRAMUSCULAR | Status: AC
  Filled 2022-01-29: qty 5

## 2022-01-29 MED ORDER — PROPOFOL 10 MG/ML IV BOLUS
INTRAVENOUS | Status: DC | PRN
  Administered 2022-01-29 (×2): 10 mg via INTRAVENOUS
  Administered 2022-01-29: 160 mg via INTRAVENOUS

## 2022-01-29 MED ORDER — ONDANSETRON HCL 4 MG/2ML IJ SOLN
INTRAMUSCULAR | Status: DC | PRN
  Administered 2022-01-29: 4 mg via INTRAVENOUS

## 2022-01-29 MED ORDER — METFORMIN HCL ER 500 MG PO TB24
1000.0000 mg | ORAL_TABLET | Freq: Two times a day (BID) | ORAL | Status: DC
  Administered 2022-01-30 – 2022-02-11 (×21): 1000 mg via ORAL
  Filled 2022-01-29 (×21): qty 2

## 2022-01-29 MED ORDER — HEPARIN 6000 UNIT IRRIGATION SOLUTION
Status: AC
  Filled 2022-01-29: qty 500

## 2022-01-29 MED ORDER — AMISULPRIDE (ANTIEMETIC) 5 MG/2ML IV SOLN
INTRAVENOUS | Status: AC
  Filled 2022-01-29: qty 4

## 2022-01-29 MED ORDER — AMISULPRIDE (ANTIEMETIC) 5 MG/2ML IV SOLN
10.0000 mg | Freq: Once | INTRAVENOUS | Status: AC
  Administered 2022-01-29: 10 mg via INTRAVENOUS

## 2022-01-29 MED ORDER — ROSUVASTATIN CALCIUM 5 MG PO TABS
10.0000 mg | ORAL_TABLET | Freq: Every day | ORAL | Status: DC
  Administered 2022-01-29 – 2022-01-30 (×2): 10 mg via ORAL
  Filled 2022-01-29 (×2): qty 2

## 2022-01-29 MED ORDER — FENTANYL CITRATE (PF) 100 MCG/2ML IJ SOLN
INTRAMUSCULAR | Status: AC
  Filled 2022-01-29: qty 2

## 2022-01-29 MED ORDER — ACETAMINOPHEN 10 MG/ML IV SOLN: 1000.0000 mg | Freq: Once | INTRAVENOUS | Status: DC | PRN

## 2022-01-29 MED ORDER — INSULIN ASPART 100 UNIT/ML IJ SOLN
0.0000 [IU] | Freq: Three times a day (TID) | INTRAMUSCULAR | Status: DC
  Administered 2022-01-30: 3 [IU] via SUBCUTANEOUS
  Administered 2022-01-31: 2 [IU] via SUBCUTANEOUS
  Administered 2022-01-31 (×2): 3 [IU] via SUBCUTANEOUS
  Administered 2022-02-01: 2 [IU] via SUBCUTANEOUS
  Administered 2022-02-01: 3 [IU] via SUBCUTANEOUS
  Administered 2022-02-02: 2 [IU] via SUBCUTANEOUS
  Administered 2022-02-02: 3 [IU] via SUBCUTANEOUS
  Administered 2022-02-02: 2 [IU] via SUBCUTANEOUS
  Administered 2022-02-03: 3 [IU] via SUBCUTANEOUS
  Administered 2022-02-03: 5 [IU] via SUBCUTANEOUS
  Administered 2022-02-03: 2 [IU] via SUBCUTANEOUS
  Administered 2022-02-04: 3 [IU] via SUBCUTANEOUS
  Administered 2022-02-04 – 2022-02-05 (×2): 2 [IU] via SUBCUTANEOUS
  Administered 2022-02-05: 3 [IU] via SUBCUTANEOUS
  Administered 2022-02-05: 5 [IU] via SUBCUTANEOUS
  Administered 2022-02-06 – 2022-02-07 (×3): 2 [IU] via SUBCUTANEOUS
  Administered 2022-02-07 – 2022-02-09 (×3): 3 [IU] via SUBCUTANEOUS
  Administered 2022-02-10: 2 [IU] via SUBCUTANEOUS

## 2022-01-29 MED ORDER — POTASSIUM CHLORIDE CRYS ER 20 MEQ PO TBCR: 20.0000 meq | EXTENDED_RELEASE_TABLET | Freq: Every day | ORAL | Status: DC | PRN

## 2022-01-29 MED ORDER — ACETAMINOPHEN 500 MG PO TABS: 1000.0000 mg | ORAL_TABLET | Freq: Once | ORAL | Status: DC | PRN

## 2022-01-29 MED ORDER — DOCUSATE SODIUM 100 MG PO CAPS
100.0000 mg | ORAL_CAPSULE | Freq: Every day | ORAL | Status: DC
  Administered 2022-01-30 – 2022-02-10 (×11): 100 mg via ORAL
  Filled 2022-01-29 (×11): qty 1

## 2022-01-29 MED ORDER — ACETAMINOPHEN 325 MG PO TABS
325.0000 mg | ORAL_TABLET | ORAL | Status: DC | PRN
  Administered 2022-01-30 – 2022-02-09 (×13): 650 mg via ORAL
  Filled 2022-01-29 (×13): qty 2

## 2022-01-29 MED ORDER — ESMOLOL HCL 100 MG/10ML IV SOLN
INTRAVENOUS | Status: DC | PRN
  Administered 2022-01-29: 30 ug via INTRAVENOUS

## 2022-01-29 MED ORDER — SODIUM CHLORIDE 0.9 % IV SOLN: 500.0000 mL | Freq: Once | INTRAVENOUS | Status: DC | PRN

## 2022-01-29 MED ORDER — ONDANSETRON HCL 4 MG/2ML IJ SOLN: INTRAMUSCULAR | Status: DC | PRN

## 2022-01-29 MED ORDER — DEXMEDETOMIDINE HCL IN NACL 80 MCG/20ML IV SOLN
INTRAVENOUS | Status: DC | PRN
  Administered 2022-01-29: 8 ug via BUCCAL

## 2022-01-29 SURGICAL SUPPLY — 58 items
BAG COUNTER SPONGE SURGICOUNT (BAG) ×1 IMPLANT
BANDAGE ESMARK 6X9 LF (GAUZE/BANDAGES/DRESSINGS) IMPLANT
BLADE CLIPPER SURG (BLADE) ×1 IMPLANT
BNDG ESMARK 6X9 LF (GAUZE/BANDAGES/DRESSINGS)
CANISTER SUCT 3000ML PPV (MISCELLANEOUS) ×1 IMPLANT
CANNULA VESSEL 3MM 2 BLNT TIP (CANNULA) IMPLANT
CLIP TI WIDE RED SMALL 24 (CLIP) IMPLANT
CLIP VESOCCLUDE MED 24/CT (CLIP) ×1 IMPLANT
CLIP VESOCCLUDE SM WIDE 24/CT (CLIP) ×1 IMPLANT
COVER PROBE W GEL 5X96 (DRAPES) ×1 IMPLANT
CUFF TOURN SGL QUICK 18X4 (TOURNIQUET CUFF) IMPLANT
CUFF TOURN SGL QUICK 24 (TOURNIQUET CUFF)
CUFF TOURN SGL QUICK 34 (TOURNIQUET CUFF)
CUFF TOURN SGL QUICK 42 (TOURNIQUET CUFF) IMPLANT
CUFF TRNQT CYL 24X4X16.5-23 (TOURNIQUET CUFF) IMPLANT
CUFF TRNQT CYL 34X4.125X (TOURNIQUET CUFF) IMPLANT
DERMABOND ADVANCED .7 DNX12 (GAUZE/BANDAGES/DRESSINGS) ×1 IMPLANT
DRAIN CHANNEL 15F RND FF W/TCR (WOUND CARE) IMPLANT
DRAPE C-ARM 42X72 X-RAY (DRAPES) ×1 IMPLANT
DRAPE HALF SHEET 40X57 (DRAPES) IMPLANT
DRSG COVADERM 4X8 (GAUZE/BANDAGES/DRESSINGS) IMPLANT
ELECT REM PT RETURN 9FT ADLT (ELECTROSURGICAL) ×1
ELECTRODE REM PT RTRN 9FT ADLT (ELECTROSURGICAL) ×1 IMPLANT
EVACUATOR SILICONE 100CC (DRAIN) IMPLANT
GLOVE BIO SURGEON STRL SZ7.5 (GLOVE) ×1 IMPLANT
GLOVE BIOGEL PI IND STRL 8 (GLOVE) ×1 IMPLANT
GOWN STRL REUS W/ TWL LRG LVL3 (GOWN DISPOSABLE) ×2 IMPLANT
GOWN STRL REUS W/ TWL XL LVL3 (GOWN DISPOSABLE) ×2 IMPLANT
GOWN STRL REUS W/TWL LRG LVL3 (GOWN DISPOSABLE) ×2
GOWN STRL REUS W/TWL XL LVL3 (GOWN DISPOSABLE) ×2
HEMOSTAT SPONGE AVITENE ULTRA (HEMOSTASIS) IMPLANT
KIT BASIN OR (CUSTOM PROCEDURE TRAY) ×1 IMPLANT
KIT TURNOVER KIT B (KITS) ×1 IMPLANT
LOOP VASCULAR MINI 18 RED (MISCELLANEOUS) ×1
NS IRRIG 1000ML POUR BTL (IV SOLUTION) ×2 IMPLANT
PACK PERIPHERAL VASCULAR (CUSTOM PROCEDURE TRAY) ×1 IMPLANT
PAD ARMBOARD 7.5X6 YLW CONV (MISCELLANEOUS) ×2 IMPLANT
PUNCH AORTIC ROTATE 4.0MM (MISCELLANEOUS) IMPLANT
STOPCOCK 4 WAY LG BORE MALE ST (IV SETS) ×1 IMPLANT
SUT ETHILON 3 0 PS 1 (SUTURE) IMPLANT
SUT MNCRL AB 4-0 PS2 18 (SUTURE) ×2 IMPLANT
SUT PROLENE 5 0 C 1 24 (SUTURE) ×1 IMPLANT
SUT PROLENE 6 0 BV (SUTURE) IMPLANT
SUT PROLENE 7 0 BV 1 (SUTURE) IMPLANT
SUT SILK 2 0 PERMA HAND 18 BK (SUTURE) IMPLANT
SUT SILK 2 0 SH (SUTURE) ×1 IMPLANT
SUT SILK 3 0 (SUTURE) ×5
SUT SILK 3-0 18XBRD TIE 12 (SUTURE) IMPLANT
SUT VIC AB 2-0 CT1 27 (SUTURE) ×2
SUT VIC AB 2-0 CT1 TAPERPNT 27 (SUTURE) ×2 IMPLANT
SUT VIC AB 3-0 SH 27 (SUTURE) ×6
SUT VIC AB 3-0 SH 27X BRD (SUTURE) ×2 IMPLANT
TOWEL GREEN STERILE (TOWEL DISPOSABLE) ×1 IMPLANT
TRAY FOLEY MTR SLVR 16FR STAT (SET/KITS/TRAYS/PACK) ×1 IMPLANT
TUBING EXTENTION W/L.L. (IV SETS) ×1 IMPLANT
UNDERPAD 30X36 HEAVY ABSORB (UNDERPADS AND DIAPERS) ×1 IMPLANT
VASCULAR TIE MINI RED 18IN STL (MISCELLANEOUS) IMPLANT
WATER STERILE IRR 1000ML POUR (IV SOLUTION) ×1 IMPLANT

## 2022-01-29 NOTE — Discharge Instructions (Addendum)
 [facility]  Discharge instructions  Lower Extremity Bypass Surgery  Please refer to the following instruction for your post-procedure care. Your surgeon or physician assistant will discuss any changes with you.  Activity  You are encouraged to walk as much as you can. You can slowly return to normal activities during the month after your surgery. Avoid strenuous activity and heavy lifting until your doctor tells you it's OK. Avoid activities such as vacuuming or swinging a golf club. Do not drive until your doctor give the OK and you are no longer taking prescription pain medications. It is also normal to have difficulty with sleep habits, eating and bowel movement after surgery. These will go away with time.  Bathing/Showering  Shower daily after you go home. Do not soak in a bathtub, hot tub, or swim until the incision heals completely.  Incision Care  Clean your incision with mild soap and water. Shower every day. Pat the area dry with a clean towel. You do not need a bandage unless otherwise instructed. Do not apply any ointments or creams to your incision. If you have open wounds you will be instructed how to care for them or a visiting nurse may be arranged for you. If you have staples or sutures along your incision they will be removed at your post-op appointment. You may have skin glue on your incision. Do not peel it off. It will come off on its own in about one week.  Wash the groin wound with soap and water daily and pat dry. (No tub bath-only shower)  Then put a dry gauze or washcloth in the groin to keep this area dry to help prevent wound infection.  Do this daily and as needed.  Do not use Vaseline or neosporin on your incisions.  Only use soap and water on your incisions and then protect and keep dry.  Diet  Resume your normal diet. There are no special food restrictions following this procedure. A low fat/ low cholesterol diet is  recommended for all patients with vascular disease. In order to heal from your surgery, it is CRITICAL to get adequate nutrition. Your body requires vitamins, minerals, and protein. Vegetables are the best source of vitamins and minerals. Vegetables also provide the perfect balance of protein. Processed food has little nutritional value, so try to avoid this.  Medications  Resume taking all your medications unless your doctor or physician assistant tells you not to. If your incision is causing pain, you may take over-the-counter pain relievers such as acetaminophen (Tylenol). If you were prescribed a stronger pain medication, please aware these medication can cause nausea and constipation. Prevent nausea by taking the medication with a snack or meal. Avoid constipation by drinking plenty of fluids and eating foods with high amount of fiber, such as fruits, vegetables, and grains. Take Colace 100 mg (an over-the-counter stool softener) twice a day as needed for constipation.  Do not take Tylenol if you are taking prescription pain medications.  Follow Up  Our office will schedule a follow up appointment 2-3 weeks following discharge.  Please call us immediately for any of the following conditions  Severe or worsening pain in your legs or feet while at rest or while walking Increase pain, redness, warmth, or drainage (pus) from your incision site(s) Fever of 101 degree or higher The swelling in your leg with the bypass suddenly worsens and becomes more painful than when you were in the hospital If you have   been instructed to feel your graft pulse then you should do so every day. If you can no longer feel this pulse, call the office immediately. Not all patients are given this instruction.  Leg swelling is common after leg bypass surgery.  The swelling should improve over a few months following surgery. To improve the swelling, you may elevate your legs above the level of your heart while you are  sitting or resting. Your surgeon or physician assistant may ask you to apply an ACE wrap or wear compression (TED) stockings to help to reduce swelling.  Reduce your risk of vascular disease  Stop smoking. If you would like help call [facility] at 1-800-QUIT-NOW 818-558-6587) or [place] at [phone].  Manage your cholesterol Maintain a desired weight Control your diabetes weight Control your diabetes Keep your blood pressure down  If you have any questions, please call the office at [phone]   ---  LEFT FOOT DRESSING: Change left foot dressing every other day.  Can apply a small amount of Betadine to the incision followed by Xeroform and a dry sterile dressing.  Make sure to pad all bony prominences.  If you notice any increased swelling, redness, drainage or any signs of infection please contact [facility] at [phone]

## 2022-01-29 NOTE — Anesthesia Procedure Notes (Signed)
Procedure Name: Intubation Date/Time: 01/29/2022 11:18 AM  Performed by: Georgia Duff, CRNAPre-anesthesia Checklist: Patient identified, Emergency Drugs available, Suction available and Patient being monitored Patient Re-evaluated:Patient Re-evaluated prior to induction Oxygen Delivery Method: Circle System Utilized Preoxygenation: Pre-oxygenation with 100% oxygen Induction Type: IV induction Ventilation: Mask ventilation without difficulty Laryngoscope Size: Miller and 2 Grade View: Grade I Tube type: Oral Tube size: 7.5 mm Number of attempts: 1 Airway Equipment and Method: Stylet and Oral airway Placement Confirmation: ETT inserted through vocal cords under direct vision, positive ETCO2 and breath sounds checked- equal and bilateral Secured at: 21 cm Tube secured with: Tape Dental Injury: Teeth and Oropharynx as per pre-operative assessment

## 2022-01-29 NOTE — Op Note (Addendum)
Date: January 29, 2022  Preoperative diagnosis: Critical limb ischemia of the left lower extremity with tissue loss  Postoperative diagnosis: Same  Procedure: 1.  Harvest of left leg great saphenous vein 2.  Left common femoral artery to anterior tibial artery bypass with ipsilateral nonreversed great saphenous vein  Surgeon: Dr. Marty Heck, MD  Assistant: Leontine Locket, PA  Indications: 71 year old male seen in the office with a diabetic ulcer of the left second toe.  He had depressed ABI of 0.6 with a toe pressure of 0.  He underwent angiogram with Dr. Scot Dock showing multi-level occlusive disease.  He is here today for left leg anterior tibial bypass after risks benefits discussed.  An assistant was needed for vein harvest and for the anastomosis.    Findings: The left leg great saphenous vein was harvested from the saphenofemoral junction to just above the ankle and was of good caliber.  I sewed the bypass from the left common femoral artery in nonreversed fashion and this was tunneled in a lateral subcutaneous tunnel to the anterior tibial artery in the distal calf.  The anterior tibial was heavily calcified and I could only find one soft spot for anastomosis.  Palpable pulse in the bypass to completion.  Brisk AT and dorsalis pedis signal in the foot distally.  Anesthesia: General  EBL: 125 mL  Details: Patient was taken to the operating room after informed consent was obtained.  Placed on operative table in the supine position.  General endotracheal anesthesia was induced.  I then used the SonoSite ultrasound machine to mark the left great saphenous vein from the saphenofemoral junction to the ankle and this appeared to be of good caliber and was patent.  The left groin, the left leg, and left foot were then prepped and draped standard sterile fashion.  Antibiotics were given.  Timeout performed.  Initially made a horizontal incision in the left groin above the groin crease  dissected down to to the common femoral artery that was then dissected out including the distal external iliac artery as well as SFA and profunda and all these were controlled with Vesseloops.  The artery was soft anteriorly for a anastomosis with a good femoral pulse.  I then made multiple skip incisions down the left leg and harvested the left leg great saphenous vein from the saphenofemoral junction all the way down to the ankle.  All side branches were ligated between 3-0 silk ties and divided.  We did leave skin bridges.  Ultimately once I had fully harvested the vein all the way down to the ankle this was ligated over right angle clamp and I tied this with 2-0 silk and passed the vein between the skin tunnels and then it was transected at the saphenofemoral junction over right angle clamp and this was oversewn with 5-0 Prolene.  The vein was reversed on the back table with a vessel cannula.  It dilated nicely.  Several small sidebranches had to be repaired.  I I then made a longitudinal incision 2 fingerbreadths lateral to the tibia over the between anterior lateral compartments.  Ultimately I then dissected down and found the anterior tibial artery that was dissected away from the veins.  This artery was circumferentially calcified and I found one small spot for anastomosis.  I then tunneled with a Gore tunneler from the anterior tibial exposure up to the common femoral artery through the lateral subcutaneous tunnel.  I then brought the vein on the field in nonreversed fashion and  this was spatulated.  The patient was given 100 units per kilogram IV heparin.  We checked an ACT to maintain greater than 250.  I then controlled the SFA and profunda with Vesseloops and the distal external iliac was controlled with a Henley clamp.  I then opened the left common femoral artery 11 blade scalpel extended with Potts scissors and I oriented the arteriotomy to the lateral subcutaneous tunnel.  I used a 4 mm aortic  punch.  I then performed an end to side anastomosis to the left common femoral artery with vein graft using 5-0 Prolene parachute technique with the help of my assistant.  Once we came off clamps there was no flow in the bypass given that we had not lysed valves.  We then used a valvulotome and carefully lysed all the valves and had excellent pulsatile flow distally in the bypass.  I placed 2 medium clips on the bypass distally and then we marked for orientation.  The vein was then tied to the tunneler and I then carefully passed this through the lateral subcutaneous tunnel in the left leg to the anterior tibial target.  He had enough vein to reach the target.  I used Vesseloops on the anterior tibial vessel and opened this with 11 blade scalpel and Potts scissors.  I then spatulated the vein and end to side anastomosis was sewn to the left anterior tibial artery with 6-0 Prolene with the help of my assistant.  Prior to completion of this I did pass a #1.5 and #2 dilator down the anterior tibial and had good backbleeding.  Once we finished the anastomosis we de-aired the artery as well as the bypass.  We came off clamps and that a great pulse in the bypass.  There was a brisk anterior tibial and dorsalis pedis signal.  We then gave protamine for reversal.  All the incisions were then closed with 2-0 Vicryl, 3-0 Vicryl, 4-0 Monocryl and Dermabond.  The lateral incision in the calf for the anterior tibial anastomosis was closed with 3-0 nylon's with horizontal mattress given I could not get the skin approximated without some tension.  Taken to recovery in stable condition.  Complication: None  Condition: Stable  Marty Heck, MD [facility] Office: [place]

## 2022-01-29 NOTE — Transfer of Care (Signed)
Immediate Anesthesia Transfer of Care Note  Patient: [name]  Procedure(s) Performed: LEFT COMMON FEMORAL BYPASS TO ANTERIOR TIBIAL ARTERY (Left: Leg Lower) HARVEST OF LEFT LEG GREAT SAPHENOUS VEIN (Left: Leg Lower)  Patient Location: PACU  Anesthesia Type:General  Level of Consciousness: drowsy and patient cooperative  Airway & Oxygen Therapy: Patient Spontanous Breathing  Post-op Assessment: Report given to RN and Post -op Vital signs reviewed and stable  Post vital signs: Reviewed and stable  Last Vitals:  Vitals Value Taken Time  BP 124/65 01/29/22 1555  Temp    Pulse 82 01/29/22 1600  Resp 16 01/29/22 1600  SpO2 95 % 01/29/22 1600  Vitals shown include unvalidated device data.  Last Pain:  Vitals:   01/29/22 0834  TempSrc: Oral  PainSc:          Complications: No notable events documented.

## 2022-01-29 NOTE — H&P (Signed)
History and Physical Interval Note:  01/29/2022 10:54 AM  [name]  has presented today for surgery, with the diagnosis of Critical limb ischemia of left lower extremity.  The various methods of treatment have been discussed with the patient and family. After consideration of risks, benefits and other options for treatment, the patient has consented to  Procedure(s): LEFT SUPERFICIAL Sophia (Left) as a surgical intervention.  The patient's history has been reviewed, patient examined, no change in status, stable for surgery.  I have reviewed the patient's chart and labs.  Questions were answered to the patient's satisfaction.    Discussed plan for left lower extremity femoral to tibial bypass.  He has critical limb ischemia with tissue loss.  Worsening left second toe wound.  Risk benefits discussed.  Discussed the anterior tibial target looks marginal as well.  Marty Heck        Patient name: [name]       MRN: [id number]        DOB: April 06, 1950        Sex: male   REASON FOR CONSULT: Left second toe ulcer   HPI: [name] is a 71 y.o. male, with history of hypertension, hyperlipidemia, coronary artery disease, diabetes, prostate cancer that presents for evaluation of a left second toe ulcer.  He states this has been present for about 4 weeks.  States this was related to a dressing to offload pressure between his toes that rubbed a blister.  He states this has gotten much worse over the last 4 weeks.  He is followed by podiatry who has been debriding this and then advised him to put Betadine on the wound.  No previous lower extremity revascularizations.       Past Medical History:  Diagnosis Date   Allergy      seasonal   Arthritis      " all over me- back, hips, knees, hands, everywhere "   Cancer [facility])      Prostate cancer age 73; Lupron treatments followed by prostatectomy.   Cataract      left removed, forming  right   Chronic constipation      x 23 yrs after prostate surgery   Diabetes mellitus without complication (HCC)     Diabetic retinal damage of both eyes ([place])      gets shots every month   GERD (gastroesophageal reflux disease)     Hyperlipidemia     Hypertension     Myocardial infarction [facility])      age 89; no stenting.   OSA (obstructive sleep apnea)      non-compliant with CPAP.   Sleep apnea      no cpap           Past Surgical History:  Procedure Laterality Date   APPENDECTOMY       CHOLECYSTECTOMY       PROSTATE SURGERY               Family History  Problem Relation Age of Onset   Cancer Mother          leukemia   Diabetes Mother     Cancer Father 61        lung cancer with mets   Colon cancer Maternal Grandmother     Heart disease Maternal Grandmother     Heart disease Maternal Grandfather     Heart disease Paternal Grandmother     Cancer Paternal  Grandfather          lung, prostate   Esophageal cancer Neg Hx     Colon polyps Neg Hx     Stomach cancer Neg Hx     Rectal cancer Neg Hx        SOCIAL HISTORY: Social History         Socioeconomic History   Marital status: Single      Spouse name: GF-Betty   Number of children: 2   Years of education: 11   Highest education level: Not on file  Occupational History   Occupation: Water engineer: [employer]   Tobacco Use   Smoking status: Never   Smokeless tobacco: Current      Types: Chew  Vaping Use   Vaping Use: Never used  Substance and Sexual Activity   Alcohol use: Yes      Comment: social   Drug use: Not Currently   Sexual activity: Not Currently  Other Topics Concern   Not on file  Social History Narrative    Marital status: divorced, girlfriend x 1977      Children: 1       Tobacco: none; chew tobacco       Alcohol: beers on weekends       Exercise: none    Social Determinants of Health        Financial Resource Strain: Low Risk  (11/25/2020)    Overall  Financial Resource Strain (CARDIA)     Difficulty of Paying Living Expenses: Not hard at all  Food Insecurity: Food Insecurity Present (11/25/2020)    Hunger Vital Sign     Worried About Running Out of Food in the Last Year: Sometimes true     Ran Out of Food in the Last Year: Sometimes true  Transportation Needs: No Transportation Needs (11/25/2020)    PRAPARE - Armed forces logistics/support/administrative officer (Medical): No     Lack of Transportation (Non-Medical): No  Physical Activity: Insufficiently Active (11/25/2020)    Exercise Vital Sign     Days of Exercise per Week: 7 days     Minutes of Exercise per Session: 10 min  Stress: Stress Concern Present (11/25/2020)    [place]     Feeling of Stress : To some extent  Social Connections: Moderately Isolated (11/25/2020)    Social Connection and Isolation Panel [NHANES]     Frequency of Communication with Friends and Family: More than three times a week     Frequency of Social Gatherings with Friends and Family: More than three times a week     Attends Religious Services: More than 4 times per year     Active Member of [employer] or Organizations: No     Attends Archivist Meetings: Never     Marital Status: Divorced  Human resources officer Violence: Not At Risk (11/25/2020)    Humiliation, Afraid, Rape, and Kick questionnaire     Fear of Current or Ex-Partner: No     Emotionally Abused: No     Physically Abused: No     Sexually Abused: No          Allergies  Allergen Reactions   Cymbalta [Duloxetine Hcl] Nausea And Vomiting   Darvocet [Propoxyphene N-Acetaminophen] Hives            Current Outpatient Medications  Medication Sig Dispense Refill   aspirin 81 MG tablet Take 81  mg by mouth daily.       azelastine (ASTELIN) 0.1 % nasal spray PLACE 2 SPRAYS INTO BOTH NOSTRILS 2 (TWO) TIMES DAILY. USE IN EACH NOSTRIL AS DIRECTED 30 mL 1   BAYER CONTOUR NEXT TEST test strip  CHECK BLOOD SUGAR TWICE DAILY 100 each 5   BAYER MICROLET LANCETS lancets Check blood sugar two times daily. 100 each 5   Blood Glucose Monitoring Suppl (BAYER CONTOUR NEXT MONITOR) w/Device KIT Check blood sugar two times daily. 1 kit 2   fluvastatin XL (LESCOL XL) 80 MG 24 hr tablet Take 1 tablet (80 mg total) by mouth daily. start taking every other day for the first 2 weeks then daily 30 tablet 3   gabapentin (NEURONTIN) 300 MG capsule Take 3 capsules (900 mg total) by mouth 2 (two) times daily. 180 capsule 2   lisinopril (ZESTRIL) 5 MG tablet Take 1 tablet (5 mg total) by mouth daily. 90 tablet 3   meloxicam (MOBIC) 7.5 MG tablet Take 1 tablet (7.5 mg total) by mouth daily. 30 tablet 0   metFORMIN (GLUCOPHAGE-XR) 500 MG 24 hr tablet Take 3 tablets (1,500 mg total) by mouth daily with breakfast AND 1 tablet (500 mg total) every evening. 4 tablets daily. 120 tablet 5   pantoprazole (PROTONIX) 40 MG tablet TAKE 1 TABLET (40 MG TOTAL) BY MOUTH TWICE A DAY BEFORE MEALS 180 tablet 0   Polyethylene Glycol 3350 (MIRALAX PO) Take by mouth as needed.       silver sulfADIAZINE (SILVADENE) 1 % cream Apply 1 Application topically 2 (two) times daily. 50 g 0   sulfamethoxazole-trimethoprim (BACTRIM DS) 800-160 MG tablet Take 1 tablet by mouth 2 (two) times daily. 14 tablet 0   trimethoprim-polymyxin b (POLYTRIM) ophthalmic solution SMARTSIG:In Eye(s)        No current facility-administered medications for this visit.      REVIEW OF SYSTEMS:  [X] denotes positive finding, [ ] denotes negative finding Cardiac   Comments:  Chest pain or chest pressure:      Shortness of breath upon exertion:      Short of breath when lying flat:      Irregular heart rhythm:             Vascular      Pain in calf, thigh, or hip brought on by ambulation:      Pain in feet at night that wakes you up from your sleep:       Blood clot in your veins:      Leg swelling:              Pulmonary      Oxygen at home:       Productive cough:       Wheezing:              Neurologic      Sudden weakness in arms or legs:       Sudden numbness in arms or legs:       Sudden onset of difficulty speaking or slurred speech:      Temporary loss of vision in one eye:       Problems with dizziness:              Gastrointestinal      Blood in stool:       Vomited blood:              Genitourinary      Burning when urinating:  Blood in urine:             Psychiatric      Major depression:              Hematologic      Bleeding problems:      Problems with blood clotting too easily:             Skin      Rashes or ulcers:             Constitutional      Fever or chills:          PHYSICAL EXAM:    Vitals:    01/14/22 1331  BP: (!) 140/81  Pulse: 82  Resp: 16  Temp: 97.8 F (36.6 C)  TempSrc: Temporal  SpO2: 96%  Weight: 218 lb (98.9 kg)  Height: 5' 11" (1.803 m)      GENERAL: The patient is a well-nourished male, in no acute distress. The vital signs are documented above. CARDIAC: There is a regular rate and rhythm.  VASCULAR:  Palpable femoral pulses bilaterally Left popliteal artery pulse palpable No palpable left pedal pulses Left 2nd toe ulcer as pictured  Right DP palpable PULMONARY: No respiratory distress ABDOMEN: Soft and non-tender. MUSCULOSKELETAL: There are no major deformities or cyanosis. NEUROLOGIC: No focal weakness or paresthesias are detected. PSYCHIATRIC: The patient has a normal affect.      DATA:    ABIs on the left are 0.65 with abnormal monophasic waveform at the ankle and toe pressure of 0   Assessment/Plan:   71 year old male presents with critical limb ischemia with tissue loss in the left lower extremity.  He has an abnormal ABI of 0.6 on the left with a monophasic waveform at the ankle.  He also has a flat toe pressure of 0.  I discussed this is inadequate for wound healing.  I recommended aortogram with lower extremity arteriogram with a focus on  the left leg.  He does have a popliteal pulse on the left side suspect this is either tibial or small vessel disease.  Unfortunately I am out of town this Thursday and next Thursday is Thanksgiving on my normal angiogram day.  Discussed we will get this scheduled with one of my partners as next available surgeon given this has progressed fairly quickly.  I am happy to do any open revascularization if he has no endovascular options as I discussed with him today.  Risks benefits discussed.  We will get him scheduled today.     Marty Heck, MD [facility] Office: [phone]

## 2022-01-29 NOTE — Progress Notes (Signed)
  Day of Surgery Note    Subjective:  no complaints   Vitals:   01/29/22 0834  BP: (!) 173/74  Pulse: 76  Resp: 16  Temp: 98.1 F (36.7 C)  SpO2: 97%    Incisions:   all incisions look fine with minimal ooze Extremities:  easily palpable graft pulse and left DP pulse Cardiac:  regular Lungs:  non labored    Assessment/Plan:  This is a 71 y.o. male who is s/p  Left common femoral to ATA bypass with non reversed ipsilateral GSV  -pt with easily palpable left graft pulse and DP pulse -incisions look fine -to 4 east later today -SSI for DM   Leontine Locket, PA-C 01/29/2022 4:01 PM [phone]

## 2022-01-30 ENCOUNTER — Encounter (HOSPITAL_COMMUNITY): Payer: Self-pay | Admitting: Vascular Surgery

## 2022-01-30 ENCOUNTER — Ambulatory Visit: Payer: Medicare HMO | Admitting: Podiatry

## 2022-01-30 ENCOUNTER — Encounter (HOSPITAL_BASED_OUTPATIENT_CLINIC_OR_DEPARTMENT_OTHER): Payer: Medicare HMO | Admitting: General Surgery

## 2022-01-30 LAB — CBC
HCT: 33.4 % — ABNORMAL LOW (ref 39.0–52.0)
Hemoglobin: 11.7 g/dL — ABNORMAL LOW (ref 13.0–17.0)
MCH: 30.6 pg (ref 26.0–34.0)
MCHC: 35 g/dL (ref 30.0–36.0)
MCV: 87.4 fL (ref 80.0–100.0)
Platelets: 175 10*3/uL (ref 150–400)
RBC: 3.82 MIL/uL — ABNORMAL LOW (ref 4.22–5.81)
RDW: 11.7 % (ref 11.5–15.5)
WBC: 8.7 10*3/uL (ref 4.0–10.5)
nRBC: 0 % (ref 0.0–0.2)

## 2022-01-30 LAB — BASIC METABOLIC PANEL
Anion gap: 11 (ref 5–15)
BUN: 8 mg/dL (ref 8–23)
CO2: 24 mmol/L (ref 22–32)
Calcium: 8 mg/dL — ABNORMAL LOW (ref 8.9–10.3)
Chloride: 102 mmol/L (ref 98–111)
Creatinine, Ser: 0.83 mg/dL (ref 0.61–1.24)
GFR, Estimated: >60 mL/min (ref >60)
Glucose, Bld: 109 mg/dL — ABNORMAL HIGH (ref 70–99)
Potassium: 3.6 mmol/L (ref 3.5–5.1)
Sodium: 137 mmol/L (ref 135–145)

## 2022-01-30 LAB — LIPID PANEL
Cholesterol: 144 mg/dL (ref 0–200)
HDL: 33 mg/dL — ABNORMAL LOW (ref >40)
LDL Cholesterol: 94 mg/dL (ref 0–99)
Total CHOL/HDL Ratio: 4.4 RATIO
Triglycerides: 86 mg/dL (ref <150)
VLDL: 17 mg/dL (ref 0–40)

## 2022-01-30 LAB — GLUCOSE, CAPILLARY
Glucose-Capillary: 112 mg/dL — ABNORMAL HIGH (ref 70–99)
Glucose-Capillary: 166 mg/dL — ABNORMAL HIGH (ref 70–99)
Glucose-Capillary: 116 mg/dL — ABNORMAL HIGH (ref 70–99)
Glucose-Capillary: 146 mg/dL — ABNORMAL HIGH (ref 70–99)

## 2022-01-30 MED ORDER — ROSUVASTATIN CALCIUM 20 MG PO TABS
20.0000 mg | ORAL_TABLET | Freq: Every day | ORAL | Status: DC
  Administered 2022-01-31 – 2022-02-11 (×11): 20 mg via ORAL
  Filled 2022-01-30 (×11): qty 1

## 2022-01-30 MED ORDER — ROSUVASTATIN CALCIUM 5 MG PO TABS
10.0000 mg | ORAL_TABLET | Freq: Once | ORAL | Status: AC
  Administered 2022-01-30: 10 mg via ORAL
  Filled 2022-01-30: qty 2

## 2022-01-30 NOTE — Progress Notes (Signed)
Time intended for [phone]

## 2022-01-30 NOTE — Progress Notes (Signed)
PHARMACIST LIPID MONITORING   [name] is a 71 y.o. male admitted on 01/29/2022 with critical libm ischemia.  Pharmacy has been consulted to optimize lipid-lowering therapy with the indication of secondary prevention for clinical ASCVD.  Recent Labs:  Lipid Panel (last 6 months):   Lab Results  Component Value Date   CHOL 144 01/30/2022   TRIG 86 01/30/2022   HDL 33 (L) 01/30/2022   CHOLHDL 4.4 01/30/2022   VLDL 17 01/30/2022   LDLCALC 94 01/30/2022    Hepatic function panel (last 6 months):   Lab Results  Component Value Date   AST 16 01/29/2022   ALT 16 01/29/2022   ALKPHOS 75 01/29/2022   BILITOT 1.4 (H) 01/29/2022    SCr (since admission):   Serum creatinine: 0.83 mg/dL 01/30/22 0450 Estimated creatinine clearance: 99.1 mL/min  Current therapy and lipid therapy tolerance Current lipid-lowering therapy: rosuvastatin 10 mg daily Previous lipid-lowering therapies (if applicable): fluvastatin XL Documented or reported allergies or intolerances to lipid-lowering therapies (if applicable): none  Assessment:   Patient agrees with changes to lipid-lowering therapy  Plan:    1.Statin intensity (high intensity recommended for all patients regardless of the LDL):  Add or increase statin to high intensity. Increased to rosuvastatin 20 mg daily.  2.Add ezetimibe (if any one of the following):   Not indicated at this time.  3.Refer to lipid clinic:   No  4.Follow-up with:  Primary care provider - Tresa Garter, MD  5.Follow-up labs after discharge:  Changes in lipid therapy were made. Check a lipid panel in 8-12 weeks then annually.      Thank you for involving pharmacy in this patient's care.  Renold Genta, PharmD, BCPS Clinical Pharmacist Clinical phone for 01/30/2022 is [phone] 01/30/2022 11:28 AM

## 2022-01-30 NOTE — Progress Notes (Addendum)
[facility]  Subjective  -    Objective 109/63 100 99.8 F (37.7 C) (Oral) 17 97%  Intake/Output Summary (Last 24 hours) at 01/30/2022 0648 Last data filed at 01/30/2022 4132 Gross per 24 hour  Intake 1400 ml  Output 2175 ml  Net -775 ml    Doppler DP brisk, palpable graft pulse Incisions healing well Lungs non labored breathing    Assessment/Planning: 71 year old male seen in the office with a diabetic ulcer of the left second toe.  He had depressed ABI of 0.6 with a toe pressure of 0.  He underwent angiogram with Dr. Scot Dock showing multi-level occlusive disease.   POD # 1 Procedure: 1.  Harvest of left leg great saphenous vein 2.  Left common femoral artery to anterior tibial artery bypass with ipsilateral nonreversed great saphenous vein  Good in flow with brisk doppler signals Incisions healing well Mobility today and pain control   Roxy Horseman 01/30/2022 6:48 AM --  VASCULAR STAFF ADDENDUM: I have independently interviewed and examined the patient. I agree with the above.  Excellent triphasic signal, normal edema in the foot and leg, elevate when in bed OOB, PT, home in coming days  J. Melene Muller, MD [facility] Phone Number: [phone] 01/30/2022 10:01 AM    Laboratory Lab Results: Recent Labs    01/29/22 0830 01/30/22 0450  WBC 11.4* 8.7  HGB 14.1 11.7*  HCT 40.8 33.4*  PLT 216 175   BMET Recent Labs    01/29/22 0830 01/30/22 0450  NA 134* 137  K 3.7 3.6  CL 100 102  CO2 24 24  GLUCOSE 118* 109*  BUN 13 8  CREATININE 0.95 0.83  CALCIUM 9.0 8.0*    COAG Lab Results  Component Value Date   INR 1.0 01/29/2022   INR 1.0 08/08/2017   No results found for: "PTT"

## 2022-01-30 NOTE — Evaluation (Signed)
Physical Therapy Evaluation Patient Details Name: [name] MRN: [id number] DOB: October 01, 1950 Today's Date: 01/30/2022  History of Present Illness  Pt is a 71 y/o male presenting on 11/29 with L second toe ulcer.  S/P L common femoral artery to anterior tibial artery bypass with ipsilateral nonreversed great saphenous vein. PMH includes: HTN, CAD, DM, prostate cancer, MI, cataracts, OSA   Clinical Impression  Pt admitted with above diagnosis. PTA pt lived alone independent. On eval, he required min assist bed mobility, min guard assist transfers, and min guard assist ambulation 20' x 2 with RW. Mobility limited by pain. Suspect pt will progress quickly with mobility as pain in managed. Pt currently with functional limitations due to the deficits listed below (see PT Problem List). Pt will benefit from skilled PT to increase their independence and safety with mobility to allow discharge home.          Recommendations for follow up therapy are one component of a multi-disciplinary discharge planning process, led by the attending physician.  Recommendations may be updated based on patient status, additional functional criteria and insurance authorization.  Follow Up Recommendations No PT follow up      Assistance Recommended at Discharge PRN  Patient can return home with the following  Assistance with cooking/housework;Assist for transportation;Help with stairs or ramp for entrance    Equipment Recommendations Rolling walker (2 wheels)  Recommendations for Other Services       Functional Status Assessment Patient has had a recent decline in their functional status and demonstrates the ability to make significant improvements in function in a reasonable and predictable amount of time.     Precautions / Restrictions Precautions Precautions: Fall      Mobility  Bed Mobility Overal bed mobility: Needs Assistance Bed Mobility: Supine to Sit     Supine to sit: Min assist, HOB  elevated     General bed mobility comments: +rail, increased time, assist with trunk    Transfers Overall transfer level: Needs assistance Equipment used: Rolling walker (2 wheels) Transfers: Sit to/from Stand Sit to Stand: Min guard           General transfer comment: cues for hand placement and sequencing, increased time    Ambulation/Gait Ambulation/Gait assistance: Min guard Gait Distance (Feet): 20 Feet (x 2) Assistive device: Rolling walker (2 wheels) Gait Pattern/deviations: Step-to pattern, Decreased weight shift to left, Antalgic, Trunk flexed Gait velocity: decreased Gait velocity interpretation: <1.31 ft/sec, indicative of household ambulator   General Gait Details: distance limited by pain  Stairs            Wheelchair Mobility    Modified Rankin (Stroke Patients Only)       Balance Overall balance assessment: Needs assistance Sitting-balance support: No upper extremity supported, Feet supported Sitting balance-Leahy Scale: Fair     Standing balance support: Bilateral upper extremity supported, During functional activity, Reliant on assistive device for balance Standing balance-Leahy Scale: Poor                               Pertinent Vitals/Pain Pain Assessment Pain Assessment: Faces Faces Pain Scale: Hurts whole lot Pain Location: LLE Pain Descriptors / Indicators: Sharp, Shooting, Grimacing, Operative site guarding Pain Intervention(s): Limited activity within patient's tolerance, Monitored during session, Repositioned, Patient requesting pain meds-RN notified    Home Living Family/patient expects to be discharged to:: Private residence Living Arrangements: Alone Available Help at Discharge: Neighbor;Available 24  hours/day Type of Home: House Home Access: Stairs to enter   [employer] of Steps: 1 (threshold)   Home Layout: One level Home Equipment: None Additional Comments: Pt plans to stay with his  neighbor, [name]. She is 81. She can provide supervision/set up, meals, and transportation.    Prior Function Prior Level of Function : Independent/Modified Independent                     Hand Dominance        Extremity/Trunk Assessment   Upper Extremity Assessment Upper Extremity Assessment: Defer to OT evaluation    Lower Extremity Assessment Lower Extremity Assessment: LLE deficits/detail LLE Deficits / Details: s/p arterial bypass    Cervical / Trunk Assessment Cervical / Trunk Assessment: Kyphotic  Communication   Communication: No difficulties  Cognition Arousal/Alertness: Awake/alert Behavior During Therapy: WFL for tasks assessed/performed Overall Cognitive Status: Within Functional Limits for tasks assessed                                          General Comments      Exercises     Assessment/Plan    PT Assessment Patient needs continued PT services  PT Problem List Decreased strength;Decreased balance;Pain;Decreased knowledge of precautions;Decreased mobility;Decreased activity tolerance       PT Treatment Interventions DME instruction;Functional mobility training;Patient/family education;Gait training;Stair training;Therapeutic activities;Therapeutic exercise;Balance training    PT Goals (Current goals can be found in the Care Plan section)  Acute Rehab PT Goals Patient Stated Goal: decrease pain PT Goal Formulation: With patient Time For Goal Achievement: 02/13/22 Potential to Achieve Goals: Good    Frequency Min 3X/week     Co-evaluation               AM-PAC PT "6 Clicks" Mobility  Outcome Measure Help needed turning from your back to your side while in a flat bed without using bedrails?: A Little Help needed moving from lying on your back to sitting on the side of a flat bed without using bedrails?: A Little Help needed moving to and from a bed to a chair (including a wheelchair)?: A Little Help needed  standing up from a chair using your arms (e.g., wheelchair or bedside chair)?: A Little Help needed to walk in hospital room?: A Little Help needed climbing 3-5 steps with a railing? : A Lot 6 Click Score: 17    End of Session Equipment Utilized During Treatment: Gait belt Activity Tolerance: Patient limited by pain Patient left: in chair;with call bell/phone within reach Nurse Communication: Patient requests pain meds;Mobility status PT Visit Diagnosis: Difficulty in walking, not elsewhere classified (R26.2)    Time: 9937-1696 PT Time Calculation (min) (ACUTE ONLY): 28 min   Charges:   PT Evaluation $PT Eval Moderate Complexity: 1 Mod PT Treatments $Gait Training: 8-22 mins        Lorrin Goodell, PT  Office # [phone] Pager [phone]   Lorriane Shire 01/30/2022, 9:49 AM

## 2022-01-30 NOTE — Progress Notes (Signed)
Mobility Specialist Progress Note:   01/30/22 1013  Mobility  Activity Transferred from chair to bed  Level of Assistance Standby assist, set-up cues, supervision of patient - no hands on  Assistive Device Front wheel walker  Distance Ambulated (ft) 2 ft  Activity Response Tolerated well  Mobility Referral Yes  $Mobility charge 1 Mobility   Pt in chair asking to get back to bed. Complaints of "extreme" pain in LLE. Left in bed with call bell in reach and all needs met.   Gareth Eagle Lovella Hardie Mobility Specialist Please contact via [employer] or  Rehab Office at [phone]

## 2022-01-30 NOTE — Evaluation (Signed)
Occupational Therapy Evaluation Patient Details Name: [name] MRN: [id number] DOB: [birth year] Today's Date: 01/30/2022   History of Present Illness Pt is a 71 y/o male presenting on 11/29 with L second toe ulcer.  S/P L common femoral artery to anterior tibial artery bypass with ipsilateral nonreversed great saphenous vein. PMH includes: HTN, CAD, DM, prostate cancer, MI, cataracts, OSA   Clinical Impression   PTA patient reports independent. Admitted for above and present with problem list below, including L LE pain, impaired balance, decreased activity tolerance.  Patient currently requires up to mod assist for LB ADLs, min guard for transfers and limited in room mobility. He has a neighbor/girlfriend who can assist with IADLs, but cannot physically assist him.  Based on performance today, believe he will progress well and anticipate no further needs after dc home.  Will follow acutely to optimize independence with ADLs.      Recommendations for follow up therapy are one component of a multi-disciplinary discharge planning process, led by the attending physician.  Recommendations may be updated based on patient status, additional functional criteria and insurance authorization.   Follow Up Recommendations  No OT follow up     Assistance Recommended at Discharge Frequent or constant Supervision/Assistance  Patient can return home with the following A little help with walking and/or transfers;A lot of help with bathing/dressing/bathroom;Help with stairs or ramp for entrance;Assist for transportation;Assistance with cooking/housework    Functional Status Assessment  Patient has had a recent decline in their functional status and demonstrates the ability to make significant improvements in function in a reasonable and predictable amount of time.  Equipment Recommendations  BSC/3in1;Other (comment) (RW)    Recommendations for Other Services       Precautions / Restrictions  Precautions Precautions: Fall Restrictions Weight Bearing Restrictions: No      Mobility Bed Mobility Overal bed mobility: Needs Assistance Bed Mobility: Supine to Sit, Sit to Supine     Supine to sit: Min assist, HOB elevated Sit to supine: Supervision   General bed mobility comments: trunk support to ascend    Transfers Overall transfer level: Needs assistance Equipment used: Rolling walker (2 wheels) Transfers: Sit to/from Stand Sit to Stand: Min guard                  Balance Overall balance assessment: Needs assistance Sitting-balance support: No upper extremity supported, Feet supported, Single extremity supported Sitting balance-Leahy Scale: Fair Sitting balance - Comments: initally min assist, increased time to scoot forward and leaning towards R side to offweight painful L side but once stablized, supervision   Standing balance support: Bilateral upper extremity supported, During functional activity, No upper extremity supported Standing balance-Leahy Scale: Poor Standing balance comment: briefly able to demonsrate 0-1 hand support with min guard, relies on BUE Support dynamically                           ADL either performed or assessed with clinical judgement   ADL Overall ADL's : Needs assistance/impaired     Grooming: Set up;Sitting           Upper Body Dressing : Sitting;Min guard   Lower Body Dressing: Moderate assistance;Sit to/from stand Lower Body Dressing Details (indicate cue type and reason): requires assist for socks, min guard to min assist in standing Toilet Transfer: Min guard;Ambulation;Rolling walker (2 wheels) Toilet Transfer Details (indicate cue type and reason): simulated in room  Functional mobility during ADLs: Min guard;Rolling walker (2 wheels)       Vision   Vision Assessment?: No apparent visual deficits     Perception     Praxis      Pertinent Vitals/Pain Pain Assessment Pain  Assessment: Faces Faces Pain Scale: Hurts whole lot Pain Location: LLE, top of L foot Pain Descriptors / Indicators: Sharp, Shooting, Grimacing, Operative site guarding Pain Intervention(s): Limited activity within patient's tolerance, Monitored during session, Repositioned     Hand Dominance     Extremity/Trunk Assessment Upper Extremity Assessment Upper Extremity Assessment: Overall WFL for tasks assessed   Lower Extremity Assessment Lower Extremity Assessment: Defer to PT evaluation       Communication Communication Communication: No difficulties   Cognition Arousal/Alertness: Awake/alert Behavior During Therapy: WFL for tasks assessed/performed Overall Cognitive Status: Within Functional Limits for tasks assessed                                       General Comments       Exercises     Shoulder Instructions      Home Living Family/patient expects to be discharged to:: Private residence Living Arrangements: Alone Available Help at Discharge: Neighbor;Available 24 hours/day Type of Home: House Home Access: Stairs to enter [employer] of Steps: 1 (threshold)   Home Layout: One level     Bathroom Shower/Tub: Teacher, early years/pre: Standard     Home Equipment: None   Additional Comments: Pt plans to stay with his neighbor, [name]. She is 81. She can provide supervision/set up, meals, and transportation.      Prior Functioning/Environment Prior Level of Function : Independent/Modified Independent                        OT Problem List: Decreased strength;Decreased activity tolerance;Impaired balance (sitting and/or standing);Decreased knowledge of use of DME or AE;Decreased knowledge of precautions;Pain;Decreased safety awareness      OT Treatment/Interventions: Self-care/ADL training;DME and/or AE instruction;Therapeutic exercise;Therapeutic activities;Patient/family education;Balance training    OT  Goals(Current goals can be found in the care plan section) Acute Rehab OT Goals Patient Stated Goal: less pain OT Goal Formulation: With patient Time For Goal Achievement: 02/13/22 Potential to Achieve Goals: Good  OT Frequency: Min 2X/week    Co-evaluation              AM-PAC OT "6 Clicks" Daily Activity     Outcome Measure Help from another person eating meals?: None Help from another person taking care of personal grooming?: A Little Help from another person toileting, which includes using toliet, bedpan, or urinal?: A Little Help from another person bathing (including washing, rinsing, drying)?: A Lot Help from another person to put on and taking off regular upper body clothing?: A Little Help from another person to put on and taking off regular lower body clothing?: A Lot 6 Click Score: 17   End of Session Equipment Utilized During Treatment: Rolling walker (2 wheels) Nurse Communication: Mobility status  Activity Tolerance: Patient tolerated treatment well;Patient limited by pain Patient left: in bed;with call bell/phone within reach;with bed alarm set  OT Visit Diagnosis: Other abnormalities of gait and mobility (R26.89);Pain                Time: 3154-0086 OT Time Calculation (min): 25 min Charges:  OT General Charges $OT Visit: 1 Visit OT  Evaluation $OT Eval Moderate Complexity: 1 Mod OT Treatments $Self Care/Home Management : 8-22 mins  Jolaine Artist, OT Acute Rehabilitation Services Office [phone]   Delight Stare 01/30/2022, 1:49 PM

## 2022-01-31 LAB — GLUCOSE, CAPILLARY
Glucose-Capillary: 164 mg/dL — ABNORMAL HIGH (ref 70–99)
Glucose-Capillary: 128 mg/dL — ABNORMAL HIGH (ref 70–99)
Glucose-Capillary: 160 mg/dL — ABNORMAL HIGH (ref 70–99)
Glucose-Capillary: 140 mg/dL — ABNORMAL HIGH (ref 70–99)

## 2022-01-31 NOTE — Plan of Care (Signed)
  Problem: Cardiovascular: Goal: Ability to achieve and maintain adequate cardiovascular perfusion will improve Outcome: Progressing Goal: Vascular access site(s) Level 0-1 will be maintained Outcome: Progressing   Problem: Activity: Goal: Ability to tolerate increased activity will improve Outcome: Progressing   Problem: Skin Integrity: Goal: Demonstration of wound healing without infection will improve Outcome: Progressing   Problem: Fluid Volume: Goal: Ability to maintain a balanced intake and output will improve Outcome: Progressing   Problem: Nutritional: Goal: Maintenance of adequate nutrition will improve Outcome: Progressing

## 2022-01-31 NOTE — Progress Notes (Addendum)
Occupational Therapy Treatment Patient Details Name: [name] MRN: [id number] DOB: 1950/08/20 Today's Date: 01/31/2022   History of present illness Pt is a 71 y/o male presenting on 11/29 with L second toe ulcer.  S/P L common femoral artery to anterior tibial artery bypass with ipsilateral nonreversed great saphenous vein. PMH includes: HTN, CAD, DM, prostate cancer, MI, cataracts, OSA   OT comments  Patient in bathroom upon entry.  Requires min guard for transfer with cueing for hand placement and technique, min assist to min guard for mobility in room for safety with RW.  He was educated on compensatory techniques and AE for ADLs, but continues to require assist for L LE management due to pain. He requires increased time for all tasks, cueing for safety and reports nausea (from vertigo) when returning back to bed.  Patient voices concern with assist from girlfriend/neighbor Betty at home, as she will not be able to physically assist him.  He remains limited by pain in L LE, activity tolerance, balance, and safety.  To optimize safety and independence, believe he will best benefit from short term SNF prior to dc home. Will follow acutely.    Recommendations for follow up therapy are one component of a multi-disciplinary discharge planning process, led by the attending physician.  Recommendations may be updated based on patient status, additional functional criteria and insurance authorization.    Follow Up Recommendations  Skilled nursing-short term rehab (<3 hours/day)     Assistance Recommended at Discharge Frequent or constant Supervision/Assistance  Patient can return home with the following  A little help with walking and/or transfers;A lot of help with bathing/dressing/bathroom;Help with stairs or ramp for entrance;Assist for transportation;Assistance with cooking/housework   Equipment Recommendations  BSC/3in1;Other (comment) (RW)    Recommendations for Other Services       Precautions / Restrictions Precautions Precautions: Fall Restrictions Weight Bearing Restrictions: No       Mobility Bed Mobility Overal bed mobility: Needs Assistance Bed Mobility: Sit to Supine       Sit to supine: Min guard   General bed mobility comments: increased time and cueing for technique, pt reports feeling "vertigo nausea" once supine (passes within a few minutes)    Transfers Overall transfer level: Needs assistance Equipment used: Rolling walker (2 wheels) Transfers: Sit to/from Stand Sit to Stand: Min assist, Min guard           General transfer comment: cues for hand placement and safety, min assist when fatgiued from EOB     Balance Overall balance assessment: Needs assistance Sitting-balance support: No upper extremity supported, Feet supported, Single extremity supported Sitting balance-Leahy Scale: Fair Sitting balance - Comments: supervision to min guard, limited dynamically when engaging in LB dressing   Standing balance support: Bilateral upper extremity supported, During functional activity, No upper extremity supported Standing balance-Leahy Scale: Poor Standing balance comment: brief 0 hand support during ADls with min guard, but relies on RW dynamically                           ADL either performed or assessed with clinical judgement   ADL Overall ADL's : Needs assistance/impaired     Grooming: Set up;Sitting               Lower Body Dressing: Moderate assistance;Sit to/from stand;With adaptive equipment;Cueing for compensatory techniques Lower Body Dressing Details (indicate cue type and reason): reviewed compensatory techniques and AE, pt able to don/doff  R sock with sock aide and reacher, declines to attempt L LE due to pain.  Min guard in standing. Toilet Transfer: Ambulation;Rolling walker (2 wheels);Minimal assistance Toilet Transfer Details (indicate cue type and reason): RW mgmt and safety, increased time          Functional mobility during ADLs: Minimal assistance;Min guard;Rolling walker (2 wheels)      Extremity/Trunk Assessment              Vision       Perception     Praxis      Cognition Arousal/Alertness: Awake/alert Behavior During Therapy: WFL for tasks assessed/performed Overall Cognitive Status: No family/caregiver present to determine baseline cognitive functioning                                 General Comments: pt with slow processing and decreased problem solving, min cueing for safety and attention to task- likely close to baseline        Exercises      Shoulder Instructions       General Comments VSS on RA    Pertinent Vitals/ Pain       Pain Assessment Pain Assessment: Faces Faces Pain Scale: Hurts whole lot Pain Location: L LE Pain Descriptors / Indicators: Sharp, Shooting, Grimacing, Operative site guarding Pain Intervention(s): Monitored during session, Limited activity within patient's tolerance, Repositioned  Home Living                                          Prior Functioning/Environment              Frequency  Min 2X/week        Progress Toward Goals  OT Goals(current goals can now be found in the care plan section)  Progress towards OT goals: Progressing toward goals  Acute Rehab OT Goals Patient Stated Goal: less pain OT Goal Formulation: With patient Time For Goal Achievement: 02/13/22 Potential to Achieve Goals: Good  Plan Discharge plan needs to be updated;Frequency remains appropriate    Co-evaluation                 AM-PAC OT "6 Clicks" Daily Activity     Outcome Measure   Help from another person eating meals?: None Help from another person taking care of personal grooming?: A Little Help from another person toileting, which includes using toliet, bedpan, or urinal?: A Little Help from another person bathing (including washing, rinsing, drying)?: A Lot Help from  another person to put on and taking off regular upper body clothing?: A Little Help from another person to put on and taking off regular lower body clothing?: A Lot 6 Click Score: 17    End of Session Equipment Utilized During Treatment: Rolling walker (2 wheels)  OT Visit Diagnosis: Other abnormalities of gait and mobility (R26.89);Pain   Activity Tolerance Patient tolerated treatment well   Patient Left in bed;with call bell/phone within reach;with bed alarm set   Nurse Communication Mobility status        Time: 9833-8250 OT Time Calculation (min): 25 min  Charges: OT General Charges $OT Visit: 1 Visit OT Treatments $Self Care/Home Management : 23-37 mins  [place] Office [phone]   Delight Stare 01/31/2022, 12:28 PM

## 2022-01-31 NOTE — Care Management Important Message (Signed)
Important Message  Patient Details  Name: [name] MRN: [id number] Date of Birth: [birth year]   Medicare Important Message Given:  Yes     [name] 01/31/2022, 8:37 AM

## 2022-01-31 NOTE — Progress Notes (Signed)
Physical Therapy Treatment Patient Details Name: [name] MRN: [id number] DOB: 08-08-1950 Today's Date: 01/31/2022   History of Present Illness Pt is a 71 y/o male presenting on 11/29 with L second toe ulcer.  S/P L common femoral artery to anterior tibial artery bypass with ipsilateral nonreversed great saphenous vein. PMH includes: HTN, CAD, DM, prostate cancer, MI, cataracts, OSA    PT Comments    Patient able to increase ambulation today, but still very slow and antalgic and needing assistance for up to EOB and for sit to stand.  Feel he his also high fall risk and has no true assistance at d/c.  Patient appropriate for STSNF level rehab and seems agreeable.  PT will continue to follow.    Recommendations for follow up therapy are one component of a multi-disciplinary discharge planning process, led by the attending physician.  Recommendations may be updated based on patient status, additional functional criteria and insurance authorization.  Follow Up Recommendations  Skilled nursing-short term rehab (<3 hours/day) Can patient physically be transported by private vehicle: Yes   Assistance Recommended at Discharge Intermittent Supervision/Assistance  Patient can return home with the following Assistance with cooking/housework;Assist for transportation;Help with stairs or ramp for entrance;A little help with walking and/or transfers;A little help with bathing/dressing/bathroom;Direct supervision/assist for medications management   Equipment Recommendations  Rolling walker (2 wheels)    Recommendations for Other Services       Precautions / Restrictions Precautions Precautions: Fall Restrictions Weight Bearing Restrictions: No     Mobility  Bed Mobility Overal bed mobility: Needs Assistance Bed Mobility: Supine to Sit     Supine to sit: Min assist, HOB elevated     General bed mobility comments: increased time, heavily reliant on rails, assist for trunk     Transfers Overall transfer level: Needs assistance Equipment used: Rolling walker (2 wheels) Transfers: Sit to/from Stand Sit to Stand: Min assist           General transfer comment: assist for balance, cues for hand placement, increased time    Ambulation/Gait Ambulation/Gait assistance: Min guard Gait Distance (Feet): 100 Feet Assistive device: Rolling walker (2 wheels) Gait Pattern/deviations: Step-to pattern, Step-through pattern, Wide base of support, Trunk flexed, Antalgic, Decreased stance time - left       General Gait Details: cues for using UE's to offload L LE due to pain, cues throughout for posture, walker proximity and to increase stride length as able   Stairs             Wheelchair Mobility    Modified Rankin (Stroke Patients Only)       Balance Overall balance assessment: Needs assistance   Sitting balance-Leahy Scale: Fair     Standing balance support: Bilateral upper extremity supported Standing balance-Leahy Scale: Poor Standing balance comment: UE support needed for standing due to pain L LE                            Cognition Arousal/Alertness: Awake/alert Behavior During Therapy: WFL for tasks assessed/performed Overall Cognitive Status: No family/caregiver present to determine baseline cognitive functioning                                 General Comments: pt with slow processing and decreased problem solving, min cueing for safety and attention to task- likely close to baseline  Exercises General Exercises - Lower Extremity Ankle Circles/Pumps: AROM, AAROM, 10 reps, Right, Left Heel Slides: AAROM, Left, 10 reps    General Comments General comments (skin integrity, edema, etc.): VSS on RA; stated had not had BM so wanting to sit on toilet so RN informed pt in bathroom end of session.      Pertinent Vitals/Pain Pain Assessment Faces Pain Scale: Hurts even more Pain Location: L LE Pain  Descriptors / Indicators: Sharp, Shooting, Grimacing, Operative site guarding Pain Intervention(s): Monitored during session, Repositioned    Home Living                          Prior Function            PT Goals (current goals can now be found in the care plan section) Progress towards PT goals: Progressing toward goals    Frequency    Min 3X/week      PT Plan Discharge plan needs to be updated    Co-evaluation              AM-PAC PT "6 Clicks" Mobility   Outcome Measure  Help needed turning from your back to your side while in a flat bed without using bedrails?: A Little Help needed moving from lying on your back to sitting on the side of a flat bed without using bedrails?: A Little Help needed moving to and from a bed to a chair (including a wheelchair)?: A Little Help needed standing up from a chair using your arms (e.g., wheelchair or bedside chair)?: A Little Help needed to walk in hospital room?: A Little Help needed climbing 3-5 steps with a railing? : Total 6 Click Score: 16    End of Session Equipment Utilized During Treatment: Gait belt Activity Tolerance: Patient limited by pain Patient left: Other (comment) (in bathroom aware to pull cord when ready)   PT Visit Diagnosis: Difficulty in walking, not elsewhere classified (R26.2);Pain Pain - Right/Left: Left Pain - part of body: Leg     Time: 4742-5956 PT Time Calculation (min) (ACUTE ONLY): 36 min  Charges:  $Gait Training: 8-22 mins $Therapeutic Activity: 8-22 mins                     Magda Kiel, PT Acute Rehabilitation Services [phone] 01/31/2022    [name] 01/31/2022, 2:01 PM

## 2022-01-31 NOTE — Progress Notes (Signed)
[facility]  Subjective  - pain in the left leg, similar to yesterday    Objective (!) 110/57 92 98.6 F (37 C) (Oral) 17 95%  Intake/Output Summary (Last 24 hours) at 01/31/2022 0834 Last data filed at 01/30/2022 1727 Gross per 24 hour  Intake 240 ml  Output 550 ml  Net -310 ml     Doppler DP brisk, palpable graft pulse Incisions healing well Lungs non labored breathing Significant edema    Assessment/Planning: 71 year old male seen in the office with a diabetic ulcer of the left second toe.  He had depressed ABI of 0.6 with a toe pressure of 0.  He underwent angiogram with Dr. Scot Dock showing multi-level occlusive disease.   2 Days Post-Op Procedure: 1.  Harvest of left leg great saphenous vein 2.  Left common femoral artery to anterior tibial artery bypass with ipsilateral nonreversed great saphenous vein   Pain control, OOB with PT/ nursing, elevated when in the bed Excellent triphasic signal, normal postop edema in the foot and leg, OOB, PT, home likely over the weekend  Cassandria Santee, MD [facility] Phone Number: [phone] 01/31/2022 8:34 AM    Laboratory Lab Results: Recent Labs    01/29/22 0830 01/30/22 0450  WBC 11.4* 8.7  HGB 14.1 11.7*  HCT 40.8 33.4*  PLT 216 175    BMET Recent Labs    01/29/22 0830 01/30/22 0450  NA 134* 137  K 3.7 3.6  CL 100 102  CO2 24 24  GLUCOSE 118* 109*  BUN 13 8  CREATININE 0.95 0.83  CALCIUM 9.0 8.0*     COAG Lab Results  Component Value Date   INR 1.0 01/29/2022   INR 1.0 08/08/2017   No results found for: "PTT"

## 2022-02-01 LAB — URINALYSIS, COMPLETE (UACMP) WITH MICROSCOPIC
Bacteria, UA: NONE SEEN
Bilirubin Urine: NEGATIVE
Glucose, UA: NEGATIVE mg/dL
Hgb urine dipstick: NEGATIVE
Ketones, ur: 5 mg/dL — AB
Leukocytes,Ua: NEGATIVE
Nitrite: NEGATIVE
Protein, ur: 30 mg/dL — AB
Specific Gravity, Urine: 1.023 (ref 1.005–1.030)
pH: 5 (ref 5.0–8.0)

## 2022-02-01 LAB — SURGICAL PCR SCREEN
MRSA, PCR: NEGATIVE
Staphylococcus aureus: NEGATIVE

## 2022-02-01 LAB — GLUCOSE, CAPILLARY
Glucose-Capillary: 120 mg/dL — ABNORMAL HIGH (ref 70–99)
Glucose-Capillary: 132 mg/dL — ABNORMAL HIGH (ref 70–99)
Glucose-Capillary: 161 mg/dL — ABNORMAL HIGH (ref 70–99)
Glucose-Capillary: 106 mg/dL — ABNORMAL HIGH (ref 70–99)

## 2022-02-01 NOTE — Progress Notes (Addendum)
Progress Note    02/01/2022 11:28 AM 3 Days Post-Op  Subjective:  his wife states he has walked once.  They said they were coming back to walk again and did not come back.    Tm 101.2 now afebrile HR 80's-100's NSR 42'H-062'B systolic 76% RA  Vitals:   02/01/22 0846 02/01/22 1110  BP: 111/67 123/77  Pulse: 86 80  Resp: 15 20  Temp:  98.8 F (37.1 C)  SpO2: 97% 97%    Physical Exam: General:  laying in bed; no distress Cardiac:  regular Lungs:  non labored Incisions:  all incisions look fine without evidence of infection Extremities:  palpable left DP pulse and graft pulse.  Left calf is soft and non tender   CBC    Component Value Date/Time   WBC 8.7 01/30/2022 0450   RBC 3.82 (L) 01/30/2022 0450   HGB 11.7 (L) 01/30/2022 0450   HGB 14.9 08/15/2020 1051   HCT 33.4 (L) 01/30/2022 0450   HCT 45.0 08/15/2020 1051   PLT 175 01/30/2022 0450   PLT 154 08/15/2020 1051   MCV 87.4 01/30/2022 0450   MCV 93 08/15/2020 1051   MCH 30.6 01/30/2022 0450   MCHC 35.0 01/30/2022 0450   RDW 11.7 01/30/2022 0450   RDW 12.5 08/15/2020 1051   LYMPHSABS 2.8 08/15/2020 1051   MONOABS 0.5 08/03/2018 0920   EOSABS 0.1 08/15/2020 1051   BASOSABS 0.0 08/15/2020 1051    BMET    Component Value Date/Time   NA 137 01/30/2022 0450   NA 142 05/02/2021 0932   K 3.6 01/30/2022 0450   CL 102 01/30/2022 0450   CO2 24 01/30/2022 0450   GLUCOSE 109 (H) 01/30/2022 0450   BUN 8 01/30/2022 0450   BUN 11 05/02/2021 0932   CREATININE 0.83 01/30/2022 0450   CREATININE 0.86 08/04/2015 1401   CALCIUM 8.0 (L) 01/30/2022 0450   GFRNONAA >60 01/30/2022 0450   GFRNONAA >89 08/04/2015 1401   GFRAA 90 05/06/2019 1054   GFRAA >89 08/04/2015 1401    INR    Component Value Date/Time   INR 1.0 01/29/2022 0830     Intake/Output Summary (Last 24 hours) at 02/01/2022 1128 Last data filed at 02/01/2022 1110 Gross per 24 hour  Intake 1172.77 ml  Output 650 ml  Net 522.77 ml       Assessment/Plan:  71 y.o. male is s/p:  1.  Harvest of left leg great saphenous vein 2.  Left common femoral artery to anterior tibial artery bypass with ipsilateral nonreversed great saphenous vein  3 Days Post-Op   -pt with palpable left DP pulse and graft pulse -pt with Tm 101.2 overnight now afebrile.  All incisions look fine.  Needs to work on [place].  Will check urine given he had catheter.  -strongly encouraged pt to be out of bed at least tid and ambulating in the hallways.  Mild swelling left leg not unexpected.  Elevate when not mobilizing -DVT prophylaxis:  sq heparin   Leontine Locket, PA-C [facility] [phone] 02/01/2022 11:28 AM  VASCULAR STAFF ADDENDUM: I have independently interviewed and examined the patient. I agree with the above.  Patient doing well this morning, he has been sedentary for the majority of his stay. Needs to ambulate multiple times per day Overnight Tmax 101.2-believed to be a spurious value and we will continue to monitor I had a long talk with Shanon Brow regarding the importance of ambulation.  He cannot go home until he  has passed physical therapy.  Currently SNF recommended.  Cassandria Santee, MD [facility] Phone Number: [phone] 02/01/2022 12:31 PM

## 2022-02-02 LAB — GLUCOSE, CAPILLARY
Glucose-Capillary: 124 mg/dL — ABNORMAL HIGH (ref 70–99)
Glucose-Capillary: 143 mg/dL — ABNORMAL HIGH (ref 70–99)
Glucose-Capillary: 151 mg/dL — ABNORMAL HIGH (ref 70–99)
Glucose-Capillary: 153 mg/dL — ABNORMAL HIGH (ref 70–99)

## 2022-02-02 NOTE — Progress Notes (Addendum)
  Progress Note    02/02/2022 7:38 AM 4 Days Post-Op  Subjective:  sitting on bedside just finished eating breakfast and using IS   Tm 100 now afebrile HR 70's-90's NSR 629'U-765'Y systolic 65% RA  Vitals:   02/01/22 2349 02/02/22 0338  BP: 139/76 120/67  Pulse: 83 83  Resp: 15 14  Temp: 100 F (37.8 C) 98.7 F (37.1 C)  SpO2: 92% 94%    Physical Exam: General:  no distress Cardiac:  regular Lungs:  non labored Incisions:  look fine Extremities:  palpable graft pulse   CBC    Component Value Date/Time   WBC 8.7 01/30/2022 0450   RBC 3.82 (L) 01/30/2022 0450   HGB 11.7 (L) 01/30/2022 0450   HGB 14.9 08/15/2020 1051   HCT 33.4 (L) 01/30/2022 0450   HCT 45.0 08/15/2020 1051   PLT 175 01/30/2022 0450   PLT 154 08/15/2020 1051   MCV 87.4 01/30/2022 0450   MCV 93 08/15/2020 1051   MCH 30.6 01/30/2022 0450   MCHC 35.0 01/30/2022 0450   RDW 11.7 01/30/2022 0450   RDW 12.5 08/15/2020 1051   LYMPHSABS 2.8 08/15/2020 1051   MONOABS 0.5 08/03/2018 0920   EOSABS 0.1 08/15/2020 1051   BASOSABS 0.0 08/15/2020 1051    BMET    Component Value Date/Time   NA 137 01/30/2022 0450   NA 142 05/02/2021 0932   K 3.6 01/30/2022 0450   CL 102 01/30/2022 0450   CO2 24 01/30/2022 0450   GLUCOSE 109 (H) 01/30/2022 0450   BUN 8 01/30/2022 0450   BUN 11 05/02/2021 0932   CREATININE 0.83 01/30/2022 0450   CREATININE 0.86 08/04/2015 1401   CALCIUM 8.0 (L) 01/30/2022 0450   GFRNONAA >60 01/30/2022 0450   GFRNONAA >89 08/04/2015 1401   GFRAA 90 05/06/2019 1054   GFRAA >89 08/04/2015 1401    INR    Component Value Date/Time   INR 1.0 01/29/2022 0830     Intake/Output Summary (Last 24 hours) at 02/02/2022 0738 Last data filed at 02/01/2022 1939 Gross per 24 hour  Intake 480 ml  Output 200 ml  Net 280 ml      Assessment/Plan:  71 y.o. male is s/p:  1.  Harvest of left leg great saphenous vein 2.  Left common femoral artery to anterior tibial artery bypass with  ipsilateral nonreversed great saphenous vein   4 Days Post-Op   -pt with palpable graft pulse.   -continue to increase mobilization-PT had recommended SNF-will ask them to re-evaluate pt as he did walk this morning with RN around 31f. -u/a negative for infection -DVT prophylaxis:  sq heparin   SLeontine Locket PA-C [facility] [phone]/05/2021 7:38 AM  VASCULAR STAFF ADDENDUM: I have independently interviewed and examined the patient. I agree with the above.  Continues to struggle with significant edema in the left lower extremity.  We will place a TED hose.  Bypass palpable, excellent signal distally. PT recommending SNF.  Both patient and wife okay with this plan.   JCassandria Santee MD [facility]Phone Number: [phone]/05/2021 2:16 PM

## 2022-02-02 NOTE — Progress Notes (Signed)
Pt ambulated the whole hall 444f with the Front wheel walker with his wife Pt tolerated well
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
## 2022-02-02 NOTE — Progress Notes (Signed)
Pt ambulated the entire hall back and forth with this RN Pt then used IS and reached max inhale for 10 reps

## 2022-02-02 NOTE — Progress Notes (Addendum)
The nurse assisted the patient to ambulate to the bathroom and in the hallway with a walker this morning.  He ambulated around 300 ft. Says it's getting a little easier to move his left leg this AM. Currently resting in bed with his left leg elevated. Oxycodone 10 mg was given for pain.  Will continue to monitor.  Lupita Dawn, RN

## 2022-02-02 NOTE — Anesthesia Postprocedure Evaluation (Signed)
Anesthesia Post Note  Patient: [name]  Procedure(s) Performed: LEFT COMMON FEMORAL BYPASS TO ANTERIOR TIBIAL ARTERY (Left: Leg Lower) HARVEST OF LEFT LEG GREAT SAPHENOUS VEIN (Left: Leg Lower)     Patient location during evaluation: PACU Anesthesia Type: General Level of consciousness: awake and alert Pain management: pain level controlled Vital Signs Assessment: post-procedure vital signs reviewed and stable Respiratory status: spontaneous breathing, nonlabored ventilation, respiratory function stable and patient connected to nasal cannula oxygen Cardiovascular status: blood pressure returned to baseline and stable Postop Assessment: no apparent nausea or vomiting Anesthetic complications: no  No notable events documented.  Last Vitals:  Vitals:   02/02/22 0338 02/02/22 0723  BP: 120/67 103/68  Pulse: 83 69  Resp: 14 12  Temp: 37.1 C 37.2 C  SpO2: 94% 95%    Last Pain:  Vitals:   02/02/22 0723  TempSrc: Oral  PainSc: 4    Pain Goal: Patients Stated Pain Goal: 0 (02/02/22 0342)                 Razan Siler
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
## 2022-02-02 NOTE — Progress Notes (Addendum)
Pt ambulated 470 feet of the entire hall with this RN The pt tried to walk on their own without the walker and was able to for 5 feet before needing the walker for extra support  Pt hit max goal on IS and is resting in bed with the call bell  Wife educated on how to ambulate with patient and walk

## 2022-02-03 ENCOUNTER — Ambulatory Visit: Payer: Self-pay | Admitting: Nurse Practitioner

## 2022-02-03 DIAGNOSIS — M86172 Other acute osteomyelitis, left ankle and foot: Secondary | ICD-10-CM

## 2022-02-03 LAB — GLUCOSE, CAPILLARY
Glucose-Capillary: 148 mg/dL — ABNORMAL HIGH (ref 70–99)
Glucose-Capillary: 222 mg/dL — ABNORMAL HIGH (ref 70–99)
Glucose-Capillary: 185 mg/dL — ABNORMAL HIGH (ref 70–99)
Glucose-Capillary: 147 mg/dL — ABNORMAL HIGH (ref 70–99)

## 2022-02-03 MED ORDER — CEFAZOLIN SODIUM-DEXTROSE 2-4 GM/100ML-% IV SOLN
2.0000 g | INTRAVENOUS | Status: AC
  Administered 2022-02-04: 2 g via INTRAVENOUS
  Filled 2022-02-03: qty 100

## 2022-02-03 NOTE — H&P (View-Only) (Signed)
PODIATRY CONSULTATION  NAME [name]. MRN [id number] DOB Oct 14, 1950 DOA 01/29/2022   Reason for consult: No chief complaint on file.   Consulting physician: Cassandria Santee MD, VVS  History of present illness: 71 y.o. male PMHx critical limb ischemia LLE recently underwent left superficial femoral-anterior tibial artery bypass.  Patient has been optimized from a vascular standpoint.  Left second toe continues to demonstrate ischemia with necrosis and gangrene of the toe.  Podiatry was consulted today to evaluate patient and plan for amputation of the toe prior to discharge  Past Medical History:  Diagnosis Date   Allergy    seasonal   Arthritis    " all over me- back, hips, knees, hands, everywhere "   Cancer [facility])    Prostate cancer age 21; Lupron treatments followed by prostatectomy.   Cataract    left removed, forming right   Chronic constipation    x 23 yrs after prostate surgery   Diabetes mellitus without complication (HCC)    Diabetic retinal damage of both eyes ([place])    gets shots every month   GERD (gastroesophageal reflux disease)    Hyperlipidemia    Hypertension    Myocardial infarction [facility])    age 89; no stenting.   OSA (obstructive sleep apnea)    non-compliant with CPAP.   Sleep apnea    no cpap       Latest Ref Rng & Units 01/30/2022    4:50 AM 01/29/2022    8:30 AM 01/17/2022    7:11 AM  CBC  WBC 4.0 - 10.5 K/uL 8.7  11.4    Hemoglobin 13.0 - 17.0 g/dL 11.7  14.1  14.6   Hematocrit 39.0 - 52.0 % 33.4  40.8  43.0   Platelets 150 - 400 K/uL 175  216         Latest Ref Rng & Units 01/30/2022    4:50 AM 01/29/2022    8:30 AM 01/17/2022    7:11 AM  BMP  Glucose 70 - 99 mg/dL 109  118  136   BUN 8 - 23 mg/dL '8  13  14   '$ Creatinine 0.61 - 1.24 mg/dL 0.83  0.95  0.90   Sodium 135 - 145 mmol/L 137  134  141   Potassium 3.5 - 5.1 mmol/L 3.6  3.7  4.5   Chloride 98 - 111 mmol/L 102  100  102   CO2 22 - 32 mmol/L 24  24    Calcium 8.9 -  10.3 mg/dL 8.0  9.0      LT foot 02/03/2022  Physical Exam: General: The patient is alert and oriented x3 in no acute distress.   Dermatology: Ischemia noted encompassing the majority of the second toe with eschar through the central portion of the toe.  No drainage.  No malodor.  Vascular: s/p   1.  Harvest of left leg great saphenous vein 2.  Left common femoral artery to anterior tibial artery bypass with ipsilateral nonreversed great saphenous vein  Neurological: Grossly intact via light touch  Musculoskeletal Exam: No structural deformity noted.  No prior amputations    ASSESSMENT/PLAN OF CARE Ischemia second digit left foot -Discussed patient with Dr. Virl Cagey.  Patient optimized from a vascular standpoint -Will plan for left second toe amputation tomorrow, 02/04/2022 -Preoperative orders placed. NPO after midnight -Podiatry will follow    Thank you for the consult.  Please contact me via secure chat with any questions or concerns.  Edrick Kins, DPM [facility]  Dr. Edrick Kins, DPM    [address] [place], [place] [address]                Office [phone]  Fax [phone]

## 2022-02-03 NOTE — TOC Progression Note (Signed)
Transition of Care (TOC) - Progression Note    Patient Details  Name: Kamdin Follett. MRN: [id number] Date of Birth: 10/18/1950  Transition of Care [facility]) CM/SW [place], [place] Phone Number: 02/03/2022, 3:11 PM  Clinical Narrative:     CSW contacting pt's Wylie Hail [phone]) and verbally provided SNF bed offers and medicare star ratings. Theodosia Paling chooses [facility]. TOC will follow and initiate auth closer to DC.   CSW spoke with Destiny w/ [facility] admissions [phone]) who confirmed bed offer.     Expected Discharge Plan: [place] Barriers to Discharge: Continued Medical Work up  Expected Discharge Plan and Services Expected Discharge Plan: [place] In-house Referral: Clinical Social Work     Living arrangements for the past 2 months: Single Family Home                 DME Arranged: Bedside commode, Walker rolling DME Agency: [employer] Date DME Agency Contacted: 01/31/22 Time DME Agency Contacted: 1500 Representative spoke with at DME Agency: Erasmo Downer             Social Determinants of Health ([place]) Interventions    Readmission Risk Interventions     No data to display

## 2022-02-03 NOTE — Consult Note (Addendum)
PODIATRY CONSULTATION  NAME [name]. MRN [id number] DOB [birth year] DOA 01/29/2022   Reason for consult: No chief complaint on file.   Consulting physician: Cassandria Santee MD, VVS  History of present illness: 71 y.o. male PMHx critical limb ischemia LLE recently underwent left superficial femoral-anterior tibial artery bypass.  Patient has been optimized from a vascular standpoint.  Left second toe continues to demonstrate ischemia with necrosis and gangrene of the toe.  Podiatry was consulted today to evaluate patient and plan for amputation of the toe prior to discharge  Past Medical History:  Diagnosis Date   Allergy    seasonal   Arthritis    " all over me- back, hips, knees, hands, everywhere "   Cancer [facility])    Prostate cancer age 43; Lupron treatments followed by prostatectomy.   Cataract    left removed, forming right   Chronic constipation    x 23 yrs after prostate surgery   Diabetes mellitus without complication (HCC)    Diabetic retinal damage of both eyes ([place])    gets shots every month   GERD (gastroesophageal reflux disease)    Hyperlipidemia    Hypertension    Myocardial infarction [facility])    age 38; no stenting.   OSA (obstructive sleep apnea)    non-compliant with CPAP.   Sleep apnea    no cpap       Latest Ref Rng & Units 01/30/2022    4:50 AM 01/29/2022    8:30 AM 01/17/2022    7:11 AM  CBC  WBC 4.0 - 10.5 K/uL 8.7  11.4    Hemoglobin 13.0 - 17.0 g/dL 11.7  14.1  14.6   Hematocrit 39.0 - 52.0 % 33.4  40.8  43.0   Platelets 150 - 400 K/uL 175  216         Latest Ref Rng & Units 01/30/2022    4:50 AM 01/29/2022    8:30 AM 01/17/2022    7:11 AM  BMP  Glucose 70 - 99 mg/dL 109  118  136   BUN 8 - 23 mg/dL '8  13  14   '$ Creatinine 0.61 - 1.24 mg/dL 0.83  0.95  0.90   Sodium 135 - 145 mmol/L 137  134  141   Potassium 3.5 - 5.1 mmol/L 3.6  3.7  4.5   Chloride 98 - 111 mmol/L 102  100  102   CO2 22 - 32 mmol/L 24  24    Calcium 8.9 -  10.3 mg/dL 8.0  9.0      LT foot 02/03/2022  Physical Exam: General: The patient is alert and oriented x3 in no acute distress.   Dermatology: Ischemia noted encompassing the majority of the second toe with eschar through the central portion of the toe.  No drainage.  No malodor.  Vascular: s/p   1.  Harvest of left leg great saphenous vein 2.  Left common femoral artery to anterior tibial artery bypass with ipsilateral nonreversed great saphenous vein  Neurological: Grossly intact via light touch  Musculoskeletal Exam: No structural deformity noted.  No prior amputations    ASSESSMENT/PLAN OF CARE Ischemia second digit left foot -Discussed patient with Dr. Virl Cagey.  Patient optimized from a vascular standpoint -Will plan for left second toe amputation tomorrow, 02/04/2022 -Preoperative orders placed. NPO after midnight -Podiatry will follow    Thank you for the consult.  Please contact me via secure chat with any questions or concerns.  Edrick Kins, DPM [facility]  Dr. Edrick Kins, DPM    [address], [place] [address]                Office [phone]  Fax [phone]

## 2022-02-03 NOTE — Progress Notes (Addendum)
[facility] [place]  Subjective  - no new complaints   Objective 124/64 84 98.2 F (36.8 C) (Oral) 20 93%  Intake/Output Summary (Last 24 hours) at 02/03/2022 0732 Last data filed at 02/03/2022 0449 Gross per 24 hour  Intake 540 ml  Output 1500 ml  Net -960 ml    Palpable Dp and bypass pulse Left second toe with tissue loss no change.  Will allow to demarcate Lungs non labored breathing Alert and oriented  Assessment/Planning: POD # 5 71 y.o. male is s/p:  1.  Harvest of left leg great saphenous vein 2.  Left common femoral artery to anterior tibial artery bypass with ipsilateral nonreversed great saphenous vein  Palpable graft pulse and DP pulse Second toe tissue loss will allow demarcation Will ask for re eval PT  Roxy Horseman 02/03/2022 7:32 AM --  VASCULAR STAFF ADDENDUM: I have independently interviewed and examined the patient. I agree with the above.  Toe worsening, would benefit from toe amp prior to discharge. I have reached out to podiatry.  Light Ace on leg for edema control  Cassandria Santee, MD [facility] Phone Number: [phone] 02/03/2022 2:06 PM    Laboratory Lab Results: No results for input(s): "WBC", "HGB", "HCT", "PLT" in the last 72 hours. BMET No results for input(s): "NA", "K", "CL", "CO2", "GLUCOSE", "BUN", "CREATININE", "CALCIUM" in the last 72 hours.  COAG Lab Results  Component Value Date   INR 1.0 01/29/2022   INR 1.0 08/08/2017   No results found for: "PTT"

## 2022-02-03 NOTE — Progress Notes (Signed)
The patient complained the TED Hose felt like it was cutting off circulation in his left leg.  He asked for the TED hose to be taken off tonight. Nurse removed Hose. Will continue to monitor.  Lupita Dawn, RN

## 2022-02-03 NOTE — Progress Notes (Signed)
Occupational Therapy Treatment Patient Details Name: [name] MRN: [id number] DOB: 12-Oct-1950 Today's Date: 02/03/2022   History of present illness Pt is a 71 y/o male presenting on 11/29 with L second toe ulcer.  S/P L common femoral artery to anterior tibial artery bypass with ipsilateral nonreversed great saphenous vein. PMH includes: HTN, CAD, DM, prostate cancer, MI, cataracts, OSA   OT comments  [name] is making steady progress. He was able to complete bed mobility and transfers with min G this date. He continues to need significantly increased time for pain management and cues for safety. Pt stood unsupported at the sink with min G, no overt LOB but pt was unsteady. Toilet transfer and hygiene completed with heavy use of grab bar. OT to continue to follow acutely. D/c remains appropriate at the time.    Recommendations for follow up therapy are one component of a multi-disciplinary discharge planning process, led by the attending physician.  Recommendations may be updated based on patient status, additional functional criteria and insurance authorization.    Follow Up Recommendations  Skilled nursing-short term rehab (<3 hours/day)     Assistance Recommended at Discharge Frequent or constant Supervision/Assistance  Patient can return home with the following  A little help with walking and/or transfers;A lot of help with bathing/dressing/bathroom;Help with stairs or ramp for entrance;Assist for transportation;Assistance with cooking/housework   Equipment Recommendations  BSC/3in1;Other (comment)       Precautions / Restrictions Precautions Precautions: Fall Restrictions Weight Bearing Restrictions: No       Mobility Bed Mobility Overal bed mobility: Needs Assistance Bed Mobility: Supine to Sit     Supine to sit: Min guard     General bed mobility comments: increased time, heavily reliant on rails, using RLE to assist LLE get to EOB.    Transfers Overall  transfer level: Needs assistance Equipment used: Rolling walker (2 wheels) Transfers: Sit to/from Stand Sit to Stand: Min guard           General transfer comment: 2x attempts     Balance Overall balance assessment: Needs assistance Sitting-balance support: Feet supported, No upper extremity supported Sitting balance-Leahy Scale: Fair     Standing balance support: Single extremity supported, During functional activity Standing balance-Leahy Scale: Fair                             ADL either performed or assessed with clinical judgement   ADL Overall ADL's : Needs assistance/impaired     Grooming: Min guard;Standing Grooming Details (indicate cue type and reason): mildly unsteady when standing without UE support at the sink                 Toilet Transfer: Min guard;Ambulation;Regular Toilet;Rolling walker (2 wheels) Toilet Transfer Details (indicate cue type and reason): increased time needed Toileting- Clothing Manipulation and Hygiene: Supervision/safety;Sitting/lateral lean       Functional mobility during ADLs: Min guard;Rolling walker (2 wheels) General ADL Comments: increased time needed for all tasks for pain management    Extremity/Trunk Assessment Upper Extremity Assessment Upper Extremity Assessment: Overall WFL for tasks assessed   Lower Extremity Assessment Lower Extremity Assessment: Defer to PT evaluation        Vision   Vision Assessment?: No apparent visual deficits   Perception Perception Perception: Within Functional Limits   Praxis Praxis Praxis: Intact    Cognition Arousal/Alertness: Awake/alert Behavior During Therapy: Flat affect Overall Cognitive Status: Within Functional Limits for  tasks assessed                                 General Comments: slow processing but overall WFL for basic tasks assessed              General Comments VSS on RA, wife present    Pertinent Vitals/ Pain        Pain Assessment Pain Assessment: Faces Faces Pain Scale: Hurts even more Pain Location: L LE Pain Descriptors / Indicators: Sharp, Shooting, Grimacing, Operative site guarding Pain Intervention(s): Limited activity within patient's tolerance, Monitored during session   Frequency  Min 2X/week        Progress Toward Goals  OT Goals(current goals can now be found in the care plan section)  Progress towards OT goals: Progressing toward goals  Acute Rehab OT Goals Patient Stated Goal: less pain OT Goal Formulation: With patient Time For Goal Achievement: 02/13/22 Potential to Achieve Goals: Good ADL Goals Pt Will Perform Grooming: with modified independence;standing Pt Will Perform Lower Body Dressing: with modified independence;sit to/from stand;with adaptive equipment Pt Will Transfer to Toilet: with modified independence;ambulating Pt Will Perform Toileting - Clothing Manipulation and hygiene: with modified independence;sit to/from stand Pt Will Perform Tub/Shower Transfer: Tub transfer;with supervision;3 in 1;ambulating;rolling walker  Plan Discharge plan needs to be updated;Frequency remains appropriate       AM-PAC OT "6 Clicks" Daily Activity     Outcome Measure   Help from another person eating meals?: None Help from another person taking care of personal grooming?: A Little Help from another person toileting, which includes using toliet, bedpan, or urinal?: A Little Help from another person bathing (including washing, rinsing, drying)?: A Lot Help from another person to put on and taking off regular upper body clothing?: A Little Help from another person to put on and taking off regular lower body clothing?: A Lot 6 Click Score: 17    End of Session Equipment Utilized During Treatment: Rolling walker (2 wheels)  OT Visit Diagnosis: Other abnormalities of gait and mobility (R26.89);Pain   Activity Tolerance Patient tolerated treatment well   Patient Left in  bed;with call bell/phone within reach;with bed alarm set   Nurse Communication Mobility status        Time: 1594-5859 OT Time Calculation (min): 13 min  Charges: OT General Charges $OT Visit: 1 Visit OT Treatments $Self Care/Home Management : 8-22 mins    Elliot Cousin 02/03/2022, 1:38 PM

## 2022-02-03 NOTE — Progress Notes (Signed)
Physical Therapy Treatment Patient Details Name: [name] MRN: [id number] DOB: Sep 01, 1950 Today's Date: 02/03/2022   History of Present Illness Pt is a 71 y/o male presenting on 11/29 with L second toe ulcer.  S/P L common femoral artery to anterior tibial artery bypass with ipsilateral nonreversed great saphenous vein. PMH includes: HTN, CAD, DM, prostate cancer, MI, cataracts, OSA    PT Comments    Patient progressing slowly towards PT goals. Session focused on gait training and functional mobility. Continues to report increased pain in LLE esp with mobility and weightbearing. Difficulty extending LLE due to tightness/pain. Improved ambulation distance today with increased time and cues for safe RW use.  Very slow gait speed putting pt at increased risk for falls. Continues to require Min A for standing and Min guard assist for ambulation. Encouraged walking 3 times daily with assist and use of RW. Continues to be appropriate for SNF. Wil follow.   Recommendations for follow up therapy are one component of a multi-disciplinary discharge planning process, led by the attending physician.  Recommendations may be updated based on patient status, additional functional criteria and insurance authorization.  Follow Up Recommendations  Skilled nursing-short term rehab (<3 hours/day) Can patient physically be transported by private vehicle: Yes   Assistance Recommended at Discharge Intermittent Supervision/Assistance  Patient can return home with the following Assistance with cooking/housework;Assist for transportation;Help with stairs or ramp for entrance;A little help with walking and/or transfers;A little help with bathing/dressing/bathroom;Direct supervision/assist for medications management   Equipment Recommendations  Rolling walker (2 wheels)    Recommendations for Other Services       Precautions / Restrictions Precautions Precautions: Fall Restrictions Weight Bearing  Restrictions: No     Mobility  Bed Mobility Overal bed mobility: Needs Assistance Bed Mobility: Supine to Sit     Supine to sit: Min guard, HOB elevated     General bed mobility comments: increased time, heavily reliant on rails, using RLE to assist LLE get to EOB.    Transfers Overall transfer level: Needs assistance Equipment used: Rolling walker (2 wheels) Transfers: Sit to/from Stand Sit to Stand: Min assist           General transfer comment: Min A to steady in standing,. stood from EOB x1, from toilet x1 with cues for hand placement/technique and bars in bathroom.    Ambulation/Gait Ambulation/Gait assistance: Min guard Gait Distance (Feet): 110 Feet Assistive device: Rolling walker (2 wheels) Gait Pattern/deviations: Step-to pattern, Step-through pattern, Wide base of support, Trunk flexed, Antalgic, Decreased stance time - left Gait velocity: .2 ft/sec Gait velocity interpretation: <1.8 ft/sec, indicate of risk for recurrent falls   General Gait Details: Slow, mildly unsteady gait with cues to roll RW instead of pick it up and for RW proximity. Flexed trunk and increased knee flexion LLE.   Stairs             Wheelchair Mobility    Modified Rankin (Stroke Patients Only)       Balance Overall balance assessment: Needs assistance Sitting-balance support: Feet supported, No upper extremity supported Sitting balance-Leahy Scale: Fair     Standing balance support: During functional activity Standing balance-Leahy Scale: Poor Standing balance comment: UE support needed for standing due to pain L LE, able to take 1 UE off RW statically                            Cognition Arousal/Alertness: Lethargic Behavior During  Therapy: WFL for tasks assessed/performed Overall Cognitive Status: No family/caregiver present to determine baseline cognitive functioning                                 General Comments: pt with slow  processing and decreased problem solving, min cueing for safety and attention to task- likely close to baseline. Stating "earlier in the hospital i got up and walked." Also stating he tried walking without RW yesterday despite it being unsafe.        Exercises      General Comments General comments (skin integrity, edema, etc.): VSS on RA.      Pertinent Vitals/Pain Pain Assessment Pain Assessment: 0-10 Pain Score: 8  Pain Location: L LE Pain Descriptors / Indicators: Sharp, Shooting, Grimacing, Operative site guarding Pain Intervention(s): Monitored during session, Repositioned    Home Living                          Prior Function            PT Goals (current goals can now be found in the care plan section) Progress towards PT goals: Progressing toward goals    Frequency    Min 3X/week      PT Plan Current plan remains appropriate    Co-evaluation              AM-PAC PT "6 Clicks" Mobility   Outcome Measure  Help needed turning from your back to your side while in a flat bed without using bedrails?: A Little Help needed moving from lying on your back to sitting on the side of a flat bed without using bedrails?: A Little Help needed moving to and from a bed to a chair (including a wheelchair)?: A Little Help needed standing up from a chair using your arms (e.g., wheelchair or bedside chair)?: A Little Help needed to walk in hospital room?: A Little Help needed climbing 3-5 steps with a railing? : A Lot 6 Click Score: 17    End of Session Equipment Utilized During Treatment: Gait belt Activity Tolerance: Patient tolerated treatment well;Patient limited by pain Patient left: in chair;with call bell/phone within reach;with nursing/sitter in room Nurse Communication: Mobility status;Patient requests pain meds PT Visit Diagnosis: Difficulty in walking, not elsewhere classified (R26.2);Pain Pain - Right/Left: Left Pain - part of body: Leg      Time: 0826-0905 PT Time Calculation (min) (ACUTE ONLY): 39 min  Charges:  $Gait Training: 23-37 mins $Therapeutic Activity: 8-22 mins                     Marisa Severin, PT, DPT Acute Rehabilitation Services Secure chat preferred Office [place] 02/03/2022, 11:01 AM

## 2022-02-04 ENCOUNTER — Encounter (HOSPITAL_COMMUNITY): Payer: Self-pay | Admitting: Vascular Surgery

## 2022-02-04 ENCOUNTER — Inpatient Hospital Stay (HOSPITAL_COMMUNITY): Payer: Medicare HMO

## 2022-02-04 ENCOUNTER — Inpatient Hospital Stay (HOSPITAL_COMMUNITY): Payer: Medicare HMO | Admitting: Anesthesiology

## 2022-02-04 ENCOUNTER — Other Ambulatory Visit: Payer: Self-pay

## 2022-02-04 ENCOUNTER — Encounter (HOSPITAL_COMMUNITY)
Admission: RE | Disposition: A | Discharge status: Skilled Nursing Facility | Payer: Self-pay | Source: Home / Self Care | Attending: Vascular Surgery

## 2022-02-04 DIAGNOSIS — I252 Old myocardial infarction: Secondary | ICD-10-CM

## 2022-02-04 DIAGNOSIS — E1152 Type 2 diabetes mellitus with diabetic peripheral angiopathy with gangrene: Secondary | ICD-10-CM

## 2022-02-04 DIAGNOSIS — Z7984 Long term (current) use of oral hypoglycemic drugs: Secondary | ICD-10-CM

## 2022-02-04 DIAGNOSIS — I96 Gangrene, not elsewhere classified: Secondary | ICD-10-CM

## 2022-02-04 DIAGNOSIS — I10 Essential (primary) hypertension: Secondary | ICD-10-CM

## 2022-02-04 DIAGNOSIS — G4730 Sleep apnea, unspecified: Secondary | ICD-10-CM

## 2022-02-04 HISTORY — PX: AMPUTATION TOE: SHX6595

## 2022-02-04 LAB — GLUCOSE, CAPILLARY
Glucose-Capillary: 151 mg/dL — ABNORMAL HIGH (ref 70–99)
Glucose-Capillary: 137 mg/dL — ABNORMAL HIGH (ref 70–99)
Glucose-Capillary: 112 mg/dL — ABNORMAL HIGH (ref 70–99)
Glucose-Capillary: 124 mg/dL — ABNORMAL HIGH (ref 70–99)
Glucose-Capillary: 138 mg/dL — ABNORMAL HIGH (ref 70–99)
Glucose-Capillary: 210 mg/dL — ABNORMAL HIGH (ref 70–99)

## 2022-02-04 SURGERY — AMPUTATION, TOE
Anesthesia: Monitor Anesthesia Care | Site: Toe | Laterality: Left

## 2022-02-04 MED ORDER — METRONIDAZOLE 500 MG PO TABS
500.0000 mg | ORAL_TABLET | Freq: Two times a day (BID) | ORAL | Status: DC
  Administered 2022-02-05: 500 mg via ORAL
  Filled 2022-02-04: qty 1

## 2022-02-04 MED ORDER — 0.9 % SODIUM CHLORIDE (POUR BTL) OPTIME
TOPICAL | Status: DC | PRN
  Administered 2022-02-04: 1000 mL

## 2022-02-04 MED ORDER — MIDAZOLAM HCL 5 MG/5ML IJ SOLN
INTRAMUSCULAR | Status: DC | PRN
  Administered 2022-02-04: 2 mg via INTRAVENOUS

## 2022-02-04 MED ORDER — LIDOCAINE HCL 2 % IJ SOLN
INTRAMUSCULAR | Status: DC | PRN
  Administered 2022-02-04: 10 mL

## 2022-02-04 MED ORDER — CHLORHEXIDINE GLUCONATE 0.12 % MT SOLN
OROMUCOSAL | Status: AC
  Administered 2022-02-04: 15 mL via OROMUCOSAL
  Filled 2022-02-04: qty 15

## 2022-02-04 MED ORDER — ORAL CARE MOUTH RINSE: 15.0000 mL | Freq: Once | OROMUCOSAL | Status: AC

## 2022-02-04 MED ORDER — LACTATED RINGERS IV SOLN: INTRAVENOUS | Status: DC

## 2022-02-04 MED ORDER — FENTANYL CITRATE (PF) 100 MCG/2ML IJ SOLN: 25.0000 ug | INTRAMUSCULAR | Status: DC | PRN

## 2022-02-04 MED ORDER — FENTANYL CITRATE (PF) 250 MCG/5ML IJ SOLN
INTRAMUSCULAR | Status: AC
  Filled 2022-02-04: qty 5

## 2022-02-04 MED ORDER — LIDOCAINE HCL 2 % IJ SOLN
INTRAMUSCULAR | Status: AC
  Filled 2022-02-04: qty 20

## 2022-02-04 MED ORDER — CHLORHEXIDINE GLUCONATE 0.12 % MT SOLN: 15.0000 mL | Freq: Once | OROMUCOSAL | Status: AC

## 2022-02-04 MED ORDER — BUPIVACAINE HCL 0.5 % IJ SOLN
INTRAMUSCULAR | Status: AC
  Filled 2022-02-04: qty 1

## 2022-02-04 MED ORDER — BUPIVACAINE HCL (PF) 0.5 % IJ SOLN
INTRAMUSCULAR | Status: DC | PRN
  Administered 2022-02-04: 10 mL

## 2022-02-04 MED ORDER — PROPOFOL 10 MG/ML IV BOLUS
INTRAVENOUS | Status: DC | PRN
  Administered 2022-02-04 (×2): 30 mg via INTRAVENOUS
  Administered 2022-02-04: 20 mg via INTRAVENOUS

## 2022-02-04 MED ORDER — ONDANSETRON HCL 4 MG/2ML IJ SOLN: 4.0000 mg | Freq: Once | INTRAMUSCULAR | Status: DC | PRN

## 2022-02-04 MED ORDER — ONDANSETRON HCL 4 MG/2ML IJ SOLN
INTRAMUSCULAR | Status: DC | PRN
  Administered 2022-02-04: 4 mg via INTRAVENOUS

## 2022-02-04 MED ORDER — PROPOFOL 500 MG/50ML IV EMUL
INTRAVENOUS | Status: DC | PRN
  Administered 2022-02-04: 75 ug/kg/min via INTRAVENOUS

## 2022-02-04 MED ORDER — CEFAZOLIN SODIUM-DEXTROSE 1-4 GM/50ML-% IV SOLN
1.0000 g | Freq: Three times a day (TID) | INTRAVENOUS | Status: DC
  Administered 2022-02-04 – 2022-02-05 (×3): 1 g via INTRAVENOUS
  Filled 2022-02-04 (×4): qty 50

## 2022-02-04 MED ORDER — OXYCODONE HCL 5 MG PO TABS: 5.0000 mg | ORAL_TABLET | Freq: Once | ORAL | Status: DC | PRN

## 2022-02-04 MED ORDER — PROPOFOL 10 MG/ML IV BOLUS
INTRAVENOUS | Status: AC
  Filled 2022-02-04: qty 20

## 2022-02-04 MED ORDER — MIDAZOLAM HCL 2 MG/2ML IJ SOLN
INTRAMUSCULAR | Status: AC
  Filled 2022-02-04: qty 2

## 2022-02-04 MED ORDER — ACETAMINOPHEN 500 MG PO TABS
1000.0000 mg | ORAL_TABLET | Freq: Once | ORAL | Status: AC
  Administered 2022-02-04: 1000 mg via ORAL
  Filled 2022-02-04: qty 2

## 2022-02-04 MED ORDER — OXYCODONE HCL 5 MG/5ML PO SOLN: 5.0000 mg | Freq: Once | ORAL | Status: DC | PRN

## 2022-02-04 MED ORDER — INSULIN ASPART 100 UNIT/ML IJ SOLN: 0.0000 [IU] | INTRAMUSCULAR | Status: DC | PRN

## 2022-02-04 SURGICAL SUPPLY — 33 items
BAG COUNTER SPONGE SURGICOUNT (BAG) ×1 IMPLANT
BNDG ELASTIC 3X5.8 VLCR STR LF (GAUZE/BANDAGES/DRESSINGS) ×1 IMPLANT
BNDG ELASTIC 4X5.8 VLCR NS LF (GAUZE/BANDAGES/DRESSINGS) IMPLANT
BNDG ELASTIC 6X5.8 VLCR STR LF (GAUZE/BANDAGES/DRESSINGS) IMPLANT
BNDG ESMARK 4X9 LF (GAUZE/BANDAGES/DRESSINGS) ×1 IMPLANT
BNDG GAUZE DERMACEA FLUFF 4 (GAUZE/BANDAGES/DRESSINGS) ×1 IMPLANT
CUFF TOURN SGL QUICK 18X4 (TOURNIQUET CUFF) IMPLANT
DRSG EMULSION OIL 3X3 NADH (GAUZE/BANDAGES/DRESSINGS) ×1 IMPLANT
DURAPREP 26ML APPLICATOR (WOUND CARE) ×1 IMPLANT
ELECT REM PT RETURN 9FT ADLT (ELECTROSURGICAL) ×1
ELECTRODE REM PT RTRN 9FT ADLT (ELECTROSURGICAL) ×1 IMPLANT
GAUZE PAD ABD 8X10 STRL (GAUZE/BANDAGES/DRESSINGS) IMPLANT
GAUZE SPONGE 4X4 12PLY STRL (GAUZE/BANDAGES/DRESSINGS) ×1 IMPLANT
GAUZE XEROFORM 5X9 LF (GAUZE/BANDAGES/DRESSINGS) IMPLANT
GOWN STRL REUS W/ TWL LRG LVL3 (GOWN DISPOSABLE) ×1 IMPLANT
GOWN STRL REUS W/ TWL XL LVL3 (GOWN DISPOSABLE) ×1 IMPLANT
GOWN STRL REUS W/TWL LRG LVL3 (GOWN DISPOSABLE) ×2
GOWN STRL REUS W/TWL XL LVL3 (GOWN DISPOSABLE)
KIT BASIN OR (CUSTOM PROCEDURE TRAY) ×1 IMPLANT
NDL HYPO 25X1 1.5 SAFETY (NEEDLE) ×1 IMPLANT
NEEDLE HYPO 25X1 1.5 SAFETY (NEEDLE) ×1 IMPLANT
NS IRRIG 1000ML POUR BTL (IV SOLUTION) IMPLANT
PACK ORTHO EXTREMITY (CUSTOM PROCEDURE TRAY) ×1 IMPLANT
SUCTION FRAZIER HANDLE 10FR (MISCELLANEOUS) ×1
SUCTION TUBE FRAZIER 10FR DISP (MISCELLANEOUS) ×1 IMPLANT
SUT ETHILON 3 0 PS 1 (SUTURE) IMPLANT
SUT PROLENE 3 0 PS 2 (SUTURE) ×1 IMPLANT
SWAB COLLECTION DEVICE MRSA (MISCELLANEOUS) IMPLANT
SWAB CULTURE ESWAB REG 1ML (MISCELLANEOUS) IMPLANT
SYR 10ML LL (SYRINGE) IMPLANT
TUBE CONNECTING 12X1/4 (SUCTIONS) ×1 IMPLANT
UNDERPAD 30X36 HEAVY ABSORB (UNDERPADS AND DIAPERS) ×1 IMPLANT
YANKAUER SUCT BULB TIP NO VENT (SUCTIONS) IMPLANT

## 2022-02-04 NOTE — Progress Notes (Signed)
Patient complaining of pain in groin while moving around. This RN noticed groin hard to touch and red. Paged MD and stated groin had been hard when palpated but will continue to watch the redness- antibiotics have been started. Charge nurse, Gaspar Bidding also assessed groin site with RN. Will continue to monitor.  [name]

## 2022-02-04 NOTE — Transfer of Care (Signed)
Immediate Anesthesia Transfer of Care Note  Patient: Juliano Mceachin.  Procedure(s) Performed: AMPUTATION  SECOND TOE (Left: Toe)  Patient Location: PACU  Anesthesia Type:MAC  Level of Consciousness: awake, alert , and oriented  Airway & Oxygen Therapy: Patient Spontanous Breathing  Post-op Assessment: Report given to RN, Post -op Vital signs reviewed and stable, and Patient moving all extremities X 4  Post vital signs: Reviewed and stable  Last Vitals:  Vitals Value Taken Time  BP 116/67 02/04/22 1920  Temp 37.8 C 02/04/22 1920  Pulse 78 02/04/22 1929  Resp 19 02/04/22 1929  SpO2 93 % 02/04/22 1929  Vitals shown include unvalidated device data.  Last Pain:  Vitals:   02/04/22 1920  TempSrc:   PainSc: 0-No pain      Patients Stated Pain Goal: 0 (53/66/44 0347)  Complications: No notable events documented.

## 2022-02-04 NOTE — TOC Progression Note (Signed)
Transition of Care (TOC) - Progression Note    Patient Details  Name: [name] MRN: [id number] Date of Birth: [birth year]  Transition of Care [facility]) CM/SW [place], [place] Phone Number: 02/04/2022, 11:18 AM  Clinical Narrative:     Started Lexicographer # [phone]. Insurance authorization is pending.   Thurmond Butts, MSW, LCSW Clinical Social Worker    Expected Discharge Plan: Skilled Nursing Facility Barriers to Discharge: Continued Medical Work up  Expected Discharge Plan and Services Expected Discharge Plan: [place] In-house Referral: Clinical Social Work     Living arrangements for the past 2 months: Single Family Home                 DME Arranged: Bedside commode, Walker rolling DME Agency: [employer] Date DME Agency Contacted: 01/31/22 Time DME Agency Contacted: 1500 Representative spoke with at DME Agency: Erasmo Downer             Social Determinants of Health ([place]) Interventions    Readmission Risk Interventions     No data to display

## 2022-02-04 NOTE — Brief Op Note (Signed)
02/04/2022  7:15 PM  PATIENT:  [name]  71 y.o. male  PRE-OPERATIVE DIAGNOSIS:  gangrene second toe  POST-OPERATIVE DIAGNOSIS:  gangrene second toe  PROCEDURE:  Procedure(s): AMPUTATION  SECOND TOE (Left)  SURGEON:  Surgeon(s) and Role:    * Trula Slade, DPM - Primary  PHYSICIAN ASSISTANT:   ASSISTANTS: none   ANESTHESIA:   MAC  EBL:  minimal   BLOOD ADMINISTERED:none  DRAINS: none   LOCAL MEDICATIONS USED:  MARCAINE   , BUPIVICAINE , and Amount: 20 ml  SPECIMEN:  Source of Specimen:  toe for pathology, wound culture  DISPOSITION OF SPECIMEN:  PATHOLOGY  COUNTS:  YES  TOURNIQUET:  * No tourniquets in log *  DICTATION: .Dragon Dictation  PLAN OF CARE: Admit to inpatient   PATIENT DISPOSITION:  PACU - hemodynamically stable.   Delay start of Pharmacological VTE agent (>24hrs) due to surgical blood loss or risk of bleeding: no  Intraoperative findings: S/p 2nd toe amputation. Metatarsal appeared viable, no proximal signs of infection at this time. Minimal bleeding noted. He has a pre-ulcerative area on the medial 3rd toe where it was rubbing that needs to be watched.

## 2022-02-04 NOTE — Anesthesia Procedure Notes (Signed)
Procedure Name: MAC Date/Time: 02/04/2022 6:54 PM  Performed by: Rande Brunt, CRNAPre-anesthesia Checklist: Patient identified, Emergency Drugs available, Suction available and Patient being monitored Patient Re-evaluated:Patient Re-evaluated prior to induction Oxygen Delivery Method: Simple face mask Preoxygenation: Pre-oxygenation with 100% oxygen Induction Type: IV induction Placement Confirmation: positive ETCO2 and CO2 detector Dental Injury: Teeth and Oropharynx as per pre-operative assessment

## 2022-02-04 NOTE — Progress Notes (Addendum)
[facility] [place]  Subjective  - No new complaitns.   Objective 127/66 72 98.4 F (36.9 C) (Oral) 16 96%  Intake/Output Summary (Last 24 hours) at 02/04/2022 0175 Last data filed at 02/04/2022 0203 Gross per 24 hour  Intake 117 ml  Output 875 ml  Net -758 ml   Left DP and bypass pulse palpable Left LE incision healing well, ace compression in place left LE Second toe with tissue loss Lungs non labored breathing    Assessment/Planning: POD # 6 71 y.o. male is s/p:  1.  Harvest of left leg great saphenous vein 2.  Left common femoral artery to anterior tibial artery bypass with ipsilateral nonreversed great saphenous vein   Palpable graft pulse and DP pulse Improved in flow Second toe tissue DPM plans for amputation today.  He is NPO. Will ask for re eval PT    Roxy Horseman 02/04/2022 7:33 AM --  Laboratory Lab Results: No results for input(s): "WBC", "HGB", "HCT", "PLT" in the last 72 hours. BMET No results for input(s): "NA", "K", "CL", "CO2", "GLUCOSE", "BUN", "CREATININE", "CALCIUM" in the last 72 hours.  COAG Lab Results  Component Value Date   INR 1.0 01/29/2022   INR 1.0 08/08/2017   No results found for: "PTT"  I have seen and evaluated the patient. I agree with the PA note as documented above.  Status post left common femoral to anterior tibial bypass.  Bypass graft has an excellent pulse.  There is a palpable DP pulse in the foot.  I changed his dressings with a gentle Ace wrap for swelling.  Plan left second toe amputation today with podiatry and he is NPO.  Aspirin statin for risk reduction.  I will start IV Ancef as I think he has some cellulitis in his left groin and also with a worsening toe wound.  Marty Heck, MD [facility] [place] Office: [phone]

## 2022-02-04 NOTE — Anesthesia Preprocedure Evaluation (Addendum)
Anesthesia Evaluation  Patient identified by MRN, date of birth, ID band Patient awake    Reviewed: Allergy & Precautions, NPO status , Patient's Chart, lab work & pertinent test results  History of Anesthesia Complications Negative for: history of anesthetic complications  Airway Mallampati: II  TM Distance: >3 FB Neck ROM: Full    Dental  (+) Edentulous Upper, Edentulous Lower   Pulmonary sleep apnea (noncompliuant)    Pulmonary exam normal        Cardiovascular hypertension, Pt. on medications + Past MI and + Peripheral Vascular Disease  Normal cardiovascular exam+ dysrhythmias      Neuro/Psych negative neurological ROS  negative psych ROS   GI/Hepatic Neg liver ROS,GERD  Medicated and Controlled,,  Endo/Other  diabetes, Type 2, Oral Hypoglycemic Agents    Renal/GU negative Renal ROS    Prostate cancer     Musculoskeletal  (+) Arthritis ,    Abdominal   Peds  Hematology  (+) Blood dyscrasia, anemia   Anesthesia Other Findings   Reproductive/Obstetrics                             Anesthesia Physical Anesthesia Plan  ASA: 3  Anesthesia Plan: MAC   Post-op Pain Management: Tylenol PO (pre-op)*   Induction:   PONV Risk Score and Plan: 1 and Propofol infusion and Treatment may vary due to age or medical condition  Airway Management Planned: Natural Airway and Simple Face Mask  Additional Equipment: None  Intra-op Plan:   Post-operative Plan:   Informed Consent: I have reviewed the patients History and Physical, chart, labs and discussed the procedure including the risks, benefits and alternatives for the proposed anesthesia with the patient or authorized representative who has indicated his/her understanding and acceptance.       Plan Discussed with: CRNA and Anesthesiologist  Anesthesia Plan Comments:        Anesthesia Quick Evaluation

## 2022-02-04 NOTE — Interval H&P Note (Signed)
History and Physical Interval Note:  02/04/2022 5:40 PM  [name]  has presented today for surgery, with the diagnosis of gangrene second toe.  The various methods of treatment have been discussed with the patient and family. After consideration of risks, benefits and other options for treatment, the patient has consented to  Procedure(s): AMPUTATION  SECOND TOE (Left) as a surgical intervention.  The patient's history has been reviewed, patient examined, no change in status, stable for surgery.  I have reviewed the patient's chart and labs.  Questions were answered to the patient's satisfaction.     Trula Slade

## 2022-02-04 NOTE — Addendum Note (Signed)
Addendum  created 02/04/22 2040 by Annye Asa, MD   [place] recorded in [place], [place] filed

## 2022-02-04 NOTE — Plan of Care (Signed)
  Problem: Tissue Perfusion: Goal: Adequacy of tissue perfusion will improve Outcome: Not Progressing

## 2022-02-04 NOTE — Anesthesia Postprocedure Evaluation (Signed)
Anesthesia Post Note  Patient: [name]  Procedure(s) Performed: AMPUTATION  SECOND TOE (Left: Toe)     Patient location during evaluation: PACU Anesthesia Type: MAC Level of consciousness: awake and alert, patient cooperative and oriented Pain management: pain level controlled Vital Signs Assessment: post-procedure vital signs reviewed and stable Respiratory status: nonlabored ventilation, spontaneous breathing and respiratory function stable Cardiovascular status: blood pressure returned to baseline and stable Postop Assessment: no apparent nausea or vomiting Anesthetic complications: no   No notable events documented.  Last Vitals:  Vitals:   02/04/22 1955 02/04/22 2017  BP: 114/72 123/65  Pulse: 73 85  Resp: 17 20  Temp: 37.4 C 36.9 C  SpO2: 93% 93%           Hayden Kihara,E. Bellany Elbaum

## 2022-02-05 ENCOUNTER — Encounter (HOSPITAL_COMMUNITY): Payer: Self-pay | Admitting: Podiatry

## 2022-02-05 LAB — CBC
HCT: 31.3 % — ABNORMAL LOW (ref 39.0–52.0)
Hemoglobin: 11 g/dL — ABNORMAL LOW (ref 13.0–17.0)
MCH: 30.2 pg (ref 26.0–34.0)
MCHC: 35.1 g/dL (ref 30.0–36.0)
MCV: 86 fL (ref 80.0–100.0)
Platelets: 235 10*3/uL (ref 150–400)
RBC: 3.64 MIL/uL — ABNORMAL LOW (ref 4.22–5.81)
RDW: 11.6 % (ref 11.5–15.5)
WBC: 6.4 10*3/uL (ref 4.0–10.5)
nRBC: 0 % (ref 0.0–0.2)

## 2022-02-05 LAB — GLUCOSE, CAPILLARY
Glucose-Capillary: 220 mg/dL — ABNORMAL HIGH (ref 70–99)
Glucose-Capillary: 156 mg/dL — ABNORMAL HIGH (ref 70–99)
Glucose-Capillary: 145 mg/dL — ABNORMAL HIGH (ref 70–99)
Glucose-Capillary: 149 mg/dL — ABNORMAL HIGH (ref 70–99)

## 2022-02-05 MED ORDER — VANCOMYCIN HCL 2000 MG/400ML IV SOLN
2000.0000 mg | Freq: Once | INTRAVENOUS | Status: AC
  Administered 2022-02-05: 2000 mg via INTRAVENOUS
  Filled 2022-02-05: qty 400

## 2022-02-05 MED ORDER — PIPERACILLIN-TAZOBACTAM 3.375 G IVPB
3.3750 g | Freq: Three times a day (TID) | INTRAVENOUS | Status: DC
  Administered 2022-02-05 – 2022-02-10 (×17): 3.375 g via INTRAVENOUS
  Filled 2022-02-05 (×16): qty 50

## 2022-02-05 MED ORDER — VANCOMYCIN HCL 1250 MG/250ML IV SOLN
1250.0000 mg | Freq: Two times a day (BID) | INTRAVENOUS | Status: DC
  Administered 2022-02-05 – 2022-02-08 (×6): 1250 mg via INTRAVENOUS
  Filled 2022-02-05 (×7): qty 250

## 2022-02-05 NOTE — Progress Notes (Signed)
Orthopedic Tech Progress Note Patient Details:  [name] [birth year] [id number]  Ortho Devices Type of Ortho Device: Darco shoe Ortho Device/Splint Location: LLE Ortho Device/Splint Interventions: Ordered   Post Interventions Patient Tolerated: Well Instructions Provided: Care of device  Janit Pagan 02/05/2022, 8:40 AM

## 2022-02-05 NOTE — Progress Notes (Signed)
Physical Therapy Treatment Patient Details Name: [name] MRN: [id number] DOB: [birth year] Today's Date: 02/05/2022   History of Present Illness Pt is a 71 y/o male admitted 01/29/22 with L second toe ulcer. S/p L common femoral artery to anterior tibial artery bypass with ipsilateral nonreversed great saphenous vein 11/29. S/p L 2nd toe amputation 12/5. PMH includes HTN, CAD, DM, prostate cancer, MI, cataracts, OSA   PT Comments    Pt progressing with mobility, though with increased difficulty ambulating s/p L 2nd toe amputaton yesterday. Today's session focused on transfer and gait training with RW, pt able to walk 16' requiring frequent min-modA while maintaining L foot heel WB in darco shoe. Pt remains limited by generalized weakness, decreased activity tolerance, poor balance strategies/postural reactions and impaired cognition. Based on indep PLOF and motivation to participate, recommend intensive AIR-level therapies to maximize functional mobility and independence prior to return home.    Recommendations for follow up therapy are one component of a multi-disciplinary discharge planning process, led by the attending physician.  Recommendations may be updated based on patient status, additional functional criteria and insurance authorization.  Follow Up Recommendations  Acute inpatient rehab (3hours/day) Can patient physically be transported by private vehicle: Yes   Assistance Recommended at Discharge Intermittent Supervision/Assistance  Patient can return home with the following A lot of help with walking and/or transfers;A lot of help with bathing/dressing/bathroom;Assistance with cooking/housework;Direct supervision/assist for medications management;Direct supervision/assist for financial management;Assist for transportation;Help with stairs or ramp for entrance   Equipment Recommendations  Rolling walker (2 wheels);BSC/3in1    Recommendations for Other Services        Precautions / Restrictions Precautions Precautions: Fall Restrictions Weight Bearing Restrictions: Yes LLE Weight Bearing: Partial weight bearing LLE Partial Weight Bearing Percentage or Pounds: thru heel with darco shoe     Mobility  Bed Mobility Overal bed mobility: Needs Assistance Bed Mobility: Supine to Sit     Supine to sit: Mod assist     General bed mobility comments: modA for HHA to elevate trunk and scoot hips to EOB, significant increased time and effort with c/o LLE pain, pt falling back 2x requiring assist to correct; c/o nausea sitting EOB requiring seated rest break    Transfers Overall transfer level: Needs assistance Equipment used: Rolling walker (2 wheels) Transfers: Sit to/from Stand Sit to Stand: Min assist, From elevated surface           General transfer comment: poor safety awareness of with RW tipping over as pt pulling on it to stand; cues for sequencing and hand placement, minA for trunk elevation and stability    Ambulation/Gait Ambulation/Gait assistance: Min assist, Mod assist Gait Distance (Feet): 14 Feet Assistive device: Rolling walker (2 wheels) Gait Pattern/deviations: Step-to pattern, Step-through pattern, Wide base of support, Trunk flexed, Antalgic Gait velocity: Decreased     General Gait Details: slow, antalgic, unsteady gait with RW and consistent min-modA for RW management and stability; repeated cues for sequencing "walker, step, step" as pt very concerned about which foot to step with first, but then forgets sequencing; significant increased time and effort to complete short ambulation distance; darco shoe donned for L foot heel WB. distance limited by c/o pain and fatigue   Stairs             Wheelchair Mobility    Modified Rankin (Stroke Patients Only)       Balance Overall balance assessment: Needs assistance Sitting-balance support: Feet supported, No upper extremity supported Sitting balance-Leahy  Scale:  Fair Sitting balance - Comments: increased time to gain balance sitting EOB, progressing to supervision; required assist to don darco shoe   Standing balance support: During functional activity, Bilateral upper extremity supported Standing balance-Leahy Scale: Poor Standing balance comment: heavy reliance on BUE support for static and dynamic standing balance, as well as intermittent external support                            Cognition Arousal/Alertness: Awake/alert Behavior During Therapy: Flat affect Overall Cognitive Status: Impaired/Different from baseline Area of Impairment: Attention, Memory, Following commands, Safety/judgement, Awareness, Problem solving                   Current Attention Level: Selective Memory: Decreased recall of precautions Following Commands: Follows one step commands with increased time Safety/Judgement: Decreased awareness of safety, Decreased awareness of deficits Awareness: Emergent Problem Solving: Difficulty sequencing, Slow processing, Requires verbal cues General Comments: significant difficulty sequencing steps with darco shoe and RW today. apparent cognitive deficits likely exacerbated by [facility]        Exercises      General Comments General comments (skin integrity, edema, etc.): wife present and supportive      Pertinent Vitals/Pain Pain Assessment Pain Assessment: Faces Faces Pain Scale: Hurts even more Pain Location: L LE (especially groin incision) Pain Descriptors / Indicators: Discomfort, Grimacing, Guarding Pain Intervention(s): Monitored during session, Limited activity within patient's tolerance, Repositioned    Home Living                          Prior Function            PT Goals (current goals can now be found in the care plan section) Progress towards PT goals: Progressing toward goals    Frequency    Min 3X/week      PT Plan Current plan remains appropriate    Co-evaluation               AM-PAC PT "6 Clicks" Mobility   Outcome Measure  Help needed turning from your back to your side while in a flat bed without using bedrails?: A Lot Help needed moving from lying on your back to sitting on the side of a flat bed without using bedrails?: A Lot Help needed moving to and from a bed to a chair (including a wheelchair)?: A Lot Help needed standing up from a chair using your arms (e.g., wheelchair or bedside chair)?: A Little Help needed to walk in hospital room?: Total Help needed climbing 3-5 steps with a railing? : Total 6 Click Score: 11    End of Session Equipment Utilized During Treatment: Gait belt Activity Tolerance: Patient tolerated treatment well;Patient limited by pain;Patient limited by fatigue Patient left: in chair;with call bell/phone within reach Nurse Communication: Mobility status PT Visit Diagnosis: Difficulty in walking, not elsewhere classified (R26.2);Pain     Time: 3235-5732 PT Time Calculation (min) (ACUTE ONLY): 34 min  Charges:  $Gait Training: 8-22 mins $Therapeutic Activity: 8-22 mins                     Mabeline Caras, PT, DPT Acute Rehabilitation Services  Personal: [place] [facility] Office: [place] 02/05/2022, 11:18 AM

## 2022-02-05 NOTE — Progress Notes (Signed)
Mobility Specialist Progress Note:   02/05/22 1249  Mobility  Activity Ambulated with assistance in room;Ambulated with assistance to bathroom  Level of Assistance Minimal assist, patient does 75% or more  Assistive Device Front wheel walker  Distance Ambulated (ft) 40 ft  Activity Response Tolerated well  $Mobility charge 1 Mobility   Pt in chair asking to go to bathroom. Complaints of 8/10 in L leg. MinA to stand and cues to WB through L heel. Left in chair with call bell in reach and all needs met.  Gareth Eagle Makayleigh Poliquin Mobility Specialist Please contact via [employer] or  Rehab Office at [phone]

## 2022-02-05 NOTE — Progress Notes (Addendum)
[facility] [place]  Subjective  - Increased pain in the left groin and left second toe amp site.   Objective (!) 182/142 78 (!) 100.7 F (38.2 C) (Oral) 20 99%  Intake/Output Summary (Last 24 hours) at 02/05/2022 0743 Last data filed at 02/05/2022 0700 Gross per 24 hour  Intake 645.48 ml  Output 2100 ml  Net -1454.52 ml    Left LE dressing in place  Left groin firm with erythema, without frank hematoma, no drainage Palpable bypass pulse, motor minimal intact left residual toes. Lungs non labored breathing   Assessment/Planning: POD # 7 71 y.o. male is s/p:  1.  Harvest of left leg great saphenous vein 2.  Left common femoral artery to anterior tibial artery bypass with ipsilateral nonreversed great saphenous vein POD # 1 Left second toe amputation Pending pain control, and ambulation.   Continue Ancef IV for left groin erythema Will ordered cbc    Roxy Horseman 02/05/2022 7:43 AM --  Laboratory Lab Results: No results for input(s): "WBC", "HGB", "HCT", "PLT" in the last 72 hours. BMET No results for input(s): "NA", "K", "CL", "CO2", "GLUCOSE", "BUN", "CREATININE", "CALCIUM" in the last 72 hours.  COAG Lab Results  Component Value Date   INR 1.0 01/29/2022   INR 1.0 08/08/2017   No results found for: "PTT"  I have seen and evaluated the patient. I agree with the PA note as documented above.  Postop day 7 status post left common femoral to anterior tibial bypass with ipsilateral nonreversed great saphenous vein.  Yesterday underwent left second toe amputation with podiatry.  Left leg bypass still has a palpable pulse in the vein graft.  Has some cellulitis in the left groin that we noted yesterday.  Was started on Ancef and Flagyl.  This morning is febrile to 101.9.  Will get blood cultures and start Vanc Zosyn.  Actually his groin looks a little bit better.  Not having as much pain in his groin as he did yesterday.  Foot is wrapped  after toe amp with podiatry.  Has a Darco shoe.  Marty Heck, MD [facility] [place] Office: [phone]

## 2022-02-05 NOTE — Progress Notes (Signed)
Inpatient Rehab Admissions Coordinator:   Per therapy recommendations, patient was screened for CIR candidacy by Clemens Catholic, MS, CCC-SLP . At this time, Pt. does not appear to demonstrate medical necessity to justify in hospital rehabilitation/CIR. will not pursue a rehab consult for this Pt.   I also do not think that payor would approve AIR level of care for pt.'s diagnosis. Recommend other rehab venues to be pursued.  Please contact me with any questions.  Clemens Catholic, [place], [place] Admissions Coordinator  [phone] ([place]) [phone] (office)

## 2022-02-05 NOTE — Progress Notes (Signed)
   PODIATRY PROGRESS NOTE Patient Name: [name]  DOB 1950/05/11 DOA 01/29/2022  Hospital Day: 8  Assessment:  71 y.o. male with 2nd toe gangrene 2/2 PAD LLE s/p LLE bypass and now POD 1 s/p L 2nd toe amputation for gangrene. Pt progressing well post op  AF, VSS  WBC: 6.4   Wound/Bone Cultures: pending, NGTD  Imaging: S/p L 2nd toe amputation  Plan:  - Pt POD 1 s/p L 2nd toe amputation healing well no evidence of necrosis or infection at amputation site.  -Appreciate vascular assistance. S/p LLE bypass.  - Recommend course of PO abx post discharge, possibly Augmentin or doxycycline for 7-10 days post discharge.  - Dressing to remain clean dry intact until follow up next week thurs/fri in office with Dr. Jacqualyn Posey.  - Heel WB to L foot in forefoot offloading shoe ok  Will continue to follow        Everitt Amber, DPM [facility]    Subjective:  Pt seen bedside, says he is dealing with pain issues, does not report having taken oxycodone recently. Has been walking with forefoot offloading shoe with weight to heel only. Denies any acute concerns.   Objective:   Vitals:   02/05/22 1000 02/05/22 1115  BP: 123/74 135/68  Pulse:  77  Resp:  20  Temp:  98.8 F (37.1 C)  SpO2:  98%       Latest Ref Rng & Units 02/05/2022    8:01 AM 01/30/2022    4:50 AM 01/29/2022    8:30 AM  CBC  WBC 4.0 - 10.5 K/uL 6.4  8.7  11.4   Hemoglobin 13.0 - 17.0 g/dL 11.0  11.7  14.1   Hematocrit 39.0 - 52.0 % 31.3  33.4  40.8   Platelets 150 - 400 K/uL 235  175  216        Latest Ref Rng & Units 01/30/2022    4:50 AM 01/29/2022    8:30 AM 01/17/2022    7:11 AM  BMP  Glucose 70 - 99 mg/dL 109  118  136   BUN 8 - 23 mg/dL '8  13  14   '$ Creatinine 0.61 - 1.24 mg/dL 0.83  0.95  0.90   Sodium 135 - 145 mmol/L 137  134  141   Potassium 3.5 - 5.1 mmol/L 3.6  3.7  4.5   Chloride 98 - 111 mmol/L 102  100  102   CO2 22 - 32 mmol/L 24  24    Calcium 8.9 - 10.3 mg/dL  8.0  9.0      General: AAOx3, NAD  Lower Extremity Exam Vasc:  L - PT 1/4 palpable, DP 1/4 palpable. Cap refill <3 sec to digits  Derm: R - Normal temp/texture/turgor with no open lesion or clinical signs of infection  L - Left 2nd toe amputation site healing well no residual erythema, no drainage dehisence or other signs of infection    MSK:  R -  No gross deformities. Compartments soft, non-tender, compressible  L - s/p L 2nd toe amputation at MPJ level  Neuro: R - Gross sensation diminished. Gross motor function intact   L - Gross sensation diminshed. Gross motor function intact   Radiology:  Results reviewed. See assessment for pertinent imaging results

## 2022-02-05 NOTE — Op Note (Signed)
??/  2023   7:15 PM   PATIENT:  [name]  71 y.o. male   PRE-OPERATIVE DIAGNOSIS:  gangrene second toe   POST-OPERATIVE DIAGNOSIS:  gangrene second toe   PROCEDURE:  Procedure(s): AMPUTATION  SECOND TOE (Left)   SURGEON:  Surgeon(s) and Role:    * Trula Slade, DPM - Primary   PHYSICIAN ASSISTANT:    ASSISTANTS: none    ANESTHESIA:   MAC   EBL:  minimal    BLOOD ADMINISTERED:none   DRAINS: none    LOCAL MEDICATIONS USED:  MARCAINE   , BUPIVICAINE , and Amount: 20 ml   SPECIMEN:  Source of Specimen:  toe for pathology, wound culture   DISPOSITION OF SPECIMEN:  PATHOLOGY   COUNTS:  YES   TOURNIQUET:  * No tourniquets in log *   DICTATION: .Dragon Dictation   PLAN OF CARE: Admit to inpatient    PATIENT DISPOSITION:  PACU - hemodynamically stable.   Delay start of Pharmacological VTE agent (>24hrs) due to surgical blood loss or risk of bleeding: no   Intraoperative findings: S/p 2nd toe amputation. Metatarsal appeared viable, no proximal signs of infection at this time. Minimal bleeding noted. He has a pre-ulcerative area on the medial 3rd toe where it was rubbing that needs to be watched.    Indications for surgery: 71 year old male admitted to the hospital with worsening wound of his left second toe.  Previous underwent bypass.  Given the necrosis, infection the toes tend to be amputated.  We discussed the surgery as well as postoperative course.  Alternatives, risks, complications were discussed.  No promises or guarantees were given second the procedure and all questions were answered the best my ability.  Procedure detail: Patient was both verbally and visually identified by myself, nursing staff, the anesthesia staff preoperatively.  He was then transferred to the operating room via stretcher and placed on the operative table in supine position.  Left lower extremities and scrubbed, prepped, draped in normal sterile fashion.  Timeout was  performed.  Initially 10 mL of lidocaine, Marcaine plain was infiltrated in a digital block fashion.  Once anesthetized a racquet shaped incision was made on the second MPJ.  Incision was made with a 10 blade scalpel from skin to bone and the toe was disarticulated at the level of the MPJ.  Toe was sent to pathology.  I did take a wound culture at this time.  The metatarsal appeared to be viable.  The bone was hard in nature, white in color.  No proximal tracking of infection or purulence.  Copiously irrigated with saline and hemostasis achieved.  Incision was closed with 3-0 nylon.  Xeroform followed by dry sterile dressing was applied.  He was woken from anesthesia and found tolerated the procedure well and complications.  Transferred PACU vital signs stable and vascular status intact.  Postop course: Weightbearing as tolerated in Darco shoe, although limited weightbearing to help facilitate healing.  Continue Ancef for now.  Will likely change the dressing tomorrow.  Need to monitor closely postoperatively to monitor healing.

## 2022-02-05 NOTE — Progress Notes (Signed)
Pharmacy Antibiotic Note  Clayton Harn. is a 71 y.o. male  with groin cellulitis.  Previously on Cefazolin Pharmacy has been consulted for Vancomycin / Zosyn dosing.  Plan: Vancomycin 2 grams iv 1 dose now then 1250 mg iv Q 12 hours Zosyn 3.375 grams iv Q 8 hours - 4 hr infusion Follow for improvement.  Height: 5' 10.5" (179.1 cm) Weight: 100.6 kg (221 lb 12.5 oz) IBW/kg (Calculated) : 74.15  Temp (24hrs), Avg:99.9 F (37.7 C), Min:98.5 F (36.9 C), Max:101.9 F (38.8 C)  Recent Labs  Lab 01/30/22 0450 02/05/22 0801  WBC 8.7 6.4  CREATININE 0.83  --     Estimated Creatinine Clearance: 100.7 mL/min (by C-G formula based on SCr of 0.83 mg/dL).    Allergies  Allergen Reactions   Cymbalta [Duloxetine Hcl] Nausea And Vomiting   Darvocet [Propoxyphene N-Acetaminophen] Hives   Thank you for allowing pharmacy to be a part of this patient's care.  Tad Moore 02/05/2022 8:56 AM

## 2022-02-06 ENCOUNTER — Telehealth: Payer: Self-pay | Admitting: *Deleted

## 2022-02-06 ENCOUNTER — Telehealth: Payer: Self-pay

## 2022-02-06 ENCOUNTER — Other Ambulatory Visit: Payer: Self-pay

## 2022-02-06 LAB — GLUCOSE, CAPILLARY
Glucose-Capillary: 104 mg/dL — ABNORMAL HIGH (ref 70–99)
Glucose-Capillary: 149 mg/dL — ABNORMAL HIGH (ref 70–99)
Glucose-Capillary: 144 mg/dL — ABNORMAL HIGH (ref 70–99)
Glucose-Capillary: 128 mg/dL — ABNORMAL HIGH (ref 70–99)

## 2022-02-06 NOTE — Telephone Encounter (Addendum)
Patient's grand daughter is wanting to speak with physician, has some questions, patient is still in hospital , seems as though he is doing worse. One day the patient had to use the bathroom and it took almost 2 hours before they got him up.What  can they do moving forward? How long should it be before he is up and walking? Please call to clarify.

## 2022-02-06 NOTE — Progress Notes (Signed)
Mobility Specialist Progress Note:   02/06/22 1438  Mobility  Activity Transferred from chair to bed  Level of Assistance Moderate assist, patient does 50-74%  Assistive Device Front wheel walker  Distance Ambulated (ft) 2 ft  Activity Response Tolerated well  $Mobility charge 1 Mobility   Pt received in chair asking to get back to bed. Complaints of L foot soreness. ModA to stand and max cues to step to bed. Left in bed with call bell in reach and all needs met.   Gareth Eagle Naythen Heikkila Mobility Specialist Please contact via [employer] or  Rehab Office at [phone]

## 2022-02-06 NOTE — Progress Notes (Signed)
Patients wife concerned patient has not been ambulating since surgery yesterday. Notified wife patient has been seen by mobility specialist as well as physical therapy for sessions yesterday. Wife stated he only walked to the bathroom. This RN asked mobility to come work with patient this morning. Patient ambulated with assistance in the hallway. Wife present, taking pictures of patient in hallway ambulating. Notified PA of wifes concerns. Patients wife and two other family members present.  Daymon Larsen, RN

## 2022-02-06 NOTE — Telephone Encounter (Signed)
Pt's granddaughter called with c/o of pt care issues and concerns wanting to speak to MD. MD made aware. I have spoken to 4E charge nurse, who stated pt has been up walking several times today and that she will follow up with any concerns with family.

## 2022-02-06 NOTE — Progress Notes (Addendum)
[facility] [place]  Subjective  - The left groin seems to feel better.   Objective (!) 140/70 73 (!) 96.5 F (35.8 C) (Axillary) 18 99%  Intake/Output Summary (Last 24 hours) at 02/06/2022 0740 Last data filed at 02/06/2022 0700 Gross per 24 hour  Intake 2543.11 ml  Output 2100 ml  Net 443.11 ml   Left LE dressing in place  Left groin firmness without expansion, less tender to palpation, appears to be decreased erythema. Left toe motor intact with intact sensation Lungs non labored breathing    Assessment/Planning: POD # 8  71 y.o. male is s/p:  1.  Harvest of left leg great saphenous vein 2.  Left common femoral artery to anterior tibial artery bypass with ipsilateral nonreversed great saphenous vein  Left second toe amputation site appears viable per DPM note and dressing was changed yesterday.  Left groin appears improved and no frank hematoma.    Tm 101.9 02/04/22, no leukocytosis WBC 6.4. Continue IV antibiotics. Patient has no assistance at home and needs help with SNF verses CIR.   Will consult TOC    Roxy Horseman 02/06/2022 7:40 AM --  Laboratory Lab Results: Recent Labs    02/05/22 0801  WBC 6.4  HGB 11.0*  HCT 31.3*  PLT 235   BMET No results for input(s): "NA", "K", "CL", "CO2", "GLUCOSE", "BUN", "CREATININE", "CALCIUM" in the last 72 hours.  COAG Lab Results  Component Value Date   INR 1.0 01/29/2022   INR 1.0 08/08/2017   No results found for: "PTT"  I have seen and evaluated the patient. I agree with the PA note as documented above.  Postop day 8 status post left common femoral to anterior tibial bypass for critical ischemia with tissue loss.  Has subsequently gotten a left second toe amputation with podiatry.  Still has an excellent graft pulse.  Febrile yesterday to 101.9.  We did send blood cultures and started Vanc Zosyn.  The left groin is looking better as he had some cellulitis here with some pain that  is improving.  PT recommending CIR but apparently rehab does not feel he is a candidate.  Patient needs placement so we will consult care management for SNF.  Marty Heck, MD [facility] [place] Office: [phone]

## 2022-02-06 NOTE — Progress Notes (Signed)
Mobility Specialist Progress Note:   02/06/22 1132  Mobility  Activity Ambulated with assistance in hallway  Level of Assistance Moderate assist, patient does 50-74%  Assistive Device  (EVA walker)  Distance Ambulated (ft) 60 ft  Activity Response Tolerated well  $Mobility charge 1 Mobility   Pt received in bed willing to participate in mobility. Complaints of LLE pain. ModA to stand. Upon initial stand pt shaky and unable to remain standing. Pt then minA to stand with EVA walker and contact guard throughout ambulation. Require cues to WB through heel and to take wider steps. Pt required a seated break /t fatigue and needed to be wheeled back to bed. Left in chair with call bell in reach and all needs met.   Gareth Eagle Oliva Montecalvo Mobility Specialist Please contact via [employer] or  Rehab Office at [phone]

## 2022-02-06 NOTE — Progress Notes (Addendum)
Occupational Therapy Treatment Patient Details Name: [name] MRN: [id number] DOB: Oct 31, 1950 Today's Date: 02/06/2022   History of present illness Pt is a 71 y/o male admitted 01/29/22 with L second toe ulcer. S/p L common femoral artery to anterior tibial artery bypass with ipsilateral nonreversed great saphenous vein 11/29. S/p L 2nd toe amputation 12/5. PMH includes HTN, CAD, DM, prostate cancer, MI, cataracts, OSA   OT comments  Pt seated in recliner and agreeable to OT session, family at side and supportive.  Patient reports fatigue but overall feeling well today.  Attempts to stand x 3 from recliner with max assist and unable, able to stand with +2 mod assist from recliner- requires constant cueing for hand placement, forward lean and safety.  He attempts to pull up on the walker throughout session and redirected, with poor carryover of techniques. Pt with decreased awareness of deficits and safety, during mobility picking 1 hand off RW and advance without BUE support. Requires total assist for LB dressing and min assist for grooming.  VSS during session. RN notified of cognitive changes noted this session. Will follow acutely, SNF remains appropriate.    Recommendations for follow up therapy are one component of a multi-disciplinary discharge planning process, led by the attending physician.  Recommendations may be updated based on patient status, additional functional criteria and insurance authorization.    Follow Up Recommendations  Skilled nursing-short term rehab (<3 hours/day)     Assistance Recommended at Discharge Frequent or constant Supervision/Assistance  Patient can return home with the following  Help with stairs or ramp for entrance;Assist for transportation;Assistance with cooking/housework;Two people to help with walking and/or transfers;A lot of help with bathing/dressing/bathroom;Direct supervision/assist for medications management;Direct supervision/assist for  financial management   Equipment Recommendations  Other (comment) (defer)    Recommendations for Other Services      Precautions / Restrictions Precautions Precautions: Fall Restrictions Weight Bearing Restrictions: Yes LLE Weight Bearing: Non weight bearing LLE Partial Weight Bearing Percentage or Pounds: thru heel with darco shoe       Mobility Bed Mobility               General bed mobility comments: OOB upon entry in recliner    Transfers Overall transfer level: Needs assistance Equipment used: Rolling walker (2 wheels) Transfers: Sit to/from Stand Sit to Stand: Mod assist, +2 physical assistance, +2 safety/equipment           General transfer comment: mulitple stand attempts with +1 given max assist and unable to fully stand, +2 mod assist to power up from recliner with max cueing for hand placement, sequencing and forward lean     Balance Overall balance assessment: Needs assistance Sitting-balance support: No upper extremity supported, Feet supported Sitting balance-Leahy Scale: Fair Sitting balance - Comments: posterior lean, difficulty staying forward without min assist   Standing balance support: Bilateral upper extremity supported, During functional activity Standing balance-Leahy Scale: Poor Standing balance comment: heavy reliance on BUE support, standing at sink leaning on forearms                           ADL either performed or assessed with clinical judgement   ADL Overall ADL's : Needs assistance/impaired     Grooming: Minimal assistance;Standing Grooming Details (indicate cue type and reason): min assist to maintain standing balance, cueing for upright posture.  requires cueing to sequence steps, specifically drying hands after washing  Upper Body Dressing : Sitting;Minimal assistance   Lower Body Dressing: Sit to/from stand;Total assistance;+2 for physical assistance;+2 for safety/equipment   Toilet Transfer:  Moderate assistance;+2 for physical assistance;+2 for safety/equipment;Ambulation;Rolling walker (2 wheels)           Functional mobility during ADLs: Moderate assistance;+2 for physical assistance;+2 for safety/equipment;Rolling walker (2 wheels)      Extremity/Trunk Assessment              Vision       Perception     Praxis      Cognition Arousal/Alertness: Awake/alert Behavior During Therapy: Flat affect Overall Cognitive Status: Impaired/Different from baseline Area of Impairment: Attention, Memory, Following commands, Safety/judgement, Awareness, Problem solving, Orientation                 Orientation Level: Disoriented to, Time Current Attention Level: Sustained Memory: Decreased recall of precautions, Decreased short-term memory Following Commands: Follows one step commands inconsistently, Follows one step commands with increased time Safety/Judgement: Decreased awareness of deficits, Decreased awareness of safety Awareness: Emergent Problem Solving: Slow processing, Decreased initiation, Difficulty sequencing, Requires verbal cues, Requires tactile cues General Comments: patient inconsistent with voicing month today (between nov/dec) appearing mildly confused.  He is inconsistently following simple commands, poor attention, problem solving, and sequencing with hand placement for transfers, needing to dry hands off before walking.        Exercises      Shoulder Instructions       General Comments family present, supportive.  Family voices frustration with pts decline in status.  RN notified of cognitive changes this therapist noted.    Pertinent Vitals/ Pain       Pain Assessment Pain Assessment: Faces Faces Pain Scale: Hurts little more Pain Location: L LE Pain Descriptors / Indicators: Discomfort, Grimacing, Guarding Pain Intervention(s): Limited activity within patient's tolerance, Monitored during session, Repositioned  Home Living                                           Prior Functioning/Environment              Frequency  Min 2X/week        Progress Toward Goals  OT Goals(current goals can now be found in the care plan section)  Progress towards OT goals: Not progressing toward goals - comment;OT to reassess next treatment  Acute Rehab OT Goals Patient Stated Goal: none stated Time For Goal Achievement: 02/13/22 Potential to Achieve Goals: Good  Plan Discharge plan remains appropriate;Frequency remains appropriate    Co-evaluation                 AM-PAC OT "6 Clicks" Daily Activity     Outcome Measure   Help from another person eating meals?: None Help from another person taking care of personal grooming?: A Little Help from another person toileting, which includes using toliet, bedpan, or urinal?: A Lot Help from another person bathing (including washing, rinsing, drying)?: A Lot Help from another person to put on and taking off regular upper body clothing?: A Little Help from another person to put on and taking off regular lower body clothing?: Total 6 Click Score: 15    End of Session Equipment Utilized During Treatment: Gait belt;Rolling walker (2 wheels)  OT Visit Diagnosis: Other abnormalities of gait and mobility (R26.89);Pain   Activity Tolerance Patient tolerated treatment well  Patient Left in chair;with call bell/phone within reach;with family/visitor present   Nurse Communication Mobility status        Time: 3142-7670 OT Time Calculation (min): 45 min  Charges: OT General Charges $OT Visit: 1 Visit OT Treatments $Self Care/Home Management : 23-37 mins $Therapeutic Activity: 8-22 mins  Jolaine Artist, OT Acute Rehabilitation Services Office [phone]   Delight Stare 02/06/2022, 1:13 PM

## 2022-02-07 ENCOUNTER — Telehealth: Payer: Self-pay

## 2022-02-07 LAB — BASIC METABOLIC PANEL
Anion gap: 10 (ref 5–15)
BUN: 12 mg/dL (ref 8–23)
CO2: 22 mmol/L (ref 22–32)
Calcium: 8.1 mg/dL — ABNORMAL LOW (ref 8.9–10.3)
Chloride: 103 mmol/L (ref 98–111)
Creatinine, Ser: 0.85 mg/dL (ref 0.61–1.24)
GFR, Estimated: >60 mL/min (ref >60)
Glucose, Bld: 110 mg/dL — ABNORMAL HIGH (ref 70–99)
Potassium: 4.2 mmol/L (ref 3.5–5.1)
Sodium: 135 mmol/L (ref 135–145)

## 2022-02-07 LAB — GLUCOSE, CAPILLARY
Glucose-Capillary: 114 mg/dL — ABNORMAL HIGH (ref 70–99)
Glucose-Capillary: 164 mg/dL — ABNORMAL HIGH (ref 70–99)
Glucose-Capillary: 127 mg/dL — ABNORMAL HIGH (ref 70–99)
Glucose-Capillary: 141 mg/dL — ABNORMAL HIGH (ref 70–99)

## 2022-02-07 NOTE — Telephone Encounter (Signed)
Spoke with the grand daughter [name] information that Dr Sherryle Lis will be there tomorrow ,verbalized understanding.

## 2022-02-07 NOTE — Progress Notes (Signed)
Mobility Specialist Progress Note   02/07/22 0915  Mobility  Activity Refused mobility   Patient received in supine asleep and hard to arouse. When awoken patient lethargic, keeping eyes closed and answering questions minimally. Will f/u as time permits.  [place] Raheel Kunkle, BS EXP Mobility Specialist Please contact via [employer] or Rehab office at [phone]

## 2022-02-07 NOTE — Progress Notes (Signed)
   02/07/22 1100  Clinical Encounter Type  Visited With Patient  Visit Type Initial;Other (Comment) (Advanced Directive)  Referral From Nurse  Consult/Referral To Chaplain   Chaplain responded to a spiritual consult for advanced directive education. The patient, Savyon, was open to a visit but presented as drowsy and unable to stay in conversation. I left the advanced directive packet with him and offered to return if he has questions. I also shared that he could complete it when he was ready whether it is here or at home.   Danice Goltz [facility]  [phone]

## 2022-02-07 NOTE — Progress Notes (Addendum)
[facility] [place]  Subjective  - Left groin may be a little better   Objective (!) 118/55 68 98.5 F (36.9 C) (Oral) 20 95%  Intake/Output Summary (Last 24 hours) at 02/07/2022 0729 Last data filed at 02/06/2022 2000 Gross per 24 hour  Intake 2256.32 ml  Output 1250 ml  Net 1006.32 ml    Palpable bypass pulse, motor intact of left foot residual toes Left groin with incisional firmness, decreased edema, decreased tenderness to palpation Compressive ace wrap intact Lungs non labored breathing  Assessment/Planning: POD # 39 70 y.o. male is s/p:  1.  Harvest of left leg great saphenous vein 2.  Left common femoral artery to anterior tibial artery bypass with ipsilateral nonreversed great saphenous vein   Left second toe amputation site appears viable per DPM note.   Left groin appears improved and no frank hematoma.   Continue IV antibiotics no leukocytosis 6.4, left groin improving, Tm 100.8 Blood cultures no growth to date Continue to mobilize  Patient has no assistance at home and needs help with SNF.  TOC consult placed.  Plan to cont.  IV antibiotics until Monday then discharge.  Roxy Horseman 02/07/2022 7:29 AM --  Laboratory Lab Results: Recent Labs    02/05/22 0801  WBC 6.4  HGB 11.0*  HCT 31.3*  PLT 235   BMET Recent Labs    02/07/22 0134  NA 135  K 4.2  CL 103  CO2 22  GLUCOSE 110*  BUN 12  CREATININE 0.85  CALCIUM 8.1*    COAG Lab Results  Component Value Date   INR 1.0 01/29/2022   INR 1.0 08/08/2017   No results found for: "PTT"  I have seen and evaluated the patient. I agree with the PA note as documented above.  Postop day 9 status post left common femoral to anterior tibial bypass with saphenous vein.  Subsequently required toe amputation with podiatry.  Has an excellent pulse in the bypass graft.  Had a fever earlier this week and sent blood cultures with no growth.  Some cellulitis in the left groin  that has been improving on broad-spectrum IV antibiotics.  I would like to keep him through the weekend to continue getting IV antibiotics and then hopefully to a SNF on Monday.  Groin is looking much better.  No drainage.  Cellulitis improving.  Marty Heck, MD [facility] [place] Office: [phone]

## 2022-02-07 NOTE — TOC Progression Note (Addendum)
Transition of Care (TOC) - Progression Note    Patient Details  Name: [name] MRN: [id number] Date of Birth: 14-Dec-1950  Transition of Care [facility]) CM/SW Contact  Joanne Chars, LCSW Phone Number: 02/07/2022, 8:18 AM  Clinical Narrative:   SNF auth approved in [place]: [id number], [id number], 3 days: 12/6-12/8.    Unable to message either MD or PA.  CSW LM at MD office requesting update on DC date.    Expected Discharge Plan: [place] Barriers to Discharge: Continued Medical Work up  Expected Discharge Plan and Services Expected Discharge Plan: [place] In-house Referral: Clinical Social Work     Living arrangements for the past 2 months: Single Family Home                 DME Arranged: Bedside commode, Walker rolling DME Agency: [employer] Date DME Agency Contacted: 01/31/22 Time DME Agency Contacted: 1500 Representative spoke with at DME Agency: Erasmo Downer             Social Determinants of Health ([place]) Interventions    Readmission Risk Interventions     No data to display

## 2022-02-07 NOTE — Telephone Encounter (Signed)
Greg @ [facility] called stating that the pt got the approval to go to SNF and wanted to know if the pt was ok to d/c.  Gwenette Greet, [place] text paged.

## 2022-02-07 NOTE — Progress Notes (Signed)
   PODIATRY PROGRESS NOTE  NAME [name] MRN [id number] DOB [birth year] DOA 01/29/2022   Reason for consult: No chief complaint on file.    History of present illness: 71 y.o. male POD # 3 status post left second toe amputation.  States he did get up out of bed this morning but he has not walked.  No significant pain in the foot.  No fever chills.  Vitals:   02/07/22 1615 02/07/22 1800  BP: 119/78   Pulse: 73   Resp: 20   Temp: 99.4 F (37.4 C) (!) 100.8 F (38.2 C)  SpO2: 98%        Latest Ref Rng & Units 02/05/2022    8:01 AM 01/30/2022    4:50 AM 01/29/2022    8:30 AM  CBC  WBC 4.0 - 10.5 K/uL 6.4  8.7  11.4   Hemoglobin 13.0 - 17.0 g/dL 11.0  11.7  14.1   Hematocrit 39.0 - 52.0 % 31.3  33.4  40.8   Platelets 150 - 400 K/uL 235  175  216        Latest Ref Rng & Units 02/07/2022    1:34 AM 01/30/2022    4:50 AM 01/29/2022    8:30 AM  BMP  Glucose 70 - 99 mg/dL 110  109  118   BUN 8 - 23 mg/dL '12  8  13   '$ Creatinine 0.61 - 1.24 mg/dL 0.85  0.83  0.95   Sodium 135 - 145 mmol/L 135  137  134   Potassium 3.5 - 5.1 mmol/L 4.2  3.6  3.7   Chloride 98 - 111 mmol/L 103  102  100   CO2 22 - 32 mmol/L '22  24  24   '$ Calcium 8.9 - 10.3 mg/dL 8.1  8.0  9.0       Physical Exam: General: AAOx3, NAD  Dermatology: Dressing clean, dry, intact  Vascular: Dressing intact  Neurological: Dressing intact  Musculoskeletal Exam: No significant pain    ASSESSMENT/PLAN OF CARE  POD # 3 s/p left second toe amputation  -Will plan for dressing change tomorrow -Currently on vancomycin, Zosyn -Weightbearing surgical shoe -Wound culture 12/5-no organisms-prelim -Pathology-pending    Please contact me directly with any questions or concerns.     Celesta Gentile, DPM [facility]  Dr. Bonna Gains. [address], [address] [place], [place] [address]                Office [phone]  Fax [phone]

## 2022-02-08 LAB — GLUCOSE, CAPILLARY
Glucose-Capillary: 91 mg/dL (ref 70–99)
Glucose-Capillary: 173 mg/dL — ABNORMAL HIGH (ref 70–99)
Glucose-Capillary: 95 mg/dL (ref 70–99)
Glucose-Capillary: 133 mg/dL — ABNORMAL HIGH (ref 70–99)

## 2022-02-08 MED ORDER — VANCOMYCIN HCL IN DEXTROSE 1-5 GM/200ML-% IV SOLN
1000.0000 mg | Freq: Two times a day (BID) | INTRAVENOUS | Status: DC
  Administered 2022-02-08 – 2022-02-10 (×5): 1000 mg via INTRAVENOUS
  Filled 2022-02-08 (×6): qty 200

## 2022-02-08 NOTE — Progress Notes (Signed)
Pharmacy Antibiotic Note  [name] is a 71 y.o. male  with groin cellulitis.  Previously on Cefazolin Pharmacy has been consulted for Vancomycin / Zosyn dosing. Scr 0.85 appears to be at baseline. WBC 6.4 wnl. Pt did have temp of 100.30F 12/8 at 1800 but now afebrile.   Plan: Adjust vancomycin to 1,000 mg IV q12h (eAUC 524.6, Scr 0.85, Vd 0.5)  Continue Zosyn 3.375 grams IV Q 8 hours EI F/u cultures, renal function, s/sx improvement, levels as indicated   Height: 5' 10.5" (179.1 cm) Weight: 100.6 kg (221 lb 12.5 oz) IBW/kg (Calculated) : 74.15  Temp (24hrs), Avg:99.2 F (37.3 C), Min:98 F (36.7 C), Max:100.8 F (38.2 C)  Recent Labs  Lab 02/05/22 0801 02/07/22 0134  WBC 6.4  --   CREATININE  --  0.85     Estimated Creatinine Clearance: 98.4 mL/min (by C-G formula based on SCr of 0.85 mg/dL).    Allergies  Allergen Reactions   Cymbalta [Duloxetine Hcl] Nausea And Vomiting   Darvocet [Propoxyphene N-Acetaminophen] Hives   Microbiology Results  12/6 BC x 2: NGTD 12/5 L Toe wounc Cx- rare normal skin flora    Thank you for allowing pharmacy to be a part of this patient's care.  Eliseo Gum, PharmD PGY1 Pharmacy Resident   02/08/2022  12:05 PM

## 2022-02-08 NOTE — Progress Notes (Signed)
   PODIATRY PROGRESS NOTE  NAME [name] MRN [id number] DOB 1950/05/21 DOA 01/29/2022   Reason for consult: No chief complaint on file.    History of present illness: 71 y.o. male POD # 4 status post left second toe amputation. States he walked last night and this morning. Currently he is sitting up in a chair.  No significant pain in the foot.  No fever chills.  His wife is at bedside. Vitals:   02/08/22 0813 02/08/22 1137  BP: 127/69 (!) 116/59  Pulse: 62 69  Resp: 20 17  Temp: 98.9 F (37.2 C) 98 F (36.7 C)  SpO2: 98% 97%   CBC    Component Value Date/Time   WBC 6.4 02/05/2022 0801   RBC 3.64 (L) 02/05/2022 0801   HGB 11.0 (L) 02/05/2022 0801   HGB 14.9 08/15/2020 1051   HCT 31.3 (L) 02/05/2022 0801   HCT 45.0 08/15/2020 1051   PLT 235 02/05/2022 0801   PLT 154 08/15/2020 1051   MCV 86.0 02/05/2022 0801   MCV 93 08/15/2020 1051   MCH 30.2 02/05/2022 0801   MCHC 35.1 02/05/2022 0801   RDW 11.6 02/05/2022 0801   RDW 12.5 08/15/2020 1051   LYMPHSABS 2.8 08/15/2020 1051   MONOABS 0.5 08/03/2018 0920   EOSABS 0.1 08/15/2020 1051   BASOSABS 0.0 08/15/2020 1051     Physical Exam: General: AAOx3, NAD  Dermatology: Incision well coapted sutures intact status post left second toe potation.  There is some edema and erythema present but there is no drainage or pus.  There is no fluctuance or crepitation.  There is no malodor.    Vascular: Foot is warm appears to be perfusing.  Neurological: Decree sensation  Musculoskeletal Exam: No significant pain    ASSESSMENT/PLAN OF CARE  POD # 4 s/p left second toe amputation  -Dressing changed today.  Betadine was applied followed by dressing.  Will plan to change the dressing prior to discharge. -Currently on vancomycin, Zosyn -Weightbearing darco wedge shoe which he has -Wound culture 12/5-no organisms-prelim -Pathology-pending -Will continue to follow.    Please contact me directly with any questions  or concerns.     Celesta Gentile, DPM [facility]  Dr. Bonna Gains. [address], [address] [place], [place] [address]                Office [phone]  Fax [phone]

## 2022-02-08 NOTE — Progress Notes (Signed)
Mobility Specialist Progress Note:   02/08/22 1242  Mobility  Activity Transferred from bed to chair  Level of Assistance Standby assist, set-up cues, supervision of patient - no hands on  Assistive Device Front wheel walker  Distance Ambulated (ft) 4 ft  Activity Response Tolerated well  $Mobility charge 1 Mobility   Pt received in bed willin to particiapte in mobility. No complaints of pain. Left in bed with call bell in reach and all needs met.   [name] [name] Mobility Specialist Please contact via [employer] or  Rehab Office at [phone]

## 2022-02-08 NOTE — Progress Notes (Signed)
Mobility Specialist Progress Note:   02/08/22 1024  Mobility  Activity Ambulated with assistance in hallway  Level of Assistance Minimal assist, patient does 75% or more  Assistive Device Front wheel walker  Distance Ambulated (ft) 500 ft  Activity Response Tolerated well  $Mobility charge 1 Mobility   Pt received in bed willing to participate in mobility. No complaints of pain. MinA to stand from EOB. Left in chair with call bell in reach and all needs met.   Gareth Eagle Alonna Bartling Mobility Specialist Please contact via [employer] or  Rehab Office at [phone]

## 2022-02-08 NOTE — Plan of Care (Signed)
  Problem: Activity: Goal: Ability to tolerate increased activity will improve Outcome: Progressing   Problem: Fluid Volume: Goal: Ability to maintain a balanced intake and output will improve Outcome: Progressing   Problem: Nutritional: Goal: Maintenance of adequate nutrition will improve Outcome: Progressing   Problem: Education: Goal: Knowledge of General Education information will improve Description: Including pain rating scale, medication(s)/side effects and non-pharmacologic comfort measures Outcome: Progressing   Problem: Pain Managment: Goal: General experience of comfort will improve Outcome: Progressing

## 2022-02-08 NOTE — Progress Notes (Addendum)
[facility] [place]  Subjective  - Moving more, still having pain more so in the groin on the left   Objective 127/69 62 98.9 F (37.2 C) (Oral) 20 98%  Intake/Output Summary (Last 24 hours) at 02/08/2022 0941 Last data filed at 02/08/2022 0606 Gross per 24 hour  Intake 3228.7 ml  Output 2250 ml  Net 978.7 ml    The groin is firm without evidence of expansion.  The erythema has slowly dissipated He has a palpable bypass pulse just below the knee and his amputation site seems to be healing per the DPM.  Lungs non labored breathing   Assessment/Planning: POD # 70 71 y.o. male is s/p:  1.  Harvest of left leg great saphenous vein 2.  Left common femoral artery to anterior tibial artery bypass with ipsilateral nonreversed great saphenous vein   Left second toe amputation site appears viable per DPM note.   Left groin appears improved and no frank hematoma.  I can feel a easily palpable pulse left groin.   He is moving more and walking more.   Plan is to continue IV antibiotics and possible discharge on Monday to SNF for continued rehab.    Roxy Horseman 02/08/2022 9:41 AM --  Laboratory Lab Results: No results for input(s): "WBC", "HGB", "HCT", "PLT" in the last 72 hours. BMET Recent Labs    02/07/22 0134  NA 135  K 4.2  CL 103  CO2 22  GLUCOSE 110*  BUN 12  CREATININE 0.85  CALCIUM 8.1*    COAG Lab Results  Component Value Date   INR 1.0 01/29/2022   INR 1.0 08/08/2017   No results found for: "PTT"   I have independently interviewed and examined patient and agree with PA assessment and plan above.  Left groin erythema improved from previous description on broad-spectrum antibiotics.  Patient has been walking in the hall this morning.  Pending SNF placement after this weekend.  Ashlinn Hemrick C. Donzetta Matters, MD [facility] Office: [phone] Pager: [phone]

## 2022-02-09 LAB — AEROBIC/ANAEROBIC CULTURE W GRAM STAIN (SURGICAL/DEEP WOUND): Culture: NORMAL

## 2022-02-09 LAB — BASIC METABOLIC PANEL
Anion gap: 11 (ref 5–15)
BUN: 9 mg/dL (ref 8–23)
CO2: 22 mmol/L (ref 22–32)
Calcium: 8.2 mg/dL — ABNORMAL LOW (ref 8.9–10.3)
Chloride: 105 mmol/L (ref 98–111)
Creatinine, Ser: 0.69 mg/dL (ref 0.61–1.24)
GFR, Estimated: >60 mL/min (ref >60)
Glucose, Bld: 175 mg/dL — ABNORMAL HIGH (ref 70–99)
Potassium: 3.6 mmol/L (ref 3.5–5.1)
Sodium: 138 mmol/L (ref 135–145)

## 2022-02-09 LAB — GLUCOSE, CAPILLARY
Glucose-Capillary: 105 mg/dL — ABNORMAL HIGH (ref 70–99)
Glucose-Capillary: 184 mg/dL — ABNORMAL HIGH (ref 70–99)
Glucose-Capillary: 87 mg/dL (ref 70–99)
Glucose-Capillary: 131 mg/dL — ABNORMAL HIGH (ref 70–99)

## 2022-02-09 MED ORDER — DM-GUAIFENESIN ER 30-600 MG PO TB12
1.0000 | ORAL_TABLET | Freq: Two times a day (BID) | ORAL | Status: DC
  Administered 2022-02-09 – 2022-02-11 (×5): 1 via ORAL
  Filled 2022-02-09 (×5): qty 1

## 2022-02-09 NOTE — Plan of Care (Signed)
  Problem: Activity: Goal: Ability to return to baseline activity level will improve Outcome: Progressing   Problem: Activity: Goal: Ability to tolerate increased activity will improve Outcome: Progressing   Problem: Fluid Volume: Goal: Ability to maintain a balanced intake and output will improve Outcome: Progressing   Problem: Nutritional: Goal: Maintenance of adequate nutrition will improve Outcome: Progressing

## 2022-02-09 NOTE — Progress Notes (Addendum)
[facility]  Subjective  - Moving better, groin better   Objective 134/65 66 98.2 F (36.8 C) (Oral) 15 96%  Intake/Output Summary (Last 24 hours) at 02/09/2022 0905 Last data filed at 02/09/2022 0741 Gross per 24 hour  Intake 3943.79 ml  Output 4450 ml  Net -506.21 ml    The groin is firm without evidence of expansion.  The erythema has slowly dissipated He has a palpable bypass pulse just below the knee and his amputation site seems to be healing per the DPM.  Lungs non labored breathing   Assessment/Planning: POD # 76  71 y.o. male is s/p:  1.  Harvest of left leg great saphenous vein 2.  Left common femoral artery to anterior tibial artery bypass with ipsilateral nonreversed great saphenous vein   Left second toe amputation site appears viable per DPM note.   Left groin appears improved and no frank hematoma.  I can feel a easily palpable pulse left groin.   He is moving more and walking more.   Plan is to continue IV antibiotics and possible discharge on Monday to SNF for continued rehab.     Roxy Horseman 02/09/2022 9:05 AM --  Laboratory Lab Results: No results for input(s): "WBC", "HGB", "HCT", "PLT" in the last 72 hours. BMET Recent Labs    02/07/22 0134  NA 135  K 4.2  CL 103  CO2 22  GLUCOSE 110*  BUN 12  CREATININE 0.85  CALCIUM 8.1*    COAG Lab Results  Component Value Date   INR 1.0 01/29/2022   INR 1.0 08/08/2017   No results found for: "PTT"  I have independently interviewed and examined patient and agree with PA assessment and plan above.   Oceanna Arruda C. Donzetta Matters, MD [facility] Office: [phone] Pager: [phone]

## 2022-02-09 NOTE — Progress Notes (Addendum)
Mobility Specialist Progress Note:   02/09/22 0945  Mobility  Activity Ambulated with assistance in hallway  Level of Assistance Standby assist, set-up cues, supervision of patient - no hands on  Assistive Device Front wheel walker  Distance Ambulated (ft) 450 ft  Activity Response Tolerated well  $Mobility charge 1 Mobility   Pt received in bed willing to participate in mobility. No complaints of pain. Max cues to WB through heel. Left in chair with call bell in reach and all needs met.   Gareth Eagle Sanyla Summey Mobility Specialist Please contact via [employer] or  Rehab Office at [phone]

## 2022-02-09 NOTE — Progress Notes (Signed)
PODIATRY PROGRESS NOTE  NAME [name] MRN [id number] DOB [birth year] DOA 01/29/2022   Reason for consult: No chief complaint on file.    History of present illness: 71 y.o. male POD # 5 status post left second toe amputation.  Doing well this morning without any fevers or chills.  Eager to get up and get walking.  Vitals:   02/09/22 0436 02/09/22 0741  BP: 134/65   Pulse:  66  Resp: 20 15  Temp: 98.2 F (36.8 C)   SpO2:  96%    CBC    Component Value Date/Time   WBC 6.4 02/05/2022 0801   RBC 3.64 (L) 02/05/2022 0801   HGB 11.0 (L) 02/05/2022 0801   HGB 14.9 08/15/2020 1051   HCT 31.3 (L) 02/05/2022 0801   HCT 45.0 08/15/2020 1051   PLT 235 02/05/2022 0801   PLT 154 08/15/2020 1051   MCV 86.0 02/05/2022 0801   MCV 93 08/15/2020 1051   MCH 30.2 02/05/2022 0801   MCHC 35.1 02/05/2022 0801   RDW 11.6 02/05/2022 0801   RDW 12.5 08/15/2020 1051   LYMPHSABS 2.8 08/15/2020 1051   MONOABS 0.5 08/03/2018 0920   EOSABS 0.1 08/15/2020 1051   BASOSABS 0.0 08/15/2020 1051       Latest Ref Rng & Units 02/07/2022    1:34 AM 01/30/2022    4:50 AM 01/29/2022    8:30 AM  BMP  Glucose 70 - 99 mg/dL 110  109  118   BUN 8 - 23 mg/dL '12  8  13   '$ Creatinine 0.61 - 1.24 mg/dL 0.85  0.83  0.95   Sodium 135 - 145 mmol/L 135  137  134   Potassium 3.5 - 5.1 mmol/L 4.2  3.6  3.7   Chloride 98 - 111 mmol/L 103  102  100   CO2 22 - 32 mmol/L '22  24  24   '$ Calcium 8.9 - 10.3 mg/dL 8.1  8.0  9.0     Physical Exam: General: AAOx3, NAD  Dermatology: Status post left second amputation with sutures intact which are well coapted.  There is no drainage or pus.  Minimal edema.  No erythema or warmth.  No drainage or pus or signs of infection noted otherwise.     Vascular: Foot is warm appears to be perfusing.  Neurological: Decree sensation  Musculoskeletal Exam: No significant pain    ASSESSMENT/PLAN OF CARE  POD # 4 s/p left second toe amputation  -Dressing changed  today.  Xeroform was applied followed by dressing.  Will plan to change the dressing prior to discharge.  Orders placed in AVS for when he goes to rehab. -Currently on vancomycin, Zosyn -Weightbearing darco wedge shoe which he has -Wound culture 12/5-no organisms-prelim -Pathology-pending -We will plan to see the patient on Tuesday if still admitted for dressing change otherwise patient is able to be discharged from podiatry standpoint.   Please contact me directly with any questions or concerns.     Celesta Gentile, DPM [facility]  Dr. Bonna Gains. [address], [address], [place] [address]                Office [phone]  Fax [phone])  [phone]   

## 2022-02-09 NOTE — TOC Progression Note (Signed)
Transition of Care (TOC) - Progression Note    Patient Details  Name: [name] MRN: [id number] Date of Birth: 1950-07-12  Transition of [facility]) CM/SW [place], LCSW Phone Number: 02/09/2022, 3:06 PM  Clinical Narrative:     Pt is now ambulating 450 ft and will need an updated PT note for authorization. CSW attempted to page PT however has not receive a response.   TOC team will continue to assist with discharge planning needs.   Expected Discharge Plan: [place] Barriers to Discharge: Continued Medical Work up  Expected Discharge Plan and Services Expected Discharge Plan: [place] In-house Referral: Clinical Social Work     Living arrangements for the past 2 months: Single Family Home                 DME Arranged: Bedside commode, Walker rolling DME Agency: [employer] Date DME Agency Contacted: 01/31/22 Time DME Agency Contacted: 1500 Representative spoke with at DME Agency: Erasmo Downer             Social Determinants of Health ([place]) Interventions    Readmission Risk Interventions   No data to display

## 2022-02-10 LAB — GLUCOSE, CAPILLARY
Glucose-Capillary: 105 mg/dL — ABNORMAL HIGH (ref 70–99)
Glucose-Capillary: 150 mg/dL — ABNORMAL HIGH (ref 70–99)
Glucose-Capillary: 117 mg/dL — ABNORMAL HIGH (ref 70–99)
Glucose-Capillary: 125 mg/dL — ABNORMAL HIGH (ref 70–99)

## 2022-02-10 LAB — CULTURE, BLOOD (ROUTINE X 2)
Culture: NO GROWTH
Culture: NO GROWTH
Special Requests: ADEQUATE
Special Requests: ADEQUATE

## 2022-02-10 LAB — SURGICAL PATHOLOGY

## 2022-02-10 MED ORDER — ROSUVASTATIN CALCIUM 20 MG PO TABS: 20.0000 mg | ORAL_TABLET | Freq: Every day | ORAL | 11 refills | Status: DC

## 2022-02-10 MED ORDER — OXYCODONE HCL 5 MG PO TABS: 5.0000 mg | ORAL_TABLET | ORAL | 0 refills | Status: DC | PRN

## 2022-02-10 NOTE — Progress Notes (Signed)
Mobility Specialist Progress Note:   02/10/22 1300  Mobility  Activity Ambulated with assistance in hallway  Level of Assistance Standby assist, set-up cues, supervision of patient - no hands on  Assistive Device Front wheel walker  Distance Ambulated (ft) 200 ft  Activity Response Tolerated well  $Mobility charge 1 Mobility   Pt received in bathroom willing to participate in mobility. Complaints of L foot pain. Left in bed with call bell in reach and all needs met.   Gareth Eagle Maurico Perrell Mobility Specialist Please contact via [employer] or  Rehab Office at [phone]

## 2022-02-10 NOTE — Progress Notes (Signed)
Physical Therapy Treatment Patient Details Name: [name] MRN: [id number] DOB: [birth year] Today's Date: 02/10/2022   History of Present Illness Pt is a 71 y/o male admitted 01/29/22 with L second toe ulcer. S/p L common femoral artery to anterior tibial artery bypass with ipsilateral nonreversed great saphenous vein 11/29. S/p L 2nd toe amputation 12/5. PMH includes HTN, CAD, DM, prostate cancer, MI, cataracts, OSA    PT Comments    Pt progressing steadily towards his physical therapy goals; exhibits improved activity tolerance and cognition. Pt requiring assist to don/doff his Darco shoe. Pt ambulating 75 ft with a walker, utilizing a step to pattern. Requires cueing for appropriate walker use and sequencing/technique in addition to weightbearing precautions. Pt continues to present as a high fall risk based on decreased gait speed and safety awareness. Pt also has decreased caregiver support. Recommend ST SNF to address deficits and maximize functional mobility.     Recommendations for follow up therapy are one component of a multi-disciplinary discharge planning process, led by the attending physician.  Recommendations may be updated based on patient status, additional functional criteria and insurance authorization.  Follow Up Recommendations  Skilled nursing-short term rehab (<3 hours/day) Can patient physically be transported by private vehicle: Yes   Assistance Recommended at Discharge Intermittent Supervision/Assistance  Patient can return home with the following A little help with walking and/or transfers;A little help with bathing/dressing/bathroom;Assistance with cooking/housework;Assist for transportation;Help with stairs or ramp for entrance   Equipment Recommendations  Rolling walker (2 wheels);BSC/3in1    Recommendations for Other Services       Precautions / Restrictions Precautions Precautions: Fall Restrictions Weight Bearing Restrictions: Yes LLE Weight  Bearing: Partial weight bearing LLE Partial Weight Bearing Percentage or Pounds: thru heel with darco shoe     Mobility  Bed Mobility Overal bed mobility: Modified Independent                  Transfers Overall transfer level: Needs assistance Equipment used: Rolling walker (2 wheels) Transfers: Sit to/from Stand Sit to Stand: Supervision                Ambulation/Gait Ambulation/Gait assistance: Supervision Gait Distance (Feet): 75 Feet Assistive device: Rolling walker (2 wheels) Gait Pattern/deviations: Wide base of support, Trunk flexed, Antalgic, Step-to pattern Gait velocity: decreased     General Gait Details: Verbal cues for sequencing/technique, walker use, weightbearing precautions. Moderate reliance through arms on walker   Stairs             Wheelchair Mobility    Modified Rankin (Stroke Patients Only)       Balance Overall balance assessment: Needs assistance Sitting-balance support: No upper extremity supported, Feet supported Sitting balance-Leahy Scale: Good     Standing balance support: Bilateral upper extremity supported, During functional activity Standing balance-Leahy Scale: Poor Standing balance comment: reliance on BUE support                            Cognition Arousal/Alertness: Awake/alert Behavior During Therapy: Flat affect Overall Cognitive Status: Impaired/Different from baseline Area of Impairment: Attention, Memory, Following commands, Safety/judgement, Awareness, Problem solving, Orientation                 Orientation Level: Disoriented to, Time Current Attention Level: Selective Memory: Decreased recall of precautions, Decreased short-term memory Following Commands: Follows one step commands consistently Safety/Judgement: Decreased awareness of deficits, Decreased awareness of safety Awareness: Emergent Problem Solving: Slow  processing, Decreased initiation, Difficulty sequencing,  Requires verbal cues, Requires tactile cues General Comments: Pt referring to calendar to orient to time; following 1 step commands consistently with fair carryover        Exercises      General Comments        Pertinent Vitals/Pain Pain Assessment Pain Assessment: Faces Faces Pain Scale: Hurts a little bit Pain Location: L LE Pain Descriptors / Indicators: Discomfort, Grimacing, Guarding Pain Intervention(s): Limited activity within patient's tolerance, Monitored during session    Home Living                          Prior Function            PT Goals (current goals can now be found in the care plan section) Acute Rehab PT Goals Patient Stated Goal: return home Potential to Achieve Goals: Good Progress towards PT goals: Progressing toward goals    Frequency    Min 2X/week      PT Plan Frequency needs to be updated;Discharge plan needs to be updated    Co-evaluation              AM-PAC PT "6 Clicks" Mobility   Outcome Measure  Help needed turning from your back to your side while in a flat bed without using bedrails?: None Help needed moving from lying on your back to sitting on the side of a flat bed without using bedrails?: None Help needed moving to and from a bed to a chair (including a wheelchair)?: A Little Help needed standing up from a chair using your arms (e.g., wheelchair or bedside chair)?: A Little Help needed to walk in hospital room?: A Little Help needed climbing 3-5 steps with a railing? : A Lot 6 Click Score: 19    End of Session Equipment Utilized During Treatment: Other (comment) (darco shoe) Activity Tolerance: Patient tolerated treatment well Patient left: in chair;with call bell/phone within reach Nurse Communication: Mobility status PT Visit Diagnosis: Difficulty in walking, not elsewhere classified (R26.2);Pain Pain - Right/Left: Left Pain - part of body: Leg     Time: 0937-1000 PT Time Calculation (min)  (ACUTE ONLY): 23 min  Charges:  $Gait Training: 8-22 mins $Therapeutic Activity: 8-22 mins                     Wyona Almas, PT, DPT Acute Rehabilitation Services Office [place] 02/10/2022, 11:01 AM

## 2022-02-10 NOTE — Discharge Summary (Signed)
Bypass Discharge Summary Patient ID: [name] [id number] 71 y.o. [birth year]  Admit date: 01/29/2022  Discharge date and time: 02/11/2022   Admitting Physician: Clayton Heck, MD   Discharge Physician: Clayton Heck, MD  Admission Diagnoses: Status post peripheral artery bypass [Z98.890] PAD (peripheral artery disease) [facility]) [I73.9]  Discharge Diagnoses: Status post peripheral artery bypass [Z98.890] PAD (peripheral artery disease) ([name]) [I73.9]  Admission Condition: poor  Discharged Condition: good  Indication for Admission: 71 year old male seen in the office with a diabetic ulcer of the left second toe.  He had depressed ABI of 0.6 with a toe pressure of 0.  He underwent angiogram with Dr. Scot Frost showing multi-level occlusive disease.  He is here today for left leg anterior tibial bypass after risks benefits discussed.  An assistant was needed for vein harvest and for the anastomosis.     Hospital Course: [name] was admitted on 01/29/22 and underwent left common femoral artery to anterior tibial artery bypass with ipsilateral nonreversed greater saphenous vein and harvest of left leg greater saphenous vein by Dr. Carlis [name] He tolerated the surgery well and was taken to the recovery room in stable condition   POD#1, Left leg remained well perfused with brisk doppler signals. Incisions all intact and well appearing. Encouraged mobilization. Pain control was needed and limited tolerance of a lot of mobilization. PT/ OT cleared patient initially. He remained otherwise hemodynamically stable.   The next several days consisted of continued pain control, tolerance of increased mobilization, and PO intake. POD#3 he did develop a fever overnight of 101.2. Encouraged incentive spirometry. No other concerning signs of infection. Urinalysis was obtained and it was negative. PT/OT evaluated again and further recommendation was SNF.   By POD#5, felt that despite  adequate perfusion of left lower extremity there was worsening  of the left 2nd toe and recommended amputation. Consulted podiatry for evaluation. Podiatry also recommended left 2nd toe amputation which was arranged POD#6.   On 02/04/22 patient underwent left 2nd toe amputation by Dr. Jacqualyn [name] He tolerated the surgery well and was taken to recovery room in stable condition. He was started on IV Ancef with some concerns about cellulitis in left groin and toe.   Post op day 7 his left leg remained well perfused with palpable pulse in bypass graft. Continued on Ancef and Flagyl was also started. He did have fever of 101.9 overnight. Blood cultures obtained and also initiated Vancomycin and Zosyn for broad spectrum coverage. No leukocytosis. Podiatry allowing mobility with weightbearing on heel only.   Remainder of his hospital stay consisted of continued pain management, increasing mobilization, IV antibiotics, wound care for left leg incisions and toe amputation site. PT/OT continuing to recommend SNF at discharge.   Wound cultures continued to show no organisms. He completed 6 days of IV vancomycin and Zosyn. He will not discharge on any further course of antibiotics.He remained without leukocytosis, no further fevers and no sources of infection identified post amputation of 2nd toe. His left leg remained well perfused and warm with palpable pulse in bypass graft. Unfortunately his insurance did not approve SNF. Attempted second authorization for SNF but was denied by insurance. Patient okay to discharge home with home DME needs, PT/OT and Home health RN to assist with wound management  By POD#14 he remained stable from podiatry and Vascular surgery standpoint for discharge home.Podiatry recommending 1 week of Augmentin which will be sent to patients pharmacy. PDMP was reviewed and prescription for Oxycodone as well as  rosuvastatin will be sent to patients pharmacy. He will have follow up with vascular  surgery in 2-3 weeks for incision check. He will also follow up with Podiatry, Dr. Jacqualyn Frost, as outpatient in 1-2 weeks.   Consults:  Podiatry  Treatments: antibiotics: vancomycin, Zosyn, metronidazole, and Cefazolin, analgesia: oxycodone, Acetaminophen; anticoagulation: ASA, SQ heparin therapies: PT, OT, and RN, SW and surgery: 1.  Harvest of left leg great saphenous vein 2.  Left common femoral artery to anterior tibial artery bypass with ipsilateral nonreversed great saphenous vein 3. AMPUTATION SECOND TOE (Left)    Disposition: Home   - For VQI Registry use ---  Post-op:  Wound infection: No  Graft infection: No  Transfusion: No  If yes, 0 units given New Arrhythmia: No Patency judged by: [ X] Dopper only, _0  Palpable graft pulse, _1  Palpable distal pulse, _2  ABI inc. > 0.15, _3  Duplex D/C Ambulatory Status: Ambulatory with Assistance  Complications: MI: [ X] No, _4  Troponin only, _5  EKG or Clinical CHF: No Resp failure: Valu.Nieves ] none, _6  Pneumonia, _7  Ventilator Chg in renal function: Valu.Nieves ] none, _8  Inc. Cr > 0.5, _9  Temp. Dialysis, _10  Permanent dialysis Stroke: [ X] None, _11  Minor, _12  Major Return to OR: No  Reason for return to OR: _13  Bleeding, _14  Infection, _15  Thrombosis, _16  Revision  Discharge medications: Statin use:  Yes ASA use:  Yes Plavix use:  No  for medical reason not indicated Beta blocker use: No  for medical reason not indicated Coumadin use: No  for medical reason not indicated    Patient Instructions:  Allergies as of 02/11/2022       Reactions   Cymbalta [duloxetine Hcl] Nausea And Vomiting   Darvocet [propoxyphene N-acetaminophen] Hives        Medication List     STOP taking these medications    fluvastatin XL 80 MG 24 hr tablet Commonly known as: LESCOL XL   meloxicam 7.5 MG tablet Commonly known as: MOBIC   silver sulfADIAZINE 1 % cream Commonly known as: Silvadene   sulfamethoxazole-trimethoprim 800-160 MG  tablet Commonly known as: BACTRIM DS       TAKE these medications    amoxicillin-clavulanate 875-125 MG tablet Commonly known as: AUGMENTIN Take 1 tablet by mouth 2 (two) times daily.   aspirin 81 MG tablet Take 81 mg by mouth daily.   Bayer Contour Next Monitor w/Device Kit Check blood sugar two times daily.   Bayer Contour Next Test test strip Generic drug: glucose blood CHECK BLOOD SUGAR TWICE DAILY   Bayer Microlet Lancets lancets Check blood sugar two times daily.   ciprofloxacin 0.3 % ophthalmic solution Commonly known as: CILOXAN Place 1 drop into both eyes See admin instructions. NSTILL ONE DROP INTO BOTH EYES 4 TIMES A DAY FOR TWO DAYS AFTER EACH MONTHLY EYE INJECTION   fluticasone 50 MCG/ACT nasal spray Commonly known as: FLONASE Place 1 spray into both nostrils daily as needed for allergies or rhinitis.   gabapentin 300 MG capsule Commonly known as: NEURONTIN Take 3 capsules (900 mg total) by mouth 2 (two) times daily.   lisinopril 5 MG tablet Commonly known as: ZESTRIL Take 1 tablet (5 mg total) by mouth daily.   metFORMIN 500 MG 24 hr tablet Commonly known as: GLUCOPHAGE-XR Take 3 tablets (1,500 mg total) by mouth daily with breakfast AND 1 tablet (500 mg total) every evening. 4 tablets daily.  What changed: See the new instructions.   mupirocin ointment 2 % Commonly known as: BACTROBAN Apply 1 Application topically daily.   oxyCODONE 5 MG immediate release tablet Commonly known as: Oxy IR/ROXICODONE Take 1 tablet (5 mg total) by mouth every 4 (four) hours as needed for moderate pain.   pantoprazole 40 MG tablet Commonly known as: PROTONIX TAKE 1 TABLET (40 MG TOTAL) BY MOUTH TWICE A DAY BEFORE MEALS   rosuvastatin 20 MG tablet Commonly known as: CRESTOR Take 1 tablet (20 mg total) by mouth daily.               Durable Medical Equipment  (From admission, onward)           Start     Ordered   01/31/22 0856  For home use only DME  Walker rolling  Once       Question Answer Comment  Walker: With [place] Wheels   Patient needs a walker to treat with the following condition Weakness      01/31/22 0857   01/30/22 1355  For home use only DME Bedside commode  Once       Question:  Patient needs a bedside commode to treat with the following condition  Answer:  Weakness generalized   01/30/22 1355              Discharge Care Instructions  (From admission, onward)           Start     Ordered   02/10/22 0000  Discharge wound care:       Comments: Left leg incisions clean with mild soap and water, pat dry. Do not soak in bathtub  Follow up for wound care instructions by Dr. Jacqualyn [name] Clean incision then apply Xeroform and dry gauze.   02/10/22 1356           Activity: activity as tolerated, no driving while on analgesics, and no heavy lifting for 6 weeks Diet: cardiac diet and low fat, low cholesterol diet Wound Care: Left leg incisions clean with mild soap and water, pat dry. Do not soak in bathtub. Left 2nd toe amputation site clean daily and apply Xeroform and Gauze.  Follow-up with Vascular and Vein in  2-3  weeks.  Signed: Karoline Caldwell PA-C 02/11/2022 1:26 PM

## 2022-02-10 NOTE — TOC Progression Note (Addendum)
Transition of Care (TOC) - Progression Note    Patient Details  Name: [name] MRN: [id number] Date of Birth: Jan 18, 1951  Transition of Care [facility]) CM/SW Contact  Joanne Chars, LCSW Phone Number: 02/10/2022, 11:13 AM  Clinical Narrative:  Josem Kaufmann request submitted in Navi.   1300: TC Terri/[employer].  Pt existing auth from 12/6-12/8 is still valid until midnight tonight.  Terri wanting to know if pt can DC today under existing auth because pt may not get new auth approval due to gains made in his mobility.  She also asked if pt will DC on IV abx.  CSW spoke with Destiny/[facility].  They can accept pt today, discussed the auth information above. CSW spoke wit Dr Carlis Abbott and PA will be by to do DC summary.   1400: CSW spoke with pt and sig other Betty in room, updated on DC today.  Discussed transportation-pt not screened for ambulance transport. [name] is able to provide transport.  1420: message from Destiny/[facility].  She spoke with her business office and they will not accept pt under the auth from last week.  Pt will need to have new auth in order to admit.  Attempted to message PA to discuss.  Pt informed.  CSW spoke with Terri/[employer], updated her that SNF will not accept the existing auth. She will consult with her manager on options.   Expected Discharge Plan: [place] Barriers to Discharge: Continued Medical Work up  Expected Discharge Plan and Services Expected Discharge Plan: [place] In-house Referral: Clinical Social Work     Living arrangements for the past 2 months: Single Family Home                 DME Arranged: Bedside commode, Walker rolling DME Agency: [employer] Date DME Agency Contacted: 01/31/22 Time DME Agency Contacted: 1500 Representative spoke with at DME Agency: Erasmo Downer             Social Determinants of Health ([place]) Interventions    Readmission Risk Interventions     No data to display

## 2022-02-10 NOTE — Progress Notes (Signed)
[facility] [place]  Subjective  -no complaints.  No pain in groin.   Objective 139/65 66 98.6 F (37 C) (Oral) 18 95%  Intake/Output Summary (Last 24 hours) at 02/10/2022 0754 Last data filed at 02/10/2022 0557 Gross per 24 hour  Intake 2127.02 ml  Output 2400 ml  Net -272.98 ml    Left groin with no cellulitis and this has improved Left common femoral to AT bypass with palpable pulse Left leg vein harvest incisions look good  Laboratory Lab Results: No results for input(s): "WBC", "HGB", "HCT", "PLT" in the last 72 hours. BMET Recent Labs    02/09/22 1039  NA 138  K 3.6  CL 105  CO2 22  GLUCOSE 175*  BUN 9  CREATININE 0.69  CALCIUM 8.2*    COAG Lab Results  Component Value Date   INR 1.0 01/29/2022   INR 1.0 08/08/2017   No results found for: "PTT"  Assessment/Planning:  Status post left common femoral to AT bypass with ipsilateral nonreversed great saphenous vein on 01/29/22.  Also required a left second toe amputation.  We kept him through the weekend for IV antibiotics due to some groin cellulitis.  This looks much better.  No fevers.  Okay for discharge to SNF today.  Will arrange follow-up in 2 to 3 weeks for incision checks.  Podiatry following the toe amputation and this looks good.  Aspirin statin for risk reduction.    Marty Heck 02/10/2022 7:54 AM --

## 2022-02-10 NOTE — Care Management Important Message (Signed)
Important Message  Patient Details  Name: [name] MRN: [id number] Date of Birth: 06-10-1950   Medicare Important Message Given:  Yes     [name] 02/10/2022, 11:20 AM

## 2022-02-11 DIAGNOSIS — M86172 Other acute osteomyelitis, left ankle and foot: Secondary | ICD-10-CM | POA: Diagnosis not present

## 2022-02-11 LAB — GLUCOSE, CAPILLARY
Glucose-Capillary: 118 mg/dL — ABNORMAL HIGH (ref 70–99)
Glucose-Capillary: 160 mg/dL — ABNORMAL HIGH (ref 70–99)

## 2022-02-11 MED ORDER — OXYCODONE HCL 5 MG PO TABS: 5.0000 mg | ORAL_TABLET | ORAL | 0 refills | Status: DC | PRN

## 2022-02-11 MED ORDER — ROSUVASTATIN CALCIUM 20 MG PO TABS: 20.0000 mg | ORAL_TABLET | Freq: Every day | ORAL | 11 refills | Status: DC

## 2022-02-11 MED ORDER — AMOXICILLIN-POT CLAVULANATE 875-125 MG PO TABS: 1.0000 | ORAL_TABLET | Freq: Two times a day (BID) | ORAL | 0 refills | Status: DC

## 2022-02-11 NOTE — Progress Notes (Signed)
   PODIATRY PROGRESS NOTE  NAME [name] MRN [id number] DOB 1950-03-16 DOA 01/29/2022   Reason for consult: No chief complaint on file.    History of present illness: 71 y.o. male POD # 7 status post left second toe amputation.  Not having any pain  Vitals:   02/10/22 2025 02/11/22 0049  BP: (!) 140/55 (!) 158/66  Pulse: 65 67  Resp: 20 20  Temp: 98.3 F (36.8 C) 97.7 F (36.5 C)  SpO2:      CBC    Component Value Date/Time   WBC 6.4 02/05/2022 0801   RBC 3.64 (L) 02/05/2022 0801   HGB 11.0 (L) 02/05/2022 0801   HGB 14.9 08/15/2020 1051   HCT 31.3 (L) 02/05/2022 0801   HCT 45.0 08/15/2020 1051   PLT 235 02/05/2022 0801   PLT 154 08/15/2020 1051   MCV 86.0 02/05/2022 0801   MCV 93 08/15/2020 1051   MCH 30.2 02/05/2022 0801   MCHC 35.1 02/05/2022 0801   RDW 11.6 02/05/2022 0801   RDW 12.5 08/15/2020 1051   LYMPHSABS 2.8 08/15/2020 1051   MONOABS 0.5 08/03/2018 0920   EOSABS 0.1 08/15/2020 1051   BASOSABS 0.0 08/15/2020 1051       Latest Ref Rng & Units 02/09/2022   10:39 AM 02/07/2022    1:34 AM 01/30/2022    4:50 AM  BMP  Glucose 70 - 99 mg/dL 175  110  109   BUN 8 - 23 mg/dL '9  12  8   '$ Creatinine 0.61 - 1.24 mg/dL 0.69  0.85  0.83   Sodium 135 - 145 mmol/L 138  135  137   Potassium 3.5 - 5.1 mmol/L 3.6  4.2  3.6   Chloride 98 - 111 mmol/L 105  103  102   CO2 22 - 32 mmol/L '22  22  24   '$ Calcium 8.9 - 10.3 mg/dL 8.2  8.1  8.0     Physical Exam: General: AAOx3, NAD  Dermatology: Status post left second amputation with sutures intact which are well coapted.  There is no drainage or pus.  No edema, erythema or warmth.  No drainage or pus or signs of infection noted otherwise.   Vascular: Foot is warm appears to be perfusing.  Neurological: Decree sensation  Musculoskeletal Exam: No significant pain    ASSESSMENT/PLAN OF CARE  POD # 7 s/p left second toe amputation  -Dressing changed today.  No dressing changes needed until  follow-up -Weightbearing darco wedge shoe which he has -Culture from amputation no growth.  Pathology showed osteomyelitis.  Comfortable we have a surgical cure of osteomyelitis -Can discharge on 1 week of Augmentin twice daily   Lanae Crumbly, DPM 02/11/2022

## 2022-02-11 NOTE — TOC Progression Note (Addendum)
Transition of Care (TOC) - Progression Note    Patient Details  Name: [name]. MRN: [id number] Date of Birth: Oct 05, 1950  Transition of Care [facility]) CM/SW [place], [place] Phone Number: 02/11/2022, 9:56 AM  Clinical Narrative:     CSW called [facility] office and notified that peer to peer is offered for SNF auth. CSW provided peer to peer details; attending would need to call [phone] and select option 5. Attending would need to provide name, dob, and member ID#: [id number]. Must be completed by 1330.   1230: CSW received voicemail from attending that pt's [name] was still denied after peer to peer. RN, RNCM, and Grandaughter notified. RNCM to arrange [facility].   Expected Discharge Plan: [place] Barriers to Discharge: Continued Medical Work up  Expected Discharge Plan and Services Expected Discharge Plan: [place] In-house Referral: Clinical Social Work     Living arrangements for the past 2 months: Single Family Home Expected Discharge Date: 02/10/22               DME Arranged: Bedside commode, Walker rolling DME Agency: [employer] Date DME Agency Contacted: 01/31/22 Time DME Agency Contacted: 1500 Representative spoke with at DME Agency: Erasmo Downer             Social Determinants of Health ([place]) Interventions    Readmission Risk Interventions     No data to display

## 2022-02-11 NOTE — Progress Notes (Addendum)
[facility] [place]  Subjective  - Waiting on new approval for SNF verses home   Objective (!) 158/66 67 97.7 F (36.5 C) (Oral) 20 93%  Intake/Output Summary (Last 24 hours) at 02/11/2022 2542 Last data filed at 02/10/2022 1059 Gross per 24 hour  Intake 1000 ml  Output --  Net 1000 ml   Left groin improved firm healing ridge, no apparent erythema  Left common femoral to AT bypass with palpable pulse  Lungs non labored     Assessment/Planning: 71 y.o. male is s/p:  1.  Harvest of left leg great saphenous vein 2.  Left common femoral artery to anterior tibial artery bypass with ipsilateral nonreversed great saphenous vein   Left second toe amputation site appears viable per DPM note.   Left groin appears improved and no frank hematoma.  I can feel a easily palpable pulse left groin and bypass pulse.  Pending discharge disposition SNF verses home   Roxy Horseman 02/11/2022 7:42 AM --  Laboratory Lab Results: No results for input(s): "WBC", "HGB", "HCT", "PLT" in the last 72 hours. BMET Recent Labs    02/09/22 1039  NA 138  K 3.6  CL 105  CO2 22  GLUCOSE 175*  BUN 9  CREATININE 0.69  CALCIUM 8.2*    COAG Lab Results  Component Value Date   INR 1.0 01/29/2022   INR 1.0 08/08/2017   No results found for: "PTT"   I have seen and evaluated the patient. I agree with the PA note as documented above.  Status post left common femoral to AT bypass with vein for critical limb ischemia with tissue loss.  Plan was discharge to SNF yesterday and they want another approval from his insurance company.  Awaiting decision today otherwise he is comfortable going home.  Toe amputation per podiatry.  Marty Heck, MD [facility] [place] Office: [phone]

## 2022-02-11 NOTE — TOC Progression Note (Signed)
Transition of Care (TOC) - Progression Note    Patient Details  Name: [name] MRN: [id number] Date of Birth: [birth year]  Transition of Care [facility]) CM/SW Contact  Joanne Chars, LCSW Phone Number: 02/11/2022, 9:32 AM  Clinical Narrative:   CSW received phone call from [place] offering peer to peer.  MD call [phone], option #5.  Must be complete by 1330.  TOC team for today informed.      Expected Discharge Plan: [place] Barriers to Discharge: Continued Medical Work up  Expected Discharge Plan and Services Expected Discharge Plan: [place] In-house Referral: Clinical Social Work     Living arrangements for the past 2 months: Single Family Home Expected Discharge Date: 02/10/22               DME Arranged: Bedside commode, Walker rolling DME Agency: [employer] Date DME Agency Contacted: 01/31/22 Time DME Agency Contacted: 1500 Representative spoke with at DME Agency: Erasmo Downer             Social Determinants of Health ([place]) Interventions    Readmission Risk Interventions     No data to display

## 2022-02-11 NOTE — TOC Transition Note (Signed)
Transition of Care (TOC) - CM/SW Discharge Note Marvetta Gibbons RN, BSN Transitions of Care Unit 4E- RN Case Manager See Treatment Team for direct phone #   Patient Details  Name: [name] MRN: [id number] Date of Birth: Jun 25, 1950  Transition of Care [facility]) CM/SW Contact:  Dawayne Patricia, RN Phone Number: 02/11/2022, 1:56 PM   Clinical Narrative:    RNCM received msg from [place] that insurance has denied P2P for SNF stay. Pt will go home w/ HH.   CM spoke with pt and friend [name] at the bedside- List provided for [facility] choice Per CMS guidelines from [email or web address] website with star ratings (copy placed in shadow chart), discussed [place] services and average visits.  After review pt has selected [facility] and [facility] and is agreeable to either one that has available services and can accept referral.   Per pt he is going to stay w/ [name] - who lives just down street from him. Address for [facility]: [address] [place] [address]  Phone: [phone] Pt's mobile phone: [phone]  Reviewed DME needs- RW has been delivered to room by [employer] for pt to take home, per pt he does not feel like he needs BSC at this time, and if needed will f/u to purchase post discharge.   Betty request call to grand-daughterMerleen Nicely- which call was make while CM was at bedside to answer questions with regard to [facility] and transition needs.   Call made to [facility] with [facility]- they do not currently staff for services in [place]- unable to accept referral.  Call made to [facility]- referral has been accepted for HHRN/PT/OT needs- they will contact pt post discharge to schedule.   No further TOC needs noted. RNCM will sign off for now as intervention is no longer needed. Please re-consult  if new needs arise, or contact RNCM assigned to treatment team for further questions/concerns.     Final next level of care: [place] Barriers to Discharge: Barriers Resolved   Patient  Goals and CMS Choice Patient states their goals for this hospitalization and ongoing recovery are:: return home CMS Medicare.gov Compare Post Acute Care list provided to:: Patient Represenative (must comment) (Patients Grandaughter [name]) Choice offered to / list presented to : Patient  Discharge Placement               Home w/ [facility]        Discharge Plan and Services In-house Referral: Clinical Social Work Discharge Planning Services: CM Consult Post Acute Care Choice: Durable Medical Equipment, Home Health          DME Arranged: Bedside commode, Walker rolling DME Agency: [employer] Date DME Agency Contacted: 01/31/22 Time DME Agency Contacted: 1500 Representative spoke with at DME Agency: Erasmo Downer HH Arranged: RN, PT, OT [facility] Agency: [place] Date [place]: 02/11/22 Time [place]: 0762 Representative spoke with at [place]: [place] ([place]) Interventions     Readmission Risk Interventions    02/11/2022    1:56 PM  Readmission Risk Prevention Plan  Post Dischage Appt Complete  Medication Screening Complete  Transportation Screening Complete

## 2022-02-12 ENCOUNTER — Ambulatory Visit: Payer: Self-pay | Admitting: Nurse Practitioner

## 2022-02-12 ENCOUNTER — Telehealth: Payer: Self-pay | Admitting: Podiatry

## 2022-02-12 ENCOUNTER — Telehealth: Payer: Self-pay

## 2022-02-12 NOTE — Patient Outreach (Signed)
  Care Coordination TOC Note Transition Care Management Follow-up Telephone Call Date of discharge and from where: 02/11/22-[place] Dx: "status post peripheral artery bypass" How have you been since you were released from the hospital? Spoke briefly as patient reported his cell phone battery was about to die. Patient states he is doing okay. Currently staying with friend/sig other while he recovers. He voices he rested well last night and has ate well since returning home.His friend is getting ready to go pick up his new meds from the store.  Any questions or concerns? No  Items Reviewed: Did the pt receive and understand the discharge instructions provided? Yes  Medications obtained and verified? No  Other? Yes  Any new allergies since your discharge? No  Dietary orders reviewed? Yes Do you have support at home? Yes   Home Care and Equipment/Supplies: Were home health services ordered? yes If so, what is the name of the agency? [facility]  Has the agency set up a time to come to the patient's home? yes Were any new equipment or medical supplies ordered?  Yes: rolling walker What is the name of the medical supply agency? [facility] Were you able to get the supplies/equipment? yes Do you have any questions related to the use of the equipment or supplies? No  Functional Questionnaire: (I = Independent and D = Dependent) ADLs: A  Bathing/Dressing- I  Meal Prep- A  Eating- I  Maintaining continence- I  Transferring/Ambulation- I  Managing Meds- A  Follow up appointments reviewed:  PCP Hospital f/u appt confirmed?  Patient states that he is switching PCP to one closer to his home-Dr. Livia Snellen. He has an appt on 03/05/22 to establish care. He missed his PCP appt with old PCP that was today. He reports he will call office to see if he an be seen sooner by new PCP. Marland Kitchen [facility] f/u appt confirmed?  Patient wishes to call tomorrow morning after phone charges  . Are transportation  arrangements needed? No  If their condition worsens, is the pt aware to call PCP or go to the Emergency Dept.? Yes Was the patient provided with contact information for the PCP's office or ED? Yes Was to pt encouraged to call back with questions or concerns? Yes  SDOH assessments and interventions completed:   Yes SDOH Interventions Today    Flowsheet Row Most Recent Value  SDOH Interventions   Transportation Interventions Intervention Not Indicated       Care Coordination Interventions:    Education provided  Encounter Outcome:  Pt. Visit Completed     Enzo Montgomery, RN,BSN,CCM [place] Management Telephonic Care Management Coordinator Direct Phone: [phone] Toll Free: [phone] Fax: [phone]

## 2022-02-12 NOTE — Telephone Encounter (Signed)
Lvm for patient to call and schedule an appointment for first op

## 2022-02-14 DIAGNOSIS — M86172 Other acute osteomyelitis, left ankle and foot: Secondary | ICD-10-CM | POA: Diagnosis not present

## 2022-02-14 DIAGNOSIS — Z7984 Long term (current) use of oral hypoglycemic drugs: Secondary | ICD-10-CM | POA: Diagnosis not present

## 2022-02-14 DIAGNOSIS — I10 Essential (primary) hypertension: Secondary | ICD-10-CM | POA: Diagnosis not present

## 2022-02-14 DIAGNOSIS — E1151 Type 2 diabetes mellitus with diabetic peripheral angiopathy without gangrene: Secondary | ICD-10-CM | POA: Diagnosis not present

## 2022-02-14 DIAGNOSIS — Z48812 Encounter for surgical aftercare following surgery on the circulatory system: Secondary | ICD-10-CM | POA: Diagnosis not present

## 2022-02-14 DIAGNOSIS — Z89422 Acquired absence of other left toe(s): Secondary | ICD-10-CM | POA: Diagnosis not present

## 2022-02-14 DIAGNOSIS — Z7982 Long term (current) use of aspirin: Secondary | ICD-10-CM | POA: Diagnosis not present

## 2022-02-14 DIAGNOSIS — E1169 Type 2 diabetes mellitus with other specified complication: Secondary | ICD-10-CM | POA: Diagnosis not present

## 2022-02-14 DIAGNOSIS — Z4781 Encounter for orthopedic aftercare following surgical amputation: Secondary | ICD-10-CM | POA: Diagnosis not present

## 2022-02-17 ENCOUNTER — Ambulatory Visit (INDEPENDENT_AMBULATORY_CARE_PROVIDER_SITE_OTHER): Payer: Medicare HMO | Admitting: Podiatry

## 2022-02-17 ENCOUNTER — Ambulatory Visit (INDEPENDENT_AMBULATORY_CARE_PROVIDER_SITE_OTHER): Payer: Medicare HMO

## 2022-02-17 VITALS — BP 137/66 | HR 91

## 2022-02-17 DIAGNOSIS — L97522 Non-pressure chronic ulcer of other part of left foot with fat layer exposed: Secondary | ICD-10-CM

## 2022-02-17 DIAGNOSIS — Z89422 Acquired absence of other left toe(s): Secondary | ICD-10-CM

## 2022-02-17 NOTE — Progress Notes (Unsigned)
Patient presents today for post op visit # 1, patient of Dr. Jacqualyn Posey.   POV # 1 DOS 02/04/22 Amputation left 2nd toe    He presents in his wedged surgical shoe. Denies any falls or injury to the foot. Foot is swollen. No signs of infection. No calf pain or shortness of breath. Bandages dry and intact. Incision is intact. He is taking his pain medication and antibiotic regularly, as instructed.   BP: 137/66 P: 91    Xrays taken today and reviewed by Dr. Jacqualyn Posey. He did take a look at his foot today as well.   Foot redressed today and placed him back in the boot. Reviewed icing and elevation. She will follow up with Dr. Jacqualyn Posey next week for POV# 2 and possible suture removal.

## 2022-02-17 NOTE — Progress Notes (Unsigned)
Subjective: No chief complaint on file.   Denies any systemic complaints such as fevers, chills, nausea, vomiting. No acute changes since last appointment, and no other complaints at this time.   Objective: AAO x3, NAD DP/PT pulses palpable bilaterally, CRT less than 3 seconds Protective sensation intact with Simms Weinstein monofilament, vibratory sensation intact, Achilles tendon reflex intact No areas of pinpoint bony tenderness or pain with vibratory sensation. MMT 5/5, ROM WNL. No edema, erythema, increase in warmth to bilateral lower extremities.  No open lesions or pre-ulcerative lesions.  No pain with calf compression, swelling, warmth, erythema         Assessment:  Plan: -All treatment options discussed with the patient including all alternatives, risks, complications.   -Patient encouraged to call the office with any questions, concerns, change in symptoms.

## 2022-02-21 ENCOUNTER — Ambulatory Visit: Payer: Medicare HMO | Admitting: Podiatry

## 2022-02-27 ENCOUNTER — Ambulatory Visit (INDEPENDENT_AMBULATORY_CARE_PROVIDER_SITE_OTHER): Payer: Medicare HMO | Admitting: Podiatry

## 2022-02-27 VITALS — BP 133/75 | HR 80

## 2022-02-27 DIAGNOSIS — L97522 Non-pressure chronic ulcer of other part of left foot with fat layer exposed: Secondary | ICD-10-CM

## 2022-02-27 DIAGNOSIS — M86172 Other acute osteomyelitis, left ankle and foot: Secondary | ICD-10-CM | POA: Diagnosis not present

## 2022-02-27 NOTE — Progress Notes (Signed)
Patient presents today for post op visit # 2, patient of Dr.Wagoner.   POV # 2 DOS 02/04/22 left 2nd toe amputation    Patient presents in their wedged post-op shoe. Denies any falls or injury to the foot. Foot is slightly swollen. No signs of infection. No calf pain or shortness of breath. Bandages dry and intact. Incision is intact.  Taking their antibiotic as instructed. Some suture were removed from the incision site and the rest will be removed at the next office visit.    BP: 133/75 P: 80    Dr. Jacqualyn Posey did take a look at his foot today as well.   Foot redressed today and placed in a flat surgical shoe. Reviewed icing and elevation. Patient will follow up with Dr. Jacqualyn Posey next for POV# 3.  - Subjective: Chief Complaint  Patient presents with   Routine Post Op    pov #2 amputation left 2nd toe/ DOS 12.5.2023    [name] is a 72 y.o. is seen today in office s/p left second toe amputation preformed on 02/14/2022.  He has been doing well.  Not having any pain. Objective: General: No acute distress, AAOx3  Appears to be warm and well-perfused.  Sensation decreased. Left foot: Incision is well coapted without any evidence of dehiscence. There is no surrounding erythema, ascending cellulitis, fluctuance, crepitus, malodor, drainage/purulence.  There is trace edema.  There is no pain on exam. No pain with calf compression, swelling, warmth, erythema.   Assessment and Plan:  Status post second toe amputation, doing well with no complications   -Treatment options discussed including all alternatives, risks, and complications -Half of the sutures removed today.  Will plan on removing the rest of them next appointment.  Foot was redressed today.  He is placed into a flat surgical shoe. -Pain medication as needed. -Monitor for any clinical signs or symptoms of infection and DVT/PE and directed to call the office immediately should any occur or go to the ER. -Follow-up as  scheduled or sooner if any problems arise. In the meantime, encouraged to call the office with any questions, concerns, change in symptoms.   Trula Slade DPM

## 2022-02-28 ENCOUNTER — Other Ambulatory Visit: Payer: Medicare HMO

## 2022-03-04 ENCOUNTER — Encounter: Payer: Self-pay | Admitting: Physician Assistant

## 2022-03-04 ENCOUNTER — Ambulatory Visit (INDEPENDENT_AMBULATORY_CARE_PROVIDER_SITE_OTHER): Payer: Medicare HMO | Admitting: Physician Assistant

## 2022-03-04 VITALS — BP 136/90 | HR 76 | Temp 97.6°F | Resp 18 | Ht 70.5 in | Wt 210.0 lb

## 2022-03-04 DIAGNOSIS — I70222 Atherosclerosis of native arteries of extremities with rest pain, left leg: Secondary | ICD-10-CM

## 2022-03-04 NOTE — Progress Notes (Signed)
POST OPERATIVE OFFICE NOTE    CC:  F/u for surgery  HPI:  [name] is a 72 y.o. male who is s/p left common femoral artery to anterior tibial artery bypass with ipsilateral nonreversed greater saphenous vein on 01/29/2022 by Dr. Carlis Abbott.  This was done for diabetic ulcer of the left second toe, which required amputation on 02/04/2022 by Dr. Jacqualyn Posey.  Pt returns today for follow up.  He thinks that most of his incisions have been healing well.  He has had some scabs on his medial leg incisions without drainage.  He denies any swelling or redness that any of his incision sites.  His left second toe amputation site has been healing well, and he is due for suture removal this week by podiatry.  He expresses some concern for "dark" areas around his left lateral calf incision.   Allergies  Allergen Reactions   Cymbalta [Duloxetine Hcl] Nausea And Vomiting   Darvocet [Propoxyphene N-Acetaminophen] Hives    Current Outpatient Medications  Medication Sig Dispense Refill   aspirin 81 MG tablet Take 81 mg by mouth daily.     BAYER CONTOUR NEXT TEST test strip CHECK BLOOD SUGAR TWICE DAILY 100 each 5   BAYER MICROLET LANCETS lancets Check blood sugar two times daily. 100 each 5   Blood Glucose Monitoring Suppl (BAYER CONTOUR NEXT MONITOR) w/Device KIT Check blood sugar two times daily. 1 kit 2   ciprofloxacin (CILOXAN) 0.3 % ophthalmic solution Place 1 drop into both eyes See admin instructions. NSTILL ONE DROP INTO BOTH EYES 4 TIMES A DAY FOR TWO DAYS AFTER EACH MONTHLY EYE INJECTION     fluticasone (FLONASE) 50 MCG/ACT nasal spray Place 1 spray into both nostrils daily as needed for allergies or rhinitis.     lisinopril (ZESTRIL) 5 MG tablet Take 1 tablet (5 mg total) by mouth daily. 90 tablet 3   metFORMIN (GLUCOPHAGE-XR) 500 MG 24 hr tablet Take 3 tablets (1,500 mg total) by mouth daily with breakfast AND 1 tablet (500 mg total) every evening. 4 tablets daily. (Patient taking differently: Take  1000 mg by mouth twice daily) 120 tablet 5   pantoprazole (PROTONIX) 40 MG tablet TAKE 1 TABLET (40 MG TOTAL) BY MOUTH TWICE A DAY BEFORE MEALS 180 tablet 0   rosuvastatin (CRESTOR) 20 MG tablet Take 1 tablet (20 mg total) by mouth daily. 30 tablet 11   gabapentin (NEURONTIN) 300 MG capsule Take 3 capsules (900 mg total) by mouth 2 (two) times daily. 180 capsule 2   No current facility-administered medications for this visit.     ROS:  See HPI  Physical Exam:  Incision: Left groin incision nearly healed with small area of scabbing at lateral edge.  Left medial thigh and medial calf incisions nearly healed with some scabbing.  Left lateral calf incision with some tissue necrosis at the suture line, sutures were removed.  No drainage or erythema noted at any incisions. Extremities: Brisk left AT Doppler signal. Bypass is palpable along LLE Neuro: intact motor and sensation of LLE      Assessment/Plan:  This is a 72 y.o. male who is s/p: left common femoral to AT bypass with GSV on 01/29/2022   -His L groin and L medial leg incisions are nearly healed with some superficial scabbing. These should completely heal with time -The left lateral calf incision at the bypass anastomosis site has some tissue necrosis along the skin edge. The incision was evaluated by [facility] and we are hopeful that  the incision will heal itself. We will follow it as it demarcates. We are hesitant to debride at this time since the incision lays over the bypass -He has a palpable graft pulse and brisk L AT doppler signal. His toe amputation site is healing per podiatry -He will come back to our office in 2-3 weeks for repeat incision check   Vicente Serene, PA-C [facility] [phone]   Clinic MD:  Carlis Abbott

## 2022-03-05 ENCOUNTER — Encounter: Payer: Self-pay | Admitting: Family Medicine

## 2022-03-05 ENCOUNTER — Ambulatory Visit: Payer: Medicare HMO | Admitting: Family Medicine

## 2022-03-05 ENCOUNTER — Ambulatory Visit (INDEPENDENT_AMBULATORY_CARE_PROVIDER_SITE_OTHER): Payer: Medicare HMO | Admitting: Family Medicine

## 2022-03-05 VITALS — BP 140/71 | HR 95 | Temp 98.0°F | Ht 70.5 in | Wt 212.4 lb

## 2022-03-05 DIAGNOSIS — I25119 Atherosclerotic heart disease of native coronary artery with unspecified angina pectoris: Secondary | ICD-10-CM | POA: Insufficient documentation

## 2022-03-05 DIAGNOSIS — E1159 Type 2 diabetes mellitus with other circulatory complications: Secondary | ICD-10-CM

## 2022-03-05 DIAGNOSIS — E1142 Type 2 diabetes mellitus with diabetic polyneuropathy: Secondary | ICD-10-CM | POA: Insufficient documentation

## 2022-03-05 DIAGNOSIS — I739 Peripheral vascular disease, unspecified: Secondary | ICD-10-CM | POA: Diagnosis not present

## 2022-03-05 DIAGNOSIS — Z7984 Long term (current) use of oral hypoglycemic drugs: Secondary | ICD-10-CM | POA: Diagnosis not present

## 2022-03-05 DIAGNOSIS — I152 Hypertension secondary to endocrine disorders: Secondary | ICD-10-CM

## 2022-03-05 DIAGNOSIS — Z89422 Acquired absence of other left toe(s): Secondary | ICD-10-CM | POA: Diagnosis not present

## 2022-03-05 DIAGNOSIS — E1169 Type 2 diabetes mellitus with other specified complication: Secondary | ICD-10-CM | POA: Diagnosis not present

## 2022-03-05 DIAGNOSIS — Z9889 Other specified postprocedural states: Secondary | ICD-10-CM | POA: Diagnosis not present

## 2022-03-05 DIAGNOSIS — E785 Hyperlipidemia, unspecified: Secondary | ICD-10-CM

## 2022-03-05 LAB — BAYER DCA HB A1C WAIVED: HB A1C (BAYER DCA - WAIVED): 8 % — ABNORMAL HIGH (ref 4.8–5.6)

## 2022-03-05 MED ORDER — METFORMIN HCL ER 500 MG PO TB24: ORAL_TABLET | ORAL | 1 refills | Status: DC

## 2022-03-05 MED ORDER — GABAPENTIN 300 MG PO CAPS: 900.0000 mg | ORAL_CAPSULE | Freq: Two times a day (BID) | ORAL | 2 refills | Status: DC

## 2022-03-05 NOTE — Progress Notes (Signed)
Subjective:  Patient ID: [name], male    DOB: 24-Apr-1950, 72 y.o.   MRN: [id number]  Patient Care Team: Baruch Gouty, FNP as PCP - General (Family Medicine) Elayne Snare, MD as Consulting Physician (Endocrinology) Leta Baptist, MD as Consulting Physician (Otolaryngology) [facility] Hayden Pedro, MD as Consulting Physician (Ophthalmology) Tresa Garter, MD as Consulting Physician (Internal Medicine)   Chief Complaint:  New Patient (Initial Visit) [facility]/  /) and Establish Care   HPI: [name] is a 72 y.o. male presenting on 03/05/2022 for New Patient (Initial Visit) ([facility]/  /) and Establish Care   Pt presents today to establish care with new provider. He has a complex medical history including T2DM with associated hyperlipidemia, hypertension, PVD, and neuropathy. He recently underwent left second toe amputation due to critical limb ischemia along with anterior tibial artery bypass. He is followed by vascular surgery and podiatry on a regular basis. He is healing well from procedures but still has would to left lateral lower leg. He is on gabapentin for his neuropathy but feels he may not require this once he heals from surgery. Currently on monotherpay for diabetes control. No recent A1C. States blood sugars are running in the 80-114 range on a regular basis, was high this morning at 148. He denies polyuria, polyphagia, or polydipsia. No weight or visual changes. He does see Dr. Zigmund Daniel and Dr. Katy Fitch for his diabetic and hypertensive retinopathy. Has follow up with them scheduled. He is on ASA, statin, and ACEi therapy. Tolerates medications well without associated side effects. [facility] is currently in the home helping pt with rehab post surgery.   States vaccinations should be up to date as he had them at [facility]. Pt to bring vaccination card to next visit.     Relevant past  medical, surgical, family, and social history reviewed and updated as indicated.  Allergies and medications reviewed and updated. Data reviewed: Chart in Epic.   Past Medical History:  Diagnosis Date   Allergy    seasonal   Arthritis    " all over me- back, hips, knees, hands, everywhere "   Cancer [facility])    Prostate cancer age 61; Lupron treatments followed by prostatectomy.   Cataract    left removed, forming right   Chronic constipation    x 23 yrs after prostate surgery   Diabetes mellitus without complication (HCC)    Diabetic retinal damage of both eyes ([place])    gets shots every month   GERD (gastroesophageal reflux disease)    Hyperlipidemia    Hypertension    Myocardial infarction [facility])    age 39; no stenting.   OSA (obstructive sleep apnea)    non-compliant with CPAP.   Sleep apnea    no cpap    Past Surgical History:  Procedure Laterality Date   ABDOMINAL AORTOGRAM W/LOWER EXTREMITY N/A 01/17/2022   Procedure: ABDOMINAL AORTOGRAM W/LOWER EXTREMITY;  Surgeon: Angelia Mould, MD;  Location: [place] [facility];  Service: Cardiovascular;  Laterality: N/A;   AMPUTATION TOE Left 02/04/2022   Procedure: AMPUTATION  SECOND TOE;  Surgeon: Trula Slade, DPM;  Location: [place];  Service: Podiatry;  Laterality: Left;   APPENDECTOMY     CHOLECYSTECTOMY     FEMORAL-TIBIAL BYPASS GRAFT Left 01/29/2022   Procedure: LEFT COMMON FEMORAL BYPASS TO ANTERIOR TIBIAL ARTERY;  Surgeon: Marty Heck, MD;  Location: [facility];  Service: Vascular;  Laterality: Left;   PROSTATE SURGERY     VEIN HARVEST Left 01/29/2022   Procedure: HARVEST OF LEFT LEG GREAT SAPHENOUS VEIN;  Surgeon: Marty Heck, MD;  Location: [facility] OR;  Service: Vascular;  Laterality: Left;    Social History   Socioeconomic History   Marital status: Single    Spouse name: GF-Betty   Number of children: 2   Years of education: 11   Highest education level: Not on file  Occupational History    Occupation: supervisor    Employer: [employer]   Tobacco Use   Smoking status: Never   Smokeless tobacco: Former    Types: Nurse, children's Use: Never used  Substance and Sexual Activity   Alcohol use: Yes    Comment: social   Drug use: Not Currently   Sexual activity: Not Currently  Other Topics Concern   Not on file  Social History Narrative   Marital status: divorced, girlfriend x 1977     Children: 1      Tobacco: none; chew tobacco      Alcohol: beers on weekends      Exercise: none   Social Determinants of Health   Financial Resource Strain: Low Risk  (11/25/2020)   Overall Financial Resource Strain (CARDIA)    Difficulty of Paying Living Expenses: Not hard at all  Food Insecurity: Food Insecurity Present (02/06/2022)   Hunger Vital Sign    Worried About [place] in the Last Year: Sometimes true    Ran Out of Food in the Last Year: Sometimes true  Transportation Needs: No Transportation Needs (02/12/2022)   PRAPARE - Hydrologist (Medical): No    Lack of Transportation (Non-Medical): No  Physical Activity: Insufficiently Active (11/25/2020)   Exercise Vital Sign    Days of Exercise per Week: 7 days    Minutes of Exercise per Session: 10 min  Stress: Stress Concern Present (11/25/2020)   [place]    Feeling of Stress : To some extent  Social Connections: Moderately Isolated (11/25/2020)   Social Connection and Isolation Panel [NHANES]    Frequency of Communication with Friends and Family: More than three times a week    Frequency of Social Gatherings with Friends and Family: More than three times a week    Attends Religious Services: More than 4 times per year    Active Member of [employer] or Organizations: No    Attends Archivist Meetings: Never    Marital Status: Divorced  Human resources officer Violence: Not At Risk (02/06/2022)    Humiliation, Afraid, Rape, and Kick questionnaire    Fear of Current or Ex-Partner: No    Emotionally Abused: No    Physically Abused: No    Sexually Abused: No    Outpatient Encounter Medications as of 03/05/2022  Medication Sig   aspirin 81 MG tablet Take 81 mg by mouth daily.   BAYER CONTOUR NEXT TEST test strip CHECK BLOOD SUGAR TWICE DAILY   BAYER MICROLET LANCETS lancets Check blood sugar two times daily.   Blood Glucose Monitoring Suppl (BAYER CONTOUR NEXT MONITOR) w/Device KIT Check blood sugar two times daily.   ciprofloxacin (CILOXAN) 0.3 % ophthalmic solution Place 1 drop into both eyes See admin instructions. NSTILL ONE DROP INTO BOTH EYES 4 TIMES A DAY FOR TWO DAYS AFTER EACH MONTHLY EYE INJECTION  fluticasone (FLONASE) 50 MCG/ACT nasal spray Place 1 spray into both nostrils daily as needed for allergies or rhinitis.   lisinopril (ZESTRIL) 5 MG tablet Take 1 tablet (5 mg total) by mouth daily.   pantoprazole (PROTONIX) 40 MG tablet TAKE 1 TABLET (40 MG TOTAL) BY MOUTH TWICE A DAY BEFORE MEALS   rosuvastatin (CRESTOR) 20 MG tablet Take 1 tablet (20 mg total) by mouth daily.   [DISCONTINUED] metFORMIN (GLUCOPHAGE-XR) 500 MG 24 hr tablet Take 3 tablets (1,500 mg total) by mouth daily with breakfast AND 1 tablet (500 mg total) every evening. 4 tablets daily. (Patient taking differently: Take 1000 mg by mouth twice daily)   gabapentin (NEURONTIN) 300 MG capsule Take 3 capsules (900 mg total) by mouth 2 (two) times daily.   metFORMIN (GLUCOPHAGE-XR) 500 MG 24 hr tablet Take 2 tablets (1,000 mg total) by mouth daily with breakfast AND 2 tablets (1,000 mg total) every evening. 4 tablets daily.   [DISCONTINUED] gabapentin (NEURONTIN) 300 MG capsule Take 3 capsules (900 mg total) by mouth 2 (two) times daily.   No facility-administered encounter medications on file as of 03/05/2022.    Allergies  Allergen Reactions   Cymbalta [Duloxetine Hcl] Nausea And Vomiting   Darvocet [Propoxyphene  N-Acetaminophen] Hives    Review of Systems  Constitutional:  Negative for activity change, appetite change, chills, diaphoresis, fatigue, fever and unexpected weight change.  HENT: Negative.    Eyes: Negative.  Negative for photophobia.  Respiratory:  Negative for cough, chest tightness and shortness of breath.   Cardiovascular:  Negative for chest pain, palpitations and leg swelling.  Gastrointestinal:  Negative for abdominal pain, blood in stool, constipation, diarrhea, nausea and vomiting.  Endocrine: Negative.  Negative for polydipsia, polyphagia and polyuria.  Genitourinary:  Negative for decreased urine volume, difficulty urinating, dysuria, frequency and urgency.  Musculoskeletal:  Positive for arthralgias and myalgias.  Skin: Negative.   Allergic/Immunologic: Negative.   Neurological:  Negative for dizziness, tremors, seizures, syncope, facial asymmetry, speech difficulty, weakness, light-headedness, numbness and headaches.  Hematological: Negative.   Psychiatric/Behavioral:  Negative for confusion, hallucinations, sleep disturbance and suicidal ideas.   All other systems reviewed and are negative.       Objective:  BP (!) 140/71   Pulse 95   Temp 98 F (36.7 C) (Temporal)   Ht 5' 10.5" (1.791 m)   Wt 212 lb 6.4 oz (96.3 kg)   SpO2 95%   BMI 30.05 kg/m    Wt Readings from Last 3 Encounters:  03/05/22 212 lb 6.4 oz (96.3 kg)  03/04/22 210 lb (95.3 kg)  02/01/22 221 lb 12.5 oz (100.6 kg)    Physical Exam Vitals and nursing note reviewed.  Constitutional:      General: He is not in acute distress.    Appearance: Normal appearance. He is obese. He is not ill-appearing, toxic-appearing or diaphoretic.  HENT:     Head: Normocephalic and atraumatic.     Mouth/Throat:     Mouth: Mucous membranes are moist.  Eyes:     Conjunctiva/sclera: Conjunctivae normal.     Pupils: Pupils are equal, round, and reactive to light.  Cardiovascular:     Rate and Rhythm: Normal  rate and regular rhythm.     Pulses: Normal pulses.  Pulmonary:     Effort: Pulmonary effort is normal.     Breath sounds: Normal breath sounds.  Musculoskeletal:     Right lower leg: No edema.     Left lower leg: No edema.  Feet:     Comments: Post op shoe and dressing to left foot Skin:    General: Skin is warm and dry.     Capillary Refill: Capillary refill takes less than 2 seconds.     Findings: Erythema and wound present.       Neurological:     General: No focal deficit present.     Mental Status: He is alert and oriented to person, place, and time.  Psychiatric:        Mood and Affect: Mood normal.        Behavior: Behavior normal.        Thought Content: Thought content normal.        Judgment: Judgment normal.     Results for orders placed or performed in visit on 03/05/22  Bayer DCA Hb A1c Waived  Result Value Ref Range   HB A1C (BAYER DCA - WAIVED) 8.0 (H) 4.8 - 5.6 %       Pertinent labs & imaging results that were available during my care of the patient were reviewed by me and considered in my medical decision making.  Assessment & Plan:  Jaramiah was seen today for new patient (initial visit) and establish care.  Diagnoses and all orders for this visit:  Type 2 diabetes mellitus with other specified complication, without long-term current use of insulin (HCC) A1C 8.0 today, pt to make dietary changes. If still elevated at next visit, will adjust regimen.  -     metFORMIN (GLUCOPHAGE-XR) 500 MG 24 hr tablet; Take 2 tablets (1,000 mg total) by mouth daily with breakfast AND 2 tablets (1,000 mg total) every evening. 4 tablets daily. -     CMP14+EGFR -     CBC with Differential/Platelet -     Lipid panel -     Bayer DCA Hb A1c Waived -     Microalbumin / creatinine urine ratio -     Thyroid Panel With TSH  Diabetic polyneuropathy associated with type 2 diabetes mellitus ([place]) Doing well on below, will continue. Pt feels he may not need once he heals from  recent surgeries.  -     gabapentin (NEURONTIN) 300 MG capsule; Take 3 capsules (900 mg total) by mouth 2 (two) times daily.  Hypertension associated with type 2 diabetes mellitus (HCC) BP fairly controlled. Changes were made in regimen today. Goal BP is 130/80. Pt aware to report any persistent high or low readings. DASH diet and exercise encouraged. Exercise at least 150 minutes per week and increase as tolerated. Goal BMI > 25. Stress management encouraged. Avoid nicotine and tobacco product use. Avoid excessive alcohol and NSAID's. Avoid more than 2000 mg of sodium daily. Medications as prescribed. Follow up as scheduled.  -     CMP14+EGFR -     CBC with Differential/Platelet -     Lipid panel -     Microalbumin / creatinine urine ratio -     Thyroid Panel With TSH  Hyperlipidemia associated with type 2 diabetes mellitus ([place]) Diet encouraged - increase intake of fresh fruits and vegetables, increase intake of lean proteins. Bake, broil, or grill foods. Avoid fried, greasy, and fatty foods. Avoid fast foods. Increase intake of fiber-rich whole grains. Exercise encouraged - at least 150 minutes per week and advance as tolerated.  Goal BMI < 25. Continue medications as prescribed. Follow up in 3-6 months as discussed.  -     CMP14+EGFR -     Lipid panel  PAD (  peripheral artery disease) (HCC) S/P amputation of lesser toe, left (HCC) Status post peripheral artery bypass Followed by vascular surgery and podiatry on a regular basis. Healing well from recent procedures. Wound to left lateral lower leg still healing, scabbing, minimal surrounding erythema, no swelling or drainage.  -     CMP14+EGFR -     CBC with Differential/Platelet -     Lipid panel   Continue all other maintenance medications.  Follow up plan: Return in about 3 months (around 06/04/2022), or if symptoms worsen or fail to improve, for DM.   Continue healthy lifestyle choices, including diet (rich in fruits, vegetables,  and lean proteins, and low in salt and simple carbohydrates) and exercise (at least 30 minutes of moderate physical activity daily).  Educational handout given for DM  The above assessment and management plan was discussed with the patient. The patient verbalized understanding of and has agreed to the management plan. Patient is aware to call the clinic if they develop any new symptoms or if symptoms persist or worsen. Patient is aware when to return to the clinic for a follow-up visit. Patient educated on when it is appropriate to go to the emergency department.   Monia Pouch, FNP-C [facility] [phone]

## 2022-03-05 NOTE — Patient Instructions (Signed)

## 2022-03-06 LAB — CBC WITH DIFFERENTIAL/PLATELET
Basophils Absolute: 0 10*3/uL (ref 0.0–0.2)
Basos: 1 %
EOS (ABSOLUTE): 0.3 10*3/uL (ref 0.0–0.4)
Eos: 4 %
Hematocrit: 40 % (ref 37.5–51.0)
Hemoglobin: 13.3 g/dL (ref 13.0–17.7)
Immature Grans (Abs): 0 10*3/uL (ref 0.0–0.1)
Immature Granulocytes: 0 %
Lymphocytes Absolute: 2.6 10*3/uL (ref 0.7–3.1)
Lymphs: 36 %
MCH: 29.1 pg (ref 26.6–33.0)
MCHC: 33.3 g/dL (ref 31.5–35.7)
MCV: 88 fL (ref 79–97)
Monocytes Absolute: 0.5 10*3/uL (ref 0.1–0.9)
Monocytes: 7 %
Neutrophils Absolute: 3.8 10*3/uL (ref 1.4–7.0)
Neutrophils: 52 %
Platelets: 156 10*3/uL (ref 150–450)
RBC: 4.57 x10E6/uL (ref 4.14–5.80)
RDW: 12.5 % (ref 11.6–15.4)
WBC: 7.3 10*3/uL (ref 3.4–10.8)

## 2022-03-06 LAB — CMP14+EGFR
ALT: 15 IU/L (ref 0–44)
AST: 19 IU/L (ref 0–40)
Albumin/Globulin Ratio: 1.9 (ref 1.2–2.2)
Albumin: 4.4 g/dL (ref 3.9–4.9)
Alkaline Phosphatase: 113 IU/L (ref 44–121)
BUN/Creatinine Ratio: 16 (ref 10–24)
BUN: 13 mg/dL (ref 8–27)
Bilirubin Total: 0.7 mg/dL (ref 0.0–1.2)
CO2: 21 mmol/L (ref 20–29)
Calcium: 9.3 mg/dL (ref 8.6–10.2)
Chloride: 103 mmol/L (ref 96–106)
Creatinine, Ser: 0.8 mg/dL (ref 0.76–1.27)
Globulin, Total: 2.3 g/dL (ref 1.5–4.5)
Glucose: 177 mg/dL — ABNORMAL HIGH (ref 70–99)
Potassium: 4.3 mmol/L (ref 3.5–5.2)
Sodium: 142 mmol/L (ref 134–144)
Total Protein: 6.7 g/dL (ref 6.0–8.5)
eGFR: 96 mL/min/{1.73_m2} (ref >59)

## 2022-03-06 LAB — MICROALBUMIN / CREATININE URINE RATIO
Creatinine, Urine: 240 mg/dL
Microalb/Creat Ratio: 20 mg/g creat (ref 0–29)
Microalbumin, Urine: 47.5 ug/mL

## 2022-03-06 LAB — THYROID PANEL WITH TSH
Free Thyroxine Index: 2.2 (ref 1.2–4.9)
T3 Uptake Ratio: 25 % (ref 24–39)
T4, Total: 8.9 ug/dL (ref 4.5–12.0)
TSH: 2.89 u[IU]/mL (ref 0.450–4.500)

## 2022-03-06 LAB — LIPID PANEL
Chol/HDL Ratio: 2.9 ratio (ref 0.0–5.0)
Cholesterol, Total: 150 mg/dL (ref 100–199)
HDL: 51 mg/dL (ref >39)
LDL Chol Calc (NIH): 80 mg/dL (ref 0–99)
Triglycerides: 101 mg/dL (ref 0–149)
VLDL Cholesterol Cal: 19 mg/dL (ref 5–40)

## 2022-03-06 NOTE — Addendum Note (Signed)
Addended by: Baruch Gouty on: 03/06/2022 10:37 AM   Modules accepted: Orders

## 2022-03-07 ENCOUNTER — Ambulatory Visit (INDEPENDENT_AMBULATORY_CARE_PROVIDER_SITE_OTHER): Payer: Medicare HMO | Admitting: Podiatry

## 2022-03-07 VITALS — BP 146/75 | HR 70

## 2022-03-07 DIAGNOSIS — Z89422 Acquired absence of other left toe(s): Secondary | ICD-10-CM

## 2022-03-07 DIAGNOSIS — L97522 Non-pressure chronic ulcer of other part of left foot with fat layer exposed: Secondary | ICD-10-CM

## 2022-03-07 DIAGNOSIS — M86172 Other acute osteomyelitis, left ankle and foot: Secondary | ICD-10-CM

## 2022-03-10 NOTE — Progress Notes (Signed)
   Subjective: Chief Complaint  Patient presents with   Routine Post Op    POV #3  DOS 02/04/2022 Amputation left 2nd toe - patient states foot is feeling good, no pain, no NVFC, tenderness in leg from vascular procedure, sutures removed today, moisturized foot with surgical lotion    [name] is a 72 y.o. is seen today in office s/p left second toe amputation preformed on 02/14/2022.  States he is continue to do well without any issues.  Denies any drainage or pus.  No fever or chills.   Objective: General: No acute distress, AAOx3  Appears to be warm and well-perfused.  Sensation decreased. Left foot: Incision is well coapted without any evidence of dehiscence.  Appears the incision is healed.  There is no surrounding erythema, ascending cellulitis.  No fluctuance or crepitation there is no malodor.  No new ulcerations are noted. No pain with calf compression, swelling, warmth, erythema.   Assessment and Plan:  Status post second toe amputation, doing well with no complications   -Treatment options discussed including all alternatives, risks, and complications -All the sutures were removed and incision appears to be healing well.  Still remain in the surgical shoe and continue daily dressing changes for now.   -Monitor for any clinical signs or symptoms of infection and directed to call the office immediately should any occur or go to the ER.  Trula Slade DPM

## 2022-03-17 ENCOUNTER — Ambulatory Visit: Payer: Self-pay | Admitting: Nurse Practitioner

## 2022-03-18 ENCOUNTER — Ambulatory Visit (INDEPENDENT_AMBULATORY_CARE_PROVIDER_SITE_OTHER): Payer: Medicare HMO | Admitting: Physician Assistant

## 2022-03-18 VITALS — BP 175/91 | HR 80 | Temp 98.0°F | Resp 20 | Ht 70.5 in | Wt 214.9 lb

## 2022-03-18 DIAGNOSIS — I70222 Atherosclerosis of native arteries of extremities with rest pain, left leg: Secondary | ICD-10-CM

## 2022-03-18 NOTE — Progress Notes (Signed)
POST OPERATIVE OFFICE NOTE    CC:  F/u for surgery  HPI:  History of Present Illness:  Patient is a 72 y.o. year old male who presents for evaluation of PAD with history of left second toe ulcer.  He is here for follow up visit s/p Left common femoral artery to anterior tibial artery bypass with ipsilateral nonreversed great saphenous vein followed by second toe amputation by DPM.  His toe amputation site has fully healed.  The lower leg medial incision is healing well with incisional scabs.  The lateral leg incision has larger scab.  He denise drainage,openings at these incisions, fever and chills.  He states he does walk a lot and he gets tightness with swelling in the left LE.  He denise short distance claudication and no new wounds.  Pt returns today for follow up incision checks   Allergies  Allergen Reactions   Cymbalta [Duloxetine Hcl] Nausea And Vomiting   Darvocet [Propoxyphene N-Acetaminophen] Hives    Current Outpatient Medications  Medication Sig Dispense Refill   aspirin 81 MG tablet Take 81 mg by mouth daily.     BAYER CONTOUR NEXT TEST test strip CHECK BLOOD SUGAR TWICE DAILY 100 each 5   BAYER MICROLET LANCETS lancets Check blood sugar two times daily. 100 each 5   Blood Glucose Monitoring Suppl (BAYER CONTOUR NEXT MONITOR) w/Device KIT Check blood sugar two times daily. 1 kit 2   ciprofloxacin (CILOXAN) 0.3 % ophthalmic solution Place 1 drop into both eyes See admin instructions. NSTILL ONE DROP INTO BOTH EYES 4 TIMES A DAY FOR TWO DAYS AFTER EACH MONTHLY EYE INJECTION     fluticasone (FLONASE) 50 MCG/ACT nasal spray Place 1 spray into both nostrils daily as needed for allergies or rhinitis.     gabapentin (NEURONTIN) 300 MG capsule Take 3 capsules (900 mg total) by mouth 2 (two) times daily. 180 capsule 2   lisinopril (ZESTRIL) 5 MG tablet Take 1 tablet (5 mg total) by mouth daily. 90 tablet 3   metFORMIN (GLUCOPHAGE-XR) 500 MG 24 hr tablet Take 2 tablets (1,000 mg  total) by mouth daily with breakfast AND 2 tablets (1,000 mg total) every evening. 4 tablets daily. 360 tablet 1   pantoprazole (PROTONIX) 40 MG tablet TAKE 1 TABLET (40 MG TOTAL) BY MOUTH TWICE A DAY BEFORE MEALS 180 tablet 0   rosuvastatin (CRESTOR) 20 MG tablet Take 1 tablet (20 mg total) by mouth daily. 30 tablet 11   No current facility-administered medications for this visit.     ROS:  See HPI  Physical Exam:         Incision:  lower leg medial incision is healing well with incisional scabs.  The lateral leg incision has larger scab.  No drainage or dehiscence.  Left groin has healed well.  Second toe amputation site has fully healed Extremities:  Doppler signal DP/weak PT.  Palpable bypass graft pulse lateral left leg.   Neuro: known peripheral neuropathy     Assessment/Plan:  This is a 72 y.o. male who has PAD with non healing ulcer s/p: Left common femoral artery to anterior tibial artery bypass with ipsilateral nonreversed great saphenous vein    He is doing well over all.  The bypass is palpable and patent.  The lower leg has edema.  I discussed the importance of elevation multiple times a day to help with edema.  The lateral lower leg incision has a scab that is intact without signs of infection. He  has not been showering until yesterday.  He was not sure if he could get the incisions wet.  He should shower daily or every other day pending his routine.  The incisions are safe to get wet.   Elevation 15 min at a time for edema.  Increases walking activity as tolerates.    F/u in 1 months with bypass duplex and ABI for new baseline.  If he develops problems he will call our office.    Roxy Horseman PA-C [facility] [phone]   Clinic MD:  Carlis Abbott

## 2022-04-08 ENCOUNTER — Ambulatory Visit: Payer: Medicare HMO

## 2022-04-08 ENCOUNTER — Ambulatory Visit (INDEPENDENT_AMBULATORY_CARE_PROVIDER_SITE_OTHER): Payer: Medicare HMO | Admitting: Podiatry

## 2022-04-08 DIAGNOSIS — Z89422 Acquired absence of other left toe(s): Secondary | ICD-10-CM

## 2022-04-08 DIAGNOSIS — M86172 Other acute osteomyelitis, left ankle and foot: Secondary | ICD-10-CM | POA: Diagnosis not present

## 2022-04-08 DIAGNOSIS — L97522 Non-pressure chronic ulcer of other part of left foot with fat layer exposed: Secondary | ICD-10-CM | POA: Diagnosis not present

## 2022-04-08 DIAGNOSIS — Z9889 Other specified postprocedural states: Secondary | ICD-10-CM

## 2022-04-08 DIAGNOSIS — E1142 Type 2 diabetes mellitus with diabetic polyneuropathy: Secondary | ICD-10-CM

## 2022-04-08 NOTE — Progress Notes (Signed)
   Subjective: Chief Complaint  Patient presents with   Routine Post Op    RM 14  pov #4 amputation left 2nd toe/ DOS 12.5.2023. Pt states he feel tightness in his foot only at night when he prompt his foot.      [name] is a 72 y.o. is seen today in office s/p left second toe amputation preformed on 02/14/2022.  He states this has been healing well.  He has no new ulcerations on his foot.  He still follows with vascular surgery as well.    He also feels there is something wrapped around his feet.  He is taking gabapentin 2 tablets twice a day.  But the prescription is written as 3 tablets twice a day.  Objective: General: No acute distress, AAOx3  Appears to be warm and well-perfused.   Sensation decreased. Left foot: Incision is well coapted without any evidence of dehiscence.  Scar is formed.  There is no open lesions.  There is no edema, erythema or signs of infection.  No open lesions noted.   No pain with calf compression, swelling, warmth, erythema.        Assessment and Plan:  Status post second toe amputation, doing well with no complications; neuropathy  -Treatment options discussed including all alternatives, risks, and complications -Incisions healed well.  Currently no signs of infection to the foot.  Do think benefit from diabetic shoes.  Will get him measured for this. -Discussed taking gabapentin as prescribed to see if this is helpful.  Not consider changing medication to possibly Lyrica. -Monitor for any clinical signs or symptoms of infection and directed to call the office immediately should any occur or go to the ER.  Trula Slade DPM

## 2022-04-10 ENCOUNTER — Other Ambulatory Visit: Payer: Self-pay | Admitting: *Deleted

## 2022-04-10 DIAGNOSIS — M79673 Pain in unspecified foot: Secondary | ICD-10-CM

## 2022-04-10 DIAGNOSIS — I70222 Atherosclerosis of native arteries of extremities with rest pain, left leg: Secondary | ICD-10-CM

## 2022-04-15 ENCOUNTER — Encounter: Payer: Self-pay | Admitting: Nutrition

## 2022-04-15 ENCOUNTER — Encounter: Payer: Medicare HMO | Attending: Family Medicine | Admitting: Nutrition

## 2022-04-15 VITALS — Ht 70.5 in | Wt 220.0 lb

## 2022-04-15 DIAGNOSIS — E118 Type 2 diabetes mellitus with unspecified complications: Secondary | ICD-10-CM | POA: Insufficient documentation

## 2022-04-15 NOTE — Progress Notes (Signed)
Medical Nutrition Therapy  Appointment Start time:    0800  Appointment End time:  0930  Primary concerns today: Dm Type 2, Obesity  Referral diagnosis: E11.8, E Preferred learning style: No preference  Learning readiness: Ready    NUTRITION ASSESSMENT  72 yr old wmale with Type 2 DM, Obesity, Hyperlipidemia, HTN and s/p toe amputation. He notes his left side of this left leg is not healing as well. He c/o of chronic neuropathy that is keeping him up at night and limiting his mobility.  He retired 3 yrs ago and hasn't been able to enjoy his retirement due to health issues. FBS: 186 this am:  Ranges:  110-180's.  Uncontrolled blood sugars with A1C of 8% after his toe surgery. 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  He notes he eats randomly. Skips meals at times and sometimes only eats 1-2 times per day. Complains of chronic fatigue, no energy, chronic pain in lower legs  He is wiling to work on lifestyle medicine to help improve his health and reverse his Dm and improve his chronic health issues and improve wound healing.  Anthropometrics  Wt Readings from Last 3 Encounters:  04/15/22 220 lb (99.8 kg)  03/18/22 214 lb 14.4 oz (97.5 kg)  03/05/22 212 lb 6.4 oz (96.3 kg)   Ht Readings from Last 3 Encounters:  04/15/22 5' 10.5" (1.791 m)  03/18/22 5' 10.5" (1.791 m)  03/05/22 5' 10.5" (1.791 m)   Body mass index is 31.12 kg/m. @BMIFA$ @ Facility age limit for growth %iles is 20 years. Facility age limit for growth %iles is 20 years.    Clinical Medical Hx: See chart Medications: Metformin 500 mg BID Labs:  Lab Results  Component Value Date   HGBA1C 8.0 (H) 03/05/2022  Lipid Panel     Component Value Date/Time   CHOL 150 03/05/2022 0949   TRIG 101 03/05/2022 0949   HDL 51 03/05/2022 0949   CHOLHDL 2.9 03/05/2022 0949   CHOLHDL 4.4 01/30/2022 0450   VLDL 17 01/30/2022 0450   LDLCALC 80 03/05/2022 0949    LDLDIRECT 112.0 08/03/2018 0920   LABVLDL 19 03/05/2022 0949      Latest Ref Rng & Units 03/05/2022    9:49 AM 02/09/2022   10:39 AM 02/07/2022    1:34 AM  CMP  Glucose 70 - 99 mg/dL 177  175  110   BUN 8 - 27 mg/dL 13  9  12   $ Creatinine 0.76 - 1.27 mg/dL 0.80  0.69  0.85   Sodium 134 - 144 mmol/L 142  138  135   Potassium 3.5 - 5.2 mmol/L 4.3  3.6  4.2   Chloride 96 - 106 mmol/L 103  105  103   CO2 20 - 29 mmol/L 21  22  22   $ Calcium 8.6 - 10.2 mg/dL 9.3  8.2  8.1   Total Protein 6.0 - 8.5 g/dL 6.7     Total Bilirubin 0.0 - 1.2 mg/dL 0.7     Alkaline Phos 44 - 121 IU/L 113     AST 0 - 40 IU/L 19     ALT 0 - 44 IU/L 15        Notable Signs/Symptoms: chronic pain, fatigue,   Lifestyle & Dietary Hx LIves by himself Surgery on toe 1'/2024  Estimated daily fluid intake: 32 oz Supplements:  Sleep: 7-10 Stress / self-care: recent surgery Current average weekly physical activity: ADL  24-Hr Dietary Recall First Meal: 11a m yogurt, rasin bran cereal with % milk., Snack:  Second Meal:  Snack:  Third Meal: 5 pm; Steak and cheese 12", water 8 oz Snack: unsweet tea,  9 pm  3 crackers with sf jelly Beverages: water, tea  Estimated Energy Needs Calories: 1800 Carbohydrate: 200g Protein: 135g Fat: 50g   NUTRITION DIAGNOSIS  NB-1.1 Food and nutrition-related knowledge deficit As related to Diabetes Type 2.  As evidenced by A1C 8%.   NUTRITION INTERVENTION  Nutrition education (E-1) on the following topics:  Nutrition and Diabetes education provided on My Plate, CHO counting, meal planning, portion sizes, timing of meals, avoiding snacks between meals unless having a low blood sugar, target ranges for A1C and blood sugars, signs/symptoms and treatment of hyper/hypoglycemia, monitoring blood sugars, taking medications as prescribed, benefits of exercising 30 minutes per day and prevention of complications of DM.  Lifestyle Medicine  - Whole Food, Plant Predominant Nutrition  is highly recommended: Eat Plenty of vegetables, Mushrooms, fruits, Legumes, Whole Grains, Nuts, seeds in lieu of processed meats, processed snacks/pastries red meat, poultry, eggs.    -It is better to avoid simple carbohydrates including: Cakes, Sweet Desserts, Ice Cream, Soda (diet and regular), Sweet Tea, Candies, Chips, Cookies, Store Bought Juices, Alcohol in Excess of  1-2 drinks a day, Lemonade,  Artificial Sweeteners, Doughnuts, Coffee Creamers, "Sugar-free" Products, etc, etc.  This is not a complete list.....  Exercise: If you are able: 30 -60 minutes a day ,4 days a week, or 150 minutes a week.  The longer the better.  Combine stretch, strength, and aerobic activities.  If you were told in the past that you have high risk for cardiovascular diseases, you may seek evaluation by your heart doctor prior to initiating moderate to intense exercise programs.   Handouts Provided Include  Lifestyle Medicine Diabetes booklet handouts  Learning Style & Readiness for Change Teaching method utilized: Visual & Auditory  Demonstrated degree of understanding via: Teach Back  Barriers to learning/adherence to lifestyle change: none  Goals Established by Pt Goals  Eat three meals per day at times per day Drink 3-4 bottles of water per day Test blood sugars twice a day Don't skip meals Focus on more whole plant based foods Cut out processed foods Get A1C to 7% or below.   MONITORING & EVALUATION Dietary intake, weekly physical activity, and blood sugars  in 1 month.  Next Steps  Patient is to work on meal planning and eating more consistent meals to improve blood sugars.Marland Kitchen

## 2022-04-15 NOTE — Patient Instructions (Addendum)
Goals  Eat three meals per day at times per day Drink 3-4 bottles of water per day Test blood sugars twice a day Don't skip meals Focus on more whole plant based foods Cut out processed foods Get A1C to 7% or below.

## 2022-04-16 ENCOUNTER — Other Ambulatory Visit: Payer: Self-pay

## 2022-04-16 NOTE — Progress Notes (Signed)
Error

## 2022-04-29 ENCOUNTER — Ambulatory Visit (INDEPENDENT_AMBULATORY_CARE_PROVIDER_SITE_OTHER): Payer: Medicare HMO | Admitting: Physician Assistant

## 2022-04-29 ENCOUNTER — Encounter: Payer: Self-pay | Admitting: Physician Assistant

## 2022-04-29 ENCOUNTER — Ambulatory Visit (INDEPENDENT_AMBULATORY_CARE_PROVIDER_SITE_OTHER)
Admission: RE | Admit: 2022-04-29 | Discharge: 2022-04-29 | Disposition: A | Discharge status: Home / Self Care | Payer: Medicare HMO | Source: Ambulatory Visit | Attending: Vascular Surgery | Admitting: Vascular Surgery

## 2022-04-29 ENCOUNTER — Ambulatory Visit (HOSPITAL_COMMUNITY)
Admission: RE | Admit: 2022-04-29 | Discharge: 2022-04-29 | Disposition: A | Discharge status: Home / Self Care | Payer: Medicare HMO | Source: Ambulatory Visit | Attending: Vascular Surgery | Admitting: Vascular Surgery

## 2022-04-29 VITALS — BP 152/84 | HR 70 | Temp 98.1°F | Resp 20 | Ht 70.5 in | Wt 225.0 lb

## 2022-04-29 DIAGNOSIS — I70222 Atherosclerosis of native arteries of extremities with rest pain, left leg: Secondary | ICD-10-CM

## 2022-04-29 DIAGNOSIS — M79673 Pain in unspecified foot: Secondary | ICD-10-CM | POA: Diagnosis not present

## 2022-04-29 LAB — VAS US ABI WITH/WO TBI: Left ABI: 0.95
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
## 2022-05-01 ENCOUNTER — Other Ambulatory Visit: Payer: Self-pay

## 2022-05-01 DIAGNOSIS — I70222 Atherosclerosis of native arteries of extremities with rest pain, left leg: Secondary | ICD-10-CM

## 2022-05-12 ENCOUNTER — Ambulatory Visit: Payer: Medicare HMO | Admitting: Nutrition

## 2022-05-26 ENCOUNTER — Ambulatory Visit: Payer: Medicare HMO

## 2022-05-26 ENCOUNTER — Ambulatory Visit: Payer: Medicare HMO | Admitting: Podiatry

## 2022-05-26 DIAGNOSIS — L97511 Non-pressure chronic ulcer of other part of right foot limited to breakdown of skin: Secondary | ICD-10-CM

## 2022-05-26 DIAGNOSIS — R52 Pain, unspecified: Secondary | ICD-10-CM

## 2022-05-26 DIAGNOSIS — L03115 Cellulitis of right lower limb: Secondary | ICD-10-CM | POA: Diagnosis not present

## 2022-05-26 DIAGNOSIS — E1142 Type 2 diabetes mellitus with diabetic polyneuropathy: Secondary | ICD-10-CM | POA: Diagnosis not present

## 2022-05-26 MED ORDER — CEPHALEXIN 500 MG PO CAPS: 500.0000 mg | ORAL_CAPSULE | Freq: Three times a day (TID) | ORAL | 0 refills | Status: DC

## 2022-05-26 NOTE — Patient Instructions (Signed)
For topical medication for neuropathy you can try Aspercreme with lidocaine or capsaicin cream

## 2022-05-26 NOTE — Progress Notes (Signed)
Subjective: Chief Complaint  Patient presents with   Foot Injury    Patient does not know how the foot was injured, midfoot   72 year old male presents For above concerns.  States he is not sure how he injured the foot but he has noticed 2 spots on the side of the foot and some mild redness.  He was kicking something he thinks it may have come from that.  No drainage or pus.  No treatment.  He also has secondary concerns of worsening neuropathy symptoms.  No ulcerations.  Objective: AAO x3, NAD DP/PT pulses palpable bilaterally, CRT less than 3 seconds Sensation decreased with Semmes Weinstein monofilament Dorsal aspect of right foot there are 2 small superficial scabbed areas with mild localized erythema without any drainage or pus.  There is no fluctuation or crepitation but there is no malodor.  No ascending cellulitis.  No pain with calf compression, swelling, warmth, erythema  Assessment: Right dorsal midfoot abrasions with localized erythema; neuropathy  Plan: -All treatment options discussed with the patient including all alternatives, risks, complications.  -X-rays obtained reviewed.  No evidence of acute fracture. No cortical changes to suggest osteomyelitis. -Recommend offloading.  Daily dressing changes with antibiotic ointment. Prescribed Keflex. -We discussed increasing dose of gabapentin or possibly switching to Lyrica.  Discussed over-the-counter creams. -Patient encouraged to call the office with any questions, concerns, change in symptoms.   Vivi Barrack DPM

## 2022-05-27 ENCOUNTER — Encounter (INDEPENDENT_AMBULATORY_CARE_PROVIDER_SITE_OTHER): Payer: Medicare HMO | Admitting: Ophthalmology

## 2022-05-27 DIAGNOSIS — H2511 Age-related nuclear cataract, right eye: Secondary | ICD-10-CM

## 2022-05-27 DIAGNOSIS — E113313 Type 2 diabetes mellitus with moderate nonproliferative diabetic retinopathy with macular edema, bilateral: Secondary | ICD-10-CM | POA: Diagnosis not present

## 2022-05-27 DIAGNOSIS — H43813 Vitreous degeneration, bilateral: Secondary | ICD-10-CM | POA: Diagnosis not present

## 2022-05-27 DIAGNOSIS — I10 Essential (primary) hypertension: Secondary | ICD-10-CM | POA: Diagnosis not present

## 2022-05-27 DIAGNOSIS — H35033 Hypertensive retinopathy, bilateral: Secondary | ICD-10-CM | POA: Diagnosis not present

## 2022-06-05 ENCOUNTER — Ambulatory Visit (INDEPENDENT_AMBULATORY_CARE_PROVIDER_SITE_OTHER): Payer: Medicare HMO | Admitting: Family Medicine

## 2022-06-05 ENCOUNTER — Encounter: Payer: Self-pay | Admitting: Family Medicine

## 2022-06-05 VITALS — BP 123/71 | HR 57 | Temp 97.0°F | Ht 70.5 in | Wt 219.8 lb

## 2022-06-05 DIAGNOSIS — E785 Hyperlipidemia, unspecified: Secondary | ICD-10-CM | POA: Diagnosis not present

## 2022-06-05 DIAGNOSIS — E1169 Type 2 diabetes mellitus with other specified complication: Secondary | ICD-10-CM | POA: Diagnosis not present

## 2022-06-05 DIAGNOSIS — I152 Hypertension secondary to endocrine disorders: Secondary | ICD-10-CM | POA: Diagnosis not present

## 2022-06-05 DIAGNOSIS — E1142 Type 2 diabetes mellitus with diabetic polyneuropathy: Secondary | ICD-10-CM | POA: Diagnosis not present

## 2022-06-05 DIAGNOSIS — E1159 Type 2 diabetes mellitus with other circulatory complications: Secondary | ICD-10-CM | POA: Diagnosis not present

## 2022-06-05 LAB — BAYER DCA HB A1C WAIVED: HB A1C (BAYER DCA - WAIVED): 7.4 % — ABNORMAL HIGH (ref 4.8–5.6)

## 2022-06-05 MED ORDER — GABAPENTIN 300 MG PO CAPS: 900.0000 mg | ORAL_CAPSULE | Freq: Three times a day (TID) | ORAL | 3 refills | Status: DC

## 2022-06-05 NOTE — Patient Instructions (Addendum)

## 2022-06-05 NOTE — Progress Notes (Signed)
Subjective:  Patient ID: [name], male    DOB: 10-Jan-1951, 72 y.o.   MRN: [id number]  Patient Care Team: Baruch Gouty, FNP as PCP - General (Family Medicine) Elayne Snare, MD as Consulting Physician (Endocrinology) Leta Baptist, MD as Consulting Physician (Otolaryngology) [facility] Hayden Pedro, MD as Consulting Physician (Ophthalmology) Tresa Garter, MD as Consulting Physician (Internal Medicine)   Chief Complaint:  Diabetes (3 month follow up )   HPI: [name] is a 72 y.o. male presenting on 06/05/2022 for Diabetes (3 month follow up )   1. Type 2 diabetes mellitus with other specified complication, without long-term current use of insulin Compliant with medications and has made significant dietary changes since last visit. Does have a wound to his foot that podiatry is currently treating. States it is healing well and redness has subsided. He denies polyuria, polyphagia, or polydipsia.   2. Diabetic polyneuropathy associated with type 2 diabetes mellitus Reports worsening numbness, tingling, and burning to bilateral lower legs. States he has an appointment with vascular surgery soon. He was on three times daily dosing of gabapentin in the past but is now only taking twice daily. States the three times daily dosing was beneficial.   3. Hypertension associated with type 2 diabetes mellitus Complaint with meds - Yes Current Medications - lisinopril Checking BP at home - no Exercising Regularly - No Watching Salt intake - Yes Pertinent ROS:  Headache - No Fatigue - No Visual Disturbances - No Chest pain - No Dyspnea - No Palpitations - No LE edema - No They report good compliance with medications and can restate their regimen by memory. No medication side effects.  BP Readings from Last 3 Encounters:  06/05/22 123/71  04/29/22 (!) 152/84  03/18/22 (!) 175/91     4. Hyperlipidemia associated with type 2 diabetes  mellitus Compliant with medications - Yes Current medications - Crestor Side effects from medications - No Diet - generally healthy Exercise - not regular but active        Relevant past medical, surgical, family, and social history reviewed and updated as indicated.  Allergies and medications reviewed and updated. Data reviewed: Chart in Epic.   Past Medical History:  Diagnosis Date   Allergy    seasonal   Arthritis    " all over me- back, hips, knees, hands, everywhere "   Cancer    Prostate cancer age 13; Lupron treatments followed by prostatectomy.   Cataract    left removed, forming right   Chronic constipation    x 23 yrs after prostate surgery   Diabetes mellitus without complication    Diabetic retinal damage of both eyes    gets shots every month   GERD (gastroesophageal reflux disease)    Hyperlipidemia    Hypertension    Myocardial infarction    age 64; no stenting.   OSA (obstructive sleep apnea)    non-compliant with CPAP.   Sleep apnea    no cpap    Past Surgical History:  Procedure Laterality Date   ABDOMINAL AORTOGRAM W/LOWER EXTREMITY N/A 01/17/2022   Procedure: ABDOMINAL AORTOGRAM W/LOWER EXTREMITY;  Surgeon: Angelia Mould, MD;  Location: [facility];  Service: Cardiovascular;  Laterality: N/A;   AMPUTATION TOE Left 02/04/2022   Procedure: AMPUTATION  SECOND TOE;  Surgeon: Trula Slade, DPM;  Location: [facility];  Service: Podiatry;  Laterality: Left;   APPENDECTOMY  CHOLECYSTECTOMY     FEMORAL-TIBIAL BYPASS GRAFT Left 01/29/2022   Procedure: LEFT COMMON FEMORAL BYPASS TO ANTERIOR TIBIAL ARTERY;  Surgeon: Marty Heck, MD;  Location: [facility];  Service: Vascular;  Laterality: Left;   PROSTATE SURGERY     VEIN HARVEST Left 01/29/2022   Procedure: HARVEST OF LEFT LEG GREAT SAPHENOUS VEIN;  Surgeon: Marty Heck, MD;  Location: [facility];  Service: Vascular;  Laterality: Left;    Social History   Socioeconomic  History   Marital status: Single    Spouse name: GF-Betty   Number of children: 2   Years of education: 11   Highest education level: Not on file  Occupational History   Occupation: supervisor    Employer: [employer]   Tobacco Use   Smoking status: Never    Passive exposure: Never   Smokeless tobacco: Former    Types: Nurse, children's Use: Never used  Substance and Sexual Activity   Alcohol use: Yes    Comment: social   Drug use: Not Currently   Sexual activity: Not Currently  Other Topics Concern   Not on file  Social History Narrative   Marital status: divorced, girlfriend x 1977     Children: 1      Tobacco: none; chew tobacco      Alcohol: beers on weekends      Exercise: none   Social Determinants of Health   Financial Resource Strain: Low Risk  (11/25/2020)   Overall Financial Resource Strain (CARDIA)    Difficulty of Paying Living Expenses: Not hard at all  Food Insecurity: Food Insecurity Present (02/06/2022)   Hunger Vital Sign    Worried About [place] in the Last Year: Sometimes true    Ran Out of Food in the Last Year: Sometimes true  Transportation Needs: No Transportation Needs (02/12/2022)   PRAPARE - Hydrologist (Medical): No    Lack of Transportation (Non-Medical): No  Physical Activity: Insufficiently Active (11/25/2020)   Exercise Vital Sign    Days of Exercise per Week: 7 days    Minutes of Exercise per Session: 10 min  Stress: Stress Concern Present (11/25/2020)   [place]    Feeling of Stress : To some extent  Social Connections: Moderately Isolated (11/25/2020)   Social Connection and Isolation Panel [NHANES]    Frequency of Communication with Friends and Family: More than three times a week    Frequency of Social Gatherings with Friends and Family: More than three times a week    Attends Religious Services: More than 4  times per year    Active Member of [employer] or Organizations: No    Attends Archivist Meetings: Never    Marital Status: Divorced  Human resources officer Violence: Not At Risk (02/06/2022)   Humiliation, Afraid, Rape, and Kick questionnaire    Fear of Current or Ex-Partner: No    Emotionally Abused: No    Physically Abused: No    Sexually Abused: No    Outpatient Encounter Medications as of 06/05/2022  Medication Sig   aspirin 81 MG tablet Take 81 mg by mouth daily.   BAYER CONTOUR NEXT TEST test strip CHECK BLOOD SUGAR TWICE DAILY   BAYER MICROLET LANCETS lancets Check blood sugar two times daily.   Blood Glucose Monitoring Suppl (BAYER CONTOUR NEXT MONITOR) w/Device KIT Check blood sugar two times daily.   cephALEXin (  KEFLEX) 500 MG capsule Take 1 capsule (500 mg total) by mouth 3 (three) times daily.   ciprofloxacin (CILOXAN) 0.3 % ophthalmic solution Place 1 drop into both eyes See admin instructions. NSTILL ONE DROP INTO BOTH EYES 4 TIMES A DAY FOR TWO DAYS AFTER EACH MONTHLY EYE INJECTION   fluticasone (FLONASE) 50 MCG/ACT nasal spray Place 1 spray into both nostrils daily as needed for allergies or rhinitis.   lisinopril (ZESTRIL) 5 MG tablet Take 1 tablet (5 mg total) by mouth daily.   metFORMIN (GLUCOPHAGE-XR) 500 MG 24 hr tablet Take 2 tablets (1,000 mg total) by mouth daily with breakfast AND 2 tablets (1,000 mg total) every evening. 4 tablets daily.   pantoprazole (PROTONIX) 40 MG tablet TAKE 1 TABLET (40 MG TOTAL) BY MOUTH TWICE A DAY BEFORE MEALS   rosuvastatin (CRESTOR) 20 MG tablet Take 1 tablet (20 mg total) by mouth daily.   gabapentin (NEURONTIN) 300 MG capsule Take 3 capsules (900 mg total) by mouth 3 (three) times daily.   [DISCONTINUED] gabapentin (NEURONTIN) 300 MG capsule Take 3 capsules (900 mg total) by mouth 2 (two) times daily.   No facility-administered encounter medications on file as of 06/05/2022.    Allergies  Allergen Reactions   Cymbalta [Duloxetine  Hcl] Nausea And Vomiting   Darvocet [Propoxyphene N-Acetaminophen] Hives    Review of Systems  Constitutional:  Negative for activity change, appetite change, chills, diaphoresis, fatigue, fever and unexpected weight change.  HENT: Negative.    Eyes: Negative.  Negative for photophobia and visual disturbance.  Respiratory:  Negative for cough, chest tightness and shortness of breath.   Cardiovascular:  Negative for chest pain, palpitations and leg swelling.  Gastrointestinal:  Negative for abdominal pain, blood in stool, constipation, diarrhea, nausea and vomiting.  Endocrine: Negative.  Negative for polydipsia, polyphagia and polyuria.  Genitourinary:  Negative for decreased urine volume, difficulty urinating, dysuria, frequency and urgency.  Musculoskeletal:  Positive for arthralgias and myalgias.  Skin:  Positive for wound (healing wound to foot - followed by podiatry).  Allergic/Immunologic: Negative.   Neurological:  Positive for numbness (bilateral lower legs). Negative for dizziness, tremors, seizures, syncope, facial asymmetry, speech difficulty, weakness, light-headedness and headaches.  Hematological: Negative.   Psychiatric/Behavioral:  Negative for confusion, hallucinations, sleep disturbance and suicidal ideas.   All other systems reviewed and are negative.       Objective:  BP 123/71   Pulse (!) 57   Temp (!) 97 F (36.1 C) (Temporal)   Ht 5' 10.5" (1.791 m)   Wt 219 lb 12.8 oz (99.7 kg)   SpO2 97%   BMI 31.09 kg/m    Wt Readings from Last 3 Encounters:  06/05/22 219 lb 12.8 oz (99.7 kg)  04/29/22 225 lb (102.1 kg)  04/15/22 220 lb (99.8 kg)    Physical Exam Vitals and nursing note reviewed.  Constitutional:      General: He is not in acute distress.    Appearance: Normal appearance. He is well-developed and well-groomed. He is obese. He is not ill-appearing, toxic-appearing or diaphoretic.  HENT:     Head: Normocephalic and atraumatic.     Jaw: There is  normal jaw occlusion.     Right Ear: Hearing normal.     Left Ear: Hearing normal.     Nose: Nose normal.     Mouth/Throat:     Lips: Pink.     Mouth: Mucous membranes are moist.     Pharynx: Uvula midline.  Eyes:  General: Lids are normal.     Conjunctiva/sclera: Conjunctivae normal.     Pupils: Pupils are equal, round, and reactive to light.  Neck:     Thyroid: No thyroid mass, thyromegaly or thyroid tenderness.     Vascular: No carotid bruit or JVD.     Trachea: Trachea and phonation normal.  Cardiovascular:     Rate and Rhythm: Normal rate and regular rhythm.     Chest Wall: PMI is not displaced.     Pulses: Normal pulses.     Heart sounds: Normal heart sounds. No murmur heard.    No friction rub. No gallop.  Pulmonary:     Effort: Pulmonary effort is normal. No respiratory distress.     Breath sounds: Normal breath sounds. No wheezing.  Abdominal:     General: Bowel sounds are normal. There is no distension or abdominal bruit.     Palpations: Abdomen is soft. There is no hepatomegaly or splenomegaly.     Tenderness: There is no abdominal tenderness. There is no right CVA tenderness or left CVA tenderness.     Hernia: No hernia is present.  Musculoskeletal:        General: Normal range of motion.     Cervical back: Normal range of motion and neck supple.     Right lower leg: No edema.     Left lower leg: No edema.  Lymphadenopathy:     Cervical: No cervical adenopathy.  Skin:    General: Skin is warm and dry.     Capillary Refill: Capillary refill takes less than 2 seconds.     Coloration: Skin is not cyanotic, jaundiced or pale.     Findings: No rash.  Neurological:     General: No focal deficit present.     Mental Status: He is alert and oriented to person, place, and time.     Sensory: Sensation is intact.     Motor: Motor function is intact.     Coordination: Coordination is intact.     Gait: Gait is intact.     Deep Tendon Reflexes: Reflexes are normal and  symmetric.  Psychiatric:        Attention and Perception: Attention and perception normal.        Mood and Affect: Mood and affect normal.        Speech: Speech normal.        Behavior: Behavior normal. Behavior is cooperative.        Thought Content: Thought content normal.        Cognition and Memory: Cognition and memory normal.        Judgment: Judgment normal.     Results for orders placed or performed during the hospital encounter of 04/29/22  VAS [place] ABI WITH/WO TBI  Result Value Ref Range   Right ABI Noncompressible    Left ABI 0.95        Pertinent labs & imaging results that were available during my care of the patient were reviewed by me and considered in my medical decision making.  Assessment & Plan:  Deryn was seen today for diabetes.  Diagnoses and all orders for this visit:  Type 2 diabetes mellitus with other specified complication, without long-term current use of insulin A1C has improved to 7.4 from 8 with dietary changes. Pt aware to continue dietary changes. If A1C remains above 7 at next visit, will make medications adjustments.  -     Bayer DCA Hb A1c Waived  Diabetic polyneuropathy  associated with type 2 diabetes mellitus Worsening symptoms. Will increase gabapentin to three times daily dosing as he was on this in the past and it was beneficial. Has follow up with vascular surgery soon, aware to discuss this with them also. Pulses normal in office.  -     gabapentin (NEURONTIN) 300 MG capsule; Take 3 capsules (900 mg total) by mouth 3 (three) times daily.  Hypertension associated with type 2 diabetes mellitus BP well controlled. Changes were not made in regimen today. Goal BP is 130/80. Pt aware to report any persistent high or low readings. DASH diet and exercise encouraged. Exercise at least 150 minutes per week and increase as tolerated. Goal BMI > 25. Stress management encouraged. Avoid nicotine and tobacco product use. Avoid excessive alcohol and  NSAID's. Avoid more than 2000 mg of sodium daily. Medications as prescribed. Follow up as scheduled.   Hyperlipidemia associated with type 2 diabetes mellitus Diet encouraged - increase intake of fresh fruits and vegetables, increase intake of lean proteins. Bake, broil, or grill foods. Avoid fried, greasy, and fatty foods. Avoid fast foods. Increase intake of fiber-rich whole grains. Exercise encouraged - at least 150 minutes per week and advance as tolerated.  Goal BMI < 25. Continue medications as prescribed. Follow up in 3-6 months as discussed.      Continue all other maintenance medications.  Follow up plan: Return in about 3 months (around 09/04/2022), or if symptoms worsen or fail to improve, for CPE.   Continue healthy lifestyle choices, including diet (rich in fruits, vegetables, and lean proteins, and low in salt and simple carbohydrates) and exercise (at least 30 minutes of moderate physical activity daily).  Educational handout given for DM  The above assessment and management plan was discussed with the patient. The patient verbalized understanding of and has agreed to the management plan. Patient is aware to call the clinic if they develop any new symptoms or if symptoms persist or worsen. Patient is aware when to return to the clinic for a follow-up visit. Patient educated on when it is appropriate to go to the emergency department.   Monia Pouch, FNP-C [facility] [phone]

## 2022-06-10 ENCOUNTER — Ambulatory Visit: Payer: Medicare HMO | Admitting: Podiatry

## 2022-06-10 DIAGNOSIS — M79675 Pain in left toe(s): Secondary | ICD-10-CM

## 2022-06-10 DIAGNOSIS — M79674 Pain in right toe(s): Secondary | ICD-10-CM

## 2022-06-10 DIAGNOSIS — B351 Tinea unguium: Secondary | ICD-10-CM

## 2022-06-10 DIAGNOSIS — L97511 Non-pressure chronic ulcer of other part of right foot limited to breakdown of skin: Secondary | ICD-10-CM | POA: Diagnosis not present

## 2022-06-11 NOTE — Progress Notes (Signed)
Subjective: Chief Complaint  Patient presents with   Foot Ulcer    72 year old male presents for above concerns.  He presents today for follow-up evaluation.  He states that he is doing better.  No drainage or pus that he reports.  No open lesions.   Nails are also thickened elongated is not able to trim them himself.  No swelling redness or drainage the toenail sites.  Amputation site of the left second toe is well-healed.   Objective: AAO x3, NAD DP/PT pulses palpable bilaterally, CRT less than 3 seconds Sensation decreased with Semmes Weinstein monofilament Dorsal aspect of right foot there are 2 small superficial scabbed areas.  1 came off and there is still 1 superficial scab, callus over many.  There is some faint rim of erythema likely more from inflammation as opposed to infection.  No drainage or pus.  No ascending cellulitis.  No significant edema. Nails are hypertrophic, dystrophic, brittle, discolored, elongated 10. No surrounding redness or drainage. Tenderness nails 1-5 bilaterally except for left second toe which has been amputated. No open lesions or pre-ulcerative lesions are identified today. No pain with calf compression, swelling, warmth, erythema  Assessment: Right dorsal midfoot abrasions with localized erythema; neuropathy  Plan: -All treatment options discussed with the patient including all alternatives, risks, complications.  -Debridement of the small areas of scab which came off and new underlying skin intact.  Mild inflammation to the business significant cellulitis.  Continue antibiotic ointment dressing changes daily.  Monitor closely for any signs or symptoms of infection immediately should any occur. -Separate debrided nails x 9 without any complications or bleeding. -Patient encouraged to call the office with any questions, concerns, change in symptoms.   Vivi Barrack DPM

## 2022-06-25 ENCOUNTER — Encounter (INDEPENDENT_AMBULATORY_CARE_PROVIDER_SITE_OTHER): Payer: Medicare HMO | Admitting: Ophthalmology

## 2022-06-25 DIAGNOSIS — E113313 Type 2 diabetes mellitus with moderate nonproliferative diabetic retinopathy with macular edema, bilateral: Secondary | ICD-10-CM | POA: Diagnosis not present

## 2022-06-25 DIAGNOSIS — H43813 Vitreous degeneration, bilateral: Secondary | ICD-10-CM | POA: Diagnosis not present

## 2022-06-25 DIAGNOSIS — H35033 Hypertensive retinopathy, bilateral: Secondary | ICD-10-CM

## 2022-06-25 DIAGNOSIS — Z7984 Long term (current) use of oral hypoglycemic drugs: Secondary | ICD-10-CM

## 2022-06-25 DIAGNOSIS — I10 Essential (primary) hypertension: Secondary | ICD-10-CM | POA: Diagnosis not present

## 2022-07-23 ENCOUNTER — Encounter (INDEPENDENT_AMBULATORY_CARE_PROVIDER_SITE_OTHER): Payer: Medicare HMO | Admitting: Ophthalmology

## 2022-07-23 DIAGNOSIS — H35033 Hypertensive retinopathy, bilateral: Secondary | ICD-10-CM

## 2022-07-23 DIAGNOSIS — H43813 Vitreous degeneration, bilateral: Secondary | ICD-10-CM | POA: Diagnosis not present

## 2022-07-23 DIAGNOSIS — E113313 Type 2 diabetes mellitus with moderate nonproliferative diabetic retinopathy with macular edema, bilateral: Secondary | ICD-10-CM

## 2022-07-23 DIAGNOSIS — Z7984 Long term (current) use of oral hypoglycemic drugs: Secondary | ICD-10-CM

## 2022-07-23 DIAGNOSIS — I10 Essential (primary) hypertension: Secondary | ICD-10-CM | POA: Diagnosis not present

## 2022-08-20 ENCOUNTER — Encounter (INDEPENDENT_AMBULATORY_CARE_PROVIDER_SITE_OTHER): Payer: Medicare HMO | Admitting: Ophthalmology

## 2022-09-09 ENCOUNTER — Ambulatory Visit: Payer: Medicare HMO | Admitting: Podiatry

## 2022-09-22 ENCOUNTER — Encounter (INDEPENDENT_AMBULATORY_CARE_PROVIDER_SITE_OTHER): Payer: Medicare HMO | Admitting: Ophthalmology

## 2022-09-22 DIAGNOSIS — Z7984 Long term (current) use of oral hypoglycemic drugs: Secondary | ICD-10-CM | POA: Diagnosis not present

## 2022-09-22 DIAGNOSIS — E113313 Type 2 diabetes mellitus with moderate nonproliferative diabetic retinopathy with macular edema, bilateral: Secondary | ICD-10-CM | POA: Diagnosis not present

## 2022-09-22 DIAGNOSIS — I10 Essential (primary) hypertension: Secondary | ICD-10-CM | POA: Diagnosis not present

## 2022-09-22 DIAGNOSIS — H35033 Hypertensive retinopathy, bilateral: Secondary | ICD-10-CM | POA: Diagnosis not present

## 2022-09-22 DIAGNOSIS — H43813 Vitreous degeneration, bilateral: Secondary | ICD-10-CM

## 2022-10-07 ENCOUNTER — Encounter: Payer: Self-pay | Admitting: Family Medicine

## 2022-10-07 ENCOUNTER — Ambulatory Visit (INDEPENDENT_AMBULATORY_CARE_PROVIDER_SITE_OTHER): Payer: Medicare HMO | Admitting: Family Medicine

## 2022-10-07 VITALS — BP 120/72 | HR 71 | Temp 97.5°F | Ht 70.5 in | Wt 219.2 lb

## 2022-10-07 DIAGNOSIS — I25119 Atherosclerotic heart disease of native coronary artery with unspecified angina pectoris: Secondary | ICD-10-CM

## 2022-10-07 DIAGNOSIS — Z125 Encounter for screening for malignant neoplasm of prostate: Secondary | ICD-10-CM | POA: Diagnosis not present

## 2022-10-07 DIAGNOSIS — E785 Hyperlipidemia, unspecified: Secondary | ICD-10-CM

## 2022-10-07 DIAGNOSIS — E1159 Type 2 diabetes mellitus with other circulatory complications: Secondary | ICD-10-CM | POA: Diagnosis not present

## 2022-10-07 DIAGNOSIS — Z0001 Encounter for general adult medical examination with abnormal findings: Secondary | ICD-10-CM | POA: Diagnosis not present

## 2022-10-07 DIAGNOSIS — Z23 Encounter for immunization: Secondary | ICD-10-CM

## 2022-10-07 DIAGNOSIS — E1142 Type 2 diabetes mellitus with diabetic polyneuropathy: Secondary | ICD-10-CM

## 2022-10-07 DIAGNOSIS — Z0000 Encounter for general adult medical examination without abnormal findings: Secondary | ICD-10-CM

## 2022-10-07 DIAGNOSIS — E1169 Type 2 diabetes mellitus with other specified complication: Secondary | ICD-10-CM | POA: Diagnosis not present

## 2022-10-07 DIAGNOSIS — I152 Hypertension secondary to endocrine disorders: Secondary | ICD-10-CM

## 2022-10-07 LAB — CMP14+EGFR

## 2022-10-07 LAB — CBC WITH DIFFERENTIAL/PLATELET
Basophils Absolute: 0.1 10*3/uL (ref 0.0–0.2)
Basos: 1 %
EOS (ABSOLUTE): 0.2 10*3/uL (ref 0.0–0.4)
Eos: 2 %
Hematocrit: 38.5 % (ref 37.5–51.0)
Hemoglobin: 12.7 g/dL — ABNORMAL LOW (ref 13.0–17.7)
Immature Grans (Abs): 0 10*3/uL (ref 0.0–0.1)
Immature Granulocytes: 0 %
Lymphocytes Absolute: 3.3 10*3/uL — ABNORMAL HIGH (ref 0.7–3.1)
Lymphs: 38 %
MCH: 28.9 pg (ref 26.6–33.0)
MCHC: 33 g/dL (ref 31.5–35.7)
MCV: 88 fL (ref 79–97)
Monocytes Absolute: 0.7 10*3/uL (ref 0.1–0.9)
Monocytes: 8 %
Neutrophils Absolute: 4.4 10*3/uL (ref 1.4–7.0)
Neutrophils: 51 %
Platelets: 191 10*3/uL (ref 150–450)
RBC: 4.39 x10E6/uL (ref 4.14–5.80)
RDW: 13.1 % (ref 11.6–15.4)
WBC: 8.6 10*3/uL (ref 3.4–10.8)

## 2022-10-07 LAB — LIPID PANEL

## 2022-10-07 LAB — VITAMIN B12

## 2022-10-07 LAB — BAYER DCA HB A1C WAIVED: HB A1C (BAYER DCA - WAIVED): 7.2 % — ABNORMAL HIGH (ref 4.8–5.6)

## 2022-10-07 MED ORDER — METFORMIN HCL ER 500 MG PO TB24: ORAL_TABLET | ORAL | 1 refills | Status: DC

## 2022-10-07 MED ORDER — DAPAGLIFLOZIN PROPANEDIOL 5 MG PO TABS: 5.0000 mg | ORAL_TABLET | Freq: Every day | ORAL | 6 refills | Status: DC

## 2022-10-07 MED ORDER — GABAPENTIN 300 MG PO CAPS: 900.0000 mg | ORAL_CAPSULE | Freq: Three times a day (TID) | ORAL | 0 refills | Status: DC

## 2022-10-07 NOTE — Progress Notes (Signed)
Complete physical exam  Patient: [name]   DOB: [birth year]   72 y.o. Male  MRN: [id number]  Subjective:    Chief Complaint  Patient presents with   Annual Exam    [name] is a 72 y.o. male who presents today for a complete physical exam. He reports consuming a general diet. The patient does not participate in regular exercise at present. He generally feels fairly well. He reports sleeping well. He does have additional problems to discuss today.   States he feels his sleep apnea has worsened. Has been seen for this in the past, advised to follow up with pulmonology and ENT.  Continues to have neuropathy in bilateral lower extremities, left worse than right. States gabapentin is minimally beneficial. Symptoms are worse at night. He does have a follow up with vascular surgery scheduled soon.  He denies any anginal symptoms. States blood pressure is well controlled. No headaches, chest pain, shortness or breath, or leg swelling.   Most recent fall risk assessment:    10/07/2022    8:15 AM  Fall Risk   Falls in the past year? 0     Most recent depression screenings:    10/07/2022    8:15 AM 06/05/2022    9:09 AM  PHQ 2/9 Scores  PHQ - 2 Score 0 1  PHQ- 9 Score 4 8    Vision:Within last year and Dental: No current dental problems and Receives regular dental care  Patient Active Problem List   Diagnosis Date Noted   Diabetic polyneuropathy associated with type 2 diabetes mellitus (HCC) 03/05/2022   Hyperlipidemia associated with type 2 diabetes mellitus (HCC) 03/05/2022   S/P amputation of lesser toe, left (HCC) 03/05/2022   Coronary artery disease involving native coronary artery of native heart with angina pectoris (HCC) 03/05/2022   Status post peripheral artery bypass 01/29/2022   PAD (peripheral artery disease) (HCC) 01/29/2022   Hypertension associated with type 2 diabetes mellitus (HCC) 12/06/2021   Alcohol abuse 12/18/2014   Bifascicular block  07/18/2014   OSA (obstructive sleep apnea) 05/21/2014   DM (diabetes mellitus) (HCC) 04/27/2012   Past Medical History:  Diagnosis Date   Allergy    seasonal   Arthritis    " all over me- back, hips, knees, hands, everywhere "   Cancer [facility])    Prostate cancer age 67; Lupron treatments followed by prostatectomy.   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   OSA (obstructive sleep apnea)    non-compliant with CPAP.   Sleep apnea    no cpap   Past Surgical History:  Procedure Laterality Date   ABDOMINAL AORTOGRAM W/LOWER EXTREMITY N/A 01/17/2022   Procedure: ABDOMINAL AORTOGRAM W/LOWER EXTREMITY;  Surgeon: Chuck Hint, MD;  Location: [facility] INVASIVE CV LAB;  Service: Cardiovascular;  Laterality: N/A;   AMPUTATION TOE Left 02/04/2022   Procedure: AMPUTATION  SECOND TOE;  Surgeon: Vivi Barrack, DPM;  Location: [facility] OR;  Service: Podiatry;  Laterality: Left;   APPENDECTOMY     CHOLECYSTECTOMY     FEMORAL-TIBIAL BYPASS GRAFT Left 01/29/2022  Procedure: LEFT COMMON FEMORAL BYPASS TO ANTERIOR TIBIAL ARTERY;  Surgeon: Cephus Shelling, MD;  Location: [facility] OR;  Service: Vascular;  Laterality: Left;   PROSTATE SURGERY     VEIN HARVEST Left 01/29/2022   Procedure: HARVEST OF LEFT LEG GREAT SAPHENOUS VEIN;  Surgeon: Cephus Shelling, MD;  Location: [facility] OR;  Service: Vascular;  Laterality: Left;   Social History   Tobacco Use   Smoking status: Never    Passive exposure: Never   Smokeless tobacco: Former    Types: Designer, multimedia Use   Vaping status: Never Used  Substance Use Topics   Alcohol use: Yes    Comment: social   Drug use: Not Currently   Social History    Socioeconomic History   Marital status: Single    Spouse name: GF-Betty   Number of children: 2   Years of education: 11   Highest education level: Not on file  Occupational History   Occupation: Event organiser: [employer]   Tobacco Use   Smoking status: Never    Passive exposure: Never   Smokeless tobacco: Former    Types: Engineer, drilling   Vaping status: Never Used  Substance and Sexual Activity   Alcohol use: Yes    Comment: social   Drug use: Not Currently   Sexual activity: Not Currently  Other Topics Concern   Not on file  Social History Narrative   Marital status: divorced, girlfriend x 1977     Children: 1      Tobacco: none; chew tobacco      Alcohol: beers on weekends      Exercise: none   Social Determinants of Health   Financial Resource Strain: Low Risk  (11/25/2020)   Overall Financial Resource Strain (CARDIA)    Difficulty of Paying Living Expenses: Not hard at all  Food Insecurity: Food Insecurity Present (02/06/2022)   Hunger Vital Sign    Worried About Running Out of Food in the Last Year: Sometimes true    Ran Out of Food in the Last Year: Sometimes true  Transportation Needs: No Transportation Needs (02/12/2022)   PRAPARE - Administrator, Civil Service (Medical): No    Lack of Transportation (Non-Medical): No  Physical Activity: Insufficiently Active (11/25/2020)   Exercise Vital Sign    Days of Exercise per Week: 7 days    Minutes of Exercise per Session: 10 min  Stress: Stress Concern Present (11/25/2020)   [employer] of Occupational Health - Occupational Stress Questionnaire    Feeling of Stress : To some extent  Social Connections: Moderately Isolated (11/25/2020)   Social Connection and Isolation Panel [NHANES]    Frequency of Communication with Friends and Family: More than three times a week    Frequency of Social Gatherings with Friends and Family: More than three times a week    Attends Religious  Services: More than 4 times per year    Active Member of [employer] or Organizations: No    Attends Banker Meetings: Never    Marital Status: Divorced  Catering manager Violence: Not At Risk (02/06/2022)   Humiliation, Afraid, Rape, and Kick questionnaire    Fear of Current or Ex-Partner: No    Emotionally Abused: No    Physically Abused: No    Sexually Abused: No   Family Status  Relation Name Status   Mother  Deceased at age 39       leukemia  Father  Deceased at age 19       lung cancer with mets   Sister  Deceased at age 36       GSW   Sister  Alive   Sister  Alive   Brother  Deceased at age 36       AMI   MGM  Deceased   MGF  Deceased   PGM  Deceased   PGF  Deceased   Neg Hx  (Not Specified)  No partnership data on file   Family History  Problem Relation Age of Onset   Cancer Mother        leukemia   Diabetes Mother    Cancer Father 13       lung cancer with mets   Colon cancer Maternal Grandmother    Heart disease Maternal Grandmother    Heart disease Maternal Grandfather    Heart disease Paternal Grandmother    Cancer Paternal Grandfather        lung, prostate   Esophageal cancer Neg Hx    Colon polyps Neg Hx    Stomach cancer Neg Hx    Rectal cancer Neg Hx    Allergies  Allergen Reactions   Cymbalta [Duloxetine Hcl] Nausea And Vomiting   Darvocet [Propoxyphene N-Acetaminophen] Hives      Patient Care Team: Sonny Masters, FNP as PCP - General (Family Medicine) Reather Littler, MD as Consulting Physician (Endocrinology) Newman Pies, MD as Consulting Physician (Otolaryngology) [facility] Sherrie George, MD as Consulting Physician (Ophthalmology) Quentin Angst, MD as Consulting Physician (Internal Medicine)   Outpatient Medications Prior to Visit  Medication Sig   aspirin 81 MG tablet Take 81 mg by mouth daily.   BAYER CONTOUR NEXT TEST test strip CHECK BLOOD SUGAR TWICE DAILY   BAYER MICROLET LANCETS lancets Check  blood sugar two times daily.   Blood Glucose Monitoring Suppl (BAYER CONTOUR NEXT MONITOR) w/Device KIT Check blood sugar two times daily.   ciprofloxacin (CILOXAN) 0.3 % ophthalmic solution Place 1 drop into both eyes See admin instructions. NSTILL ONE DROP INTO BOTH EYES 4 TIMES A DAY FOR TWO DAYS AFTER EACH MONTHLY EYE INJECTION   fluticasone (FLONASE) 50 MCG/ACT nasal spray Place 1 spray into both nostrils daily as needed for allergies or rhinitis.   lisinopril (ZESTRIL) 5 MG tablet Take 1 tablet (5 mg total) by mouth daily.   pantoprazole (PROTONIX) 40 MG tablet TAKE 1 TABLET (40 MG TOTAL) BY MOUTH TWICE A DAY BEFORE MEALS   rosuvastatin (CRESTOR) 20 MG tablet Take 1 tablet (20 mg total) by mouth daily.   [DISCONTINUED] cephALEXin (KEFLEX) 500 MG capsule Take 1 capsule (500 mg total) by mouth 3 (three) times daily.   [DISCONTINUED] gabapentin (NEURONTIN) 300 MG capsule Take 3 capsules (900 mg total) by mouth 3 (three) times daily.   [DISCONTINUED] metFORMIN (GLUCOPHAGE-XR) 500 MG 24 hr tablet Take 2 tablets (1,000 mg total) by mouth daily with breakfast AND 2 tablets (1,000 mg total) every evening. 4 tablets daily.   No facility-administered medications prior to visit.    Review of Systems  Respiratory:         Sleep apnea  Gastrointestinal:        GERD  Neurological:  Positive for tingling (numbness in bilateral lower extremities).  All other systems reviewed and are negative.         Objective:     BP 120/72   Pulse 71   Temp (!) 97.5 F (  36.4 C) (Temporal)   Ht 5' 10.5" (1.791 m)   Wt 219 lb 3.2 oz (99.4 kg)   SpO2 93%   BMI 31.01 kg/m  BP Readings from Last 3 Encounters:  10/07/22 120/72  06/05/22 123/71  04/29/22 (!) 152/84   Wt Readings from Last 3 Encounters:  10/07/22 219 lb 3.2 oz (99.4 kg)  06/05/22 219 lb 12.8 oz (99.7 kg)  04/29/22 225 lb (102.1 kg)   SpO2 Readings from Last 3 Encounters:  10/07/22 93%  06/05/22 97%  04/29/22 97%      Physical  Exam Vitals and nursing note reviewed.  Constitutional:      General: He is not in acute distress.    Appearance: Normal appearance. He is well-developed and well-groomed. He is not ill-appearing, toxic-appearing or diaphoretic.  HENT:     Head: Normocephalic and atraumatic.     Jaw: There is normal jaw occlusion.     Right Ear: Hearing, tympanic membrane, ear canal and external ear normal.     Left Ear: Hearing, tympanic membrane, ear canal and external ear normal.     Nose: Nose normal.     Mouth/Throat:     Lips: Pink.     Mouth: Mucous membranes are moist.     Pharynx: Oropharynx is clear. Uvula midline.  Eyes:     General: Lids are normal.     Extraocular Movements: Extraocular movements intact.     Conjunctiva/sclera: Conjunctivae normal.     Pupils: Pupils are equal, round, and reactive to light.  Neck:     Thyroid: No thyroid mass, thyromegaly or thyroid tenderness.     Vascular: No carotid bruit or JVD.     Trachea: Trachea and phonation normal.  Cardiovascular:     Rate and Rhythm: Normal rate and regular rhythm.     Chest Wall: PMI is not displaced.     Pulses: Normal pulses.     Heart sounds: Normal heart sounds. No murmur heard.    No friction rub. No gallop.  Pulmonary:     Effort: Pulmonary effort is normal. No respiratory distress.     Breath sounds: Normal breath sounds. No wheezing.  Abdominal:     General: Bowel sounds are normal. There is no distension or abdominal bruit.     Palpations: Abdomen is soft. There is no hepatomegaly or splenomegaly.     Tenderness: There is no abdominal tenderness. There is no right CVA tenderness or left CVA tenderness.     Hernia: No hernia is present.  Musculoskeletal:     Cervical back: Normal range of motion and neck supple.     Right lower leg: No edema.     Left lower leg: No edema.  Lymphadenopathy:     Cervical: No cervical adenopathy.  Skin:    General: Skin is warm and dry.     Capillary Refill: Capillary  refill takes less than 2 seconds.     Coloration: Skin is not cyanotic, jaundiced or pale.     Findings: No rash.     Comments: Healed surgical scar to LLE  Neurological:     General: No focal deficit present.     Mental Status: He is alert and oriented to person, place, and time.     Sensory: Sensation is intact.     Motor: Motor function is intact.     Coordination: Coordination is intact.     Gait: Gait is intact.     Deep Tendon Reflexes: Reflexes are normal  and symmetric.  Psychiatric:        Attention and Perception: Attention and perception normal.        Mood and Affect: Mood and affect normal.        Speech: Speech normal.        Behavior: Behavior normal. Behavior is cooperative.        Thought Content: Thought content normal.        Cognition and Memory: Cognition and memory normal.        Judgment: Judgment normal.      Last CBC Lab Results  Component Value Date   WBC 7.3 03/05/2022   HGB 13.3 03/05/2022   HCT 40.0 03/05/2022   MCV 88 03/05/2022   MCH 29.1 03/05/2022   RDW 12.5 03/05/2022   PLT 156 03/05/2022   Last metabolic panel Lab Results  Component Value Date   GLUCOSE 177 (H) 03/05/2022   NA 142 03/05/2022   K 4.3 03/05/2022   CL 103 03/05/2022   CO2 21 03/05/2022   BUN 13 03/05/2022   CREATININE 0.80 03/05/2022   EGFR 96 03/05/2022   CALCIUM 9.3 03/05/2022   PHOS 3.0 05/21/2014   PROT 6.7 03/05/2022   ALBUMIN 4.4 03/05/2022   LABGLOB 2.3 03/05/2022   AGRATIO 1.9 03/05/2022   BILITOT 0.7 03/05/2022   ALKPHOS 113 03/05/2022   AST 19 03/05/2022   ALT 15 03/05/2022   ANIONGAP 11 02/09/2022   Last lipids Lab Results  Component Value Date   CHOL 150 03/05/2022   HDL 51 03/05/2022   LDLCALC 80 03/05/2022   LDLDIRECT 112.0 08/03/2018   TRIG 101 03/05/2022   CHOLHDL 2.9 03/05/2022   Last hemoglobin A1c Lab Results  Component Value Date   HGBA1C 7.4 (H) 06/05/2022   Last thyroid functions Lab Results  Component Value Date   TSH  2.890 03/05/2022   T4TOTAL 8.9 03/05/2022   Last vitamin B12 and Folate Lab Results  Component Value Date   VITAMINB12 342 05/21/2014   FOLATE 19.2 05/21/2014        Assessment & Plan:    Routine Health Maintenance and Physical Exam  Immunization History  Administered Date(s) Administered   Influenza Split 11/20/2020   Influenza, High Dose Seasonal PF 11/24/2019   Influenza,inj,Quad PF,6+ Mos 11/04/2016, 12/27/2017, 11/09/2018   Influenza-Unspecified 11/16/2015, 12/27/2017, 12/27/2017, 12/12/2020, 12/04/2021   Moderna Sars-Covid-2 Vaccination 05/13/2019, 06/15/2019, 02/23/2020, 12/20/2020   PNEUMOCOCCAL CONJUGATE-20 10/07/2022   Pneumococcal Polysaccharide-23 08/04/2015, 11/09/2018   Td 03/07/1995   Td (Adult),5 Lf Tetanus Toxid, Preservative Free 03/07/1995   Tdap 09/26/2012, 07/14/2017   Varicella 12/04/2021   Zoster Recombinant(Shingrix) 12/04/2021    Health Maintenance  Topic Date Due   Medicare Annual Wellness (AWV)  11/29/2022   COVID-19 Vaccine (5 - 2023-24 season) 10/23/2022 (Originally 11/01/2021)   Zoster Vaccines- Shingrix (2 of 2) 01/07/2023 (Originally 01/29/2022)   INFLUENZA VACCINE  06/01/2023 (Originally 10/02/2022)   OPHTHALMOLOGY EXAM  11/22/2022   FOOT EXAM  11/23/2022   HEMOGLOBIN A1C  12/05/2022   Diabetic kidney evaluation - eGFR measurement  03/06/2023   Diabetic kidney evaluation - Urine ACR  03/06/2023   Colonoscopy  07/24/2024   DTaP/Tdap/Td (5 - Td or Tdap) 07/15/2027   Pneumonia Vaccine 37+ Years old  Completed   Hepatitis C Screening  Completed   HPV VACCINES  Aged Out    Discussed health benefits of physical activity, and encouraged him to engage in regular exercise appropriate for his age and condition.  Problem List Items  Addressed This Visit       Cardiovascular and Mediastinum   Hypertension associated with type 2 diabetes mellitus (HCC)   Relevant Medications   metFORMIN (GLUCOPHAGE-XR) 500 MG 24 hr tablet   dapagliflozin  propanediol (FARXIGA) 5 MG TABS tablet   Other Relevant Orders   CBC with Differential/Platelet   CMP14+EGFR   Lipid panel   PSA, total and free   Pneumococcal conjugate vaccine 20-valent (Prevnar 20) (Completed)   Coronary artery disease involving native coronary artery of native heart with angina pectoris (HCC)     Endocrine   DM (diabetes mellitus) (HCC) - Primary A1C 7.2 today, will add Farxiga to regimen. Diet and exercise encouraged.    Relevant Medications   metFORMIN (GLUCOPHAGE-XR) 500 MG 24 hr tablet   dapagliflozin propanediol (FARXIGA) 5 MG TABS tablet   Other Relevant Orders   Bayer DCA Hb A1c Waived   Vitamin B12   Pneumococcal conjugate vaccine 20-valent (Prevnar 20) (Completed)   Diabetic polyneuropathy associated with type 2 diabetes mellitus (HCC)   Relevant Medications   gabapentin (NEURONTIN) 300 MG capsule   metFORMIN (GLUCOPHAGE-XR) 500 MG 24 hr tablet   dapagliflozin propanediol (FARXIGA) 5 MG TABS tablet   Hyperlipidemia associated with type 2 diabetes mellitus (HCC)   Relevant Medications   metFORMIN (GLUCOPHAGE-XR) 500 MG 24 hr tablet   dapagliflozin propanediol (FARXIGA) 5 MG TABS tablet   Other Relevant Orders   Lipid panel   Other Visit Diagnoses     Annual physical exam      Health maintenance updated. Labs pending.     Return in about 3 months (around 01/07/2023) for DM, 1 year physical .     Kari Baars, FNP

## 2022-10-07 NOTE — Patient Instructions (Addendum)
Dr. Wynona Neat Pulmonology  Follow up with ENT Follow up with GI - possible EGD

## 2022-10-08 LAB — PSA, TOTAL AND FREE

## 2022-10-08 LAB — SPECIMEN STATUS REPORT

## 2022-10-20 ENCOUNTER — Encounter (INDEPENDENT_AMBULATORY_CARE_PROVIDER_SITE_OTHER): Payer: Medicare HMO | Admitting: Ophthalmology

## 2022-10-23 ENCOUNTER — Encounter (INDEPENDENT_AMBULATORY_CARE_PROVIDER_SITE_OTHER): Payer: Medicare HMO | Admitting: Ophthalmology

## 2022-10-23 DIAGNOSIS — E113313 Type 2 diabetes mellitus with moderate nonproliferative diabetic retinopathy with macular edema, bilateral: Secondary | ICD-10-CM

## 2022-10-23 DIAGNOSIS — H35033 Hypertensive retinopathy, bilateral: Secondary | ICD-10-CM

## 2022-10-23 DIAGNOSIS — Z7984 Long term (current) use of oral hypoglycemic drugs: Secondary | ICD-10-CM | POA: Diagnosis not present

## 2022-10-23 DIAGNOSIS — I10 Essential (primary) hypertension: Secondary | ICD-10-CM

## 2022-10-23 DIAGNOSIS — H43813 Vitreous degeneration, bilateral: Secondary | ICD-10-CM | POA: Diagnosis not present

## 2022-11-04 ENCOUNTER — Ambulatory Visit (INDEPENDENT_AMBULATORY_CARE_PROVIDER_SITE_OTHER)
Admission: RE | Admit: 2022-11-04 | Discharge: 2022-11-04 | Disposition: A | Discharge status: Home / Self Care | Payer: Medicare HMO | Source: Ambulatory Visit | Attending: Vascular Surgery | Admitting: Vascular Surgery

## 2022-11-04 ENCOUNTER — Ambulatory Visit: Payer: Medicare HMO | Admitting: Physician Assistant

## 2022-11-04 ENCOUNTER — Ambulatory Visit (HOSPITAL_COMMUNITY)
Admission: RE | Admit: 2022-11-04 | Discharge: 2022-11-04 | Disposition: A | Discharge status: Home / Self Care | Payer: Medicare HMO | Source: Ambulatory Visit | Attending: Vascular Surgery | Admitting: Vascular Surgery

## 2022-11-04 VITALS — BP 116/76 | HR 62 | Temp 97.9°F | Resp 18 | Ht 70.5 in | Wt 218.9 lb

## 2022-11-04 DIAGNOSIS — T82858A Stenosis of vascular prosthetic devices, implants and grafts, initial encounter: Secondary | ICD-10-CM

## 2022-11-04 DIAGNOSIS — I70221 Atherosclerosis of native arteries of extremities with rest pain, right leg: Secondary | ICD-10-CM

## 2022-11-04 DIAGNOSIS — I70222 Atherosclerosis of native arteries of extremities with rest pain, left leg: Secondary | ICD-10-CM | POA: Insufficient documentation

## 2022-11-04 LAB — VAS US ABI WITH/WO TBI: Left ABI: 0.96

## 2022-11-04 NOTE — Progress Notes (Signed)
Office Note     CC:  follow up Requesting Provider:  Sonny Masters, FNP  HPI: [name] is a 72 y.o. (1951-02-05) male who presents for follow up of PAD. He has hx of left CFA to AT bypass with non reversed GSV for CLI with tissue loss on 01/29/2022 by Dr. Chestine Spore.  He had left 2nd toe amputation by Dr. Ardelle Anton on 02/04/2022. His left 2nd toe remains healed. He does suffer from diabetic neuropathy. He does have a lot of decreased sensation and coldness in his feet bilaterally. However he explains that over past three month he has had pain in the right greater than left foot at rest. He says he is unable to sit or lay down with out pain. He says at lease one night per week he does not sleep at all and the other nights he may try to go to bed at 10 and will be awake until 2 or earlier in the morning due to pain. He describes it as aching and coldness. He says he cannot get in comfortable position and just tosses and turns and eventually he just has to get up and walk around. He says he really can't describe the feeling in his feet. He says he does not really have any pain on  ambulation but he has some weakness in his legs. The weakness occurs only really on prolonged ambulation or when walking up an incline or stairs. He does not have any tissue loss.   The pt is on a statin for cholesterol management.    The pt is on an aspirin.  Other AC:  none The pt is on ACEI for hypertension.  The pt does have diabetes. Tobacco hx:  never   Past Medical History:  Diagnosis Date   Allergy    seasonal   Arthritis    " all over me- back, hips, knees, hands, everywhere "   Cancer [facility])    Prostate cancer age 45; Lupron treatments followed by prostatectomy.   Cataract    left removed, forming right   Chronic constipation    x 23 yrs after prostate surgery   Diabetes mellitus without complication (HCC)    Diabetic retinal damage of both eyes (HCC)    gets shots every month   GERD  (gastroesophageal reflux disease)    Hyperlipidemia    Hypertension    Myocardial infarction [facility])    age 36; no stenting.   OSA (obstructive sleep apnea)    non-compliant with CPAP.   Sleep apnea    no cpap    Past Surgical History:  Procedure Laterality Date   ABDOMINAL AORTOGRAM W/LOWER EXTREMITY N/A 01/17/2022   Procedure: ABDOMINAL AORTOGRAM W/LOWER EXTREMITY;  Surgeon: Chuck Hint, MD;  Location[facility] INVASIVE CV LAB;  Service: Cardiovascular;  Laterality: N/A;   AMPUTATION TOE Left 02/04/2022   Procedure: AMPUTATION  SECOND TOE;  Surgeon: Vivi Barrack, DPM;  Location: [facility] OR;  Service: Podiatry;  Laterality: Left;   APPENDECTOMY     CHOLECYSTECTOMY     FEMORAL-TIBIAL BYPASS GRAFT Left 01/29/2022   Procedure: LEFT COMMON FEMORAL BYPASS TO ANTERIOR TIBIAL ARTERY;  Surgeon: Cephus Shelling, MD;  Location: [facility] OR;  Service: Vascular;  Laterality: Left;   PROSTATE SURGERY     VEIN HARVEST Left 01/29/2022   Procedure: HARVEST OF LEFT LEG GREAT SAPHENOUS VEIN;  Surgeon: Cephus Shelling, MD;  Location: [facility] OR;  Service: Vascular;  Laterality: Left;    Social  History   Socioeconomic History   Marital status: Single    Spouse name: GF-Betty   Number of children: 2   Years of education: 11   Highest education level: Not on file  Occupational History   Occupation: Event organiser: [employer]   Tobacco Use   Smoking status: Never    Passive exposure: Never   Smokeless tobacco: Former    Types: Engineer, drilling   Vaping status: Never Used  Substance and Sexual Activity   Alcohol use: Yes    Comment: social   Drug use: Not Currently   Sexual activity: Not Currently  Other Topics Concern   Not on file  Social History Narrative   Marital status: divorced, girlfriend x 1977     Children: 1      Tobacco: none; chew tobacco      Alcohol: beers on weekends      Exercise: none   Social Determinants of Health   Financial Resource  Strain: Low Risk  (11/25/2020)   Overall Financial Resource Strain (CARDIA)    Difficulty of Paying Living Expenses: Not hard at all  Food Insecurity: Food Insecurity Present (02/06/2022)   Hunger Vital Sign    Worried About Running Out of Food in the Last Year: Sometimes true    Ran Out of Food in the Last Year: Sometimes true  Transportation Needs: No Transportation Needs (02/12/2022)   PRAPARE - Administrator, Civil Service (Medical): No    Lack of Transportation (Non-Medical): No  Physical Activity: Insufficiently Active (11/25/2020)   Exercise Vital Sign    Days of Exercise per Week: 7 days    Minutes of Exercise per Session: 10 min  Stress: Stress Concern Present (11/25/2020)   [employer] of Occupational Health - Occupational Stress Questionnaire    Feeling of Stress : To some extent  Social Connections: Moderately Isolated (11/25/2020)   Social Connection and Isolation Panel [NHANES]    Frequency of Communication with Friends and Family: More than three times a week    Frequency of Social Gatherings with Friends and Family: More than three times a week    Attends Religious Services: More than 4 times per year    Active Member of [employer] or Organizations: No    Attends Banker Meetings: Never    Marital Status: Divorced  Catering manager Violence: Not At Risk (02/06/2022)   Humiliation, Afraid, Rape, and Kick questionnaire    Fear of Current or Ex-Partner: No    Emotionally Abused: No    Physically Abused: No    Sexually Abused: No    Family History  Problem Relation Age of Onset   Cancer Mother        leukemia   Diabetes Mother    Cancer Father 52       lung cancer with mets   Colon cancer Maternal Grandmother    Heart disease Maternal Grandmother    Heart disease Maternal Grandfather    Heart disease Paternal Grandmother    Cancer Paternal Grandfather        lung, prostate   Esophageal cancer Neg Hx    Colon polyps Neg Hx    Stomach  cancer Neg Hx    Rectal cancer Neg Hx     Current Outpatient Medications  Medication Sig Dispense Refill   aspirin 81 MG tablet Take 81 mg by mouth daily.     BAYER CONTOUR NEXT TEST test strip CHECK BLOOD SUGAR  TWICE DAILY 100 each 5   BAYER MICROLET LANCETS lancets Check blood sugar two times daily. 100 each 5   Blood Glucose Monitoring Suppl (BAYER CONTOUR NEXT MONITOR) w/Device KIT Check blood sugar two times daily. 1 kit 2   ciprofloxacin (CILOXAN) 0.3 % ophthalmic solution Place 1 drop into both eyes See admin instructions. NSTILL ONE DROP INTO BOTH EYES 4 TIMES A DAY FOR TWO DAYS AFTER EACH MONTHLY EYE INJECTION     dapagliflozin propanediol (FARXIGA) 5 MG TABS tablet Take 1 tablet (5 mg total) by mouth daily before breakfast. 30 tablet 6   fluticasone (FLONASE) 50 MCG/ACT nasal spray Place 1 spray into both nostrils daily as needed for allergies or rhinitis.     gabapentin (NEURONTIN) 300 MG capsule Take 3 capsules (900 mg total) by mouth 3 (three) times daily. 810 capsule 0   lisinopril (ZESTRIL) 5 MG tablet Take 1 tablet (5 mg total) by mouth daily. 90 tablet 3   metFORMIN (GLUCOPHAGE-XR) 500 MG 24 hr tablet Take 2 tablets (1,000 mg total) by mouth daily with breakfast AND 2 tablets (1,000 mg total) every evening. 4 tablets daily. 360 tablet 1   pantoprazole (PROTONIX) 40 MG tablet TAKE 1 TABLET (40 MG TOTAL) BY MOUTH TWICE A DAY BEFORE MEALS 180 tablet 0   rosuvastatin (CRESTOR) 20 MG tablet Take 1 tablet (20 mg total) by mouth daily. 30 tablet 11   No current facility-administered medications for this visit.    Allergies  Allergen Reactions   Cymbalta [Duloxetine Hcl] Nausea And Vomiting   Darvocet [Propoxyphene N-Acetaminophen] Hives     REVIEW OF SYSTEMS:   [X]  denotes positive finding, [ ]  denotes negative finding Cardiac  Comments:  Chest pain or chest pressure:    Shortness of breath upon exertion:    Short of breath when lying flat:    Irregular heart rhythm:         Vascular    Pain in calf, thigh, or hip brought on by ambulation:    Pain in feet at night that wakes you up from your sleep:     Blood clot in your veins:    Leg swelling:         Pulmonary    Oxygen at home:    Productive cough:     Wheezing:         Neurologic    Sudden weakness in arms or legs:     Sudden numbness in arms or legs:     Sudden onset of difficulty speaking or slurred speech:    Temporary loss of vision in one eye:     Problems with dizziness:         Gastrointestinal    Blood in stool:     Vomited blood:         Genitourinary    Burning when urinating:     Blood in urine:        Psychiatric    Major depression:         Hematologic    Bleeding problems:    Problems with blood clotting too easily:        Skin    Rashes or ulcers:        Constitutional    Fever or chills:      PHYSICAL EXAMINATION:  Vitals:   11/04/22 0943  BP: 116/76  Pulse: 62  Resp: 18  Temp: 97.9 F (36.6 C)  TempSrc: Temporal  SpO2: 96%  Weight: 218 lb 14.4  oz (99.3 kg)  Height: 5' 10.5" (1.791 m)    General:  WDWN in NAD; vital signs documented above Gait: Normal HENT: WNL, normocephalic Pulmonary: normal non-labored breathing , without wheezing Cardiac: regular HR Abdomen: soft, NT, no masses Vascular Exam/Pulses: 2+ femoral pulses, palpable pulse in left lower extremity bypass graft, 2+ left DP pulse. No palpable pulses in right foot. Toes cool but feet warm bilaterally  Extremities: without ischemic changes, without Gangrene , without cellulitis; without open wounds;  Musculoskeletal: no muscle wasting or atrophy  Neurologic: A&O X 3 Psychiatric:  The pt has Normal affect.   Non-Invasive Vascular Imaging:   Left Graft #1: CFA-ATA  +--------------------+--------+---------------+--------+--------+                     PSV cm/sStenosis       WaveformComments  +--------------------+--------+---------------+--------+--------+  Inflow              103                    biphasic          +--------------------+--------+---------------+--------+--------+  Proximal Anastomosis210     50-70% stenosisbiphasic          +--------------------+--------+---------------+--------+--------+  Proximal Graft      83                     biphasic          +--------------------+--------+---------------+--------+--------+  Mid Graft           89                     biphasic          +--------------------+--------+---------------+--------+--------+  Distal Graft        42                     biphasic          +--------------------+--------+---------------+--------+--------+  Distal Anastomosis  286     50-70% stenosisbiphasic          +--------------------+--------+---------------+--------+--------+  Outflow            92                     biphasic          +--------------------+--------+---------------+--------+--------+   Summary:  Left: Patent bypass graft with velocities suggestive of a 50-70% stenosis involving the proximal and distal anastomoses.   +-------+-----------+-----------+------------+------------+  ABI/TBIToday's ABIToday's TBIPrevious ABIPrevious TBI  +-------+-----------+-----------+------------+------------+  Right [place]         0.44       [place]          0.39          +-------+-----------+-----------+------------+------------+  Left  0.96       0.61       0.95        0.95          +-------+-----------+-----------+------------+------------+    ASSESSMENT/PLAN:: 72 y.o. male here for follow up for PAD. He has hx of left CFA to AT bypass with non reversed GSV for CLI with tissue loss on 01/29/2022 by Dr. Chestine Spore.  He had left 2nd toe amputation by Dr. Ardelle Anton on 02/04/2022. His left 2nd toe remains healed. He does suffer from diabetic neuropathy. However he explains that over past three month he has had pain in the right greater than left foot at rest. This is keeping him awake at  night.  He has some claudication but this only really occurs on prolonged ambulation or when walking up an incline or stairs. He does not have any tissue loss.  - ABI today is overall stable. Slight decrease in left TBI - Duplex shows elevated velocities in the LLE bypass graft at the proximal and distal anastomosis. These velocities have increased since prior duplex - Continue Aspirin and Statin  - encourage walking regimen to help promote collaterals  - He has atherosclerosis of the native arteries of the Bilateral lower extremities causing ischemic rest pain in the right leg. The patient is on best medical therapy for peripheral arterial disease. The patient has been counseled about the risks of tobacco use in atherosclerotic disease. The patient has been counseled to abstain from any tobacco use. An aortogram with bilateral lower extremity runoff angiography and Bilateral lower extremity intervention and is indicated to better evaluate the patient's lower extremity circulation because of the limb threatening nature of the patient's diagnosis. Based on the patient's clinical exam and non-invasive data, we anticipate an endovascular intervention in the tibial vessels of the right lower extremity. Duplex today also shows elevated velocities in the proximal and distal anastomosis of the LLE bypass graft with concerns of threatened bypass. Will plan for intervention of the right lower extremity with bilateral runoff to evaluated the LLE bypass as well. - Will arrange this in the near future with Dr. Loistine Chance, PA-C [facility] [phone]  Clinic MD:   Steve Rattler

## 2022-11-19 ENCOUNTER — Other Ambulatory Visit: Payer: Self-pay | Admitting: *Deleted

## 2022-11-19 DIAGNOSIS — I70221 Atherosclerosis of native arteries of extremities with rest pain, right leg: Secondary | ICD-10-CM

## 2022-11-20 ENCOUNTER — Other Ambulatory Visit: Payer: Self-pay | Admitting: Nurse Practitioner

## 2022-11-20 ENCOUNTER — Encounter (INDEPENDENT_AMBULATORY_CARE_PROVIDER_SITE_OTHER): Payer: Medicare HMO | Admitting: Ophthalmology

## 2022-11-20 DIAGNOSIS — H35033 Hypertensive retinopathy, bilateral: Secondary | ICD-10-CM | POA: Diagnosis not present

## 2022-11-20 DIAGNOSIS — E113313 Type 2 diabetes mellitus with moderate nonproliferative diabetic retinopathy with macular edema, bilateral: Secondary | ICD-10-CM | POA: Diagnosis not present

## 2022-11-20 DIAGNOSIS — Z7984 Long term (current) use of oral hypoglycemic drugs: Secondary | ICD-10-CM | POA: Diagnosis not present

## 2022-11-20 DIAGNOSIS — I10 Essential (primary) hypertension: Secondary | ICD-10-CM | POA: Diagnosis not present

## 2022-11-20 DIAGNOSIS — H43813 Vitreous degeneration, bilateral: Secondary | ICD-10-CM

## 2022-11-24 ENCOUNTER — Ambulatory Visit (HOSPITAL_COMMUNITY)
Admission: RE | Admit: 2022-11-24 | Discharge: 2022-11-24 | Disposition: A | Discharge status: Home / Self Care | Payer: Medicare HMO | Source: Ambulatory Visit | Attending: Surgery | Admitting: Surgery

## 2022-11-24 DIAGNOSIS — I70221 Atherosclerosis of native arteries of extremities with rest pain, right leg: Secondary | ICD-10-CM

## 2022-12-01 ENCOUNTER — Ambulatory Visit (INDEPENDENT_AMBULATORY_CARE_PROVIDER_SITE_OTHER): Payer: Medicare HMO

## 2022-12-01 VITALS — Ht 70.0 in | Wt 214.0 lb

## 2022-12-01 DIAGNOSIS — Z0000 Encounter for general adult medical examination without abnormal findings: Secondary | ICD-10-CM

## 2022-12-01 NOTE — Progress Notes (Signed)
Subjective:   [name] is a 72 y.o. male who presents for Medicare Annual/Subsequent preventive examination.  Visit Complete: Virtual  I connected with  [name] on 12/01/22 by a audio enabled telemedicine application and verified that I am speaking with the correct person using two identifiers.  Patient Location: Home  Provider Location: Home Office  I discussed the limitations of evaluation and management by telemedicine. The patient expressed understanding and agreed to proceed.  Patient Medicare AWV questionnaire was completed by the patient on 12/01/2022; I have confirmed that all information answered by patient is correct and no changes since this date.  Cardiac Risk Factors include: advanced age (>70men, >58 women);diabetes mellitus;dyslipidemia;male gender;hypertension;sedentary lifestyleBecause this visit was a virtual/telehealth visit, some criteria may be missing or patient reported. Any vitals not documented were not able to be obtained and vitals that have been documented are patient reported.       Objective:    Today's Vitals   12/01/22 1340  Weight: 214 lb (97.1 kg)  Height: 5\' 10"  (1.778 m)   Body mass index is 30.71 kg/m.     12/01/2022    1:44 PM 02/06/2022    6:00 PM 01/17/2022    7:03 AM 11/25/2020    1:11 PM 05/06/2019    9:13 AM 10/02/2016    1:48 PM  Advanced Directives  Does Patient Have a Medical Advance Directive? Yes No Yes No Yes No  Type of Estate agent of [place];Living will  Healthcare Power of [place];Living will  Living will   Does patient want to make changes to medical advance directive?  Yes (Inpatient - patient requests chaplain consult to change a medical advance directive) No - Patient declined     Copy of Healthcare Power of Attorney in Chart? No - copy requested       Would patient like information on creating a medical advance directive?  Yes (Inpatient - patient requests chaplain consult to  create a medical advance directive)        Current Medications (verified) Outpatient Encounter Medications as of 12/01/2022  Medication Sig   aspirin 81 MG tablet Take 81 mg by mouth daily.   BAYER CONTOUR NEXT TEST test strip CHECK BLOOD SUGAR TWICE DAILY   BAYER MICROLET LANCETS lancets Check blood sugar two times daily.   Blood Glucose Monitoring Suppl (BAYER CONTOUR NEXT MONITOR) w/Device KIT Check blood sugar two times daily.   ciprofloxacin (CILOXAN) 0.3 % ophthalmic solution Place 1 drop into both eyes See admin instructions. NSTILL ONE DROP INTO BOTH EYES 4 TIMES A DAY FOR TWO DAYS AFTER EACH MONTHLY EYE INJECTION   dapagliflozin propanediol (FARXIGA) 5 MG TABS tablet Take 1 tablet (5 mg total) by mouth daily before breakfast.   fluticasone (FLONASE) 50 MCG/ACT nasal spray Place 1 spray into both nostrils daily as needed for allergies or rhinitis.   gabapentin (NEURONTIN) 300 MG capsule Take 3 capsules (900 mg total) by mouth 3 (three) times daily.   lisinopril (ZESTRIL) 5 MG tablet Take 1 tablet (5 mg total) by mouth daily.   metFORMIN (GLUCOPHAGE-XR) 500 MG 24 hr tablet Take 2 tablets (1,000 mg total) by mouth daily with breakfast AND 2 tablets (1,000 mg total) every evening. 4 tablets daily.   pantoprazole (PROTONIX) 40 MG tablet TAKE 1 TABLET (40 MG TOTAL) BY MOUTH TWICE A DAY BEFORE MEALS   rosuvastatin (CRESTOR) 20 MG tablet Take 1 tablet (20 mg total) by mouth daily.  No facility-administered encounter medications on file as of 12/01/2022.    Allergies (verified) Cymbalta [duloxetine hcl] and Darvocet [propoxyphene n-acetaminophen]   History: Past Medical History:  Diagnosis Date   Allergy    seasonal   Arthritis    " all over me- back, hips, knees, hands, everywhere "   Cancer [facility])    Prostate cancer age 41; Lupron treatments followed by prostatectomy.   Cataract    left removed, forming right   Chronic constipation    x 23 yrs after prostate surgery   Diabetes  mellitus without complication (HCC)    Diabetic retinal damage of both eyes (HCC)    gets shots every month   GERD (gastroesophageal reflux disease)    Hyperlipidemia    Hypertension    Myocardial infarction [facility])    age 44; no stenting.   OSA (obstructive sleep apnea)    non-compliant with CPAP.   Sleep apnea    no cpap   Past Surgical History:  Procedure Laterality Date   ABDOMINAL AORTOGRAM W/LOWER EXTREMITY N/A 01/17/2022   Procedure: ABDOMINAL AORTOGRAM W/LOWER EXTREMITY;  Surgeon: Chuck Hint, MD;  Location: [facility] INVASIVE CV LAB;  Service: Cardiovascular;  Laterality: N/A;   AMPUTATION TOE Left 02/04/2022   Procedure: AMPUTATION  SECOND TOE;  Surgeon: Vivi Barrack, DPM;  Location: [facility] OR;  Service: Podiatry;  Laterality: Left;   APPENDECTOMY     CHOLECYSTECTOMY     FEMORAL-TIBIAL BYPASS GRAFT Left 01/29/2022   Procedure: LEFT COMMON FEMORAL BYPASS TO ANTERIOR TIBIAL ARTERY;  Surgeon: Cephus Shelling, MD;  Location: [facility] OR;  Service: Vascular;  Laterality: Left;   PROSTATE SURGERY     VEIN HARVEST Left 01/29/2022   Procedure: HARVEST OF LEFT LEG GREAT SAPHENOUS VEIN;  Surgeon: Cephus Shelling, MD;  Location: [facility] OR;  Service: Vascular;  Laterality: Left;   Family History  Problem Relation Age of Onset   Cancer Mother        leukemia   Diabetes Mother    Cancer Father 39       lung cancer with mets   Colon cancer Maternal Grandmother    Heart disease Maternal Grandmother    Heart disease Maternal Grandfather    Heart disease Paternal Grandmother    Cancer Paternal Grandfather        lung, prostate   Esophageal cancer Neg Hx    Colon polyps Neg Hx    Stomach cancer Neg Hx    Rectal cancer Neg Hx    Social History   Socioeconomic History   Marital status: Single    Spouse name: GF-Betty   Number of children: 2   Years of education: 11   Highest education level: Not on file  Occupational History   Occupation: Event organiser:  [employer]   Tobacco Use   Smoking status: Never    Passive exposure: Never   Smokeless tobacco: Former    Types: Engineer, drilling   Vaping status: Never Used  Substance and Sexual Activity   Alcohol use: Yes    Comment: social   Drug use: Not Currently   Sexual activity: Not Currently  Other Topics Concern   Not on file  Social History Narrative   Marital status: divorced, girlfriend x 1977     Children: 1      Tobacco: none; chew tobacco      Alcohol: beers on weekends      Exercise: none   Social Determinants  of Health   Financial Resource Strain: Low Risk  (12/01/2022)   Overall Financial Resource Strain (CARDIA)    Difficulty of Paying Living Expenses: Not hard at all  Food Insecurity: No Food Insecurity (12/01/2022)   Hunger Vital Sign    Worried About Running Out of Food in the Last Year: Never true    Ran Out of Food in the Last Year: Never true  Transportation Needs: No Transportation Needs (12/01/2022)   PRAPARE - Administrator, Civil Service (Medical): No    Lack of Transportation (Non-Medical): No  Physical Activity: Inactive (12/01/2022)   Exercise Vital Sign    Days of Exercise per Week: 0 days    Minutes of Exercise per Session: 0 min  Stress: No Stress Concern Present (12/01/2022)   [employer] of Occupational Health - Occupational Stress Questionnaire    Feeling of Stress : Not at all  Social Connections: Moderately Isolated (12/01/2022)   Social Connection and Isolation Panel [NHANES]    Frequency of Communication with Friends and Family: More than three times a week    Frequency of Social Gatherings with Friends and Family: More than three times a week    Attends Religious Services: More than 4 times per year    Active Member of [employer] or Organizations: No    Attends Engineer, structural: Never    Marital Status: Divorced    Tobacco Counseling Counseling given: Not Answered   Clinical Intake:  Pre-visit  preparation completed: Yes  Pain : No/denies pain     Nutritional Risks: None Diabetes: Yes CBG done?: No Did pt. bring in CBG monitor from home?: No  How often do you need to have someone help you when you read instructions, pamphlets, or other written materials from your doctor or pharmacy?: 1 - Never  Interpreter Needed?: No  Information entered by :: Renie Ora, LPN   Activities of Daily Living    12/01/2022    1:44 PM 02/06/2022    6:00 PM  In your present state of health, do you have any difficulty performing the following activities:  Hearing? 0 0  Vision? 0 0  Difficulty concentrating or making decisions? 0 0  Walking or climbing stairs? 0 1  Dressing or bathing? 0 1  Doing errands, shopping? 0 0  Preparing Food and eating ? N   Using the Toilet? N   In the past six months, have you accidently leaked urine? N   Do you have problems with loss of bowel control? N   Managing your Medications? N   Managing your Finances? N   Housekeeping or managing your Housekeeping? N     Patient Care Team: Sonny Masters, FNP as PCP - General (Family Medicine) Reather Littler, MD (Inactive) as Consulting Physician (Endocrinology) Newman Pies, MD as Consulting Physician (Otolaryngology) [facility] Sherrie George, MD as Consulting Physician (Ophthalmology) Quentin Angst, MD as Consulting Physician (Internal Medicine)  Indicate any recent Medical Services you may have received from other than [facility] providers in the past year (date may be approximate).     Assessment:   This is a routine wellness examination for [name]  Hearing/Vision screen Vision Screening - Comments:: Wears rx glasses - up to date with routine eye exams with  Dr.Matthews    Goals Addressed             This Visit's Progress    Exercise 3x per week (30 min per time)  Depression Screen    12/01/2022    1:42 PM 10/07/2022    8:15 AM 06/05/2022    9:09 AM 04/15/2022     8:04 AM 03/05/2022    9:47 AM 11/28/2021    4:01 PM 08/02/2021    8:58 AM  PHQ 2/9 Scores  PHQ - 2 Score 0 0 1 0 0 0 0  PHQ- 9 Score 0 4 8  0 4     Fall Risk    12/01/2022    1:40 PM 10/07/2022    8:15 AM 06/05/2022    9:09 AM 04/15/2022    8:04 AM 03/05/2022    9:47 AM  Fall Risk   Falls in the past year? 0 0 0 0 0  Number falls in past yr: 0   0   Injury with Fall? 0   0   Risk for fall due to : No Fall Risks      Follow up Falls prevention discussed        MEDICARE RISK AT HOME: Medicare Risk at Home Any stairs in or around the home?: No If so, are there any without handrails?: No Home free of loose throw rugs in walkways, pet beds, electrical cords, etc?: Yes Adequate lighting in your home to reduce risk of falls?: Yes Life alert?: No Use of a cane, walker or w/c?: No Grab bars in the bathroom?: Yes Shower chair or bench in shower?: Yes Elevated toilet seat or a handicapped toilet?: Yes  TIMED UP AND GO:  Was the test performed?  No    Cognitive Function:        12/01/2022    1:45 PM 11/25/2020    1:14 PM 05/06/2019    9:05 AM  6CIT Screen  What Year? 0 points 0 points 0 points  What month? 0 points 0 points 0 points  What time? 0 points 0 points 0 points  Count back from 20 0 points 0 points 0 points  Months in reverse 0 points 4 points 2 points  Repeat phrase 0 points 2 points 2 points  Total Score 0 points 6 points 4 points    Immunizations Immunization History  Administered Date(s) Administered   Influenza Split 11/20/2020   Influenza, High Dose Seasonal PF 11/24/2019   Influenza,inj,Quad PF,6+ Mos 11/04/2016, 12/27/2017, 11/09/2018   Influenza-Unspecified 11/16/2015, 12/27/2017, 12/27/2017, 12/12/2020, 12/04/2021   Moderna Sars-Covid-2 Vaccination 05/13/2019, 06/15/2019, 02/23/2020, 12/20/2020   PNEUMOCOCCAL CONJUGATE-20 10/07/2022   Pneumococcal Polysaccharide-23 08/04/2015, 11/09/2018   Td 03/07/1995   Td (Adult),5 Lf Tetanus Toxid, Preservative Free  03/07/1995   Tdap 09/26/2012, 07/14/2017   Varicella 12/04/2021   Zoster Recombinant(Shingrix) 12/04/2021    TDAP status: Up to date  Flu Vaccine status: Due, Education has been provided regarding the importance of this vaccine. Advised may receive this vaccine at local pharmacy or Health Dept. Aware to provide a copy of the vaccination record if obtained from local pharmacy or Health Dept. Verbalized acceptance and understanding.  Pneumococcal vaccine status: Up to date  Covid-19 vaccine status: Completed vaccines  Qualifies for Shingles Vaccine? Yes   Zostavax completed Yes   Shingrix Completed?: Yes  Screening Tests Health Maintenance  Topic Date Due   COVID-19 Vaccine (5 - 2023-24 season) 11/02/2022   OPHTHALMOLOGY EXAM  11/22/2022   FOOT EXAM  11/23/2022   Zoster Vaccines- Shingrix (2 of 2) 01/07/2023 (Originally 01/29/2022)   INFLUENZA VACCINE  06/01/2023 (Originally 10/02/2022)   Diabetic kidney evaluation - Urine ACR  03/06/2023  HEMOGLOBIN A1C  04/09/2023   Diabetic kidney evaluation - eGFR measurement  10/07/2023   Medicare Annual Wellness (AWV)  12/01/2023   Colonoscopy  07/24/2024   DTaP/Tdap/Td (5 - Td or Tdap) 07/15/2027   Pneumonia Vaccine 46+ Years old  Completed   Hepatitis C Screening  Completed   HPV VACCINES  Aged Out    Health Maintenance  Health Maintenance Due  Topic Date Due   COVID-19 Vaccine (5 - 2023-24 season) 11/02/2022   OPHTHALMOLOGY EXAM  11/22/2022   FOOT EXAM  11/23/2022    Colorectal cancer screening: Type of screening: Colonoscopy. Completed 07/24/2021. Repeat every 3 years  Lung Cancer Screening: (Low Dose CT Chest recommended if Age 78-80 years, 20 pack-year currently smoking OR have quit w/in 15years.) does not qualify.   Lung Cancer Screening Referral: n/a  Additional Screening:  Hepatitis C Screening: does not qualify; Completed 08/04/2015  Vision Screening: Recommended annual ophthalmology exams for early detection of  glaucoma and other disorders of the eye. Is the patient up to date with their annual eye exam?  Yes  Who is the provider or what is the name of the office in which the patient attends annual eye exams? Dr Ashley Royalty  If pt is not established with a provider, would they like to be referred to a provider to establish care? No .   Dental Screening: Recommended annual dental exams for proper oral hygiene  Diabetic Foot Exam: Diabetic Foot Exam: Overdue, Pt has been advised about the importance in completing this exam. Pt is scheduled for diabetic foot exam on next office visit .  Community Resource Referral / Chronic Care Management: CRR required this visit?  No   CCM required this visit?  No     Plan:     I have personally reviewed and noted the following in the patient's chart:   Medical and social history Use of alcohol, tobacco or illicit drugs  Current medications and supplements including opioid prescriptions. Patient is not currently taking opioid prescriptions. Functional ability and status Nutritional status Physical activity Advanced directives List of other physicians Hospitalizations, surgeries, and ER visits in previous 12 months Vitals Screenings to include cognitive, depression, and falls Referrals and appointments  In addition, I have reviewed and discussed with patient certain preventive protocols, quality metrics, and best practice recommendations. A written personalized care plan for preventive services as well as general preventive health recommendations were provided to patient.     Lorrene Reid, LPN   [id number]   After Visit Summary: (MyChart) Due to this being a telephonic visit, the after visit summary with patients personalized plan was offered to patient via MyChart   Nurse Notes: none

## 2022-12-01 NOTE — Patient Instructions (Signed)
[name] , Thank you for taking time to come for your Medicare Wellness Visit. I appreciate your ongoing commitment to your health goals. Please review the following plan we discussed and let me know if I can assist you in the future.   Referrals/Orders/Follow-Ups/Clinician Recommendations: Aim for 30 minutes of exercise or brisk walking, 6-8 glasses of water, and 5 servings of fruits and vegetables each day.   This is a list of the screening recommended for you and due dates:  Health Maintenance  Topic Date Due   COVID-19 Vaccine (5 - 2023-24 season) 11/02/2022   Eye exam for diabetics  11/22/2022   Complete foot exam   11/23/2022   Zoster (Shingles) Vaccine (2 of 2) 01/07/2023*   Flu Shot  06/01/2023*   Yearly kidney health urinalysis for diabetes  03/06/2023   Hemoglobin A1C  04/09/2023   Yearly kidney function blood test for diabetes  10/07/2023   Medicare Annual Wellness Visit  12/01/2023   Colon Cancer Screening  07/24/2024   DTaP/Tdap/Td vaccine (5 - Td or Tdap) 07/15/2027   Pneumonia Vaccine  Completed   Hepatitis C Screening  Completed   HPV Vaccine  Aged Out  *Topic was postponed. The date shown is not the original due date.    Advanced directives: (Copy Requested) Please bring a copy of your health care power of attorney and living will to the office to be added to your chart at your convenience.  Next Medicare Annual Wellness Visit scheduled for next year: Yes  insert Preventive Care attachment Insert FALL PREVENTION attachment if needed

## 2022-12-02 ENCOUNTER — Other Ambulatory Visit: Payer: Self-pay

## 2022-12-02 DIAGNOSIS — T82858A Stenosis of vascular prosthetic devices, implants and grafts, initial encounter: Secondary | ICD-10-CM

## 2022-12-02 DIAGNOSIS — I70221 Atherosclerosis of native arteries of extremities with rest pain, right leg: Secondary | ICD-10-CM

## 2022-12-04 ENCOUNTER — Other Ambulatory Visit: Payer: Self-pay

## 2022-12-04 ENCOUNTER — Encounter (HOSPITAL_COMMUNITY)
Admission: RE | Disposition: A | Discharge status: Home / Self Care | Payer: Self-pay | Source: Home / Self Care | Attending: Vascular Surgery

## 2022-12-04 ENCOUNTER — Inpatient Hospital Stay (HOSPITAL_COMMUNITY)
Admission: RE | Admit: 2022-12-04 | Discharge: 2022-12-06 | DRG: 279 | Disposition: A | Discharge status: Home / Self Care | Payer: Medicare HMO | Attending: Vascular Surgery | Admitting: Vascular Surgery

## 2022-12-04 DIAGNOSIS — Z7984 Long term (current) use of oral hypoglycemic drugs: Secondary | ICD-10-CM | POA: Diagnosis not present

## 2022-12-04 DIAGNOSIS — E1140 Type 2 diabetes mellitus with diabetic neuropathy, unspecified: Secondary | ICD-10-CM | POA: Diagnosis present

## 2022-12-04 DIAGNOSIS — I70222 Atherosclerosis of native arteries of extremities with rest pain, left leg: Secondary | ICD-10-CM | POA: Diagnosis not present

## 2022-12-04 DIAGNOSIS — F17220 Nicotine dependence, chewing tobacco, uncomplicated: Secondary | ICD-10-CM | POA: Diagnosis present

## 2022-12-04 DIAGNOSIS — Z8249 Family history of ischemic heart disease and other diseases of the circulatory system: Secondary | ICD-10-CM

## 2022-12-04 DIAGNOSIS — G4733 Obstructive sleep apnea (adult) (pediatric): Secondary | ICD-10-CM | POA: Diagnosis present

## 2022-12-04 DIAGNOSIS — Y832 Surgical operation with anastomosis, bypass or graft as the cause of abnormal reaction of the patient, or of later complication, without mention of misadventure at the time of the procedure: Secondary | ICD-10-CM | POA: Diagnosis present

## 2022-12-04 DIAGNOSIS — Z79899 Other long term (current) drug therapy: Secondary | ICD-10-CM

## 2022-12-04 DIAGNOSIS — Z800 Family history of malignant neoplasm of digestive organs: Secondary | ICD-10-CM | POA: Diagnosis not present

## 2022-12-04 DIAGNOSIS — Z8546 Personal history of malignant neoplasm of prostate: Secondary | ICD-10-CM

## 2022-12-04 DIAGNOSIS — I70221 Atherosclerosis of native arteries of extremities with rest pain, right leg: Principal | ICD-10-CM

## 2022-12-04 DIAGNOSIS — I743 Embolism and thrombosis of arteries of the lower extremities: Secondary | ICD-10-CM | POA: Diagnosis not present

## 2022-12-04 DIAGNOSIS — I252 Old myocardial infarction: Secondary | ICD-10-CM

## 2022-12-04 DIAGNOSIS — Z888 Allergy status to other drugs, medicaments and biological substances status: Secondary | ICD-10-CM | POA: Diagnosis not present

## 2022-12-04 DIAGNOSIS — Z9842 Cataract extraction status, left eye: Secondary | ICD-10-CM

## 2022-12-04 DIAGNOSIS — Z833 Family history of diabetes mellitus: Secondary | ICD-10-CM

## 2022-12-04 DIAGNOSIS — E1151 Type 2 diabetes mellitus with diabetic peripheral angiopathy without gangrene: Secondary | ICD-10-CM | POA: Diagnosis not present

## 2022-12-04 DIAGNOSIS — Z885 Allergy status to narcotic agent status: Secondary | ICD-10-CM | POA: Diagnosis not present

## 2022-12-04 DIAGNOSIS — Z806 Family history of leukemia: Secondary | ICD-10-CM | POA: Diagnosis not present

## 2022-12-04 DIAGNOSIS — Z89422 Acquired absence of other left toe(s): Secondary | ICD-10-CM | POA: Diagnosis not present

## 2022-12-04 DIAGNOSIS — T82858A Stenosis of vascular prosthetic devices, implants and grafts, initial encounter: Principal | ICD-10-CM | POA: Diagnosis present

## 2022-12-04 DIAGNOSIS — Z9889 Other specified postprocedural states: Secondary | ICD-10-CM | POA: Diagnosis not present

## 2022-12-04 DIAGNOSIS — Z7982 Long term (current) use of aspirin: Secondary | ICD-10-CM

## 2022-12-04 DIAGNOSIS — I10 Essential (primary) hypertension: Secondary | ICD-10-CM | POA: Diagnosis present

## 2022-12-04 DIAGNOSIS — Z801 Family history of malignant neoplasm of trachea, bronchus and lung: Secondary | ICD-10-CM

## 2022-12-04 DIAGNOSIS — E785 Hyperlipidemia, unspecified: Secondary | ICD-10-CM | POA: Diagnosis present

## 2022-12-04 DIAGNOSIS — Z91199 Patient's noncompliance with other medical treatment and regimen due to unspecified reason: Secondary | ICD-10-CM

## 2022-12-04 DIAGNOSIS — T82868A Thrombosis of vascular prosthetic devices, implants and grafts, initial encounter: Secondary | ICD-10-CM | POA: Diagnosis not present

## 2022-12-04 DIAGNOSIS — I70392 Other atherosclerosis of unspecified type of bypass graft(s) of the extremities, left leg: Secondary | ICD-10-CM | POA: Diagnosis not present

## 2022-12-04 HISTORY — PX: ABDOMINAL AORTOGRAM W/LOWER EXTREMITY: CATH118223

## 2022-12-04 LAB — FIBRINOGEN
Fibrinogen: 512 mg/dL — ABNORMAL HIGH (ref 210–475)
Fibrinogen: 520 mg/dL — ABNORMAL HIGH (ref 210–475)

## 2022-12-04 LAB — POCT I-STAT, CHEM 8
BUN: 12 mg/dL (ref 8–23)
Calcium, Ion: 1.21 mmol/L (ref 1.15–1.40)
Chloride: 102 mmol/L (ref 98–111)
Creatinine, Ser: 0.9 mg/dL (ref 0.61–1.24)
Glucose, Bld: 128 mg/dL — ABNORMAL HIGH (ref 70–99)
HCT: 41 % (ref 39.0–52.0)
Hemoglobin: 13.9 g/dL (ref 13.0–17.0)
Potassium: 4.2 mmol/L (ref 3.5–5.1)
Sodium: 139 mmol/L (ref 135–145)
TCO2: 23 mmol/L (ref 22–32)

## 2022-12-04 LAB — CBC
HCT: 38.5 % — ABNORMAL LOW (ref 39.0–52.0)
HCT: 39.1 % (ref 39.0–52.0)
Hemoglobin: 12.4 g/dL — ABNORMAL LOW (ref 13.0–17.0)
Hemoglobin: 12.6 g/dL — ABNORMAL LOW (ref 13.0–17.0)
MCH: 27.7 pg (ref 26.0–34.0)
MCH: 27.8 pg (ref 26.0–34.0)
MCHC: 32.2 g/dL (ref 30.0–36.0)
MCHC: 32.2 g/dL (ref 30.0–36.0)
MCV: 86.1 fL (ref 80.0–100.0)
MCV: 86.3 fL (ref 80.0–100.0)
Platelets: 162 10*3/uL (ref 150–400)
Platelets: 160 10*3/uL (ref 150–400)
RBC: 4.47 MIL/uL (ref 4.22–5.81)
RBC: 4.53 MIL/uL (ref 4.22–5.81)
RDW: 13 % (ref 11.5–15.5)
RDW: 13 % (ref 11.5–15.5)
WBC: 6.6 10*3/uL (ref 4.0–10.5)
WBC: 6.8 10*3/uL (ref 4.0–10.5)
nRBC: 0 % (ref 0.0–0.2)
nRBC: 0 % (ref 0.0–0.2)

## 2022-12-04 LAB — HEPARIN LEVEL (UNFRACTIONATED)
Heparin Unfractionated: 0.29 [IU]/mL — ABNORMAL LOW (ref 0.30–0.70)
Heparin Unfractionated: <0.1 [IU]/mL — ABNORMAL LOW (ref 0.30–0.70)

## 2022-12-04 SURGERY — ABDOMINAL AORTOGRAM W/LOWER EXTREMITY
Anesthesia: LOCAL | Laterality: Bilateral

## 2022-12-04 MED ORDER — MORPHINE SULFATE (PF) 2 MG/ML IV SOLN
2.0000 mg | INTRAVENOUS | Status: DC | PRN
  Administered 2022-12-05: 2 mg via INTRAVENOUS
  Filled 2022-12-04: qty 1

## 2022-12-04 MED ORDER — SODIUM CHLORIDE 0.9 % IV SOLN: INTRAVENOUS | Status: DC

## 2022-12-04 MED ORDER — MIDAZOLAM HCL 2 MG/2ML IJ SOLN
INTRAMUSCULAR | Status: DC | PRN
  Administered 2022-12-04: 1 mg via INTRAVENOUS

## 2022-12-04 MED ORDER — FENTANYL CITRATE (PF) 100 MCG/2ML IJ SOLN
INTRAMUSCULAR | Status: AC
  Filled 2022-12-04: qty 2

## 2022-12-04 MED ORDER — IODIXANOL 320 MG/ML IV SOLN
INTRAVENOUS | Status: DC | PRN
  Administered 2022-12-04: 106 mL

## 2022-12-04 MED ORDER — HEPARIN SODIUM (PORCINE) 1000 UNIT/ML IJ SOLN
INTRAMUSCULAR | Status: DC | PRN
  Administered 2022-12-04: 10000 [IU] via INTRAVENOUS

## 2022-12-04 MED ORDER — HEPARIN (PORCINE) 25000 UT/250ML-% IV SOLN
650.0000 [IU]/h | INTRAVENOUS | Status: DC
  Administered 2022-12-04: 400 [IU]/h via INTRAVENOUS
  Filled 2022-12-04: qty 250

## 2022-12-04 MED ORDER — ORAL CARE MOUTH RINSE: 15.0000 mL | OROMUCOSAL | Status: DC | PRN

## 2022-12-04 MED ORDER — LIDOCAINE HCL (PF) 1 % IJ SOLN
INTRAMUSCULAR | Status: DC | PRN
  Administered 2022-12-04 (×2): 15 mL

## 2022-12-04 MED ORDER — FENTANYL CITRATE (PF) 100 MCG/2ML IJ SOLN
INTRAMUSCULAR | Status: DC | PRN
  Administered 2022-12-04: 50 ug via INTRAVENOUS

## 2022-12-04 MED ORDER — GABAPENTIN 300 MG PO CAPS
900.0000 mg | ORAL_CAPSULE | Freq: Three times a day (TID) | ORAL | Status: DC
  Administered 2022-12-04 – 2022-12-06 (×6): 900 mg via ORAL
  Filled 2022-12-04 (×6): qty 3

## 2022-12-04 MED ORDER — SODIUM CHLORIDE 0.9% FLUSH
3.0000 mL | Freq: Two times a day (BID) | INTRAVENOUS | Status: DC
  Administered 2022-12-04 – 2022-12-05 (×3): 3 mL via INTRAVENOUS

## 2022-12-04 MED ORDER — ROSUVASTATIN CALCIUM 20 MG PO TABS
20.0000 mg | ORAL_TABLET | Freq: Every day | ORAL | Status: DC
  Administered 2022-12-04: 20 mg via ORAL
  Filled 2022-12-04: qty 1

## 2022-12-04 MED ORDER — SODIUM CHLORIDE 0.9% FLUSH: 3.0000 mL | INTRAVENOUS | Status: DC | PRN

## 2022-12-04 MED ORDER — LIDOCAINE HCL (PF) 1 % IJ SOLN
INTRAMUSCULAR | Status: AC
  Filled 2022-12-04: qty 30

## 2022-12-04 MED ORDER — MIDAZOLAM HCL 2 MG/2ML IJ SOLN
INTRAMUSCULAR | Status: AC
  Filled 2022-12-04: qty 2

## 2022-12-04 MED ORDER — HEPARIN SODIUM (PORCINE) 1000 UNIT/ML IJ SOLN
INTRAMUSCULAR | Status: AC
  Filled 2022-12-04: qty 10

## 2022-12-04 MED ORDER — ASPIRIN 81 MG PO TBEC
81.0000 mg | DELAYED_RELEASE_TABLET | Freq: Every day | ORAL | Status: DC
  Administered 2022-12-04 – 2022-12-06 (×3): 81 mg via ORAL
  Filled 2022-12-04 (×3): qty 1

## 2022-12-04 MED ORDER — SODIUM CHLORIDE 0.9 % IV SOLN
1.0000 mg/h | INTRAVENOUS | Status: DC
  Administered 2022-12-04 – 2022-12-05 (×2): 1 mg/h
  Filled 2022-12-04 (×6): qty 10

## 2022-12-04 MED ORDER — SODIUM CHLORIDE 0.9 % IV SOLN: 250.0000 mL | INTRAVENOUS | Status: DC | PRN

## 2022-12-04 MED ORDER — ONDANSETRON HCL 4 MG/2ML IJ SOLN
4.0000 mg | Freq: Four times a day (QID) | INTRAMUSCULAR | Status: DC | PRN
  Administered 2022-12-05: 4 mg via INTRAVENOUS
  Filled 2022-12-04: qty 2

## 2022-12-04 MED ORDER — HEPARIN (PORCINE) IN NACL 1000-0.9 UT/500ML-% IV SOLN
INTRAVENOUS | Status: DC | PRN
  Administered 2022-12-04 (×2): 500 mL

## 2022-12-04 SURGICAL SUPPLY — 14 items
CATH ANGIO 5F BER2 65CM (CATHETERS) ×1
CATH INFUS 135CMX50CM (CATHETERS) ×1
CATH OMNI FLUSH 5F 65CM (CATHETERS) ×1
CATH QUICKCROSS .035X135CM (MICROCATHETER) ×1
COVER DOME SNAP 22 D (MISCELLANEOUS) ×1
GLIDEWIRE ADV .035X260CM (WIRE) ×1
KIT MICROPUNCTURE NIT STIFF (SHEATH) ×1
SET ATX-X65L (MISCELLANEOUS) ×1
SHEATH CATAPULT 6FR 45 (SHEATH) ×1
SHEATH PINNACLE 5F 10CM (SHEATH) ×2
SHEATH PROBE COVER 6X72 (BAG) ×2
TRAY PV CATH (CUSTOM PROCEDURE TRAY) ×1
WIRE BENTSON .035X145CM (WIRE) ×1
WIRE G V18X300CM (WIRE) ×1

## 2022-12-04 NOTE — H&P (Signed)
History and Physical Interval Note:  12/04/2022 12:32 PM  [name]  has presented today for surgery, with the diagnosis of pad w/ rest pain.  The various methods of treatment have been discussed with the patient and family. After consideration of risks, benefits and other options for treatment, the patient has consented to  Procedure(s): ABDOMINAL AORTOGRAM W/LOWER EXTREMITY (N/A) as a surgical intervention.  The patient's history has been reviewed, patient examined, no change in status, stable for surgery.  I have reviewed the patient's chart and labs.  Questions were answered to the patient's satisfaction.     [name]   Office Note        CC:  follow up Requesting Provider:  Sonny Masters, FNP   HPI: [name] is a 72 y.o. (Jul 28, 1950) male who presents for follow up of PAD. He has hx of left CFA to AT bypass with non reversed GSV for CLI with tissue loss on 01/29/2022 by Dr. Chestine Spore.  He had left 2nd toe amputation by Dr. Ardelle Anton on 02/04/2022. His left 2nd toe remains healed. He does suffer from diabetic neuropathy. He does have a lot of decreased sensation and coldness in his feet bilaterally. However he explains that over past three month he has had pain in the right greater than left foot at rest. He says he is unable to sit or lay down with out pain. He says at lease one night per week he does not sleep at all and the other nights he may try to go to bed at 10 and will be awake until 2 or earlier in the morning due to pain. He describes it as aching and coldness. He says he cannot get in comfortable position and just tosses and turns and eventually he just has to get up and walk around. He says he really can't describe the feeling in his feet. He says he does not really have any pain on  ambulation but he has some weakness in his legs. The weakness occurs only really on prolonged ambulation or when walking up an incline or stairs. He does not have any tissue  loss.    The pt is on a statin for cholesterol management.    The pt is on an aspirin.  Other AC:  none The pt is on ACEI for hypertension.  The pt does have diabetes. Tobacco hx:  never       Past Medical History:  Diagnosis Date   Allergy      seasonal   Arthritis      " all over me- back, hips, knees, hands, everywhere "   Cancer [facility])      Prostate cancer age 83; Lupron treatments followed by prostatectomy.   Cataract      left removed, forming right   Chronic constipation      x 23 yrs after prostate surgery   Diabetes mellitus without complication (HCC)     Diabetic retinal damage of both eyes (HCC)      gets shots every month   GERD (gastroesophageal reflux disease)     Hyperlipidemia     Hypertension     Myocardial infarction [facility])      age 48; no stenting.   OSA (obstructive sleep apnea)      non-compliant with CPAP.   Sleep apnea      no cpap               Past Surgical History:  Procedure Laterality Date   ABDOMINAL AORTOGRAM W/LOWER EXTREMITY N/A 01/17/2022    Procedure: ABDOMINAL AORTOGRAM W/LOWER EXTREMITY;  Surgeon: Frost Hint, MD;  Location: [facility] INVASIVE CV LAB;  Service: Cardiovascular;  Laterality: N/A;   AMPUTATION TOE Left 02/04/2022    Procedure: AMPUTATION  SECOND TOE;  Surgeon: Vivi Barrack, DPM;  Location: [facility] OR;  Service: Podiatry;  Laterality: Left;   APPENDECTOMY       CHOLECYSTECTOMY       FEMORAL-TIBIAL BYPASS GRAFT Left 01/29/2022    Procedure: LEFT COMMON FEMORAL BYPASS TO ANTERIOR TIBIAL ARTERY;  Surgeon: Clayton Shelling, MD;  Location: [facility] OR;  Service: Vascular;  Laterality: Left;   PROSTATE SURGERY       VEIN HARVEST Left 01/29/2022    Procedure: HARVEST OF LEFT LEG GREAT SAPHENOUS VEIN;  Surgeon: Clayton Shelling, MD;  Location: [facility] OR;  Service: Vascular;  Laterality: Left;          Social History         Socioeconomic History   Marital status: Single      Spouse name: GF-Betty   Number of  children: 2   Years of education: 11   Highest education level: Not on file  Occupational History   Occupation: supervisor      Employer: [employer]   Tobacco Use   Smoking status: Never      Passive exposure: Never   Smokeless tobacco: Former      Types: Engineer, drilling   Vaping status: Never Used  Substance and Sexual Activity   Alcohol use: Yes      Comment: social   Drug use: Not Currently   Sexual activity: Not Currently  Other Topics Concern   Not on file  Social History Narrative    Marital status: divorced, girlfriend x 1977      Children: 1       Tobacco: none; chew tobacco       Alcohol: beers on weekends       Exercise: none    Social Determinants of Health        Financial Resource Strain: Low Risk  (11/25/2020)    Overall Financial Resource Strain (CARDIA)     Difficulty of Paying Living Expenses: Not hard at all  Food Insecurity: Food Insecurity Present (02/06/2022)    Hunger Vital Sign     Worried About Running Out of Food in the Last Year: Sometimes true     Ran Out of Food in the Last Year: Sometimes true  Transportation Needs: No Transportation Needs (02/12/2022)    PRAPARE - Therapist, art (Medical): No     Lack of Transportation (Non-Medical): No  Physical Activity: Insufficiently Active (11/25/2020)    Exercise Vital Sign     Days of Exercise per Week: 7 days     Minutes of Exercise per Session: 10 min  Stress: Stress Concern Present (11/25/2020)    [employer] of Occupational Health - Occupational Stress Questionnaire     Feeling of Stress : To some extent  Social Connections: Moderately Isolated (11/25/2020)    Social Connection and Isolation Panel [NHANES]     Frequency of Communication with Friends and Family: More than three times a week     Frequency of Social Gatherings with Friends and Family: More than three times a week     Attends Religious Services: More than 4 times per year     Active Member  of Clubs or Organizations: No     Attends Banker Meetings: Never     Marital Status: Divorced  Catering manager Violence: Not At Risk (02/06/2022)    Humiliation, Afraid, Rape, and Kick questionnaire     Fear of Current or Ex-Partner: No     Emotionally Abused: No     Physically Abused: No     Sexually Abused: No           Family History  Problem Relation Age of Onset   Cancer Mother          leukemia   Diabetes Mother     Cancer Father 40        lung cancer with mets   Colon cancer Maternal Grandmother     Heart disease Maternal Grandmother     Heart disease Maternal Grandfather     Heart disease Paternal Grandmother     Cancer Paternal Grandfather          lung, prostate   Esophageal cancer Neg Hx     Colon polyps Neg Hx     Stomach cancer Neg Hx     Rectal cancer Neg Hx                  Current Outpatient Medications  Medication Sig Dispense Refill   aspirin 81 MG tablet Take 81 mg by mouth daily.       BAYER CONTOUR NEXT TEST test strip CHECK BLOOD SUGAR TWICE DAILY 100 each 5   BAYER MICROLET LANCETS lancets Check blood sugar two times daily. 100 each 5   Blood Glucose Monitoring Suppl (BAYER CONTOUR NEXT MONITOR) w/Device KIT Check blood sugar two times daily. 1 kit 2   ciprofloxacin (CILOXAN) 0.3 % ophthalmic solution Place 1 drop into both eyes See admin instructions. NSTILL ONE DROP INTO BOTH EYES 4 TIMES A DAY FOR TWO DAYS AFTER EACH MONTHLY EYE INJECTION       dapagliflozin propanediol (FARXIGA) 5 MG TABS tablet Take 1 tablet (5 mg total) by mouth daily before breakfast. 30 tablet 6   fluticasone (FLONASE) 50 MCG/ACT nasal spray Place 1 spray into both nostrils daily as needed for allergies or rhinitis.       gabapentin (NEURONTIN) 300 MG capsule Take 3 capsules (900 mg total) by mouth 3 (three) times daily. 810 capsule 0   lisinopril (ZESTRIL) 5 MG tablet Take 1 tablet (5 mg total) by mouth daily. 90 tablet 3   metFORMIN (GLUCOPHAGE-XR) 500 MG 24  hr tablet Take 2 tablets (1,000 mg total) by mouth daily with breakfast AND 2 tablets (1,000 mg total) every evening. 4 tablets daily. 360 tablet 1   pantoprazole (PROTONIX) 40 MG tablet TAKE 1 TABLET (40 MG TOTAL) BY MOUTH TWICE A DAY BEFORE MEALS 180 tablet 0   rosuvastatin (CRESTOR) 20 MG tablet Take 1 tablet (20 mg total) by mouth daily. 30 tablet 11      No current facility-administered medications for this visit.        Allergies      Allergies  Allergen Reactions   Cymbalta [Duloxetine Hcl] Nausea And Vomiting   Darvocet [Propoxyphene N-Acetaminophen] Hives          REVIEW OF SYSTEMS:    [X]  denotes positive finding, [ ]  denotes negative finding Cardiac   Comments:  Chest pain or chest pressure:      Shortness of breath upon exertion:      Short of breath when lying flat:  Irregular heart rhythm:             Vascular      Pain in calf, thigh, or hip brought on by ambulation:      Pain in feet at night that wakes you up from your sleep:       Blood clot in your veins:      Leg swelling:              Pulmonary      Oxygen at home:      Productive cough:       Wheezing:              Neurologic      Sudden weakness in arms or legs:       Sudden numbness in arms or legs:       Sudden onset of difficulty speaking or slurred speech:      Temporary loss of vision in one eye:       Problems with dizziness:              Gastrointestinal      Blood in stool:       Vomited blood:              Genitourinary      Burning when urinating:       Blood in urine:             Psychiatric      Major depression:              Hematologic      Bleeding problems:      Problems with blood clotting too easily:             Skin      Rashes or ulcers:             Constitutional      Fever or chills:          PHYSICAL EXAMINATION:      Vitals:    11/04/22 0943  BP: 116/76  Pulse: 62  Resp: 18  Temp: 97.9 F (36.6 C)  TempSrc: Temporal  SpO2: 96%  Weight:  218 lb 14.4 oz (99.3 kg)  Height: 5' 10.5" (1.791 m)      General:  WDWN in NAD; vital signs documented above Gait: Normal HENT: WNL, normocephalic Pulmonary: normal non-labored breathing , without wheezing Cardiac: regular HR Abdomen: soft, NT, no masses Vascular Exam/Pulses: 2+ femoral pulses, palpable pulse in left lower extremity bypass graft, 2+ left DP pulse. No palpable pulses in right foot. Toes cool but feet warm bilaterally  Extremities: without ischemic changes, without Gangrene , without cellulitis; without open wounds;  Musculoskeletal: no muscle wasting or atrophy       Neurologic: A&O X 3 Psychiatric:  The pt has Normal affect.     Non-Invasive Vascular Imaging:   Left Graft #1: CFA-ATA  +--------------------+--------+---------------+--------+--------+                     PSV cm/sStenosis       WaveformComments  +--------------------+--------+---------------+--------+--------+  Inflow             103                    biphasic          +--------------------+--------+---------------+--------+--------+  Proximal Anastomosis210     50-70% stenosisbiphasic          +--------------------+--------+---------------+--------+--------+  Proximal Graft  83                     biphasic          +--------------------+--------+---------------+--------+--------+  Mid Graft           89                     biphasic          +--------------------+--------+---------------+--------+--------+  Distal Graft        42                     biphasic          +--------------------+--------+---------------+--------+--------+  Distal Anastomosis  286     50-70% stenosisbiphasic          +--------------------+--------+---------------+--------+--------+  Outflow            92                     biphasic          +--------------------+--------+---------------+--------+--------+   Summary:  Left: Patent bypass graft with velocities suggestive  of a 50-70% stenosis involving the proximal and distal anastomoses.    +-------+-----------+-----------+------------+------------+  ABI/TBIToday's ABIToday's TBIPrevious ABIPrevious TBI  +-------+-----------+-----------+------------+------------+  Right [place]         0.44       [place]          0.39          +-------+-----------+-----------+------------+------------+  Left  0.96       0.61       0.95        0.95          +-------+-----------+-----------+------------+------------+      ASSESSMENT/PLAN:: 72 y.o. male here for follow up for PAD. He has hx of left CFA to AT bypass with non reversed GSV for CLI with tissue loss on 01/29/2022 by Dr. Chestine Spore.  He had left 2nd toe amputation by Dr. Ardelle Anton on 02/04/2022. His left 2nd toe remains healed. He does suffer from diabetic neuropathy. However he explains that over past three month he has had pain in the right greater than left foot at rest. This is keeping him awake at night. He has some claudication but this only really occurs on prolonged ambulation or when walking up an incline or stairs. He does not have any tissue loss.  - ABI today is overall stable. Slight decrease in left TBI - Duplex shows elevated velocities in the LLE bypass graft at the proximal and distal anastomosis. These velocities have increased since prior duplex - Continue Aspirin and Statin  - encourage walking regimen to help promote collaterals  - He has atherosclerosis of the native arteries of the Bilateral lower extremities causing ischemic rest pain in the right leg. The patient is on best medical therapy for peripheral arterial disease. The patient has been counseled about the risks of tobacco use in atherosclerotic disease. The patient has been counseled to abstain from any tobacco use. An aortogram with bilateral lower extremity runoff angiography and Bilateral lower extremity intervention and is indicated to better evaluate the patient's lower extremity  circulation because of the limb threatening nature of the patient's diagnosis. Based on the patient's clinical exam and non-invasive data, we anticipate an endovascular intervention in the tibial vessels of the right lower extremity. Duplex today also shows elevated velocities in the proximal and distal anastomosis of the LLE bypass graft with concerns of threatened bypass. Will plan  for intervention of the right lower extremity with bilateral runoff to evaluated the LLE bypass as well. - Will arrange this in the near future with Dr. Loistine Chance, PA-C [facility] [phone]   Clinic MD:   Steve Rattler

## 2022-12-04 NOTE — Op Note (Signed)
Patient name: [name] MRN: [id number] DOB: [birth year] Sex: male  12/04/2022 Pre-operative Diagnosis:  1.  Right leg PAD with foot pain Post-operative diagnosis:   1.  Right leg PAD with foot pain 2.  Occluded left common femoral to anterior tibial artery vein bypass Surgeon:  Cephus Shelling, MD Procedure Performed: 1.  Ultrasound-guided access left common femoral artery 2.  Aortogram with catheter selection of aorta 3.  Bilateral lower extremity arteriogram with runoff from catheter in the aorta 4.  Ultrasound-guided access right common femoral artery 5.  Selection of left common femoral to anterior tibial artery bypass with hand-injection 6.  Initiation of thrombolysis of occluded left common femoral to anterior tibial bypass (UniFuse catheter with tPA) 7.  60 minutes of monitored moderate conscious sedation time  Indications: Patient is a 72 year old male that presents for evaluation of right leg PAD in the setting of leg pain after evaluation by our PA.  Has history of previous left common femoral to anterior tibial bypass for CLI with tissue loss.  He presents after risk benefits discussed.  Findings:   Ultrasound-guided access left common femoral artery with initial focus on the right leg.  This showed that the infrarenal aorta was patent.  Both renal arteries were patent.  Both iliac arteries were patent.  On the right he had a patent common femoral, profunda, SFA, popliteal artery with tibial disease.  The PT was the dominant runoff that had a high takeoff.  There was a moderate 50-70%% stenosis in the proximal vessel with a high-grade stenosis >80% in the mid vessel.  Left leg imaging from the catheter in the aorta showed that his left common femoral to anterior tibial artery bypass was occluded.  We saw filling of the left common femoral, and the native SFA and profunda as well as the native popliteal artery.  The tibial trifurcation occludes.  My initial concern  was the noted occlusion of the left leg bypass.  This appears to be recent occlusion as he just had a duplex 3-4 weeks ago and it was patent.  I then elected to access the right common femoral artery and I went down and cannulated the left leg bypass from right transfemoral access.  Indeed the bypass was occluded with no flow distally and there is significant anterior tibial disease beyond the bypass in the native outflow.  We initiated thrombolysis after placing a UniFuse catheter in the occluded left leg bypass.  Will initiate tPA 1 mg an hour with heparin at 400 units an hour through each common femoral sheath for a total of 800 units an hour.   Procedure:  The patient was identified in the holding area and taken to room 8.  The patient was then placed supine on the table and prepped and draped in the usual sterile fashion.  A time out was called.  Ultrasound was used to evaluate the left common femoral artery.  It was patent .  A digital ultrasound image was acquired.  A micropuncture needle was used to access the left common femoral artery under ultrasound guidance.  An 018 wire was advanced without resistance and a micropuncture sheath was placed.  The 018 wire was removed and a benson wire was placed.  The micropuncture sheath was exchanged for a 5 french sheath.  An omniflush catheter was advanced over the wire to the level of L-1.  An abdominal angiogram was obtained.  I then got bilateral lower extremity runoff from our catheter  in the aorta.  Pertinent findings are noted above.  Initial focus was on the right leg where he has significant tibial disease as noted.  What was more concerning was that we noticed the left common femoral to anterior tibial bypass patent on recent duplex in our office last month was occluded with no filling.  I did take another hand-injection from the left femoral sheath and again saw no filling of the left leg bypass.  This is a recent acute to subacute occlusion and the  patient states on further questioning he is having some increased symptoms in the left leg.  I then evaluated the right common femoral artery with ultrasound, it was patent and image was saved.  This was accessed with a micro access needle, placed a microwire, and a microsheath.  I then upsized with a Bentson wire to a 5 [place] sheath and got a Omni Flush catheter into the aorta and crossed the aortic bifurcation and used a Glidewire advantage and then upsized to a 6 [place] catapult sheath in the right groin over the aortic bifurcation into the left iliac.  Patient was given 100 units/kg IV heparin.  I then was able to use a BER 2 catheter with a Glidewire advantage to cannulate the occluded left common femoral to anterior tibial bypass.  I did several hand injections to confirm there was no distal flow in the bypass with acute thrombus.  It was 100% occluded.  I then used a V18 wire and I was able to get down through the distal anastomosis and into the anterior tibial all the way into the dorsalis pedis of the foot.  The anterior tibial is diffusely diseased distally with segments of chronic total occlusion.  He has severely compromised outflow.  I then used a UniFuse catheter that was placed through the right femoral sheath over the aortic bifurcation down the occluded left leg bypass and placed the inner cannula and the Unifuse is 50 cm infusion length.  Will initiate tPA at 1 mg an hour.  Will initiate heparin at 400 units/hr through each sheath.  This was secured.  Will be taken to the ICU.  I elected not to remove the left femoral sheath as this would put him at high risk for femoral bleed tonight.  Plan: Patient will go to the ICU for thrombolysis of occluded left leg bypass.  Will return tomorrow.  TPA 1 mg an hour through the UniFuse catheter in the right femoral sheath that is over the aortic bifurcation into the occluded left leg bypass.  Will put heparin at 400 units an hour through each femoral sheath  for 800 units an hour total.   Cephus Shelling, MD [facility]: [phone]

## 2022-12-04 NOTE — Progress Notes (Signed)
PHARMACY - ANTICOAGULATION CONSULT NOTE  Pharmacy Consult for Heparin Indication: limb ischemia  Allergies  Allergen Reactions   Cymbalta [Duloxetine Hcl] Nausea And Vomiting   Darvocet [Propoxyphene N-Acetaminophen] Hives    Patient Measurements: Height: 5' 10.5" (179.1 cm) Weight: 96.2 kg (212 lb) IBW/kg (Calculated) : 74.15 Heparin Dosing Weight: 93.7 kg  Vital Signs: Temp: 98.3 F (36.8 C) (10/03 0959) Temp Source: Oral (10/03 0959) BP: 126/70 (10/03 1630) Pulse Rate: 54 (10/03 1630)  Labs: Recent Labs    12/04/22 1003  HGB 13.9  HCT 41.0  CREATININE 0.90    Estimated Creatinine Clearance: 90.9 mL/min (by C-G formula based on SCr of 0.9 mg/dL).   Medical History: Past Medical History:  Diagnosis Date   Allergy    seasonal   Arthritis    " all over me- back, hips, knees, hands, everywhere "   Cancer [facility])    Prostate cancer age 27; Lupron treatments followed by prostatectomy.   Cataract    left removed, forming right   Chronic constipation    x 23 yrs after prostate surgery   Diabetes mellitus without complication (HCC)    Diabetic retinal damage of both eyes (HCC)    gets shots every month   GERD (gastroesophageal reflux disease)    Hyperlipidemia    Hypertension    Myocardial infarction [facility])    age 35; no stenting.   OSA (obstructive sleep apnea)    non-compliant with CPAP.   Sleep apnea    no cpap    Medications:  Scheduled:   aspirin  81 mg Oral Daily   gabapentin  900 mg Oral TID   rosuvastatin  20 mg Oral Daily   sodium chloride flush  3 mL Intravenous Q12H   Infusions:   sodium chloride     sodium chloride 75 mL/hr at 12/04/22 1625   alteplase (LIMB ISCHEMIA) 10 mg in normal saline (0.02 mg/mL) infusion 1 mg/hr (12/04/22 1549)   heparin 400 Units/hr (12/04/22 1607)   heparin 400 Units/hr (12/04/22 1606)   PRN: sodium chloride, morphine injection, ondansetron (ZOFRAN) IV, sodium chloride flush  Assessment: 72 yo male with PAD  s/p thrombolysis of occluded left common femoral to anterior tibial artery vein bypass. Pharmacy consulted to dose IV heparin.   Goal of Therapy:  Heparin level 0.2-0.5 units/ml Monitor platelets by anticoagulation protocol: Yes   Plan:  Continue IV heparin 400 units/hr as started in cath lab ~1600 Heparin levels ordered q6h - will adjust rate based on next steady state level Daily CBC and heparin level Alteplase continues at 1mg /kg via catheter  Loralee Pacas, PharmD, BCPS 12/04/2022,5:15 PM  Please check AMION for all [facility] Pharmacy phone numbers After 10:00 PM, call Main Pharmacy [phone]

## 2022-12-04 NOTE — Progress Notes (Addendum)
PHARMACY - ANTICOAGULATION Pharmacy Consult for Heparin Indication: limb ischemia Brief A/P: Heparin level subtherapeutic Increase Heparin rate  Allergies  Allergen Reactions   Cymbalta [Duloxetine Hcl] Nausea And Vomiting   Darvocet [Propoxyphene N-Acetaminophen] Hives    Patient Measurements: Height: 5' 10.5" (179.1 cm) Weight: 96.2 kg (212 lb) IBW/kg (Calculated) : 74.15 Heparin Dosing Weight: 93.7 kg  Vital Signs: Temp: 97.9 F (36.6 C) (10/03 2300) Temp Source: Axillary (10/03 2300) BP: 127/64 (10/03 2200) Pulse Rate: 67 (10/03 2200)  Labs: Recent Labs    12/04/22 1003 12/04/22 1739 12/04/22 2249  HGB 13.9 12.4* 12.6*  HCT 41.0 38.5* 39.1  PLT  --  162 160  HEPARINUNFRC  --  0.29* <0.10*  CREATININE 0.90  --   --     Estimated Creatinine Clearance: 90.9 mL/min (by C-G formula based on SCr of 0.9 mg/dL).  Assessment: 72 yo male with PAD undergoing thrombolysis of LLE bypass graft,  for heparin.  Currently infusing heparin 400 units/hr through bilateral femoral sheaths as well as tPA 1 mg/hr  Goal of Therapy:  Heparin level 0.2-0.5 units/ml Monitor platelets by anticoagulation protocol: Yes   Plan:  Increase Heparin 1100 units/hr (550 units/hr through each sheath) Check heparin level in 8 hours.  Geannie Risen, PharmD, BCPS

## 2022-12-05 ENCOUNTER — Encounter (HOSPITAL_COMMUNITY): Payer: Self-pay | Admitting: Vascular Surgery

## 2022-12-05 ENCOUNTER — Other Ambulatory Visit (HOSPITAL_COMMUNITY): Payer: Self-pay

## 2022-12-05 ENCOUNTER — Encounter (HOSPITAL_COMMUNITY)
Admission: RE | Disposition: A | Discharge status: Home / Self Care | Payer: Self-pay | Source: Home / Self Care | Attending: Vascular Surgery

## 2022-12-05 DIAGNOSIS — T82868A Thrombosis of vascular prosthetic devices, implants and grafts, initial encounter: Secondary | ICD-10-CM | POA: Diagnosis not present

## 2022-12-05 DIAGNOSIS — I70392 Other atherosclerosis of unspecified type of bypass graft(s) of the extremities, left leg: Secondary | ICD-10-CM

## 2022-12-05 DIAGNOSIS — Z9889 Other specified postprocedural states: Secondary | ICD-10-CM

## 2022-12-05 HISTORY — PX: PERIPHERAL VASCULAR THROMBECTOMY: CATH118306

## 2022-12-05 HISTORY — PX: PERIPHERAL VASCULAR INTERVENTION: CATH118257

## 2022-12-05 LAB — CBC
HCT: 36.1 % — ABNORMAL LOW (ref 39.0–52.0)
HCT: 35.9 % — ABNORMAL LOW (ref 39.0–52.0)
Hemoglobin: 11.7 g/dL — ABNORMAL LOW (ref 13.0–17.0)
Hemoglobin: 11.6 g/dL — ABNORMAL LOW (ref 13.0–17.0)
MCH: 27.9 pg (ref 26.0–34.0)
MCH: 27.9 pg (ref 26.0–34.0)
MCHC: 32.4 g/dL (ref 30.0–36.0)
MCHC: 32.3 g/dL (ref 30.0–36.0)
MCV: 86.2 fL (ref 80.0–100.0)
MCV: 86.3 fL (ref 80.0–100.0)
Platelets: 144 10*3/uL — ABNORMAL LOW (ref 150–400)
Platelets: 150 10*3/uL (ref 150–400)
RBC: 4.19 MIL/uL — ABNORMAL LOW (ref 4.22–5.81)
RBC: 4.16 MIL/uL — ABNORMAL LOW (ref 4.22–5.81)
RDW: 13 % (ref 11.5–15.5)
RDW: 13 % (ref 11.5–15.5)
WBC: 7.9 10*3/uL (ref 4.0–10.5)
WBC: 7 10*3/uL (ref 4.0–10.5)
nRBC: 0 % (ref 0.0–0.2)
nRBC: 0 % (ref 0.0–0.2)

## 2022-12-05 LAB — BASIC METABOLIC PANEL
Anion gap: 9 (ref 5–15)
BUN: 12 mg/dL (ref 8–23)
CO2: 24 mmol/L (ref 22–32)
Calcium: 8.1 mg/dL — ABNORMAL LOW (ref 8.9–10.3)
Chloride: 104 mmol/L (ref 98–111)
Creatinine, Ser: 0.98 mg/dL (ref 0.61–1.24)
GFR, Estimated: >60 mL/min (ref >60)
Glucose, Bld: 117 mg/dL — ABNORMAL HIGH (ref 70–99)
Potassium: 4 mmol/L (ref 3.5–5.1)
Sodium: 137 mmol/L (ref 135–145)

## 2022-12-05 LAB — FIBRINOGEN
Fibrinogen: 551 mg/dL — ABNORMAL HIGH (ref 210–475)
Fibrinogen: 527 mg/dL — ABNORMAL HIGH (ref 210–475)

## 2022-12-05 LAB — GLUCOSE, CAPILLARY: Glucose-Capillary: 96 mg/dL (ref 70–99)

## 2022-12-05 LAB — POCT ACTIVATED CLOTTING TIME
Activated Clotting Time: 202 s
Activated Clotting Time: 201 s
Activated Clotting Time: 159 s

## 2022-12-05 LAB — HEPARIN LEVEL (UNFRACTIONATED)
Heparin Unfractionated: <0.1 [IU]/mL — ABNORMAL LOW (ref 0.30–0.70)
Heparin Unfractionated: <0.1 [IU]/mL — ABNORMAL LOW (ref 0.30–0.70)

## 2022-12-05 SURGERY — PERIPHERAL VASCULAR THROMBECTOMY
Anesthesia: LOCAL

## 2022-12-05 MED ORDER — HEPARIN (PORCINE) IN NACL 1000-0.9 UT/500ML-% IV SOLN
INTRAVENOUS | Status: DC | PRN
  Administered 2022-12-05 (×2): 500 mL

## 2022-12-05 MED ORDER — SODIUM CHLORIDE 0.9 % WEIGHT BASED INFUSION
1.0000 mL/kg/h | INTRAVENOUS | Status: AC
  Administered 2022-12-05: 1 mL/kg/h via INTRAVENOUS

## 2022-12-05 MED ORDER — SODIUM CHLORIDE 0.9% FLUSH: 3.0000 mL | Freq: Two times a day (BID) | INTRAVENOUS | Status: DC

## 2022-12-05 MED ORDER — HEPARIN SODIUM (PORCINE) 1000 UNIT/ML IJ SOLN
INTRAMUSCULAR | Status: AC
  Filled 2022-12-05: qty 10

## 2022-12-05 MED ORDER — IODIXANOL 320 MG/ML IV SOLN
INTRAVENOUS | Status: DC | PRN
  Administered 2022-12-05: 85 mL via INTRA_ARTERIAL

## 2022-12-05 MED ORDER — ONDANSETRON HCL 4 MG/2ML IJ SOLN: 4.0000 mg | Freq: Four times a day (QID) | INTRAMUSCULAR | Status: DC | PRN

## 2022-12-05 MED ORDER — CHLORHEXIDINE GLUCONATE CLOTH 2 % EX PADS
6.0000 | MEDICATED_PAD | Freq: Every day | CUTANEOUS | Status: DC
  Administered 2022-12-05 (×2): 6 via TOPICAL

## 2022-12-05 MED ORDER — SODIUM CHLORIDE 0.9% FLUSH: 3.0000 mL | INTRAVENOUS | Status: DC | PRN

## 2022-12-05 MED ORDER — LIDOCAINE HCL (PF) 1 % IJ SOLN
INTRAMUSCULAR | Status: DC | PRN
  Administered 2022-12-05: 5 mL

## 2022-12-05 MED ORDER — ACETAMINOPHEN 325 MG PO TABS: 650.0000 mg | ORAL_TABLET | ORAL | Status: DC | PRN

## 2022-12-05 MED ORDER — HEPARIN (PORCINE) 25000 UT/250ML-% IV SOLN
1500.0000 [IU]/h | INTRAVENOUS | Status: DC
  Administered 2022-12-05: 1300 [IU]/h via INTRAVENOUS

## 2022-12-05 MED ORDER — SODIUM CHLORIDE 0.9 % IV SOLN: 250.0000 mL | INTRAVENOUS | Status: DC | PRN

## 2022-12-05 MED ORDER — HEPARIN SODIUM (PORCINE) 1000 UNIT/ML IJ SOLN
INTRAMUSCULAR | Status: DC | PRN
  Administered 2022-12-05: 3000 [IU] via INTRAVENOUS
  Administered 2022-12-05: 5000 [IU] via INTRAVENOUS

## 2022-12-05 MED ORDER — LIDOCAINE HCL (PF) 1 % IJ SOLN
INTRAMUSCULAR | Status: AC
  Filled 2022-12-05: qty 30

## 2022-12-05 MED ORDER — LABETALOL HCL 5 MG/ML IV SOLN: 10.0000 mg | INTRAVENOUS | Status: DC | PRN

## 2022-12-05 MED ORDER — ROSUVASTATIN CALCIUM 20 MG PO TABS
40.0000 mg | ORAL_TABLET | Freq: Every day | ORAL | Status: DC
  Administered 2022-12-05 – 2022-12-06 (×2): 40 mg via ORAL
  Filled 2022-12-05 (×2): qty 2

## 2022-12-05 MED ORDER — HYDRALAZINE HCL 20 MG/ML IJ SOLN: 5.0000 mg | INTRAMUSCULAR | Status: DC | PRN

## 2022-12-05 SURGICAL SUPPLY — 10 items
BALLN STERLING OTW 3X150X150 (BALLOONS) ×2
BALLN STERLING OTW 4X150X150 (BALLOONS) ×2
BALLN STERLING OTW 5X40X135 (BALLOONS) ×2
CATH QUICKCROSS .018X135CM (MICROCATHETER) ×2
CLOSURE PERCLOSE PROSTYLE (VASCULAR PRODUCTS) ×2
KIT ENCORE 26 ADVANTAGE (KITS) ×2
SET ATX-X65L (MISCELLANEOUS) ×2
TRAY PV CATH (CUSTOM PROCEDURE TRAY) ×2
WIRE BENTSON .035X145CM (WIRE) ×2
WIRE G V18X300CM (WIRE) ×2

## 2022-12-05 NOTE — Progress Notes (Signed)
PHARMACY - ANTICOAGULATION Pharmacy Consult for Heparin Indication: limb ischemia   Allergies  Allergen Reactions   Cymbalta [Duloxetine Hcl] Nausea And Vomiting   Darvocet [Propoxyphene N-Acetaminophen] Hives    Patient Measurements: Height: 5' 10.5" (179.1 cm) Weight: 98.6 kg (217 lb 6 oz) IBW/kg (Calculated) : 74.15 Heparin Dosing Weight: 93.7 kg  Vital Signs: Temp: 98 F (36.7 C) (10/04 1053) Temp Source: Oral (10/04 1053) BP: 125/59 (10/04 1100) Pulse Rate: 69 (10/04 1100)  Labs: Recent Labs    12/04/22 1003 12/04/22 1739 12/04/22 2249 12/05/22 0956  HGB 13.9 12.4* 12.6* 11.7*  HCT 41.0 38.5* 39.1 36.1*  PLT  --  162 160 144*  HEPARINUNFRC  --  0.29* <0.10* <0.10*  CREATININE 0.90  --   --  0.98    Estimated Creatinine Clearance: 84.5 mL/min (by C-G formula based on SCr of 0.98 mg/dL).  Assessment: 72 yo male with PAD undergoing thrombolysis of LLE bypass graft,  for heparin.  Currently infusing heparin 400 units/hr through bilateral femoral sheaths as well as tPA 1 mg/hr  -heparin level < 0.1, hg= 11.7, plt= 144, fibrinogen 551  Goal of Therapy:  Heparin level 0.2-0.5 units/ml Monitor platelets by anticoagulation protocol: Yes   Plan:  Increase Heparin 1300 units/hr (550 units/hr through each sheath) Will follow plans after lysis recheck today  Harland German, PharmD Clinical Pharmacist **Pharmacist phone directory can now be found on [email or web address] ([email or web address]).  Listed under [facility].

## 2022-12-05 NOTE — Progress Notes (Signed)
Site area: L groin Site Prior to Removal:  Level 0 Pressure Applied For: 25 min Manual:   yes Patient Status During Pull:  stable Post Pull Site:  Level 0 Post Pull Instructions Given:  yes Post Pull Pulses Present: DP 1+ bilaterally Dressing Applied:  gauze & tegaderm Bedrest begins @ 1540 (4 hours ordered) Comments:

## 2022-12-05 NOTE — Progress Notes (Signed)
Patient and family requesting update from provider related to surgery. Please call Brigid Re (granddaughter) at [phone].   Cheyne Boulden Berneta Levins, RN

## 2022-12-05 NOTE — TOC Benefit Eligibility Note (Signed)
Patient Product/process development scientist completed.    The patient is insured through [employer]. Patient has Medicare and is not eligible for a copay card, but may be able to apply for patient assistance, if available.    Ran test claim for Eliquis 5 mg and the current 30 day co-pay is $45.00.  Ran test claim for Xarelto 20 mg and the current 30 day co-pay is $45.00.  This test claim was processed through [facility]- copay amounts may vary at other pharmacies due to pharmacy/plan contracts, or as the patient moves through the different stages of their insurance plan.     Roland Earl, CPHT Pharmacy Technician III Certified Patient Advocate [facility] Patient Advocate Team Direct Number: [phone]  Fax: [phone]

## 2022-12-05 NOTE — Progress Notes (Signed)
PHARMACY - ANTICOAGULATION Pharmacy Consult for Heparin Indication: limb ischemia   Allergies  Allergen Reactions   Cymbalta [Duloxetine Hcl] Nausea And Vomiting   Darvocet [Propoxyphene N-Acetaminophen] Hives    Patient Measurements: Height: 5' 10.5" (179.1 cm) Weight: 98.6 kg (217 lb 6 oz) IBW/kg (Calculated) : 74.15 Heparin Dosing Weight: 93.7 kg  Vital Signs: Temp: 98.1 F (36.7 C) (10/04 1655) Temp Source: Oral (10/04 1655) BP: 112/69 (10/04 1715) Pulse Rate: 72 (10/04 1715)  Labs: Recent Labs    12/04/22 1003 12/04/22 1739 12/04/22 2249 12/05/22 0956  HGB 13.9 12.4* 12.6* 11.7*  HCT 41.0 38.5* 39.1 36.1*  PLT  --  162 160 144*  HEPARINUNFRC  --  0.29* <0.10* <0.10*  CREATININE 0.90  --   --  0.98    Estimated Creatinine Clearance: 84.5 mL/min (by C-G formula based on SCr of 0.98 mg/dL).  Assessment: 72 yo male with PAD undergoing thrombolysis of LLE bypass graft,  for heparin.  Currently infusing heparin 400 units/hr through bilateral femoral sheaths as well as tPA 1 mg/hr  Pt is s/p angiogram and angioplasty. No longer on tPA infusion. Resume heparin 6 hrs post sheath removal.   Goal of Therapy:  Heparin level: 0.3-0.7 Monitor platelets by anticoagulation protocol: Yes   Plan:  Resume heparin at 1300 units/hr at 10PM Check 6 hr HL Daily HL and CBC F/u with oral [facility]  Ulyses Southward, PharmD, BCIDP, AAHIVP, CPP Infectious Disease Pharmacist 12/05/2022 6:21 PM

## 2022-12-05 NOTE — Op Note (Signed)
DATE OF SERVICE: 12/05/2022  PATIENT:  [name]  72 y.o. male  PRE-OPERATIVE DIAGNOSIS:  thrombosed left common femoral to anterior tibial artery bypass with ongoing catheter directed thrombolysis  POST-OPERATIVE DIAGNOSIS:  Same  PROCEDURE:   1) Left lower extremity angiogram with third order cannulation  2) Left dorsalis pedis angioplasty (3x184mm Sterling) 3) Left anterior tibial angioplasty (3x19mm Sterling) 4) Left common femoral - anterior tibial bypass angioplasty (4x149mm Sterling)  SURGEON:  Rande Brunt. Lenell Antu, MD  ASSISTANT: none  ANESTHESIA:   local and IV sedation  ESTIMATED BLOOD LOSS: minimal  LOCAL MEDICATIONS USED:  LIDOCAINE   COUNTS: confirmed correct.  PATIENT DISPOSITION:  PACU - hemodynamically stable.   Delay start of Pharmacological VTE agent (>24hrs) due to surgical blood loss or risk of bleeding: no  INDICATION FOR PROCEDURE: [name] is a 72 y.o. male with ongoing catheter directed thrombolysis of a thrombosed left common femoral to anterior tibial artery bypass. After careful discussion of risks, benefits, and alternatives the patient was offered catheter recheck and endovascular intervention. The patient understood and wished to proceed.  OPERATIVE FINDINGS:  Initially, the bypass appeared thrombosed. I directed a catheter into the anterior tibial artery and noted the anterior tibial artery to be patent along with the distal bypass. I sequentially performed angioplasty of the dorsalis pedis, anterior tibial artery, and the entire length of the bypass which appeared severely stenotic in several areas. Ultimately good technical result was achieved with patency of the bypass without significant stenosis into the anterior tibial artery without flow into the foot  DESCRIPTION OF PROCEDURE: After identification of the patient in the pre-operative holding area, the patient was transferred to the operating room. The patient was positioned  supine on the operating room table. Anesthesia was induced. The groins was prepped and draped in standard fashion. A surgical pause was performed confirming correct patient, procedure, and operative location.  The existing thrombolysis catheter was removed over a V18 wire.  Angiography was performed in stations.  Initially I thought the bypass was thrombosed.  A catheter directed angiogram in the distal bypass showed the anterior tibial artery was severely diseased as well as the dorsalis pedis artery.  There was some flow in the distal bypass.  I navigated the wire into the dorsalis pedis artery and performed angioplasty cranially and sequentially.  After performing angioplasty of the segment, I performed a catheter directed angiogram which showed improvement.  This was performed sequentially up the entire length of the bypass until the proximal anastomosis.  At this point an additional angioplasty of a 5 x 40 mm balloon in 1 area of resistant stenosis was performed.  This resolved the stenosis of the bypass graft.  Would previously appear to be competitive flow down the superficial femoral artery improved and flow was preferential into the bypass.  Satisfied we ended the case here.  All endovascular equipment was removed.  The right common femoral arteriotomy was closed with a Perclose device in standard technique.  Good hemostasis was achieved.  Upon completion of the case instrument and sharps counts were confirmed correct. The patient was transferred to the PACU in good condition. I was present for all portions of the procedure.  PLAN: 81 mg by mouth daily.  DOAC.  High intensity statin therapy.  Follow-up with Dr. Chestine Spore in 4 weeks with ABI and duplex.  Rande Brunt. Lenell Antu, MD [facility] Phone Number: [phone] 12/05/2022 2:08 PM

## 2022-12-05 NOTE — Progress Notes (Signed)
[facility] [place]  Subjective  - no complaints.  No issues overnight.   Objective (!) 114/55 73 99.4 F (37.4 C) (Oral) 15 96%  Intake/Output Summary (Last 24 hours) at 12/05/2022 0912 Last data filed at 12/05/2022 0800 Gross per 24 hour  Intake 3354.43 ml  Output 725 ml  Net 2629.43 ml    Bilateral transfemoral sheaths without hematoma Brisk multiphasic left DP signal Brisk multiphasic right PT signal  Laboratory Lab Results: Recent Labs    12/04/22 1739 12/04/22 2249  WBC 6.6 6.8  HGB 12.4* 12.6*  HCT 38.5* 39.1  PLT 162 160   BMET Recent Labs    12/04/22 1003  NA 139  K 4.2  CL 102  GLUCOSE 128*  BUN 12  CREATININE 0.90    COAG Lab Results  Component Value Date   INR 1.0 01/29/2022   INR 1.0 08/08/2017   No results found for: "PTT"  Assessment/Planning:  72 year old male that underwent initial left transfemoral access for planned intervention on the right leg.  Ultimately found to have an occluded left leg bypass that was acute to subacute.  Then underwent right transfemoral access for left leg lysis of his occluded bypass.  Has a very brisk dorsalis pedis in the left leg and I suspect his lytics is working well.  Return to Cath Lab today for lytics check with Dr. Lenell [name]  [name] 12/05/2022 9:12 AM --

## 2022-12-06 LAB — HEPARIN LEVEL (UNFRACTIONATED): Heparin Unfractionated: 0.1 [IU]/mL — ABNORMAL LOW (ref 0.30–0.70)

## 2022-12-06 LAB — CBC
HCT: 34.9 % — ABNORMAL LOW (ref 39.0–52.0)
Hemoglobin: 11.6 g/dL — ABNORMAL LOW (ref 13.0–17.0)
MCH: 28.8 pg (ref 26.0–34.0)
MCHC: 33.2 g/dL (ref 30.0–36.0)
MCV: 86.6 fL (ref 80.0–100.0)
Platelets: 148 10*3/uL — ABNORMAL LOW (ref 150–400)
RBC: 4.03 MIL/uL — ABNORMAL LOW (ref 4.22–5.81)
RDW: 13 % (ref 11.5–15.5)
WBC: 7.6 10*3/uL (ref 4.0–10.5)
nRBC: 0 % (ref 0.0–0.2)

## 2022-12-06 LAB — LIPID PANEL
Cholesterol: 163 mg/dL (ref 0–200)
HDL: 35 mg/dL — ABNORMAL LOW (ref >40)
LDL Cholesterol: 109 mg/dL — ABNORMAL HIGH (ref 0–99)
Total CHOL/HDL Ratio: 4.7 {ratio}
Triglycerides: 96 mg/dL (ref <150)
VLDL: 19 mg/dL (ref 0–40)

## 2022-12-06 MED ORDER — APIXABAN 5 MG PO TABS: 5.0000 mg | ORAL_TABLET | Freq: Two times a day (BID) | ORAL | 3 refills | Status: DC

## 2022-12-06 MED ORDER — APIXABAN 5 MG PO TABS
5.0000 mg | ORAL_TABLET | Freq: Two times a day (BID) | ORAL | Status: DC
  Administered 2022-12-06: 5 mg via ORAL
  Filled 2022-12-06: qty 1

## 2022-12-06 NOTE — Discharge Summary (Signed)
[facility] Discharge Summary   Patient ID:  [name] MRN: [id number] DOB/AGE: 1951/01/21 72 y.o.  Admit date: 12/04/2022 Discharge date: 12/06/2022 Attending Surgeon: Chestine Spore Admission Diagnosis: Critical limb ischemia of left lower extremity (HCC) [I70.222]  Discharge Diagnoses:  Critical limb ischemia of left lower extremity (HCC) [I70.222]  Secondary Diagnoses: Past Medical History:  Diagnosis Date   Allergy    seasonal   Arthritis    " all over me- back, hips, knees, hands, everywhere "   Cancer [facility])    Prostate cancer age 10; Lupron treatments followed by prostatectomy.   Cataract    left removed, forming right   Chronic constipation    x 23 yrs after prostate surgery   Diabetes mellitus without complication (HCC)    Diabetic retinal damage of both eyes (HCC)    gets shots every month   GERD (gastroesophageal reflux disease)    Hyperlipidemia    Hypertension    Myocardial infarction [facility])    age 43; no stenting.   OSA (obstructive sleep apnea)    non-compliant with CPAP.   Sleep apnea    no cpap    Procedures: 12/05/2022 Procedure(s): LYSIS RECHECK PERIPHERAL VASCULAR INTERVENTION  Discharged Condition: good  HPI: [name] is a 72 y.o. ([birth year]) male who presents for follow up of PAD. He has hx of left CFA to AT bypass with non reversed GSV for CLI with tissue loss on 01/29/2022 by Dr. Chestine Spore.  He had left 2nd toe amputation by Dr. Ardelle Anton on 02/04/2022. His left 2nd toe remains healed. He does suffer from diabetic neuropathy. He does have a lot of decreased sensation and coldness in his feet bilaterally. However he explains that over past three month he has had pain in the right greater than left foot at rest. He says he is unable to sit or lay down with out pain. He says at lease one night per week he does not sleep at all and the other nights he may try to go to bed at 10 and will be awake until 2 or earlier in the morning  due to pain. He describes it as aching and coldness. He says he cannot get in comfortable position and just tosses and turns and eventually he just has to get up and walk around. He says he really can't describe the feeling in his feet. He says he does not really have any pain on  ambulation but he has some weakness in his legs. The weakness occurs only really on prolonged ambulation or when walking up an incline or stairs. He does not have any tissue loss.    The pt is on a statin for cholesterol management.    The pt is on an aspirin.  Other AC:  none The pt is on ACEI for hypertension.  The pt does have diabetes. Tobacco hx:  never  Hospital Course:  [name] is a 72 y.o. male is 1 Day Post-Op Admitted for angiogram. Identified thrombosed bypass graft at angiography. Underwent catheter directed thrombolysis. ON catheter recheck, found multiple areas of stenosis that responded well to angioplasty. Palpable pulse post procedure. Discharged 12/06/22 in good condition. Will start Eliquis at discharge.   Significant Diagnostic Studies: CBC    Component Value Date/Time   WBC 7.6 12/06/2022 0341   RBC 4.03 (L) 12/06/2022 0341   HGB 11.6 (L) 12/06/2022 0341   HGB 12.7 (L) 10/07/2022 0810   HCT 34.9 (L) 12/06/2022 0341  HCT 38.5 10/07/2022 0810   PLT 148 (L) 12/06/2022 0341   PLT 191 10/07/2022 0810   MCV 86.6 12/06/2022 0341   MCV 88 10/07/2022 0810   MCH 28.8 12/06/2022 0341   MCHC 33.2 12/06/2022 0341   RDW 13.0 12/06/2022 0341   RDW 13.1 10/07/2022 0810   LYMPHSABS 3.3 (H) 10/07/2022 0810   MONOABS 0.5 08/03/2018 0920   EOSABS 0.2 10/07/2022 0810   BASOSABS 0.1 10/07/2022 0810    BMET    Component Value Date/Time   NA 137 12/05/2022 0956   NA 141 10/07/2022 0810   K 4.0 12/05/2022 0956   CL 104 12/05/2022 0956   CO2 24 12/05/2022 0956   GLUCOSE 117 (H) 12/05/2022 0956   BUN 12 12/05/2022 0956   BUN 14 10/07/2022 0810   CREATININE 0.98 12/05/2022 0956    CREATININE 0.86 08/04/2015 1401   CALCIUM 8.1 (L) 12/05/2022 0956   GFRNONAA >60 12/05/2022 0956   GFRNONAA >89 08/04/2015 1401   GFRAA 90 05/06/2019 1054   GFRAA >89 08/04/2015 1401    COAG estimated creatinine clearance is 84.5 mL/min (by C-G formula based on SCr of 0.98 mg/dL).  No results found for: "PTT"  Disposition:  Discharge to :Home  Allergies as of 12/06/2022       Reactions   Cymbalta [duloxetine Hcl] Nausea And Vomiting   Darvocet [propoxyphene N-acetaminophen] Hives        Medication List     TAKE these medications    apixaban 5 MG Tabs tablet Commonly known as: ELIQUIS Take 1 tablet (5 mg total) by mouth 2 (two) times daily.   aspirin 81 MG tablet Take 81 mg by mouth daily.   Bayer Contour Next Monitor w/Device Kit Check blood sugar two times daily.   Bayer Contour Next Test test strip Generic drug: glucose blood CHECK BLOOD SUGAR TWICE DAILY   Bayer Microlet Lancets lancets Check blood sugar two times daily.   ciprofloxacin 0.3 % ophthalmic solution Commonly known as: CILOXAN Place 1 drop into both eyes See admin instructions. NSTILL ONE DROP INTO BOTH EYES 4 TIMES A DAY FOR TWO DAYS AFTER EACH MONTHLY EYE INJECTION   dapagliflozin propanediol 5 MG Tabs tablet Commonly known as: Farxiga Take 1 tablet (5 mg total) by mouth daily before breakfast.   fluticasone 50 MCG/ACT nasal spray Commonly known as: FLONASE Place 1 spray into both nostrils daily as needed for allergies or rhinitis.   gabapentin 300 MG capsule Commonly known as: NEURONTIN Take 3 capsules (900 mg total) by mouth 3 (three) times daily. What changed: how much to take   lisinopril 5 MG tablet Commonly known as: ZESTRIL Take 1 tablet (5 mg total) by mouth daily.   metFORMIN 500 MG 24 hr tablet Commonly known as: GLUCOPHAGE-XR Take 2 tablets (1,000 mg total) by mouth daily with breakfast AND 2 tablets (1,000 mg total) every evening. 4 tablets daily. What changed: See the  new instructions.   pantoprazole 40 MG tablet Commonly known as: PROTONIX TAKE 1 TABLET (40 MG TOTAL) BY MOUTH TWICE A DAY BEFORE MEALS   rosuvastatin 20 MG tablet Commonly known as: CRESTOR Take 1 tablet (20 mg total) by mouth daily.        Verbal and written Discharge instructions given to the patient. Wound care per Discharge AVS  Rande Brunt. Lenell Antu, MD [facility] Phone Number: [phone] 12/06/2022 8:28 AM

## 2022-12-06 NOTE — Care Management (Signed)
Patient in bathroom. [name] 30 day coupon provided to visitor at bedside.

## 2022-12-06 NOTE — Plan of Care (Signed)
  Problem: Education: Goal: Understanding of CV disease, CV risk reduction, and recovery process will improve Outcome: Adequate for Discharge Goal: Individualized Educational Video(s) Outcome: Adequate for Discharge   Problem: Activity: Goal: Ability to return to baseline activity level will improve Outcome: Adequate for Discharge   Problem: Cardiovascular: Goal: Ability to achieve and maintain adequate cardiovascular perfusion will improve Outcome: Adequate for Discharge Goal: Vascular access site(s) Level 0-1 will be maintained Outcome: Adequate for Discharge   Problem: Health Behavior/Discharge Planning: Goal: Ability to safely manage health-related needs after discharge will improve Outcome: Adequate for Discharge   Problem: Education: Goal: Knowledge of prescribed regimen will improve Outcome: Adequate for Discharge   Problem: Activity: Goal: Ability to tolerate increased activity will improve Outcome: Adequate for Discharge   Problem: Bowel/Gastric: Goal: Gastrointestinal status for postoperative course will improve Outcome: Adequate for Discharge   Problem: Clinical Measurements: Goal: Postoperative complications will be avoided or minimized Outcome: Adequate for Discharge Goal: Signs and symptoms of graft occlusion will improve Outcome: Adequate for Discharge   Problem: Skin Integrity: Goal: Demonstration of wound healing without infection will improve Outcome: Adequate for Discharge   Problem: Education: Goal: Knowledge of General Education information will improve Description: Including pain rating scale, medication(s)/side effects and non-pharmacologic comfort measures Outcome: Adequate for Discharge   Problem: Health Behavior/Discharge Planning: Goal: Ability to manage health-related needs will improve Outcome: Adequate for Discharge   Problem: Clinical Measurements: Goal: Ability to maintain clinical measurements within normal limits will  improve Outcome: Adequate for Discharge Goal: Will remain free from infection Outcome: Adequate for Discharge Goal: Diagnostic test results will improve Outcome: Adequate for Discharge Goal: Respiratory complications will improve Outcome: Adequate for Discharge Goal: Cardiovascular complication will be avoided Outcome: Adequate for Discharge   Problem: Activity: Goal: Risk for activity intolerance will decrease Outcome: Adequate for Discharge   Problem: Nutrition: Goal: Adequate nutrition will be maintained Outcome: Adequate for Discharge   Problem: Coping: Goal: Level of anxiety will decrease Outcome: Adequate for Discharge   Problem: Elimination: Goal: Will not experience complications related to bowel motility Outcome: Adequate for Discharge Goal: Will not experience complications related to urinary retention Outcome: Adequate for Discharge   Problem: Pain Managment: Goal: General experience of comfort will improve Outcome: Adequate for Discharge   Problem: Safety: Goal: Ability to remain free from injury will improve Outcome: Adequate for Discharge   Problem: Skin Integrity: Goal: Risk for impaired skin integrity will decrease Outcome: Adequate for Discharge

## 2022-12-06 NOTE — Progress Notes (Signed)
PHARMACY - ANTICOAGULATION Pharmacy Consult for Heparin Indication: limb ischemia Brief A/P: Heparin level subtherapeutic Increase Heparin rate  Allergies  Allergen Reactions   Cymbalta [Duloxetine Hcl] Nausea And Vomiting   Darvocet [Propoxyphene N-Acetaminophen] Hives    Patient Measurements: Height: 5' 10.5" (179.1 cm) Weight: 98.6 kg (217 lb 6 oz) IBW/kg (Calculated) : 74.15 Heparin Dosing Weight: 93.7 kg  Vital Signs: Temp: 99.8 F (37.7 C) (10/05 0326) Temp Source: Axillary (10/05 0326) BP: 116/58 (10/05 0100) Pulse Rate: 71 (10/05 0100)  Labs: Recent Labs    12/04/22 1003 12/04/22 1739 12/05/22 0956 12/05/22 1731 12/06/22 0341  HGB 13.9   < > 11.7* 11.6* 11.6*  HCT 41.0   < > 36.1* 35.9* 34.9*  PLT  --    < > 144* 150 148*  HEPARINUNFRC  --    < > <0.10* <0.10* 0.10*  CREATININE 0.90  --  0.98  --   --    < > = values in this interval not displayed.    Estimated Creatinine Clearance: 84.5 mL/min (by C-G formula based on SCr of 0.98 mg/dL).  Assessment: 72 yo male with PAD s/p thrombolysis of LLE bypass graft,  for heparin.   Goal of Therapy:  Heparin level 0.2-0.5 units/ml Monitor platelets by anticoagulation protocol: Yes   Plan:  Increase Heparin 1500 units/hr  F/U  DOAC  Geannie Risen, PharmD, BCPS

## 2022-12-06 NOTE — Progress Notes (Signed)
0900 Patient discharged.  IV removed. Discharge summary given to patient.  Patient verbalizes understanding.  Left in a private vehicle at main enterance.

## 2022-12-08 ENCOUNTER — Telehealth: Payer: Self-pay

## 2022-12-08 ENCOUNTER — Encounter (HOSPITAL_COMMUNITY): Payer: Self-pay | Admitting: Vascular Surgery

## 2022-12-08 NOTE — Patient Outreach (Deleted)
{  [facility] TOC PROGRAM NOTES:[id number]}

## 2022-12-08 NOTE — Transitions of Care (Post Inpatient/ED Visit) (Signed)
12/08/2022  Name: [name] MRN: [id number] DOB: April 30, 1950  Today's TOC FU Call Status: Today's TOC FU Call Status:: Successful TOC FU Call Completed TOC FU Call Complete Date: 12/06/22 Patient's Name and Date of Birth confirmed.  Transition Care Management Follow-up Telephone Call Date of Discharge: 12/06/22 Discharge Facility: [facility]) Type of Discharge: Inpatient Admission Primary Inpatient Discharge Diagnosis:: Critical Limb ischemia of LLE How have you been since you were released from the hospital?: Better Any questions or concerns?: No  Items Reviewed: Did you receive and understand the discharge instructions provided?: Yes Medications obtained,verified, and reconciled?: Yes (Medications Reviewed) Any new allergies since your discharge?: No Dietary orders reviewed?: NA Do you have support at home?: Yes People in Home: significant other Name of Support/Comfort Primary Source: Ernest Mallick  Medications Reviewed Today: Medications Reviewed Today     Reviewed by Redge Gainer, RN (Case Manager) on 12/08/22 at 1511  Med List Status: <None>   Medication Order Taking? Sig Documenting Provider Last Dose Status Informant  apixaban (ELIQUIS) 5 MG TABS tablet [id number] Yes Take 1 tablet (5 mg total) by mouth 2 (two) times daily. Leonie Douglas, MD Taking Active   aspirin 81 MG tablet [id number] Yes Take 81 mg by mouth daily. [provider] Taking Active Self  BAYER CONTOUR NEXT TEST test strip [id number]  CHECK BLOOD SUGAR TWICE DAILY Reather Littler, MD  Active Self           Med Note Alona Bene, CYNTHIA A   Wed Aug 12, 2017  8:09 AM) use  BAYER MICROLET LANCETS lancets [id number]  Check blood sugar two times daily. Reather Littler, MD  Active Self           Med Note Alona Bene, CYNTHIA A   Wed Aug 12, 2017  8:09 AM) use  Blood Glucose Monitoring Suppl (BAYER CONTOUR NEXT MONITOR) w/Device KIT [id number]  Check blood sugar two times daily. Reather Littler, MD  Active  Self           Med Note Alona Bene, CYNTHIA A   Wed Aug 12, 2017  8:09 AM) use  ciprofloxacin (CILOXAN) 0.3 % ophthalmic solution [id number] Yes Place 1 drop into both eyes See admin instructions. NSTILL ONE DROP INTO BOTH EYES 4 TIMES A DAY FOR TWO DAYS AFTER EACH MONTHLY EYE INJECTION [provider] Taking Active Self  dapagliflozin propanediol (FARXIGA) 5 MG TABS tablet [id number] No Take 1 tablet (5 mg total) by mouth daily before breakfast.  Patient not taking: Reported on 12/02/2022   Sonny Masters, FNP Not Taking Active Self  fluticasone (FLONASE) 50 MCG/ACT nasal spray [id number] Yes Place 1 spray into both nostrils daily as needed for allergies or rhinitis. [provider] Taking Active Self  gabapentin (NEURONTIN) 300 MG capsule [id number] Yes Take 3 capsules (900 mg total) by mouth 3 (three) times daily.  Patient taking differently: Take 1,200 mg by mouth 3 (three) times daily.   Sonny Masters, FNP Taking Active Self  lisinopril (ZESTRIL) 5 MG tablet [id number] Yes Take 1 tablet (5 mg total) by mouth daily. Ivonne Andrew, NP Taking Active Self  metFORMIN (GLUCOPHAGE-XR) 500 MG 24 hr tablet [id number] Yes Take 2 tablets (1,000 mg total) by mouth daily with breakfast AND 2 tablets (1,000 mg total) every evening. 4 tablets daily.  Patient taking differently: Take 3 tablets (1,1500 mg total) by mouth daily with breakfast AND 3 tablets (1,500 mg total) every evening.    Sonny Masters, FNP  Taking Active Self  pantoprazole (PROTONIX) 40 MG tablet [id number] Yes TAKE 1 TABLET (40 MG TOTAL) BY MOUTH TWICE A DAY BEFORE MEALS Ivonne Andrew, NP Taking Active Self  rosuvastatin (CRESTOR) 20 MG tablet [id number] Yes Take 1 tablet (20 mg total) by mouth daily. Graceann Congress, PA-C Taking Active Self            Home Care and Equipment/Supplies: Were Home Health Services Ordered?: No Any new equipment or medical supplies ordered?: No  Functional Questionnaire: Do you need  assistance with bathing/showering or dressing?: No Do you need assistance with meal preparation?: No Do you need assistance with eating?: No Do you have difficulty maintaining continence: No Do you need assistance with getting out of bed/getting out of a chair/moving?: No Do you have difficulty managing or taking your medications?: No  Follow up appointments reviewed: PCP Follow-up appointment confirmed?: Yes Date of PCP follow-up appointment?: 01/07/23 (The patient does not feel he needs to be seen sooner) Follow-up Provider: Luisa Hart Follow-up appointment confirmed?: Yes Date of Specialist follow-up appointment?: 01/01/23 Follow-Up Specialty Provider:: Dr. Chestine Spore with [facility] Do you need transportation to your follow-up appointment?: No Do you understand care options if your condition(s) worsen?: Yes-patient verbalized understanding    Goals Addressed             This Visit's Progress    Patient Stated       Current Barriers:  Care Coordination needs related to Angioplasty and limb ischemia of extremities  RNCM Clinical Goal(s):  Patient will work with the Care Management team over the next 30 days to address Transition of Care Barriers: Medication Management Support at home Provider appointments through collaboration with RN Care manager, provider, and care team.   Interventions: Evaluation of current treatment plan related to  self management and patient's adherence to plan as established by provider   Diabetes Interventions:  (Status:  Goal on track:  Yes.) Short Term Goal Assessed patient's understanding of A1c goal: <7% Reviewed medications with patient and discussed importance of medication adherence Discussed plans with patient for ongoing care management follow up and provided patient with direct contact information for care management team Lab Results  Component Value Date   HGBA1C 7.2 (H) 10/07/2022    Patient Goals/Self-Care  Activities: Participate in Transition of Care Program/Attend [facility] scheduled calls Notify RN Care Manager of [facility] call rescheduling needs Take all medications as prescribed Attend all scheduled provider appointments  Follow Up Plan:  Telephone follow up appointment with care management team member scheduled for:  12/15/2022         Deidre Ala, RN RN Care Manager [facility]Population [facility] [phone]

## 2022-12-16 ENCOUNTER — Other Ambulatory Visit: Payer: Medicare HMO

## 2022-12-16 NOTE — Patient Outreach (Signed)
Care Management  Transitions of Care Program Transitions of Care Post-discharge week 2   12/16/2022 Name: [name] MRN: [id number] DOB: June 15, 1950  Subjective: [name] is a 72 y.o. year old male who is a primary care patient of Rakes, Doralee Albino, FNP. The Care Management team Engaged with patient Engaged with patient by telephone to assess and address transitions of care needs.   Consent to Services:  Patient was given information about care management services, agreed to services, and gave verbal consent to participate.   Assessment:  Date of Discharge: 12/06/22 Discharge Facility: [facility]) Type of Discharge: Inpatient Admission Primary Inpatient Discharge Diagnosis:: Limb ischemia  SDOH Interventions    Flowsheet Row Patient Outreach from 12/16/2022 in [place] POPULATION HEALTH DEPARTMENT Clinical Support from 12/01/2022 in [facility] Office Visit from 10/07/2022 in [facility] Telephone from 02/12/2022 in [facility] Community Care Coordination  SDOH Interventions      Food Insecurity Interventions Intervention Not Indicated Intervention Not Indicated -- --  Housing Interventions Intervention Not Indicated Intervention Not Indicated -- --  Transportation Interventions Intervention Not Indicated Intervention Not Indicated -- Intervention Not Indicated  Utilities Interventions Intervention Not Indicated Intervention Not Indicated -- --  Alcohol Usage Interventions Intervention Not Indicated (Score <7) Intervention Not Indicated (Score <7) -- --  Depression Interventions/Treatment  -- PHQ2-9 Score <4 Follow-up Not Indicated PHQ2-9 Score <4 Follow-up Not Indicated --  Financial Strain Interventions -- Intervention Not Indicated -- --  Physical Activity Interventions -- Intervention Not Indicated -- --  Stress Interventions -- Intervention Not Indicated -- --  Social  Connections Interventions Intervention Not Indicated Intervention Not Indicated -- --  Health Literacy Interventions Intervention Not Indicated Intervention Not Indicated -- --        Goals Addressed             This Visit's Progress    Patient Stated       Current Barriers:  Knowledge Deficits related to plan of care for management of DMII   RNCM Clinical Goal(s):  Patient will work with the Care Management team over the next 30 days to address Transition of Care Barriers: Medication Management Diet/Nutrition/Food Resources Support at home Provider appointments verbalize basic understanding of  DMII disease process and self health management plan as evidenced by verbal feedback  through collaboration with Medical illustrator, provider, and care team.   Interventions: Evaluation of current treatment plan related to  self management and patient's adherence to plan as established by provider   Diabetes Interventions:  (Status:  New goal.) Short Term Goal Assessed patient's understanding of A1c goal: <7% Provided education to patient about basic DM disease process Reviewed medications with patient and discussed importance of medication adherence Counseled on importance of regular laboratory monitoring as prescribed Reviewed scheduled/upcoming provider appointments including: 01/07/2023 with PCP Lab Results  Component Value Date   HGBA1C 7.2 (H) 10/07/2022    Patient Goals/Self-Care Activities: Participate in Transition of Care Program/Attend [facility] scheduled calls Notify RN Care Manager of TOC call rescheduling needs Take all medications as prescribed Attend all scheduled provider appointments Perform all self care activities independently  keep appointment with eye doctor check feet daily for cuts, sores or redness take the blood sugar log to all doctor visits fill half of plate with vegetables manage portion size  Follow Up Plan:  Telephone follow up appointment with care  management team member scheduled for:  December 23, 2022         The patient is followed by [facility] related to Diabetic neuropathy and pain management. The patient is taking his medications and checking his blood sugars but the pain in his feet is preventing quality of life. The patient cannot not sleep due to pain. Advised discussing this with his provider at the next appointment.   Deidre Ala, RN Medical illustrator [facility]Population [facility] [phone]

## 2022-12-16 NOTE — Patient Instructions (Signed)
Visit Information  Thank you for taking time to visit with me today. Please don't hesitate to contact me if I can be of assistance to you before our next scheduled telephone appointment.  Our next appointment is by telephone on December 23, 2022 at 10:00am  Following is a copy of your care plan:   Goals Addressed             This Visit's Progress    Patient Stated       Current Barriers:  Knowledge Deficits related to plan of care for management of DMII   RNCM Clinical Goal(s):  Patient will work with the Care Management team over the next 30 days to address Transition of Care Barriers: Medication Management Diet/Nutrition/Food Resources Support at home Provider appointments verbalize basic understanding of  DMII disease process and self health management plan as evidenced by verbal feedback  through collaboration with RN Care manager, provider, and care team.   Interventions: Evaluation of current treatment plan related to  self management and patient's adherence to plan as established by provider   Diabetes Interventions:  (Status:  New goal.) Short Term Goal Assessed patient's understanding of A1c goal: <7% Provided education to patient about basic DM disease process Reviewed medications with patient and discussed importance of medication adherence Counseled on importance of regular laboratory monitoring as prescribed Reviewed scheduled/upcoming provider appointments including: 01/07/2023 with PCP Lab Results  Component Value Date   HGBA1C 7.2 (H) 10/07/2022    Patient Goals/Self-Care Activities: Participate in Transition of Care Program/Attend [facility] scheduled calls Notify RN Care Manager of [facility] call rescheduling needs Take all medications as prescribed Attend all scheduled provider appointments Perform all self care activities independently  keep appointment with eye doctor check feet daily for cuts, sores or redness take the blood sugar log to all doctor visits fill  half of plate with vegetables manage portion size  Follow Up Plan:  Telephone follow up appointment with care management team member scheduled for:  December 23, 2022          The patient verbalized understanding of instructions, educational materials, and care plan provided today and agreed to receive a mailed copy of patient instructions, educational materials, and care plan.    Please call the care guide team at [phone] if you need to cancel or reschedule your appointment.     Deidre Ala, RN Medical illustrator [facility]Population [facility] [phone]

## 2022-12-19 ENCOUNTER — Other Ambulatory Visit: Payer: Self-pay

## 2022-12-19 DIAGNOSIS — I70222 Atherosclerosis of native arteries of extremities with rest pain, left leg: Secondary | ICD-10-CM

## 2022-12-23 ENCOUNTER — Other Ambulatory Visit: Payer: Self-pay

## 2022-12-23 ENCOUNTER — Telehealth: Payer: Self-pay

## 2022-12-23 NOTE — Patient Instructions (Signed)
Visit Information  Thank you for taking time to visit with me today. Please don't hesitate to contact me if I can be of assistance to you before our next scheduled telephone appointment.   Following is a copy of your care plan:   Goals Addressed             This Visit's Progress    Patient Stated       Current Barriers:  Care Coordination needs related to Angioplasty and limb ischemia of extremities  RNCM Clinical Goal(s):  Patient will work with the Care Management team over the next 30 days to address Transition of Care Barriers: Medication Management Support at home Provider appointments through collaboration with RN Care manager, provider, and care team.   Interventions: Evaluation of current treatment plan related to  self management and patient's adherence to plan as established by provider   Diabetes Interventions:  (Status:  Goal on track:  Yes.) Short Term Goal Assessed patient's understanding of A1c goal: <7% Reviewed medications with patient and discussed importance of medication adherence Discussed plans with patient for ongoing care management follow up and provided patient with direct contact information for care management team Lab Results  Component Value Date   HGBA1C 7.2 (H) 10/07/2022    Patient Goals/Self-Care Activities: Participate in Transition of Care Program/Attend [facility] scheduled calls Notify RN Care Manager of TOC call rescheduling needs Take all medications as prescribed Attend all scheduled provider appointments  Follow Up Plan:  Telephone follow up appointment with care management team member scheduled for:  111/03/2022     Patient Stated       Current Barriers:  Knowledge Deficits related to plan of care for management of DMII   RNCM Clinical Goal(s):  Patient will work with the Care Management team over the next 30 days to address Transition of Care Barriers: Medication Management Diet/Nutrition/Food Resources Support at home Provider  appointments verbalize basic understanding of  DMII disease process and self health management plan as evidenced by verbal feedback  through collaboration with RN Care manager, provider, and care team.   Interventions: Evaluation of current treatment plan related to  self management and patient's adherence to plan as established by provider   Diabetes Interventions:  (Status:  New goal.) Short Term Goal Assessed patient's understanding of A1c goal: <7% Provided education to patient about basic DM disease process Reviewed medications with patient and discussed importance of medication adherence Counseled on importance of regular laboratory monitoring as prescribed Reviewed scheduled/upcoming provider appointments including: 01/07/2023 with PCP Lab Results  Component Value Date   HGBA1C 7.2 (H) 10/07/2022    Patient Goals/Self-Care Activities: Participate in Transition of Care Program/Attend [facility] scheduled calls Notify RN Care Manager of [facility] call rescheduling needs Take all medications as prescribed Attend all scheduled provider appointments Perform all self care activities independently  keep appointment with eye doctor check feet daily for cuts, sores or redness take the blood sugar log to all doctor visits fill half of plate with vegetables manage portion size  Follow Up Plan:  Telephone follow up appointment with care management team member scheduled for:  January 02, 2023          Patient verbalizes understanding of instructions and care plan provided today and agrees to view in [facility]. Active MyChart status and patient understanding of how to access instructions and care plan via MyChart confirmed with patient.     Telephone follow up appointment with care management team member scheduled for: January 02, 2023  at 11am  Please call the care guide team at [phone] if you need to cancel or reschedule your appointment.   Please call the [place] National Suicide Prevention  Lifeline: (414)030-1383 or TTY: 2104686447 TTY (613) 779-0321) to talk to a trained counselor if you are experiencing a Mental Health or Behavioral Health Crisis or need someone to talk to.  .chs

## 2022-12-23 NOTE — Patient Outreach (Signed)
Care Management  Transitions of Care Program Transitions of Care Post-discharge week 3   12/23/2022 Name: [name] MRN: [id number] DOB: 11/18/1950  Subjective: [name] is a 72 y.o. year old male who is a primary care patient of Rakes, Doralee Albino, FNP. The Care Management team Engaged with patient Engaged with patient by telephone to assess and address transitions of care needs.   Consent to Services:  Patient was given information about care management services, agreed to services, and gave verbal consent to participate.   Assessment:           SDOH Interventions    Flowsheet Row Patient Outreach from 12/23/2022 in [place] POPULATION HEALTH DEPARTMENT Patient Outreach from 12/16/2022 in [place] POPULATION HEALTH DEPARTMENT Clinical Support from 12/01/2022 in [facility] Office Visit from 10/07/2022 in [facility] Telephone from 02/12/2022 in [facility] Community Care Coordination  SDOH Interventions       Food Insecurity Interventions -- Intervention Not Indicated Intervention Not Indicated -- --  Housing Interventions -- Intervention Not Indicated Intervention Not Indicated -- --  Transportation Interventions -- Intervention Not Indicated Intervention Not Indicated -- Intervention Not Indicated  Utilities Interventions -- Intervention Not Indicated Intervention Not Indicated -- --  Alcohol Usage Interventions -- Intervention Not Indicated (Score <7) Intervention Not Indicated (Score <7) -- --  Depression Interventions/Treatment  -- -- PHQ2-9 Score <4 Follow-up Not Indicated PHQ2-9 Score <4 Follow-up Not Indicated --  Financial Strain Interventions Intervention Not Indicated -- Intervention Not Indicated -- --  Physical Activity Interventions Patient Declined -- Intervention Not Indicated -- --  Stress Interventions Intervention Not Indicated -- Intervention Not Indicated -- --   Social Connections Interventions -- Intervention Not Indicated Intervention Not Indicated -- --  Health Literacy Interventions -- Intervention Not Indicated Intervention Not Indicated -- --        Goals Addressed             This Visit's Progress    Patient Stated       Current Barriers:  Care Coordination needs related to Angioplasty and limb ischemia of extremities  RNCM Clinical Goal(s):  Patient will work with the Care Management team over the next 30 days to address Transition of Care Barriers: Medication Management Support at home Provider appointments through collaboration with RN Care manager, provider, and care team.   Interventions: Evaluation of current treatment plan related to  self management and patient's adherence to plan as established by provider   Diabetes Interventions:  (Status:  Goal on track:  Yes.) Short Term Goal Assessed patient's understanding of A1c goal: <7% Reviewed medications with patient and discussed importance of medication adherence Discussed plans with patient for ongoing care management follow up and provided patient with direct contact information for care management team Lab Results  Component Value Date   HGBA1C 7.2 (H) 10/07/2022    Patient Goals/Self-Care Activities: Participate in Transition of Care Program/Attend [facility] scheduled calls Notify RN Care Manager of T[facility] call rescheduling needs Take all medications as prescribed Attend all scheduled provider appointments  Follow Up Plan:  Telephone follow up appointment with care management team member scheduled for:  111/03/2022     Patient Stated       Current Barriers:  Knowledge Deficits related to plan of care for management of DMII   RNCM Clinical Goal(s):  Patient will work with the Care Management team over the next 30 days to address Transition  of Care Barriers: Medication Management Diet/Nutrition/Food Resources Support at home Provider appointments verbalize basic  understanding of  DMII disease process and self health management plan as evidenced by verbal feedback  through collaboration with RN Care manager, provider, and care team.   Interventions: Evaluation of current treatment plan related to  self management and patient's adherence to plan as established by provider   Diabetes Interventions:  (Status:  New goal.) Short Term Goal Assessed patient's understanding of A1c goal: <7% Provided education to patient about basic DM disease process Reviewed medications with patient and discussed importance of medication adherence Counseled on importance of regular laboratory monitoring as prescribed Reviewed scheduled/upcoming provider appointments including: 01/07/2023 with PCP Lab Results  Component Value Date   HGBA1C 7.2 (H) 10/07/2022    Patient Goals/Self-Care Activities: Participate in Transition of Care Program/Attend [facility] scheduled calls Notify RN Care Manager of [facility] call rescheduling needs Take all medications as prescribed Attend all scheduled provider appointments Perform all self care activities independently  keep appointment with eye doctor check feet daily for cuts, sores or redness take the blood sugar log to all doctor visits fill half of plate with vegetables manage portion size  Follow Up Plan:  Telephone follow up appointment with care management team member scheduled for:  January 02, 2023         Engaged with the patient on the phone today. He was laying down. He states he worked in his yard for a total of six hours over the weekend and he was in some pain. He does state that his ischemic pain is better likely due to decreased swelling. He continue with lack of sleep because he cannot get comfortable. He has been taking his blood sugars which range in the 90's - 142. No new concerns. Confirmed appointment with Dr. Chestine Spore for 01/01/23. RNCM to follow up after that appointment and then D/C from [facility] 30 day enrollment  Deidre Ala, RN RN Care Manager [facility]Population [facility] [phone]

## 2022-12-24 NOTE — Telephone Encounter (Signed)
error 

## 2022-12-26 ENCOUNTER — Encounter (INDEPENDENT_AMBULATORY_CARE_PROVIDER_SITE_OTHER): Payer: Medicare HMO | Admitting: Ophthalmology

## 2022-12-26 DIAGNOSIS — Z7984 Long term (current) use of oral hypoglycemic drugs: Secondary | ICD-10-CM

## 2022-12-26 DIAGNOSIS — H43813 Vitreous degeneration, bilateral: Secondary | ICD-10-CM | POA: Diagnosis not present

## 2022-12-26 DIAGNOSIS — I10 Essential (primary) hypertension: Secondary | ICD-10-CM | POA: Diagnosis not present

## 2022-12-26 DIAGNOSIS — H35033 Hypertensive retinopathy, bilateral: Secondary | ICD-10-CM | POA: Diagnosis not present

## 2022-12-26 DIAGNOSIS — H2511 Age-related nuclear cataract, right eye: Secondary | ICD-10-CM

## 2022-12-26 DIAGNOSIS — E113313 Type 2 diabetes mellitus with moderate nonproliferative diabetic retinopathy with macular edema, bilateral: Secondary | ICD-10-CM

## 2022-12-26 LAB — HM DIABETES EYE EXAM

## 2023-01-01 ENCOUNTER — Ambulatory Visit (INDEPENDENT_AMBULATORY_CARE_PROVIDER_SITE_OTHER)
Admit: 2023-01-01 | Discharge: 2023-01-01 | Disposition: A | Discharge status: Home / Self Care | Payer: Medicare HMO | Attending: Vascular Surgery | Admitting: Vascular Surgery

## 2023-01-01 ENCOUNTER — Ambulatory Visit (HOSPITAL_COMMUNITY)
Admission: RE | Admit: 2023-01-01 | Discharge: 2023-01-01 | Disposition: A | Discharge status: Home / Self Care | Payer: Medicare HMO | Source: Ambulatory Visit | Attending: Vascular Surgery | Admitting: Vascular Surgery

## 2023-01-01 DIAGNOSIS — I70222 Atherosclerosis of native arteries of extremities with rest pain, left leg: Secondary | ICD-10-CM

## 2023-01-01 LAB — VAS US ABI WITH/WO TBI: Left ABI: 1.09

## 2023-01-02 ENCOUNTER — Other Ambulatory Visit: Payer: Self-pay

## 2023-01-02 NOTE — Patient Outreach (Signed)
Care Management  Transitions of Care Program Transitions of Care Post-discharge week 4   01/02/2023 Name: [name] MRN: [id number] DOB: Jan 19, 1951  Subjective: Clayton Chuck. is a 72 y.o. year old male who is a primary care patient of Rakes, Doralee Albino, FNP. The Care Management team Engaged with patient Engaged with patient by telephone to assess and address transitions of care needs.   Consent to Services:  Patient was given information about care management services, agreed to services, and gave verbal consent to participate.   Assessment:           SDOH Interventions    Flowsheet Row Patient Outreach from 12/23/2022 in [place] [facility] Patient Outreach from 12/16/2022 in [place] [facility] Clinical Support from 12/01/2022 in [facility] Office Visit from 10/07/2022 in [facility] Telephone from 02/12/2022 in [facility] Coordination  SDOH Interventions       Food Insecurity Interventions -- Intervention Not Indicated Intervention Not Indicated -- --  Housing Interventions -- Intervention Not Indicated Intervention Not Indicated -- --  Transportation Interventions -- Intervention Not Indicated Intervention Not Indicated -- Intervention Not Indicated  Utilities Interventions -- Intervention Not Indicated Intervention Not Indicated -- --  Alcohol Usage Interventions -- Intervention Not Indicated (Score <7) Intervention Not Indicated (Score <7) -- --  Depression Interventions/Treatment  -- -- PHQ2-9 Score <4 Follow-up Not Indicated PHQ2-9 Score <4 Follow-up Not Indicated --  Financial Strain Interventions Intervention Not Indicated -- Intervention Not Indicated -- --  Physical Activity Interventions Patient Declined -- Intervention Not Indicated -- --  Stress Interventions Intervention Not Indicated -- Intervention Not Indicated -- --   Social Connections Interventions -- Intervention Not Indicated Intervention Not Indicated -- --  Health Literacy Interventions -- Intervention Not Indicated Intervention Not Indicated -- --        Goals Addressed             This Visit's Progress    COMPLETED: Patient Stated       Current Barriers:  Care Coordination needs related to Angioplasty and limb ischemia of extremities  RNCM Clinical Goal(s):  Patient will work with the Care Management team over the next 30 days to address Transition of Care Barriers: Medication Management Support at home Provider appointments through collaboration with RN Care manager, provider, and care team.   Interventions: Evaluation of current treatment plan related to  self management and patient's adherence to plan as established by provider   Diabetes Interventions:  (Status:  Goal on track:  Yes.) Short Term Goal Assessed patient's understanding of A1c goal: <7% Reviewed medications with patient and discussed importance of medication adherence Discussed plans with patient for ongoing care management follow up and provided patient with direct contact information for care management team Lab Results  Component Value Date   HGBA1C 7.2 (H) 10/07/2022    Patient Goals/Self-Care Activities: Participate in Transition of Care Program/Attend [facility] scheduled calls Notify RN Care Manager of [facility] call rescheduling needs Take all medications as prescribed Attend all scheduled provider appointments  Follow Up Plan:  Telephone follow up appointment with care management team member scheduled for:  111/03/2022     COMPLETED: Patient Stated       Current Barriers:  Knowledge Deficits related to plan of care for management of DMII   RNCM Clinical Goal(s):  Patient will work with the Care Management team over the next 30 days to  address Transition of Care Barriers: Medication Management Diet/Nutrition/Food Resources Support at home Provider  appointments verbalize basic understanding of  DMII disease process and self health management plan as evidenced by verbal feedback  through collaboration with RN Care manager, provider, and care team.   Interventions: Evaluation of current treatment plan related to  self management and patient's adherence to plan as established by provider   Diabetes Interventions:  (Status:  New goal.) Short Term Goal Assessed patient's understanding of A1c goal: <7% Provided education to patient about basic DM disease process Reviewed medications with patient and discussed importance of medication adherence Counseled on importance of regular laboratory monitoring as prescribed Reviewed scheduled/upcoming provider appointments including: 01/07/2023 with PCP Lab Results  Component Value Date   HGBA1C 7.2 (H) 10/07/2022    Patient Goals/Self-Care Activities: Participate in Transition of Care Program/Attend [facility] scheduled calls Notify RN Care Manager of [facility] call rescheduling needs Take all medications as prescribed Attend all scheduled provider appointments Perform all self care activities independently  keep appointment with eye doctor check feet daily for cuts, sores or redness take the blood sugar log to all doctor visits fill half of plate with vegetables manage portion size  Follow Up Plan:  Telephone follow up appointment with care management team member scheduled for:  January 02, 2023          The patient declined enrollment for the Longitudinal Complex Care Management  Deidre Ala, RN RN Care Manager [facility] [phone]

## 2023-01-02 NOTE — Patient Instructions (Signed)
Visit Information  Thank you for taking time to visit with me today. Please don't hesitate to contact me if I can be of assistance to you before our next scheduled telephone appointment.   Following is a copy of your care plan:   Goals Addressed             This Visit's Progress    COMPLETED: Patient Stated       Current Barriers:  Care Coordination needs related to Angioplasty and limb ischemia of extremities  RNCM Clinical Goal(s):  Patient will work with the Care Management team over the next 30 days to address Transition of Care Barriers: Medication Management Support at home Provider appointments through collaboration with RN Care manager, provider, and care team.   Interventions: Evaluation of current treatment plan related to  self management and patient's adherence to plan as established by provider   Diabetes Interventions:  (Status:  Goal on track:  Yes.) Short Term Goal Assessed patient's understanding of A1c goal: <7% Reviewed medications with patient and discussed importance of medication adherence Discussed plans with patient for ongoing care management follow up and provided patient with direct contact information for care management team Lab Results  Component Value Date   HGBA1C 7.2 (H) 10/07/2022    Patient Goals/Self-Care Activities: Participate in Transition of Care Program/Attend [facility] scheduled calls Notify RN Care Manager of [facility] call rescheduling needs Take all medications as prescribed Attend all scheduled provider appointments  Follow Up Plan:  Telephone follow up appointment with care management team member scheduled for:  111/03/2022     COMPLETED: Patient Stated       Current Barriers:  Knowledge Deficits related to plan of care for management of DMII   RNCM Clinical Goal(s):  Patient will work with the Care Management team over the next 30 days to address Transition of Care Barriers: Medication Management Diet/Nutrition/Food  Resources Support at home Provider appointments verbalize basic understanding of  DMII disease process and self health management plan as evidenced by verbal feedback  through collaboration with RN Care manager, provider, and care team.   Interventions: Evaluation of current treatment plan related to  self management and patient's adherence to plan as established by provider   Diabetes Interventions:  (Status:  New goal.) Short Term Goal Assessed patient's understanding of A1c goal: <7% Provided education to patient about basic DM disease process Reviewed medications with patient and discussed importance of medication adherence Counseled on importance of regular laboratory monitoring as prescribed Reviewed scheduled/upcoming provider appointments including: 01/07/2023 with PCP Lab Results  Component Value Date   HGBA1C 7.2 (H) 10/07/2022    Patient Goals/Self-Care Activities: Participate in Transition of Care Program/Attend [facility] scheduled calls Notify RN Care Manager of [facility] call rescheduling needs Take all medications as prescribed Attend all scheduled provider appointments Perform all self care activities independently  keep appointment with eye doctor check feet daily for cuts, sores or redness take the blood sugar log to all doctor visits fill half of plate with vegetables manage portion size  Follow Up Plan:  Telephone follow up appointment with care management team member scheduled for:  January 02, 2023          The patient verbalized understanding of instructions, educational materials, and care plan provided today and agreed to receive a mailed copy of patient instructions, educational materials, and care plan.  The patient has completed the 30 day Outreach program  Please call the care guide team at [phone] if you need to  cancel or reschedule your appointment.   Please call the Suicide and Crisis Lifeline: 988 call the [place] National Suicide Prevention Lifeline:  905-013-7998 or TTY: 256-733-9076 TTY 6614456925) to talk to a trained counselor if you are experiencing a Mental Health or Behavioral Health Crisis or need someone to talk to. Deidre Ala, RN Medical illustrator [facility] [phone]

## 2023-01-06 ENCOUNTER — Encounter: Payer: Medicare HMO | Admitting: Vascular Surgery

## 2023-01-07 ENCOUNTER — Encounter: Payer: Self-pay | Admitting: Family Medicine

## 2023-01-07 ENCOUNTER — Ambulatory Visit: Payer: Medicare HMO | Admitting: Family Medicine

## 2023-01-07 ENCOUNTER — Telehealth: Payer: Self-pay | Admitting: Family Medicine

## 2023-01-07 VITALS — BP 97/64 | HR 73 | Temp 98.1°F | Ht 70.5 in | Wt 216.0 lb

## 2023-01-07 DIAGNOSIS — Z23 Encounter for immunization: Secondary | ICD-10-CM | POA: Diagnosis not present

## 2023-01-07 DIAGNOSIS — I25119 Atherosclerotic heart disease of native coronary artery with unspecified angina pectoris: Secondary | ICD-10-CM | POA: Diagnosis not present

## 2023-01-07 DIAGNOSIS — E1159 Type 2 diabetes mellitus with other circulatory complications: Secondary | ICD-10-CM

## 2023-01-07 DIAGNOSIS — L03115 Cellulitis of right lower limb: Secondary | ICD-10-CM | POA: Diagnosis not present

## 2023-01-07 DIAGNOSIS — E1169 Type 2 diabetes mellitus with other specified complication: Secondary | ICD-10-CM | POA: Diagnosis not present

## 2023-01-07 DIAGNOSIS — E1142 Type 2 diabetes mellitus with diabetic polyneuropathy: Secondary | ICD-10-CM

## 2023-01-07 DIAGNOSIS — Z9889 Other specified postprocedural states: Secondary | ICD-10-CM | POA: Diagnosis not present

## 2023-01-07 DIAGNOSIS — E785 Hyperlipidemia, unspecified: Secondary | ICD-10-CM

## 2023-01-07 DIAGNOSIS — I739 Peripheral vascular disease, unspecified: Secondary | ICD-10-CM | POA: Diagnosis not present

## 2023-01-07 DIAGNOSIS — I70222 Atherosclerosis of native arteries of extremities with rest pain, left leg: Secondary | ICD-10-CM | POA: Diagnosis not present

## 2023-01-07 DIAGNOSIS — I152 Hypertension secondary to endocrine disorders: Secondary | ICD-10-CM

## 2023-01-07 DIAGNOSIS — Z7984 Long term (current) use of oral hypoglycemic drugs: Secondary | ICD-10-CM

## 2023-01-07 LAB — BAYER DCA HB A1C WAIVED: HB A1C (BAYER DCA - WAIVED): 6.4 % — ABNORMAL HIGH (ref 4.8–5.6)

## 2023-01-07 MED ORDER — DOXYCYCLINE HYCLATE 100 MG PO TABS
100.0000 mg | ORAL_TABLET | Freq: Two times a day (BID) | ORAL | 0 refills | Status: AC
Start: 2023-01-07 — End: 2023-01-17

## 2023-01-07 MED ORDER — GABAPENTIN 300 MG PO CAPS: 900.0000 mg | ORAL_CAPSULE | Freq: Two times a day (BID) | ORAL | 0 refills | Status: DC

## 2023-01-07 NOTE — Progress Notes (Signed)
Subjective:  Patient ID: [name], male    DOB: December 18, 1950, 72 y.o.   MRN: [id number]  Patient Care Team: Sonny Masters, FNP as PCP - General (Family Medicine) Reather Littler, MD (Inactive) as Consulting Physician (Endocrinology) Newman Pies, MD as Consulting Physician (Otolaryngology) [facility] Sherrie George, MD as Consulting Physician (Ophthalmology) Quentin Angst, MD as Consulting Physician (Internal Medicine) Redge Gainer, RN as Case Manager Meshilem Machuca, Doralee Albino, FNP (Family Medicine) Antuane Eastridge, Doralee Albino, FNP as Attending Physician (Family Medicine)   Chief Complaint:  Diabetes (Patient states that is BS has been running low on and off for 20 days.  Been around 70-80. ) and Insomnia (3 month follow up )   HPI: [name]. is a 72 y.o. male presenting on 01/07/2023 for Diabetes (Patient states that is BS has been running low on and off for 20 days.  Been around 70-80. ) and Insomnia (3 month follow up )   Discussed the use of AI scribe software for clinical note transcription with the patient, who gave verbal consent to proceed.  History of Present Illness   The patient, with a history of diabetes, coronary artery disease, and peripheral vascular disease, reports that his blood sugar levels have been running low, in the seventies and eighties, for the past few days. He notes that he can feel when his sugar is low, but denies any symptoms of confusion, dizziness, weakness, or sweating. He is currently on Metformin for diabetes management, with no reported side effects.  He is taking his blood pressure and cholesterol medications without any associated side effects.   The patient also reports recent vascular surgery for bypass grafts due to a blood clot in his left leg. He underwent an ultrasound and ABI in the office, with results pending.  In addition, the patient has noticed two red spots on his right foot, which have been present for a  week. He has been applying Neosporin and iodine to the spots, but expresses concern due to a previous experience of losing a toe from a small wound.  The patient also mentions that his feet have been hurting, but the pain has slightly improved. He denies any vision changes and has an upcoming appointment with an eye doctor. He is currently on Gabapentin for pain management.  Lastly, the patient reports that he has been unable to afford his prescribed Marcelline Deist due to the high cost. He is taking Metformin and a baby aspirin daily. His A1C today was 6.4, which is the lowest it has been in ten years.          Relevant past medical, surgical, family, and social history reviewed and updated as indicated.  Allergies and medications reviewed and updated. Data reviewed: Chart in Epic.   Past Medical History:  Diagnosis Date   Allergy    seasonal   Arthritis    " all over me- back, hips, knees, hands, everywhere "   Cancer [facility])    Prostate cancer age 56; Lupron treatments followed by prostatectomy.   Cataract    left removed, forming right   Chronic constipation    x 23 yrs after prostate surgery   Diabetes mellitus without complication (HCC)    Diabetic retinal damage of both eyes (HCC)    gets shots every month   GERD (gastroesophageal reflux disease)    Hyperlipidemia    Hypertension    Myocardial infarction [facility])    age 73;  no stenting.   OSA (obstructive sleep apnea)    non-compliant with CPAP.   Sleep apnea    no cpap    Past Surgical History:  Procedure Laterality Date   ABDOMINAL AORTOGRAM W/LOWER EXTREMITY N/A 01/17/2022   Procedure: ABDOMINAL AORTOGRAM W/LOWER EXTREMITY;  Surgeon: Frost Hint, MD;  Location: [facility]VAS[facility] CV LAB;  Service: Cardiovascular;  Laterality: N/A;   ABDOMINAL AORTOGRAM W/LOWER EXTREMITY Bilateral 12/04/2022   Procedure: ABDOMINAL AORTOGRAM W/LOWER EXTREMITY;  Surgeon: Cephus Shelling, MD;  Location: [facility] INVASIVE CV LAB;  Service:  Cardiovascular;  Laterality: Bilateral;  Lysis infuaion via lt Bypass graft   AMPUTATION TOE Left 02/04/2022   Procedure: AMPUTATION  SECOND TOE;  Surgeon: Vivi Barrack, DPM;  Location: [facility] OR;  Service: Podiatry;  Laterality: Left;   APPENDECTOMY     CHOLECYSTECTOMY     FEMORAL-TIBIAL BYPASS GRAFT Left 01/29/2022   Procedure: LEFT COMMON FEMORAL BYPASS TO ANTERIOR TIBIAL ARTERY;  Surgeon: Cephus Shelling, MD;  Location: [facility] OR;  Service: Vascular;  Laterality: Left;   PERIPHERAL VASCULAR INTERVENTION  12/05/2022   Procedure: PERIPHERAL VASCULAR INTERVENTION;  Surgeon: Leonie Douglas, MD;  Location: [facility] INVASIVE CV LAB;  Service: Cardiovascular;;   PERIPHERAL VASCULAR THROMBECTOMY N/A 12/05/2022   Procedure: LYSIS RECHECK;  Surgeon: Leonie Douglas, MD;  Location: [facility] INVASIVE CV LAB;  Service: Cardiovascular;  Laterality: N/A;   PROSTATE SURGERY     VEIN HARVEST Left 01/29/2022   Procedure: HARVEST OF LEFT LEG GREAT SAPHENOUS VEIN;  Surgeon: Cephus Shelling, MD;  Location: [facility] OR;  Service: Vascular;  Laterality: Left;    Social History   Socioeconomic History   Marital status: Single    Spouse name: GF-Betty   Number of children: 2   Years of education: 11   Highest education level: Not on file  Occupational History   Occupation: supervisor    Employer: [employer]   Tobacco Use   Smoking status: Never    Passive exposure: Never   Smokeless tobacco: Former    Types: Engineer, drilling   Vaping status: Never Used  Substance and Sexual Activity   Alcohol use: Yes    Comment: social   Drug use: Not Currently   Sexual activity: Not Currently  Other Topics Concern   Not on file  Social History Narrative   Marital status: divorced, girlfriend x 1977     Children: 1      Tobacco: none; chew tobacco      Alcohol: beers on weekends      Exercise: none   Social Determinants of Health   Financial Resource Strain: Low Risk  (12/23/2022)   Overall Financial  Resource Strain (CARDIA)    Difficulty of Paying Living Expenses: Not hard at all  Food Insecurity: No Food Insecurity (12/16/2022)   Hunger Vital Sign    Worried About Running Out of Food in the Last Year: Never true    Ran Out of Food in the Last Year: Never true  Transportation Needs: No Transportation Needs (12/16/2022)   PRAPARE - Administrator, Civil Service (Medical): No    Lack of Transportation (Non-Medical): No  Physical Activity: Insufficiently Active (12/23/2022)   Exercise Vital Sign    Days of Exercise per Week: 4 days    Minutes of Exercise per Session: 10 min  Stress: No Stress Concern Present (12/23/2022)   [employer] of Occupational Health - Occupational Stress Questionnaire    Feeling of Stress :  Not at all  Social Connections: Socially Isolated (12/16/2022)   Social Connection and Isolation Panel [NHANES]    Frequency of Communication with Friends and Family: More than three times a week    Frequency of Social Gatherings with Friends and Family: More than three times a week    Attends Religious Services: Never    Database administrator or Organizations: No    Attends Banker Meetings: Never    Marital Status: Divorced  Catering manager Violence: Not At Risk (12/16/2022)   Humiliation, Afraid, Rape, and Kick questionnaire    Fear of Current or Ex-Partner: No    Emotionally Abused: No    Physically Abused: No    Sexually Abused: No    Outpatient Encounter Medications as of 01/07/2023  Medication Sig   apixaban (ELIQUIS) 5 MG TABS tablet Take 1 tablet (5 mg total) by mouth 2 (two) times daily.   aspirin 81 MG tablet Take 81 mg by mouth daily.   BAYER CONTOUR NEXT TEST test strip CHECK BLOOD SUGAR TWICE DAILY   BAYER MICROLET LANCETS lancets Check blood sugar two times daily.   Blood Glucose Monitoring Suppl (BAYER CONTOUR NEXT MONITOR) w/Device KIT Check blood sugar two times daily.   ciprofloxacin (CILOXAN) 0.3 % ophthalmic  solution Place 1 drop into both eyes See admin instructions. NSTILL ONE DROP INTO BOTH EYES 4 TIMES A DAY FOR TWO DAYS AFTER EACH MONTHLY EYE INJECTION   doxycycline (VIBRA-TABS) 100 MG tablet Take 1 tablet (100 mg total) by mouth 2 (two) times daily for 10 days. 1 po bid   fluticasone (FLONASE) 50 MCG/ACT nasal spray Place 1 spray into both nostrils daily as needed for allergies or rhinitis.   lisinopril (ZESTRIL) 5 MG tablet Take 1 tablet (5 mg total) by mouth daily.   metFORMIN (GLUCOPHAGE-XR) 500 MG 24 hr tablet Take 2 tablets (1,000 mg total) by mouth daily with breakfast AND 2 tablets (1,000 mg total) every evening. 4 tablets daily. (Patient taking differently: Take 3 tablets (1,1500 mg total) by mouth daily with breakfast AND 3 tablets (1,500 mg total) every evening. )   pantoprazole (PROTONIX) 40 MG tablet TAKE 1 TABLET (40 MG TOTAL) BY MOUTH TWICE A DAY BEFORE MEALS   rosuvastatin (CRESTOR) 20 MG tablet Take 1 tablet (20 mg total) by mouth daily.   gabapentin (NEURONTIN) 300 MG capsule Take 3 capsules (900 mg total) by mouth 2 (two) times daily.   [DISCONTINUED] dapagliflozin propanediol (FARXIGA) 5 MG TABS tablet Take 1 tablet (5 mg total) by mouth daily before breakfast. (Patient not taking: Reported on 12/02/2022)   [DISCONTINUED] gabapentin (NEURONTIN) 300 MG capsule Take 3 capsules (900 mg total) by mouth 3 (three) times daily. (Patient taking differently: Take 1,200 mg by mouth 3 (three) times daily.)   No facility-administered encounter medications on file as of 01/07/2023.    Allergies  Allergen Reactions   Cymbalta [Duloxetine Hcl] Nausea And Vomiting   Darvocet [Propoxyphene N-Acetaminophen] Hives    Pertinent ROS per HPI, otherwise unremarkable      Objective:  BP 97/64   Pulse 73   Temp 98.1 F (36.7 C) (Temporal)   Ht 5' 10.5" (1.791 m)   Wt 216 lb (98 kg)   SpO2 100%   BMI 30.55 kg/m    Wt Readings from Last 3 Encounters:  01/07/23 216 lb (98 kg)  12/05/22  217 lb 6 oz (98.6 kg)  12/01/22 214 lb (97.1 kg)    Physical Exam Vitals and  nursing note reviewed.  Constitutional:      General: He is not in acute distress.    Appearance: Normal appearance. He is not ill-appearing, toxic-appearing or diaphoretic.  HENT:     Head: Normocephalic and atraumatic.     Nose: Nose normal.     Mouth/Throat:     Mouth: Mucous membranes are moist.     Pharynx: Oropharynx is clear.  Eyes:     Conjunctiva/sclera: Conjunctivae normal.     Pupils: Pupils are equal, round, and reactive to light.  Cardiovascular:     Rate and Rhythm: Normal rate and regular rhythm.     Pulses: Normal pulses.     Heart sounds: Normal heart sounds.  Pulmonary:     Effort: Pulmonary effort is normal.     Breath sounds: Normal breath sounds.  Musculoskeletal:     Cervical back: Neck supple.     Right lower leg: No edema.     Left lower leg: No edema.  Skin:    General: Skin is warm and dry.     Capillary Refill: Capillary refill takes less than 2 seconds.       Neurological:     General: No focal deficit present.     Mental Status: He is alert and oriented to person, place, and time.  Psychiatric:        Mood and Affect: Mood normal.        Behavior: Behavior normal.        Thought Content: Thought content normal.        Judgment: Judgment normal.    Physical Exam   NEUROLOGICAL: Sensation to touch intact on left and right lower leg. SKIN: Red spots on right ankle, cellulitis above right ankle.        Results for orders placed or performed during the hospital encounter of 01/01/23  VAS [place] ABI WITH/WO TBI  Result Value Ref Range   Right ABI [place]    Left ABI 1.09        Pertinent labs & imaging results that were available during my care of the patient were reviewed by me and considered in my medical decision making.  Assessment & Plan:  Zae was seen today for diabetes and insomnia.  Diagnoses and all orders for this visit:  Type 2 diabetes mellitus with  other specified complication, without long-term current use of insulin (HCC) -     Bayer DCA Hb A1c Waived -     CMP14+EGFR -     CBC with Differential/Platelet -     Lipid panel -     Thyroid Panel With TSH  Diabetic polyneuropathy associated with type 2 diabetes mellitus (HCC) -     gabapentin (NEURONTIN) 300 MG capsule; Take 3 capsules (900 mg total) by mouth 2 (two) times daily. -     CMP14+EGFR  Hypertension associated with type 2 diabetes mellitus (HCC) -     CMP14+EGFR -     CBC with Differential/Platelet -     Lipid panel -     Thyroid Panel With TSH  Hyperlipidemia associated with type 2 diabetes mellitus (HCC) -     CMP14+EGFR -     Lipid panel  Critical limb ischemia of left lower extremity (HCC) -     CBC with Differential/Platelet  Coronary artery disease involving native coronary artery of native heart with angina pectoris (HCC) -     CMP14+EGFR -     CBC with Differential/Platelet -  Lipid panel  PAD (peripheral artery disease) (HCC) -     CMP14+EGFR -     CBC with Differential/Platelet -     Lipid panel  Status post peripheral artery bypass -     CBC with Differential/Platelet -     Lipid panel  Cellulitis of right lower leg -     doxycycline (VIBRA-TABS) 100 MG tablet; Take 1 tablet (100 mg total) by mouth 2 (two) times daily for 10 days. 1 po bid -     CBC with Differential/Platelet  Encounter for immunization -     Flu Vaccine Trivalent High Dose (Fluad)     Assessment and Plan    Type 2 Diabetes Mellitus Recent hypoglycemic episodes with blood glucose in the 70s and 80s. Patient reports feeling symptomatic during these episodes. A1c is 6.4, the lowest it has been in 10 years. Patient is currently on Metformin and was prescribed Farxiga, but has not been taking it due to cost. -Continue Metformin. -Refer to clinical pharmacist to assist with obtaining Farxiga at a lower cost. -Check blood glucose regularly and report any further hypoglycemic  episodes.  Peripheral Vascular Disease Recent bypass grafts and treatment for a blood clot in the left leg. Patient reports some pain in the feet, but it has been improving. Awaiting results of recent ultrasound and ABI. -Continue current treatment plan. -Follow up with vascular surgeon on 02/12/2023 for results of recent tests.  Cellulitis, Right Lower Leg Red spots on the right foot and lower leg, present for about a week. Patient has been applying Neosporin and iodine. -Prescribe antibiotics for cellulitis. -Advise patient to clean the area with soap and water, and apply CeraVe healing ointment or Aquaphor. Discontinue use of Neosporin. -Refer to podiatrist for further evaluation and management.  Coronary Artery Disease No recent symptoms reported. Last visit with cardiologist is unclear. -Continue current treatment plan. -Recommend follow-up with cardiologist.  General Health Maintenance -Administer influenza vaccine today. -Order blood work to check hemoglobin, hematocrit, and calcium levels. -Follow-up visit in three months.          Continue all other maintenance medications.  Follow up plan: Return in about 3 months (around 04/09/2023) for DM.   Continue healthy lifestyle choices, including diet (rich in fruits, vegetables, and lean proteins, and low in salt and simple carbohydrates) and exercise (at least 30 minutes of moderate physical activity daily).  Educational handout given for DM  The above assessment and management plan was discussed with the patient. The patient verbalized understanding of and has agreed to the management plan. Patient is aware to call the clinic if they develop any new symptoms or if symptoms persist or worsen. Patient is aware when to return to the clinic for a follow-up visit. Patient educated on when it is appropriate to go to the emergency department.   Kari Baars, FNP-C [facility] [phone]

## 2023-01-07 NOTE — Patient Instructions (Addendum)
CeraVe Healing Ointment   Continue to monitor your blood sugars as we discussed and record them. Bring the log to your next appointment.  Take your medications as directed.    Goal Blood glucose:    Fasting (before meals) = 80 to 130   Within 2 hours of eating = less than 180   Understanding your Hemoglobin A1c: 6.4     Diabetes Mellitus and Nutrition    I think that you would greatly benefit from seeing a nutritionist. If this is something you are interested in, please call Dr Gerilyn Pilgrim at [phone] to schedule an appointment.   When you have diabetes (diabetes mellitus), it is very important to have healthy eating habits because your blood sugar (glucose) levels are greatly affected by what you eat and drink. Eating healthy foods in the appropriate amounts, at about the same times every day, can help you: Control your blood glucose. Lower your risk of heart disease. Improve your blood pressure. Reach or maintain a healthy weight.  Every person with diabetes is different, and each person has different needs for a meal plan. Your health care provider may recommend that you work with a diet and nutrition specialist (dietitian) to make a meal plan that is best for you. Your meal plan may vary depending on factors such as: The calories you need. The medicines you take. Your weight. Your blood glucose, blood pressure, and cholesterol levels. Your activity level. Other health conditions you have, such as heart or kidney disease.  How do carbohydrates affect me? Carbohydrates affect your blood glucose level more than any other type of food. Eating carbohydrates naturally increases the amount of glucose in your blood. Carbohydrate counting is a method for keeping track of how many carbohydrates you eat. Counting carbohydrates is important to keep your blood glucose at a healthy level, especially if you use insulin or take certain oral diabetes medicines. It is important to know how many  carbohydrates you can safely have in each meal. This is different for every person. Your dietitian can help you calculate how many carbohydrates you should have at each meal and for snack. Foods that contain carbohydrates include: Bread, cereal, rice, pasta, and crackers. Potatoes and corn. Peas, beans, and lentils. Milk and yogurt. Fruit and juice. Desserts, such as cakes, cookies, ice cream, and candy.  How does alcohol affect me? Alcohol can cause a sudden decrease in blood glucose (hypoglycemia), especially if you use insulin or take certain oral diabetes medicines. Hypoglycemia can be a life-threatening condition. Symptoms of hypoglycemia (sleepiness, dizziness, and confusion) are similar to symptoms of having too much alcohol. If your health care provider says that alcohol is safe for you, follow these guidelines: Limit alcohol intake to no more than 1 drink per day for nonpregnant women and 2 drinks per day for men. One drink equals 12 oz of beer, 5 oz of wine, or 1 oz of hard liquor. Do not drink on an empty stomach. Keep yourself hydrated with water, diet soda, or unsweetened iced tea. Keep in mind that regular soda, juice, and other mixers may contain a lot of sugar and must be counted as carbohydrates.  What are tips for following this plan?  Reading food labels Start by checking the serving size on the label. The amount of calories, carbohydrates, fats, and other nutrients listed on the label are based on one serving of the food. Many foods contain more than one serving per package. Check the total grams (g) of carbohydrates in  one serving. You can calculate the number of servings of carbohydrates in one serving by dividing the total carbohydrates by 15. For example, if a food has 30 g of total carbohydrates, it would be equal to 2 servings of carbohydrates. Check the number of grams (g) of saturated and trans fats in one serving. Choose foods that have low or no amount of these  fats. Check the number of milligrams (mg) of sodium in one serving. Most people should limit total sodium intake to less than 2,300 mg per day. Always check the nutrition information of foods labeled as "low-fat" or "nonfat". These foods may be higher in added sugar or refined carbohydrates and should be avoided. Talk to your dietitian to identify your daily goals for nutrients listed on the label.  Shopping Avoid buying canned, premade, or processed foods. These foods tend to be high in fat, sodium, and added sugar. Shop around the outside edge of the grocery store. This includes fresh fruits and vegetables, bulk grains, fresh meats, and fresh dairy.  Cooking Use low-heat cooking methods, such as baking, instead of high-heat cooking methods like deep frying. Cook using healthy oils, such as olive, canola, or sunflower oil. Avoid cooking with butter, cream, or high-fat meats.  Meal planning Eat meals and snacks regularly, preferably at the same times every day. Avoid going long periods of time without eating. Eat foods high in fiber, such as fresh fruits, vegetables, beans, and whole grains. Talk to your dietitian about how many servings of carbohydrates you can eat at each meal. Eat 4-6 ounces of lean protein each day, such as lean meat, chicken, fish, eggs, or tofu. 1 ounce is equal to 1 ounce of meat, chicken, or fish, 1 egg, or 1/4 cup of tofu. Eat some foods each day that contain healthy fats, such as avocado, nuts, seeds, and fish.  Lifestyle  Check your blood glucose regularly. Exercise at least 30 minutes 5 or more days each week, or as told by your health care provider. Take medicines as told by your health care provider. Do not use any products that contain nicotine or tobacco, such as cigarettes and e-cigarettes. If you need help quitting, ask your health care provider. Work with a Veterinary surgeon or diabetes educator to identify strategies to manage stress and any emotional and social  challenges.  What are some questions to ask my health care provider? Do I need to meet with a diabetes educator? Do I need to meet with a dietitian? What number can I call if I have questions? When are the best times to check my blood glucose?  Where to find more information: American Diabetes Association: diabetes.org/food-and-fitness/food Academy of Nutrition and Dietetics: https://www.vargas.com/ [employer] of Diabetes and Digestive and Kidney Diseases (NIH): FindJewelers.cz  Summary A healthy meal plan will help you control your blood glucose and maintain a healthy lifestyle. Working with a diet and nutrition specialist (dietitian) can help you make a meal plan that is best for you. Keep in mind that carbohydrates and alcohol have immediate effects on your blood glucose levels. It is important to count carbohydrates and to use alcohol carefully. This information is not intended to replace advice given to you by your health care provider. Make sure you discuss any questions you have with your health care provider. Document Released: 11/14/2004 Document Revised: 03/24/2016 Document Reviewed: 03/24/2016 Elsevier Interactive Patient Education  [employer].

## 2023-01-08 LAB — CMP14+EGFR
ALT: 15 [IU]/L (ref 0–44)
AST: 20 [IU]/L (ref 0–40)
Albumin: 4.3 g/dL (ref 3.9–4.9)
Alkaline Phosphatase: 89 IU/L (ref 44–121)
BUN/Creatinine Ratio: 14 (ref 10–24)
BUN: 14 mg/dL (ref 8–27)
Bilirubin Total: 0.9 mg/dL (ref 0.0–1.2)
CO2: 24 mmol/L (ref 20–29)
Calcium: 9.7 mg/dL (ref 8.6–10.2)
Chloride: 105 mmol/L (ref 96–106)
Creatinine, Ser: 0.99 mg/dL (ref 0.76–1.27)
Globulin, Total: 2.1 g/dL (ref 1.5–4.5)
Glucose: 78 mg/dL (ref 70–99)
Potassium: 3.7 mmol/L (ref 3.5–5.2)
Sodium: 144 mmol/L (ref 134–144)
Total Protein: 6.4 g/dL (ref 6.0–8.5)
eGFR: 82 mL/min/{1.73_m2} (ref >59)

## 2023-01-08 LAB — CBC WITH DIFFERENTIAL/PLATELET
Basophils Absolute: 0.1 10*3/uL (ref 0.0–0.2)
Basos: 1 %
EOS (ABSOLUTE): 0.3 10*3/uL (ref 0.0–0.4)
Eos: 4 %
Hematocrit: 40.2 % (ref 37.5–51.0)
Hemoglobin: 12.8 g/dL — ABNORMAL LOW (ref 13.0–17.7)
Immature Grans (Abs): 0 10*3/uL (ref 0.0–0.1)
Immature Granulocytes: 0 %
Lymphocytes Absolute: 3.2 10*3/uL — ABNORMAL HIGH (ref 0.7–3.1)
Lymphs: 41 %
MCH: 28.4 pg (ref 26.6–33.0)
MCHC: 31.8 g/dL (ref 31.5–35.7)
MCV: 89 fL (ref 79–97)
Monocytes Absolute: 0.7 10*3/uL (ref 0.1–0.9)
Monocytes: 9 %
Neutrophils Absolute: 3.5 10*3/uL (ref 1.4–7.0)
Neutrophils: 45 %
Platelets: 162 10*3/uL (ref 150–450)
RBC: 4.5 x10E6/uL (ref 4.14–5.80)
RDW: 13.3 % (ref 11.6–15.4)
WBC: 7.8 10*3/uL (ref 3.4–10.8)

## 2023-01-08 LAB — LIPID PANEL
Chol/HDL Ratio: 2.2 ratio (ref 0.0–5.0)
Cholesterol, Total: 93 mg/dL — ABNORMAL LOW (ref 100–199)
HDL: 43 mg/dL (ref >39)
LDL Chol Calc (NIH): 29 mg/dL (ref 0–99)
Triglycerides: 116 mg/dL (ref 0–149)
VLDL Cholesterol Cal: 21 mg/dL (ref 5–40)

## 2023-01-08 LAB — THYROID PANEL WITH TSH
Free Thyroxine Index: 2.2 (ref 1.2–4.9)
T3 Uptake Ratio: 28 % (ref 24–39)
T4, Total: 8 ug/dL (ref 4.5–12.0)
TSH: 7.4 u[IU]/mL — ABNORMAL HIGH (ref 0.450–4.500)

## 2023-01-13 ENCOUNTER — Encounter: Payer: Medicare HMO | Admitting: Vascular Surgery

## 2023-01-16 ENCOUNTER — Ambulatory Visit: Payer: Medicare HMO | Admitting: Podiatry

## 2023-01-16 ENCOUNTER — Encounter: Payer: Self-pay | Admitting: Podiatry

## 2023-01-16 DIAGNOSIS — M79675 Pain in left toe(s): Secondary | ICD-10-CM | POA: Diagnosis not present

## 2023-01-16 DIAGNOSIS — M79674 Pain in right toe(s): Secondary | ICD-10-CM

## 2023-01-16 DIAGNOSIS — M792 Neuralgia and neuritis, unspecified: Secondary | ICD-10-CM

## 2023-01-16 DIAGNOSIS — B351 Tinea unguium: Secondary | ICD-10-CM | POA: Diagnosis not present

## 2023-01-16 MED ORDER — PREGABALIN 150 MG PO CAPS: 150.0000 mg | ORAL_CAPSULE | Freq: Two times a day (BID) | ORAL | 0 refills | Status: DC

## 2023-01-16 NOTE — Progress Notes (Signed)
Subjective: [name] presents today referred by Sonny Masters, FNP for diabetic foot evaluation.  Patient relates many year history of diabetes.  Patient denies any history of foot wounds.  Patient has history of numbness, tingling, burning, pins/needles sensations.  He also states gabapentin does not help with his pain.  He would like to discuss treatment options different to try  Past Medical History:  Diagnosis Date   Allergy    seasonal   Arthritis    " all over me- back, hips, knees, hands, everywhere "   Cancer [facility])    Prostate cancer age 7; Lupron treatments followed by prostatectomy.   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   OSA (obstructive sleep apnea)    non-compliant with CPAP.   Sleep apnea    no cpap    Patient Active Problem List   Diagnosis Date Noted   Critical limb ischemia of left lower extremity (HCC) 12/04/2022   Diabetic polyneuropathy associated with type 2 diabetes mellitus (HCC) 03/05/2022   Hyperlipidemia associated with type 2 diabetes mellitus (HCC) 03/05/2022   S/P amputation of lesser toe, left (HCC) 03/05/2022   Coronary artery disease involving native coronary artery of native heart with angina pectoris (HCC) 03/05/2022   Status post peripheral artery bypass 01/29/2022   PAD (peripheral artery disease) (HCC) 01/29/2022   Hypertension associated with type 2 diabetes mellitus (HCC) 12/06/2021   Alcohol abuse 12/18/2014   Bifascicular block 07/18/2014   OSA (obstructive sleep apnea) 05/21/2014   DM (diabetes mellitus) (HCC) 04/27/2012    Past Surgical History:  Procedure Laterality Date   ABDOMINAL AORTOGRAM W/LOWER  EXTREMITY N/A 01/17/2022   Procedure: ABDOMINAL AORTOGRAM W/LOWER EXTREMITY;  Surgeon: Frost Hint, MD;  Location: [facility] INVASIVE CV LAB;  Service: Cardiovascular;  Laterality: N/A;   ABDOMINAL AORTOGRAM W/LOWER EXTREMITY Bilateral 12/04/2022   Procedure: ABDOMINAL AORTOGRAM W/LOWER EXTREMITY;  Surgeon: Cephus Shelling, MD;  Location: [facility] INVASIVE CV LAB;  Service: Cardiovascular;  Laterality: Bilateral;  Lysis infuaion via lt Bypass graft   AMPUTATION TOE Left 02/04/2022   Procedure: AMPUTATION  SECOND TOE;  Surgeon: Vivi Barrack, DPM;  Location: [facility] OR;  Service: Podiatry;  Laterality: Left;   APPENDECTOMY     CHOLECYSTECTOMY     FEMORAL-TIBIAL BYPASS GRAFT Left 01/29/2022   Procedure: LEFT COMMON FEMORAL BYPASS TO ANTERIOR TIBIAL ARTERY;  Surgeon: Cephus Shelling, MD;  Location: [facility] OR;  Service: Vascular;  Laterality: Left;   PERIPHERAL VASCULAR INTERVENTION  12/05/2022   Procedure: PERIPHERAL VASCULAR INTERVENTION;  Surgeon: Leonie Douglas, MD;  Location: [facility] INVASIVE CV LAB;  Service: Cardiovascular;;   PERIPHERAL VASCULAR THROMBECTOMY N/A 12/05/2022   Procedure: LYSIS RECHECK;  Surgeon: Leonie Douglas, MD;  Location: [facility] INVASIVE CV LAB;  Service: Cardiovascular;  Laterality: N/A;   PROSTATE SURGERY     VEIN HARVEST Left 01/29/2022   Procedure: HARVEST OF LEFT LEG GREAT SAPHENOUS VEIN;  Surgeon: Cephus Shelling, MD;  Location: [facility] OR;  Service: Vascular;  Laterality: Left;    Current Outpatient Medications on File Prior to Visit  Medication Sig Dispense Refill  apixaban (ELIQUIS) 5 MG TABS tablet Take 1 tablet (5 mg total) by mouth 2 (two) times daily. 60 tablet 3   aspirin 81 MG tablet Take 81 mg by mouth daily.     BAYER CONTOUR NEXT TEST test strip CHECK BLOOD SUGAR TWICE DAILY 100 each 5   BAYER MICROLET LANCETS lancets Check blood sugar two times daily. 100 each 5   Blood Glucose Monitoring Suppl (BAYER CONTOUR NEXT MONITOR) w/Device KIT Check blood sugar  two times daily. 1 kit 2   ciprofloxacin (CILOXAN) 0.3 % ophthalmic solution Place 1 drop into both eyes See admin instructions. NSTILL ONE DROP INTO BOTH EYES 4 TIMES A DAY FOR TWO DAYS AFTER EACH MONTHLY EYE INJECTION     doxycycline (VIBRA-TABS) 100 MG tablet Take 1 tablet (100 mg total) by mouth 2 (two) times daily for 10 days. 1 po bid 20 tablet 0   fluticasone (FLONASE) 50 MCG/ACT nasal spray Place 1 spray into both nostrils daily as needed for allergies or rhinitis.     gabapentin (NEURONTIN) 300 MG capsule Take 3 capsules (900 mg total) by mouth 2 (two) times daily. 540 capsule 0   lisinopril (ZESTRIL) 5 MG tablet Take 1 tablet (5 mg total) by mouth daily. 90 tablet 3   metFORMIN (GLUCOPHAGE-XR) 500 MG 24 hr tablet Take 2 tablets (1,000 mg total) by mouth daily with breakfast AND 2 tablets (1,000 mg total) every evening. 4 tablets daily. (Patient taking differently: Take 3 tablets (1,1500 mg total) by mouth daily with breakfast AND 3 tablets (1,500 mg total) every evening. ) 360 tablet 1   pantoprazole (PROTONIX) 40 MG tablet TAKE 1 TABLET (40 MG TOTAL) BY MOUTH TWICE A DAY BEFORE MEALS 180 tablet 0   rosuvastatin (CRESTOR) 20 MG tablet Take 1 tablet (20 mg total) by mouth daily. 30 tablet 11   No current facility-administered medications on file prior to visit.     Allergies  Allergen Reactions   Cymbalta [Duloxetine Hcl] Nausea And Vomiting   Darvocet [Propoxyphene N-Acetaminophen] Hives    Social History   Occupational History   Occupation: Event organiser: [employer]   Tobacco Use   Smoking status: Never    Passive exposure: Never   Smokeless tobacco: Former    Types: Engineer, drilling   Vaping status: Never Used  Substance and Sexual Activity   Alcohol use: Yes    Comment: social   Drug use: Not Currently   Sexual activity: Not Currently    Family History  Problem Relation Age of Onset   Cancer Mother        leukemia   Diabetes Mother    Cancer  Father 68       lung cancer with mets   Colon cancer Maternal Grandmother    Heart disease Maternal Grandmother    Heart disease Maternal Grandfather    Heart disease Paternal Grandmother    Cancer Paternal Grandfather        lung, prostate   Esophageal cancer Neg Hx    Colon polyps Neg Hx    Stomach cancer Neg Hx    Rectal cancer Neg Hx     Immunization History  Administered Date(s) Administered   Fluad Trivalent(High Dose 65+) 01/07/2023   Influenza Split 11/20/2020   Influenza, High Dose Seasonal PF 11/24/2019   Influenza,inj,Quad PF,6+ Mos 11/04/2016, 12/27/2017, 11/09/2018   Influenza-Unspecified 11/16/2015, 12/27/2017, 12/27/2017, 12/12/2020, 12/04/2021   Moderna Sars-Covid-2 Vaccination 05/13/2019, 06/15/2019, 02/23/2020, 12/20/2020   PNEUMOCOCCAL  CONJUGATE-20 10/07/2022   Pneumococcal Polysaccharide-23 08/04/2015, 11/09/2018   Td 03/07/1995   Td (Adult),5 Lf Tetanus Toxid, Preservative Free 03/07/1995   Tdap 09/26/2012, 07/14/2017   Varicella 12/04/2021   Zoster Recombinant(Shingrix) 12/04/2021    Review of systems: Positive Findings in bold print.  Constitutional:  chills, fatigue, fever, sweats, weight change Communication: Nurse, learning disability, sign Presenter, broadcasting, hand writing, iPad/Android device Head: headaches, head injury Eyes: changes in vision, eye pain, glaucoma, cataracts, macular degeneration, diplopia, glare,  light sensitivity, eyeglasses or contacts, blindness Ears nose mouth throat: hearing impaired, hearing aids,  ringing in ears, deaf, sign language,  vertigo, nosebleeds,  rhinitis,  cold sores, snoring, swollen glands Cardiovascular: HTN, edema, arrhythmia, pacemaker in place, defibrillator in place, chest pain/tightness, chronic anticoagulation, blood clot, heart failure, MI Peripheral Vascular: leg cramps, varicose veins, blood clots, lymphedema, varicosities Respiratory:  asthma, difficulty breathing, denies congestion, SOB, wheezing, cough,  emphysema Gastrointestinal: change in appetite or weight, abdominal pain, constipation, diarrhea, nausea, vomiting, vomiting blood, change in bowel habits, abdominal pain, jaundice, rectal bleeding, hemorrhoids, GERD Genitourinary:  nocturia,  pain on urination, polyuria,  blood in urine, Foley catheter, urinary urgency, ESRD on hemodialysis Musculoskeletal: amputation, cramping, stiff joints, painful joints, decreased joint motion, fractures, OA, gout, hemiplegia, paraplegia, uses cane, wheelchair bound, uses walker, uses rollator Skin: +changes in toenails, color change, dryness, itching, mole changes,  rash, wound(s) Neurological: headaches, numbness in feet, paresthesias in feet, burning in feet, fainting,  seizures, change in speech, migraines, memory problems/poor historian, cerebral palsy, weakness, paralysis, CVA, TIA Endocrine: diabetes, hypothyroidism, hyperthyroidism,  goiter, dry mouth, flushing, heat intolerance, cold intolerance,  excessive thirst, denies polyuria,  nocturia Hematological:  easy bleeding, excessive bleeding, easy bruising, enlarged lymph nodes, on long term blood thinner, history of past transusions Allergy/immunological:  hives, eczema, frequent infections, multiple drug allergies, seasonal allergies, transplant recipient, multiple food allergies Psychiatric:  anxiety, depression, mood disorder, suicidal ideations, hallucinations, insomnia  Objective: There were no vitals filed for this visit. Vascular Examination: Capillary refill time less than 3 seconds x 10 digits.  Dorsalis pedis pulses palpable 2 out of 4.  Posterior tibial pulses were 2 out of 4.  Digital hair not present x 10 digits.  Skin temperature gradient WNL b/l.  Dermatological Examination: Skin with normal turgor, texture and tone b/l  Toenails 1-5 b/l discolored, thick, dystrophic with subungual debris and pain with palpation to nailbeds due to thickness of nails.  Musculoskeletal: Muscle  strength 5/5 to all LE muscle groups.  Neurological: Sensation intact with 10 gram monofilament.  Vibratory sensation intact.  Assessment: NIDDM Encounter for diabetic foot examination Neuropathy diabetic  Plan: Discussed diabetic foot care principles. Literature dispensed on today. Patient to continue soft, supportive shoe gear daily. Patient to report any pedal injuries to medical professional immediately. Follow up one year. Patient/POA to call should there be a concern in the interim. I switched his medication to Lyrica from gabapentin to help with neuropathy pain   Onychomycosis with pain  -Nails palliatively debrided as below. -Educated on self-care  Procedure: Nail Debridement Rationale: pain  Type of Debridement: manual, sharp debridement. Instrumentation: Nail nipper, rotary burr. Number of Nails: 10  Procedures and Treatment: Consent by patient was obtained for treatment procedures. The patient understood the discussion of treatment and procedures well. All questions were answered thoroughly reviewed. Debridement of mycotic and hypertrophic toenails, 1 through 5 bilateral and clearing of subungual debris. No ulceration, no infection noted.  Return Visit-Office Procedure: Patient instructed to return to the office for  a follow up visit 3 months for continued evaluation and treatment.  Nicholes Rough, DPM    No follow-ups on file.

## 2023-01-22 ENCOUNTER — Encounter: Payer: Medicare HMO | Admitting: Family Medicine

## 2023-01-23 ENCOUNTER — Encounter (INDEPENDENT_AMBULATORY_CARE_PROVIDER_SITE_OTHER): Payer: Medicare HMO | Admitting: Ophthalmology

## 2023-01-23 DIAGNOSIS — Z7984 Long term (current) use of oral hypoglycemic drugs: Secondary | ICD-10-CM | POA: Diagnosis not present

## 2023-01-23 DIAGNOSIS — H43813 Vitreous degeneration, bilateral: Secondary | ICD-10-CM

## 2023-01-23 DIAGNOSIS — E113313 Type 2 diabetes mellitus with moderate nonproliferative diabetic retinopathy with macular edema, bilateral: Secondary | ICD-10-CM

## 2023-01-23 DIAGNOSIS — H35033 Hypertensive retinopathy, bilateral: Secondary | ICD-10-CM | POA: Diagnosis not present

## 2023-01-23 DIAGNOSIS — I10 Essential (primary) hypertension: Secondary | ICD-10-CM | POA: Diagnosis not present

## 2023-02-01 NOTE — Progress Notes (Unsigned)
Patient name: [name] MRN: [id number] DOB: [birth year] Sex: male  REASON FOR VISIT: Follow-up  HPI: [name] is a 72 y.o. male with multiple comorbidities that presents for hospital follow-up.  Patient previously underwent a left common femoral to anterior tibial bypass with ipsilateral nonreversed great saphenous vein on 01/29/22 for CLI with tissue loss.  Ultimately his wounds healed.  Most recently was undergoing angiogram on 12/04/2022 to evaluate his right leg PAD and was found to have an occluded left leg bypass.  We then stuck the contralateral groin and performed left leg lytics.  He underwent left DP AT angioplasty on the lytics takeback on 12/05/2022.  Duplex on 01/01/2023 after recent intervention shows patent left leg bypass with no stenosis.  Remains on aspirin and Eliquis.  States he is getting treatment for his neuropathy in his legs.  Past Medical History:  Diagnosis Date   Allergy    seasonal   Arthritis    " all over me- back, hips, knees, hands, everywhere "   Cancer [facility])    Prostate cancer age 78; Lupron treatments followed by prostatectomy.   Cataract    left removed, forming right   Chronic constipation    x 23 yrs after prostate surgery   Diabetes mellitus without complication (HCC)    Diabetic retinal damage of both eyes (HCC)    gets shots every month   GERD (gastroesophageal reflux disease)    Hyperlipidemia    Hypertension    Myocardial infarction [facility])    age 49; no stenting.   OSA (obstructive sleep apnea)    non-compliant with CPAP.   Sleep apnea    no cpap    Past Surgical History:  Procedure Laterality Date   ABDOMINAL AORTOGRAM W/LOWER EXTREMITY N/A 01/17/2022   Procedure: ABDOMINAL AORTOGRAM W/LOWER EXTREMITY;  Surgeon: Chuck Hint, MD;  Location: [facility] INVASIVE CV LAB;  Service: Cardiovascular;  Laterality: N/A;   ABDOMINAL AORTOGRAM W/LOWER EXTREMITY Bilateral 12/04/2022   Procedure: ABDOMINAL AORTOGRAM W/LOWER  EXTREMITY;  Surgeon: Cephus Shelling, MD;  Location: [facility] INVASIVE CV LAB;  Service: Cardiovascular;  Laterality: Bilateral;  Lysis infuaion via lt Bypass graft   AMPUTATION TOE Left 02/04/2022   Procedure: AMPUTATION  SECOND TOE;  Surgeon: Vivi Barrack, DPM;  Location: [facility] OR;  Service: Podiatry;  Laterality: Left;   APPENDECTOMY     CHOLECYSTECTOMY     FEMORAL-TIBIAL BYPASS GRAFT Left 01/29/2022   Procedure: LEFT COMMON FEMORAL BYPASS TO ANTERIOR TIBIAL ARTERY;  Surgeon: Cephus Shelling, MD;  Location: [facility] OR;  Service: Vascular;  Laterality: Left;   PERIPHERAL VASCULAR INTERVENTION  12/05/2022   Procedure: PERIPHERAL VASCULAR INTERVENTION;  Surgeon: Leonie Douglas, MD;  Location: [facility] INVASIVE CV LAB;  Service: Cardiovascular;;   PERIPHERAL VASCULAR THROMBECTOMY N/A 12/05/2022   Procedure: LYSIS RECHECK;  Surgeon: Leonie Douglas, MD;  Location: [facility] INVASIVE CV LAB;  Service: Cardiovascular;  Laterality: N/A;   PROSTATE SURGERY     VEIN HARVEST Left 01/29/2022   Procedure: HARVEST OF LEFT LEG GREAT SAPHENOUS VEIN;  Surgeon: Cephus Shelling, MD;  Location: [facility] OR;  Service: Vascular;  Laterality: Left;    Family History  Problem Relation Age of Onset   Cancer Mother        leukemia   Diabetes Mother    Cancer Father 20       lung cancer with mets   Colon cancer Maternal Grandmother    Heart disease Maternal Grandmother  Heart disease Maternal Grandfather    Heart disease Paternal Grandmother    Cancer Paternal Grandfather        lung, prostate   Esophageal cancer Neg Hx    Colon polyps Neg Hx    Stomach cancer Neg Hx    Rectal cancer Neg Hx     SOCIAL HISTORY: Social History   Tobacco Use   Smoking status: Never    Passive exposure: Never   Smokeless tobacco: Former    Types: Chew  Substance Use Topics   Alcohol use: Yes    Comment: social    Allergies  Allergen Reactions   Cymbalta [Duloxetine Hcl] Nausea And Vomiting   Darvocet [Propoxyphene  N-Acetaminophen] Hives    Current Outpatient Medications  Medication Sig Dispense Refill   apixaban (ELIQUIS) 5 MG TABS tablet Take 1 tablet (5 mg total) by mouth 2 (two) times daily. 60 tablet 3   aspirin 81 MG tablet Take 81 mg by mouth daily.     BAYER CONTOUR NEXT TEST test strip CHECK BLOOD SUGAR TWICE DAILY 100 each 5   BAYER MICROLET LANCETS lancets Check blood sugar two times daily. 100 each 5   Blood Glucose Monitoring Suppl (BAYER CONTOUR NEXT MONITOR) w/Device KIT Check blood sugar two times daily. 1 kit 2   ciprofloxacin (CILOXAN) 0.3 % ophthalmic solution Place 1 drop into both eyes See admin instructions. NSTILL ONE DROP INTO BOTH EYES 4 TIMES A DAY FOR TWO DAYS AFTER EACH MONTHLY EYE INJECTION     fluticasone (FLONASE) 50 MCG/ACT nasal spray Place 1 spray into both nostrils daily as needed for allergies or rhinitis.     gabapentin (NEURONTIN) 300 MG capsule Take 3 capsules (900 mg total) by mouth 2 (two) times daily. 540 capsule 0   lisinopril (ZESTRIL) 5 MG tablet Take 1 tablet (5 mg total) by mouth daily. 90 tablet 3   metFORMIN (GLUCOPHAGE-XR) 500 MG 24 hr tablet Take 2 tablets (1,000 mg total) by mouth daily with breakfast AND 2 tablets (1,000 mg total) every evening. 4 tablets daily. (Patient taking differently: Take 3 tablets (1,1500 mg total) by mouth daily with breakfast AND 3 tablets (1,500 mg total) every evening. ) 360 tablet 1   pantoprazole (PROTONIX) 40 MG tablet TAKE 1 TABLET (40 MG TOTAL) BY MOUTH TWICE A DAY BEFORE MEALS 180 tablet 0   pregabalin (LYRICA) 150 MG capsule Take 1 capsule (150 mg total) by mouth 2 (two) times daily. 30 capsule 0   rosuvastatin (CRESTOR) 20 MG tablet Take 1 tablet (20 mg total) by mouth daily. 30 tablet 11   No current facility-administered medications for this visit.    REVIEW OF SYSTEMS:  [X]  denotes positive finding, [ ]  denotes negative finding Cardiac  Comments:  Chest pain or chest pressure:    Shortness of breath upon  exertion:    Short of breath when lying flat:    Irregular heart rhythm:        Vascular    Pain in calf, thigh, or hip brought on by ambulation:    Pain in feet at night that wakes you up from your sleep:     Blood clot in your veins:    Leg swelling:         Pulmonary    Oxygen at home:    Productive cough:     Wheezing:         Neurologic    Sudden weakness in arms or legs:     Sudden  numbness in arms or legs:     Sudden onset of difficulty speaking or slurred speech:    Temporary loss of vision in one eye:     Problems with dizziness:         Gastrointestinal    Blood in stool:     Vomited blood:         Genitourinary    Burning when urinating:     Blood in urine:        Psychiatric    Major depression:         Hematologic    Bleeding problems:    Problems with blood clotting too easily:        Skin    Rashes or ulcers:        Constitutional    Fever or chills:      PHYSICAL EXAM: There were no vitals filed for this visit.  GENERAL: The patient is a well-nourished male, in no acute distress. The vital signs are documented above. CARDIAC: There is a regular rate and rhythm.  VASCULAR:  Femoral pulse palpable Palpable pulse in the left common femoral to AT bypass Left DP palpable No lower extremity tissue loss PULMONARY: No respiratory distress ABDOMEN: Soft and non-tender/ MUSCULOSKELETAL: There are no major deformities or cyanosis. NEUROLOGIC: No focal weakness or paresthesias are detected. PSYCHIATRIC: The patient has a normal affect.  DATA:   Left leg arterial duplex from 01/01/2023 shows patent bypass with no stenosis  ABI 01/01/2023 noncompressible on the right and 1.09 on the left triphasic  Assessment/Plan:  72 year old male presents for hospital follow-up after recent thrombolysis of occluded left common femoral to anterior tibial bypass on 12/04/2022.  On the takeback on 12/05/2022 for lytics check he required left AT and DP angioplasty.   Bypass was previously done on 01/19/2022 for CLI with tissue loss and his left second toe amputation has since healed.  Today he has a palpable pulse in the left leg bypass graft and a palpable DP pulse in the foot.  Discussed he needs to stay on aspirin and Eliquis plus statin therapy.  I did refill his Eliquis for the next 12 months.  I will see him in 6 months with repeat left leg arterial duplex and ABIs for ongoing surveillance.  He can call me with questions or concerns.  Discussed if he develops any tissue loss to let me know.   Cephus Shelling, MD [facility] Office: [phone]

## 2023-02-03 ENCOUNTER — Encounter: Payer: Self-pay | Admitting: Vascular Surgery

## 2023-02-03 ENCOUNTER — Ambulatory Visit (INDEPENDENT_AMBULATORY_CARE_PROVIDER_SITE_OTHER): Payer: Medicare HMO | Admitting: Vascular Surgery

## 2023-02-03 VITALS — BP 137/71 | HR 63 | Ht 70.5 in | Wt 219.0 lb

## 2023-02-03 DIAGNOSIS — I70222 Atherosclerosis of native arteries of extremities with rest pain, left leg: Secondary | ICD-10-CM | POA: Diagnosis not present

## 2023-02-03 MED ORDER — APIXABAN 5 MG PO TABS: 5.0000 mg | ORAL_TABLET | Freq: Two times a day (BID) | ORAL | 11 refills | Status: DC

## 2023-02-09 ENCOUNTER — Other Ambulatory Visit: Payer: Self-pay

## 2023-02-09 DIAGNOSIS — I70222 Atherosclerosis of native arteries of extremities with rest pain, left leg: Secondary | ICD-10-CM

## 2023-02-20 ENCOUNTER — Encounter (INDEPENDENT_AMBULATORY_CARE_PROVIDER_SITE_OTHER): Payer: Medicare HMO | Admitting: Ophthalmology

## 2023-02-20 DIAGNOSIS — H35033 Hypertensive retinopathy, bilateral: Secondary | ICD-10-CM

## 2023-02-20 DIAGNOSIS — I10 Essential (primary) hypertension: Secondary | ICD-10-CM

## 2023-02-20 DIAGNOSIS — Z7984 Long term (current) use of oral hypoglycemic drugs: Secondary | ICD-10-CM

## 2023-02-20 DIAGNOSIS — E113313 Type 2 diabetes mellitus with moderate nonproliferative diabetic retinopathy with macular edema, bilateral: Secondary | ICD-10-CM

## 2023-02-20 DIAGNOSIS — H43813 Vitreous degeneration, bilateral: Secondary | ICD-10-CM | POA: Diagnosis not present

## 2023-03-24 ENCOUNTER — Encounter (INDEPENDENT_AMBULATORY_CARE_PROVIDER_SITE_OTHER): Payer: Medicare HMO | Admitting: Ophthalmology

## 2023-03-31 ENCOUNTER — Encounter (INDEPENDENT_AMBULATORY_CARE_PROVIDER_SITE_OTHER): Payer: Medicare HMO | Admitting: Ophthalmology

## 2023-03-31 DIAGNOSIS — E113313 Type 2 diabetes mellitus with moderate nonproliferative diabetic retinopathy with macular edema, bilateral: Secondary | ICD-10-CM

## 2023-03-31 DIAGNOSIS — I10 Essential (primary) hypertension: Secondary | ICD-10-CM | POA: Diagnosis not present

## 2023-03-31 DIAGNOSIS — H35033 Hypertensive retinopathy, bilateral: Secondary | ICD-10-CM

## 2023-03-31 DIAGNOSIS — H43813 Vitreous degeneration, bilateral: Secondary | ICD-10-CM | POA: Diagnosis not present

## 2023-03-31 DIAGNOSIS — Z7984 Long term (current) use of oral hypoglycemic drugs: Secondary | ICD-10-CM | POA: Diagnosis not present

## 2023-04-13 ENCOUNTER — Ambulatory Visit: Payer: Medicare HMO | Admitting: Podiatry

## 2023-04-13 DIAGNOSIS — M79675 Pain in left toe(s): Secondary | ICD-10-CM | POA: Diagnosis not present

## 2023-04-13 DIAGNOSIS — E1142 Type 2 diabetes mellitus with diabetic polyneuropathy: Secondary | ICD-10-CM | POA: Diagnosis not present

## 2023-04-13 DIAGNOSIS — R234 Changes in skin texture: Secondary | ICD-10-CM | POA: Diagnosis not present

## 2023-04-13 DIAGNOSIS — M79674 Pain in right toe(s): Secondary | ICD-10-CM

## 2023-04-13 DIAGNOSIS — B351 Tinea unguium: Secondary | ICD-10-CM | POA: Diagnosis not present

## 2023-04-13 MED ORDER — GABAPENTIN 300 MG PO CAPS: 900.0000 mg | ORAL_CAPSULE | Freq: Two times a day (BID) | ORAL | 0 refills | Status: DC

## 2023-04-13 MED ORDER — MUPIROCIN 2 % EX OINT: 1.0000 | TOPICAL_OINTMENT | Freq: Two times a day (BID) | CUTANEOUS | 2 refills | Status: DC

## 2023-04-13 NOTE — Patient Instructions (Signed)

## 2023-04-13 NOTE — Progress Notes (Signed)
 Subjective: Chief Complaint  Patient presents with   Foot Pain    RM#11 Est bottom of left foot/ heel is cracking unable to look at it wants doctor to evaluate and trim nails.      73 year old male presents as above concerns.  Uses nails trimmed still thickened elongated he cannot do them himself.  He is noticed a skin fissure to the posterior aspect of the left heel he has been keeping Vaseline on it.  Denies any drainage or pus or any swelling or redness.  He was seen by Dr. Lydia Sams last appointment placed on Lyrica  but he was not able to tolerate it.  He has been back to taking gabapentin  asking for refill.  He has no other concerns today.  Objective: AAO x3, NAD DP/PT pulses palpable bilaterally, CRT less than 3 seconds Sensation decreased Nails are hypertrophic, dystrophic, brittle, discolored, elongated 10. No surrounding redness or drainage. Tenderness nails 1-5 bilaterally except the left second toe which has been amputated. The posterior aspect of the left heel is a superficial skin fissure.  Upon debridement there is no open lesion identified.  There is no drainage or pus or any signs of infection. No other open lesions are identified bilaterally. No pain with calf compression, swelling, warmth, erythema  Assessment: 73 year old male with symptomatic onychosis, skin fissure, neuropathy   Plan: Symptomatic onychomycosis -Sharply debrided nails x 9 without any complications or bleeding  Skin fissure -Debrided the skin fissure without any complications.  Prescribing Pearson ointment to apply during the day but some amount of Vaseline at nighttime.  Monitoring signs or symptoms of infection or further skin breakdown.  Not healed next couple weeks or any other symptoms any changes or worsening.  Neuropathy -Could not tolderate pregabalin .  He is back to taking gabapentin .  I refilled this as prescribed previously.  Charity Conch DPM

## 2023-04-15 ENCOUNTER — Ambulatory Visit: Payer: Medicare HMO | Admitting: Family Medicine

## 2023-04-15 DIAGNOSIS — E1169 Type 2 diabetes mellitus with other specified complication: Secondary | ICD-10-CM

## 2023-04-17 ENCOUNTER — Other Ambulatory Visit: Payer: Self-pay | Admitting: Family Medicine

## 2023-04-17 ENCOUNTER — Encounter: Payer: Self-pay | Admitting: Family Medicine

## 2023-04-17 ENCOUNTER — Ambulatory Visit: Payer: Medicare HMO | Admitting: Family Medicine

## 2023-04-17 VITALS — BP 156/69 | HR 67 | Temp 97.1°F | Ht 70.5 in | Wt 224.6 lb

## 2023-04-17 DIAGNOSIS — E1169 Type 2 diabetes mellitus with other specified complication: Secondary | ICD-10-CM

## 2023-04-17 DIAGNOSIS — E113553 Type 2 diabetes mellitus with stable proliferative diabetic retinopathy, bilateral: Secondary | ICD-10-CM

## 2023-04-17 DIAGNOSIS — E1142 Type 2 diabetes mellitus with diabetic polyneuropathy: Secondary | ICD-10-CM

## 2023-04-17 DIAGNOSIS — E11319 Type 2 diabetes mellitus with unspecified diabetic retinopathy without macular edema: Secondary | ICD-10-CM | POA: Insufficient documentation

## 2023-04-17 DIAGNOSIS — E785 Hyperlipidemia, unspecified: Secondary | ICD-10-CM | POA: Diagnosis not present

## 2023-04-17 DIAGNOSIS — Z7984 Long term (current) use of oral hypoglycemic drugs: Secondary | ICD-10-CM

## 2023-04-17 DIAGNOSIS — E1159 Type 2 diabetes mellitus with other circulatory complications: Secondary | ICD-10-CM | POA: Diagnosis not present

## 2023-04-17 DIAGNOSIS — I152 Hypertension secondary to endocrine disorders: Secondary | ICD-10-CM

## 2023-04-17 DIAGNOSIS — I739 Peripheral vascular disease, unspecified: Secondary | ICD-10-CM

## 2023-04-17 DIAGNOSIS — Z0000 Encounter for general adult medical examination without abnormal findings: Secondary | ICD-10-CM

## 2023-04-17 LAB — BAYER DCA HB A1C WAIVED: HB A1C (BAYER DCA - WAIVED): 7.1 % — ABNORMAL HIGH (ref 4.8–5.6)

## 2023-04-17 MED ORDER — LISINOPRIL 5 MG PO TABS: 5.0000 mg | ORAL_TABLET | Freq: Every day | ORAL | 3 refills | Status: DC

## 2023-04-17 MED ORDER — METFORMIN HCL 500 MG PO TABS: 1500.0000 mg | ORAL_TABLET | Freq: Two times a day (BID) | ORAL | 3 refills | Status: DC

## 2023-04-17 MED ORDER — METFORMIN HCL ER 500 MG PO TB24: 1500.0000 mg | ORAL_TABLET | Freq: Two times a day (BID) | ORAL | 2 refills | Status: DC

## 2023-04-17 NOTE — Progress Notes (Signed)
Subjective:  Patient ID: [name], male    DOB: [birth year], 73 y.o.   MRN: [id number]  Patient Care Team: Sonny Masters, FNP as PCP - General (Family Medicine) Reather Littler, MD (Inactive) as Consulting Physician (Endocrinology) Newman Pies, MD as Consulting Physician (Otolaryngology) [facility] Sherrie George, MD as Consulting Physician (Ophthalmology) Quentin Angst, MD as Consulting Physician (Internal Medicine) Redge Gainer, RN as Case Manager Shey Bartmess, Doralee Albino, FNP (Family Medicine) Heavin Sebree, Doralee Albino, FNP as Attending Physician (Family Medicine)   Chief Complaint:  Diabetes (3 month follow up )   HPI: [name] is a 73 y.o. male presenting on 04/17/2023 for Diabetes (3 month follow up )   Discussed the use of AI scribe software for clinical note transcription with the patient, who gave verbal consent to proceed.  History of Present Illness   [name] is a 73 year old male with diabetes and peripheral vascular disease who presents with medication management issues and sinus problems.  He is experiencing medication management issues, particularly with Eliquis, which he has been unable to afford for the past two weeks due to its cost exceeding $300. He has not been prescribed an alternative anticoagulant and has not been advised to take aspirin or any other medication in place of Eliquis.  His blood sugar levels have been stable, with a recent reading of 142 mg/dL after consuming high-carbohydrate foods like muffins. He notes increased thirst over the past few months, drinking a lot of water and Gatorade. He is currently taking metformin, three pills in the morning and three in the evening, a regimen he has been on for a long time.  He experiences significant neuropathic pain in his feet, which has been particularly severe over the past six months, affecting his sleep. He often stays up until 4 or 5 AM due to discomfort. A  recent visit to a foot doctor resulted in a prescription for a new medication for neuropathy, which caused significant side effects including vision problems and irritability. He has since returned to taking gabapentin.  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 His symptoms include constant sinus drainage that comes and goes. He has been taking Allegra for the past month and occasionally uses NyQuil, although he notes that NyQuil is not specifically for his sinus issues.  He receives monthly eye injections for diabetic retinopathy in both eyes, a treatment he has been undergoing for the past three years. He reports fluid behind his eyes, which his doctor is attempting to manage.  He continues to take lisinopril for blood pressure without any reported side effects. He also takes medication for cholesterol without experiencing muscle aches, although he does have leg and foot pain.          Relevant past medical, surgical, family, and social history reviewed and updated as indicated.  Allergies and medications reviewed and updated. Data reviewed: Chart in Epic.   Past Medical History:  Diagnosis Date   Allergy    seasonal   Arthritis    " all over me- back, hips, knees, hands, everywhere "   Cancer [facility])    Prostate cancer age 39; Lupron treatments followed by prostatectomy.   Cataract    left removed, forming right   Chronic constipation    x 23 yrs after prostate surgery   Diabetes mellitus without complication (HCC)  Diabetic retinal damage of both eyes (HCC)    gets shots every month   GERD (gastroesophageal reflux disease)    Hyperlipidemia    Hypertension    Myocardial infarction [facility])    age 33; no stenting.   OSA (obstructive sleep apnea)    non-compliant with CPAP.   Sleep apnea    no cpap    Past Surgical History:  Procedure Laterality Date   ABDOMINAL AORTOGRAM W/LOWER  EXTREMITY N/A 01/17/2022   Procedure: ABDOMINAL AORTOGRAM W/LOWER EXTREMITY;  Surgeon: Frost Hint, MD;  Location: [facility] INVASIVE CV LAB;  Service: Cardiovascular;  Laterality: N/A;   ABDOMINAL AORTOGRAM W/LOWER EXTREMITY Bilateral 12/04/2022   Procedure: ABDOMINAL AORTOGRAM W/LOWER EXTREMITY;  Surgeon: Cephus Shelling, MD;  Location: [facility] INVASIVE CV LAB;  Service: Cardiovascular;  Laterality: Bilateral;  Lysis infuaion via lt Bypass graft   AMPUTATION TOE Left 02/04/2022   Procedure: AMPUTATION  SECOND TOE;  Surgeon: Vivi Barrack, DPM;  Location: [facility] OR;  Service: Podiatry;  Laterality: Left;   APPENDECTOMY     CHOLECYSTECTOMY     FEMORAL-TIBIAL BYPASS GRAFT Left 01/29/2022   Procedure: LEFT COMMON FEMORAL BYPASS TO ANTERIOR TIBIAL ARTERY;  Surgeon: Cephus Shelling, MD;  Location: [facility] OR;  Service: Vascular;  Laterality: Left;   PERIPHERAL VASCULAR INTERVENTION  12/05/2022   Procedure: PERIPHERAL VASCULAR INTERVENTION;  Surgeon: Leonie Douglas, MD;  Location: [facility] INVASIVE CV LAB;  Service: Cardiovascular;;   PERIPHERAL VASCULAR THROMBECTOMY N/A 12/05/2022   Procedure: LYSIS RECHECK;  Surgeon: Leonie Douglas, MD;  Location: [facility] INVASIVE CV LAB;  Service: Cardiovascular;  Laterality: N/A;   PROSTATE SURGERY     VEIN HARVEST Left 01/29/2022   Procedure: HARVEST OF LEFT LEG GREAT SAPHENOUS VEIN;  Surgeon: Cephus Shelling, MD;  Location: [facility] OR;  Service: Vascular;  Laterality: Left;    Social History   Socioeconomic History   Marital status: Single    Spouse name: GF-Betty   Number of children: 2   Years of education: 11   Highest education level: Not on file  Occupational History   Occupation: supervisor    Employer: [employer]   Tobacco Use   Smoking status: Never    Passive exposure: Never   Smokeless tobacco: Former    Types: Engineer, drilling   Vaping status: Never Used  Substance and Sexual Activity   Alcohol use: Yes    Comment: social   Drug  use: Not Currently   Sexual activity: Not Currently  Other Topics Concern   Not on file  Social History Narrative   Marital status: divorced, girlfriend x 1977     Children: 1      Tobacco: none; chew tobacco      Alcohol: beers on weekends      Exercise: none   Social Drivers of Corporate investment banker Strain: Low Risk  (12/23/2022)   Overall Financial Resource Strain (CARDIA)    Difficulty of Paying Living Expenses: Not hard at all  Food Insecurity: No Food Insecurity (12/16/2022)   Hunger Vital Sign    Worried About Running Out of Food in the Last Year: Never true    Ran Out of Food in the Last Year: Never true  Transportation Needs: No Transportation Needs (12/16/2022)   PRAPARE - Administrator, Civil Service (Medical): No    Lack of Transportation (Non-Medical): No  Physical Activity: Insufficiently Active (12/23/2022)   Exercise Vital Sign    Days of  Exercise per Week: 4 days    Minutes of Exercise per Session: 10 min  Stress: No Stress Concern Present (12/23/2022)   [employer] of Occupational Health - Occupational Stress Questionnaire    Feeling of Stress : Not at all  Social Connections: Socially Isolated (12/16/2022)   Social Connection and Isolation Panel [NHANES]    Frequency of Communication with Friends and Family: More than three times a week    Frequency of Social Gatherings with Friends and Family: More than three times a week    Attends Religious Services: Never    Database administrator or Organizations: No    Attends Banker Meetings: Never    Marital Status: Divorced  Catering manager Violence: Not At Risk (12/16/2022)   Humiliation, Afraid, Rape, and Kick questionnaire    Fear of Current or Ex-Partner: No    Emotionally Abused: No    Physically Abused: No    Sexually Abused: No    Outpatient Encounter Medications as of 04/17/2023  Medication Sig   aspirin 81 MG tablet Take 81 mg by mouth daily.   BAYER CONTOUR  NEXT TEST test strip CHECK BLOOD SUGAR TWICE DAILY   BAYER MICROLET LANCETS lancets Check blood sugar two times daily.   Blood Glucose Monitoring Suppl (BAYER CONTOUR NEXT MONITOR) w/Device KIT Check blood sugar two times daily.   ciprofloxacin (CILOXAN) 0.3 % ophthalmic solution Place 1 drop into both eyes See admin instructions. NSTILL ONE DROP INTO BOTH EYES 4 TIMES A DAY FOR TWO DAYS AFTER EACH MONTHLY EYE INJECTION   fluticasone (FLONASE) 50 MCG/ACT nasal spray Place 1 spray into both nostrils daily as needed for allergies or rhinitis.   gabapentin (NEURONTIN) 300 MG capsule Take 3 capsules (900 mg total) by mouth 2 (two) times daily.   mupirocin ointment (BACTROBAN) 2 % Apply 1 Application topically 2 (two) times daily.   pantoprazole (PROTONIX) 40 MG tablet TAKE 1 TABLET (40 MG TOTAL) BY MOUTH TWICE A DAY BEFORE MEALS   rosuvastatin (CRESTOR) 20 MG tablet Take 1 tablet (20 mg total) by mouth daily.   [DISCONTINUED] lisinopril (ZESTRIL) 5 MG tablet Take 1 tablet (5 mg total) by mouth daily.   apixaban (ELIQUIS) 5 MG TABS tablet Take 1 tablet (5 mg total) by mouth 2 (two) times daily. (Patient not taking: Reported on 04/17/2023)   lisinopril (ZESTRIL) 5 MG tablet Take 1 tablet (5 mg total) by mouth daily.   metFORMIN (GLUCOPHAGE-XR) 500 MG 24 hr tablet Take 3 tablets (1,500 mg total) by mouth 2 (two) times daily with a meal. Take 3 tablets (1,1500 mg total) by mouth daily with breakfast AND 3 tablets (1,500 mg total) every evening.   [DISCONTINUED] metFORMIN (GLUCOPHAGE-XR) 500 MG 24 hr tablet Take 2 tablets (1,000 mg total) by mouth daily with breakfast AND 2 tablets (1,000 mg total) every evening. 4 tablets daily. (Patient taking differently: Take 3 tablets (1,1500 mg total) by mouth daily with breakfast AND 3 tablets (1,500 mg total) every evening. )   No facility-administered encounter medications on file as of 04/17/2023.    Allergies  Allergen Reactions   Cymbalta [Duloxetine Hcl] Nausea  And Vomiting   Darvocet [Propoxyphene N-Acetaminophen] Hives    Pertinent ROS per HPI, otherwise unremarkable      Objective:  BP (!) 156/69   Pulse 67   Temp (!) 97.1 F (36.2 C)   Ht 5' 10.5" (1.791 m)   Wt 224 lb 9.6 oz (101.9 kg)   SpO2 99%  BMI 31.77 kg/m    Wt Readings from Last 3 Encounters:  04/17/23 224 lb 9.6 oz (101.9 kg)  02/03/23 219 lb (99.3 kg)  01/07/23 216 lb (98 kg)    Physical Exam Vitals and nursing note reviewed.  Constitutional:      General: He is not in acute distress.    Appearance: Normal appearance. He is obese. He is not ill-appearing, toxic-appearing or diaphoretic.  HENT:     Head: Normocephalic and atraumatic.     Nose: Nose normal.     Mouth/Throat:     Mouth: Mucous membranes are moist.     Pharynx: Oropharynx is clear.  Eyes:     Conjunctiva/sclera: Conjunctivae normal.  Cardiovascular:     Rate and Rhythm: Normal rate and regular rhythm.     Heart sounds: Normal heart sounds.  Pulmonary:     Effort: Pulmonary effort is normal.     Breath sounds: Normal breath sounds.  Musculoskeletal:     Right lower leg: No edema.     Left lower leg: No edema.  Skin:    General: Skin is warm and dry.     Capillary Refill: Capillary refill takes less than 2 seconds.  Neurological:     General: No focal deficit present.     Mental Status: He is alert and oriented to person, place, and time.  Psychiatric:        Mood and Affect: Mood normal.        Behavior: Behavior normal.        Thought Content: Thought content normal.        Judgment: Judgment normal.     Results for orders placed or performed in visit on 04/17/23  Bayer DCA Hb A1c Waived   Collection Time: 04/17/23  8:24 AM  Result Value Ref Range   HB A1C (BAYER DCA - WAIVED) 7.1 (H) 4.8 - 5.6 %       Pertinent labs & imaging results that were available during my care of the patient were reviewed by me and considered in my medical decision making.  Assessment & Plan:   Geno was seen today for diabetes.  Diagnoses and all orders for this visit:  Hypertension associated with type 2 diabetes mellitus (HCC) -     Thyroid Panel With TSH -     Bayer DCA Hb A1c Waived -     Microalbumin / creatinine urine ratio -     lisinopril (ZESTRIL) 5 MG tablet; Take 1 tablet (5 mg total) by mouth daily.  Type 2 diabetes mellitus with other specified complication, without long-term current use of insulin (HCC) -     Thyroid Panel With TSH -     Bayer DCA Hb A1c Waived -     metFORMIN (GLUCOPHAGE-XR) 500 MG 24 hr tablet; Take 3 tablets (1,500 mg total) by mouth 2 (two) times daily with a meal. Take 3 tablets (1,1500 mg total) by mouth daily with breakfast AND 3 tablets (1,500 mg total) every evening. -     Microalbumin / creatinine urine ratio  Hyperlipidemia associated with type 2 diabetes mellitus (HCC) -     Bayer DCA Hb A1c Waived  Diabetic polyneuropathy associated with type 2 diabetes mellitus (HCC) -     Bayer DCA Hb A1c Waived  PAD (peripheral artery disease) (HCC) -     Bayer DCA Hb A1c Waived  Stable proliferative diabetic retinopathy of both eyes associated with type 2 diabetes mellitus (HCC) -  Bayer DCA Hb A1c Waived     Assessment and Plan    Type 2 Diabetes Mellitus Blood sugars generally well-controlled with occasional spikes. Current A1c is 7.1, elevated from previous 6.4. Reports increased thirst. On high-dose metformin (three pills in the morning and three in the evening). Emphasized dietary management. - Continue current metformin dosage - Emphasize dietary management - Check vitamin B12 levels - Follow-up A1c in three months  Peripheral Neuropathy Severe neuropathic pain in feet, affecting sleep and daily activities. Previously tried a new medication with significant side effects, now back on gabapentin. Discussed monitoring for side effects and checking vitamin B12 levels. - Continue gabapentin - Monitor for side effects - Check  vitamin B12 levels  Diabetic Retinopathy Receives monthly eye injections for diabetic retinopathy with fluid behind the eyes. Last seen by Dr. Ashley Royalty. Emphasized continuing monthly injections. - Continue monthly eye injections with Dr. Ashley Royalty - Monitor for changes in vision  Atrial Fibrillation Unable to afford Eliquis, off it for two weeks. Discussed importance of anticoagulation to prevent thromboembolic events. Mentioned alternatives like Coumadin with frequent INR checks. Consider full-strength aspirin if no other options. - Check for Eliquis samples - Discuss alternative anticoagulation options with Dr. Chestine Spore - Consider full-strength aspirin if no other options  Hypertension On lisinopril with no reported side effects. Blood pressure well-controlled. - Continue lisinopril - Monitor blood pressure regularly  Hyperlipidemia On cholesterol medication with no reported muscle aches or pains. - Continue current cholesterol medication - Monitor lipid levels regularly  Chronic Sinusitis Ongoing sinus issues likely exacerbated by black mold exposure. Symptoms include constant sinus drainage. Currently taking Allegra and NyQuil. Discussed potential use of Flonase if symptoms persist. - Continue Allegra - Consider environmental modifications to reduce mold exposure - Discuss potential use of Flonase if symptoms persist  General Health Maintenance Routine health maintenance discussed, including the importance of diet and regular follow-ups. - Emphasize importance of diet in managing diabetes - Schedule follow-up in three months  Follow-up - Follow-up in three months for diabetes management - Reach out to Dr. Chestine Spore regarding anticoagulation - Check vitamin B12 levels - Continue monthly eye injections with Dr. Ashley Royalty.      Total time spent with patient 40 minutes.      Continue all other maintenance medications.  Follow up plan: Return in about 3 months (around 07/15/2023)  for DM.   Continue healthy lifestyle choices, including diet (rich in fruits, vegetables, and lean proteins, and low in salt and simple carbohydrates) and exercise (at least 30 minutes of moderate physical activity daily).  Educational handout given for DM  The above assessment and management plan was discussed with the patient. The patient verbalized understanding of and has agreed to the management plan. Patient is aware to call the clinic if they develop any new symptoms or if symptoms persist or worsen. Patient is aware when to return to the clinic for a follow-up visit. Patient educated on when it is appropriate to go to the emergency department.   Kari Baars, FNP-C [facility] [phone]

## 2023-04-17 NOTE — Patient Instructions (Addendum)

## 2023-04-17 NOTE — Addendum Note (Signed)
Addended by: Sonny Masters on: 04/17/2023 01:32 PM   Modules accepted: Orders

## 2023-04-17 NOTE — Telephone Encounter (Signed)
Name from pharmacy: METFORMIN HCL ER 500 MG TABLET   Pharmacy comment: Script Clarification:PATIENT HAS BEEN ON PLAIN METFORMIN 500MG  WITH THESE DIRECTIONS; DID YOU WANT TO CHANGE TO THE XR/ER FORM AND IF YOU ARE CHANGING DID YOU WANT DIFFERENT DIRECTIONS?

## 2023-04-18 LAB — THYROID PANEL WITH TSH
Free Thyroxine Index: 2 (ref 1.2–4.9)
T3 Uptake Ratio: 27 % (ref 24–39)
T4, Total: 7.5 ug/dL (ref 4.5–12.0)
TSH: 6.96 u[IU]/mL — ABNORMAL HIGH (ref 0.450–4.500)

## 2023-04-24 ENCOUNTER — Encounter: Payer: Self-pay | Admitting: Pulmonary Disease

## 2023-04-27 ENCOUNTER — Encounter (INDEPENDENT_AMBULATORY_CARE_PROVIDER_SITE_OTHER): Payer: Medicare HMO | Admitting: Ophthalmology

## 2023-04-27 DIAGNOSIS — Z7984 Long term (current) use of oral hypoglycemic drugs: Secondary | ICD-10-CM | POA: Diagnosis not present

## 2023-04-27 DIAGNOSIS — I10 Essential (primary) hypertension: Secondary | ICD-10-CM

## 2023-04-27 DIAGNOSIS — H35033 Hypertensive retinopathy, bilateral: Secondary | ICD-10-CM

## 2023-04-27 DIAGNOSIS — E113313 Type 2 diabetes mellitus with moderate nonproliferative diabetic retinopathy with macular edema, bilateral: Secondary | ICD-10-CM

## 2023-04-27 DIAGNOSIS — H43813 Vitreous degeneration, bilateral: Secondary | ICD-10-CM

## 2023-05-25 ENCOUNTER — Encounter (INDEPENDENT_AMBULATORY_CARE_PROVIDER_SITE_OTHER): Payer: Medicare HMO | Admitting: Ophthalmology

## 2023-05-27 ENCOUNTER — Encounter (INDEPENDENT_AMBULATORY_CARE_PROVIDER_SITE_OTHER): Admitting: Ophthalmology

## 2023-05-27 DIAGNOSIS — I10 Essential (primary) hypertension: Secondary | ICD-10-CM

## 2023-05-27 DIAGNOSIS — E113313 Type 2 diabetes mellitus with moderate nonproliferative diabetic retinopathy with macular edema, bilateral: Secondary | ICD-10-CM

## 2023-05-27 DIAGNOSIS — H2511 Age-related nuclear cataract, right eye: Secondary | ICD-10-CM | POA: Diagnosis not present

## 2023-05-27 DIAGNOSIS — Z7984 Long term (current) use of oral hypoglycemic drugs: Secondary | ICD-10-CM

## 2023-05-27 DIAGNOSIS — H35033 Hypertensive retinopathy, bilateral: Secondary | ICD-10-CM | POA: Diagnosis not present

## 2023-05-27 DIAGNOSIS — H43813 Vitreous degeneration, bilateral: Secondary | ICD-10-CM

## 2023-06-15 ENCOUNTER — Ambulatory Visit: Payer: Medicare HMO | Admitting: Podiatry

## 2023-06-15 ENCOUNTER — Encounter: Payer: Self-pay | Admitting: Podiatry

## 2023-06-15 DIAGNOSIS — B351 Tinea unguium: Secondary | ICD-10-CM

## 2023-06-15 DIAGNOSIS — L853 Xerosis cutis: Secondary | ICD-10-CM

## 2023-06-15 DIAGNOSIS — M792 Neuralgia and neuritis, unspecified: Secondary | ICD-10-CM | POA: Diagnosis not present

## 2023-06-15 DIAGNOSIS — M79675 Pain in left toe(s): Secondary | ICD-10-CM

## 2023-06-15 DIAGNOSIS — M79674 Pain in right toe(s): Secondary | ICD-10-CM | POA: Diagnosis not present

## 2023-06-15 DIAGNOSIS — E1142 Type 2 diabetes mellitus with diabetic polyneuropathy: Secondary | ICD-10-CM

## 2023-06-15 NOTE — Progress Notes (Unsigned)
 Subjective: Chief Complaint  Patient presents with   [facility]    RM#[id number] DFC     73 year old male presents as above concerns.  States his nails are thickened elongated cannot trim himself may cause discomfort.  No open lesions that he reports.  No other concerns to his feet today.    Patient takes Eliquis but he states he cannot afford it.  He has seen his PCP for this.   Objective: AAO x3, NAD DP/PT pulses palpable bilaterally, CRT less than 3 seconds Sensation decreased Nails are hypertrophic, dystrophic, brittle, discolored, elongated 9. No surrounding redness or drainage. Tenderness nails 1-5 bilaterally except the left second toe which has been amputated. Dry skin present at the heel without any open lesions bilaterally. No other open lesions are identified bilaterally. No pain with calf compression, swelling, warmth, erythema  Assessment: 72 year old male with symptomatic onychosis, skin fissure, neuropathy   Plan: Symptomatic onychomycosis -Sharply debrided nails x 9 without any complications or bleeding  Dry skin -Continue moisturizer.  Neuropathy - Continue gabapentin. - Daily foot inspection  Charity Conch DPM

## 2023-06-24 ENCOUNTER — Encounter (INDEPENDENT_AMBULATORY_CARE_PROVIDER_SITE_OTHER): Admitting: Ophthalmology

## 2023-06-24 DIAGNOSIS — Z7984 Long term (current) use of oral hypoglycemic drugs: Secondary | ICD-10-CM | POA: Diagnosis not present

## 2023-06-24 DIAGNOSIS — I10 Essential (primary) hypertension: Secondary | ICD-10-CM

## 2023-06-24 DIAGNOSIS — E113313 Type 2 diabetes mellitus with moderate nonproliferative diabetic retinopathy with macular edema, bilateral: Secondary | ICD-10-CM

## 2023-06-24 DIAGNOSIS — H43813 Vitreous degeneration, bilateral: Secondary | ICD-10-CM | POA: Diagnosis not present

## 2023-06-24 DIAGNOSIS — H35033 Hypertensive retinopathy, bilateral: Secondary | ICD-10-CM | POA: Diagnosis not present

## 2023-07-21 ENCOUNTER — Telehealth: Payer: Self-pay | Admitting: Family Medicine

## 2023-07-21 ENCOUNTER — Encounter: Payer: Self-pay | Admitting: Family Medicine

## 2023-07-21 ENCOUNTER — Other Ambulatory Visit (HOSPITAL_COMMUNITY): Payer: Self-pay

## 2023-07-21 ENCOUNTER — Ambulatory Visit (INDEPENDENT_AMBULATORY_CARE_PROVIDER_SITE_OTHER): Payer: Medicare HMO | Admitting: Family Medicine

## 2023-07-21 ENCOUNTER — Telehealth: Payer: Self-pay | Admitting: Pharmacist

## 2023-07-21 VITALS — BP 122/63 | HR 68 | Temp 97.4°F | Ht 70.5 in | Wt 221.4 lb

## 2023-07-21 DIAGNOSIS — R7989 Other specified abnormal findings of blood chemistry: Secondary | ICD-10-CM | POA: Diagnosis not present

## 2023-07-21 DIAGNOSIS — E1142 Type 2 diabetes mellitus with diabetic polyneuropathy: Secondary | ICD-10-CM

## 2023-07-21 DIAGNOSIS — C61 Malignant neoplasm of prostate: Secondary | ICD-10-CM | POA: Insufficient documentation

## 2023-07-21 DIAGNOSIS — Z7984 Long term (current) use of oral hypoglycemic drugs: Secondary | ICD-10-CM

## 2023-07-21 DIAGNOSIS — I70222 Atherosclerosis of native arteries of extremities with rest pain, left leg: Secondary | ICD-10-CM

## 2023-07-21 DIAGNOSIS — E1169 Type 2 diabetes mellitus with other specified complication: Secondary | ICD-10-CM | POA: Diagnosis not present

## 2023-07-21 DIAGNOSIS — Z1329 Encounter for screening for other suspected endocrine disorder: Secondary | ICD-10-CM | POA: Diagnosis not present

## 2023-07-21 DIAGNOSIS — I739 Peripheral vascular disease, unspecified: Secondary | ICD-10-CM

## 2023-07-21 LAB — BAYER DCA HB A1C WAIVED: HB A1C (BAYER DCA - WAIVED): 7.3 % — ABNORMAL HIGH (ref 4.8–5.6)

## 2023-07-21 MED ORDER — APIXABAN 5 MG PO TABS: 5.0000 mg | ORAL_TABLET | Freq: Two times a day (BID) | ORAL | 11 refills | Status: DC

## 2023-07-21 NOTE — Telephone Encounter (Signed)
 Patient aware and verbalizes understanding.

## 2023-07-21 NOTE — Telephone Encounter (Signed)
 Called patient regarding Eliquis  affordability. He reports the last time he tried to fill Eliquis  was in 2024 when he was in the donut hole and cost was ~$300 which was unaffordable. He is unsure if he has met his deductible for 2025 and has not tried to fill yet this year. Eliquis  was resent by PCP to [facility] today and he is planning to stop by there this afternoon to see what the cost is and if he is able to afford it now. Notes he would be able to afford the usual ~$47 per month copay. Discussed Medicare LIS and he is above the income limit. Currently not eligible for Eliquis  PAP as he has not spent 3% of annual income on out-of-pocket prescriptions expenses yet. Recommend calling [employer] to enroll in the medicare prescription payment plan to spread out cost over the year. Patient expressed understanding and will reach out to us  if he unable to get access to Eliquis . Will follow up next week.  Georga Killings, PharmD PGY-1 Pharmacy Resident

## 2023-07-21 NOTE — Patient Instructions (Addendum)

## 2023-07-21 NOTE — Progress Notes (Signed)
 Subjective:  Patient ID: Clayton Salvo., male    DOB: 05-26-1950, 73 y.o.   MRN: [id number]  Patient Care Team: Galvin Jules, FNP as PCP - General (Family Medicine) Reynold Caves, MD as Consulting Physician (Otolaryngology) [facility] Rexene Catching, MD as Consulting Physician (Ophthalmology) Jegede, Olugbemiga E, MD as Consulting Physician (Internal Medicine) Claudene Crystal, RN as Case Manager Sharine Cadle, Georgeann Kindred, FNP (Family Medicine) Carlyle Childes Georgeann Kindred, FNP as Attending Physician (Family Medicine)   Chief Complaint:  Diabetes   HPI: Clayton Hirschman. is a 73 y.o. male presenting on 07/21/2023 for Diabetes   Clayton Sylla. is a 73 year old male with neuropathy and diabetes who presents with worsening leg pain.  He has been experiencing persistent leg pain that has progressively worsened. He has been on gabapentin  for neuropathy for over fifteen years, but now requires four to five pills to achieve relief. He previously tried a different medication for neuropathy but discontinued it due to side effects.  He has a history of diabetes and is currently taking metformin . No increased hunger, thirst, or urination, although he mentions being 'a little thirsty.'  He has not taken Eliquis  since December of last year due to cost issues and is currently taking a baby aspirin  instead. He has a history of peripheral artery disease with previous toe amputations.  He has upcoming appointments with a vascular surgeon next month.          Relevant past medical, surgical, family, and social history reviewed and updated as indicated.  Allergies and medications reviewed and updated. Data reviewed: Chart in Epic.   Past Medical History:  Diagnosis Date   Allergy    seasonal   Arthritis    " all over me- back, hips, knees, hands, everywhere "   Cancer [facility])    Prostate cancer age 87; Lupron treatments followed by prostatectomy.   Cataract    left removed,  forming right   Chronic constipation    x 23 yrs after prostate surgery   Diabetes mellitus without complication (HCC)    Diabetic retinal damage of both eyes (HCC)    gets shots every month   GERD (gastroesophageal reflux disease)    Hyperlipidemia    Hypertension    Myocardial infarction [facility])    age 42; no stenting.   OSA (obstructive sleep apnea)    non-compliant with CPAP.   Sleep apnea    no cpap    Past Surgical History:  Procedure Laterality Date   ABDOMINAL AORTOGRAM W/LOWER EXTREMITY N/A 01/17/2022   Procedure: ABDOMINAL AORTOGRAM W/LOWER EXTREMITY;  Surgeon: Dannis Dy, MD;  Location: [facility] INVASIVE CV LAB;  Service: Cardiovascular;  Laterality: N/A;   ABDOMINAL AORTOGRAM W/LOWER EXTREMITY Bilateral 12/04/2022   Procedure: ABDOMINAL AORTOGRAM W/LOWER EXTREMITY;  Surgeon: Young Hensen, MD;  Location: [facility] INVASIVE CV LAB;  Service: Cardiovascular;  Laterality: Bilateral;  Lysis infuaion via lt Bypass graft   AMPUTATION TOE Left 02/04/2022   Procedure: AMPUTATION  SECOND TOE;  Surgeon: Charity Conch, DPM;  Location: [facility] OR;  Service: Podiatry;  Laterality: Left;   APPENDECTOMY     CHOLECYSTECTOMY     FEMORAL-TIBIAL BYPASS GRAFT Left 01/29/2022   Procedure: LEFT COMMON FEMORAL BYPASS TO ANTERIOR TIBIAL ARTERY;  Surgeon: Young Hensen, MD;  Location: [facility] OR;  Service: Vascular;  Laterality: Left;   PERIPHERAL VASCULAR INTERVENTION  12/05/2022   Procedure: PERIPHERAL VASCULAR INTERVENTION;  Surgeon: Runell Countryman  N, MD;  Location: [facility] INVASIVE CV LAB;  Service: Cardiovascular;;   PERIPHERAL VASCULAR THROMBECTOMY N/A 12/05/2022   Procedure: LYSIS RECHECK;  Surgeon: Carlene Che, MD;  Location: [facility] INVASIVE CV LAB;  Service: Cardiovascular;  Laterality: N/A;   PROSTATE SURGERY     VEIN HARVEST Left 01/29/2022   Procedure: HARVEST OF LEFT LEG GREAT SAPHENOUS VEIN;  Surgeon: Young Hensen, MD;  Location: [facility] OR;  Service: Vascular;  Laterality: Left;     Social History   Socioeconomic History   Marital status: Single    Spouse name: GF-Betty   Number of children: 2   Years of education: 11   Highest education level: Not on file  Occupational History   Occupation: supervisor    Employer: [employer]   Tobacco Use   Smoking status: Never    Passive exposure: Never   Smokeless tobacco: Former    Types: Engineer, drilling   Vaping status: Never Used  Substance and Sexual Activity   Alcohol use: Yes    Comment: social   Drug use: Not Currently   Sexual activity: Not Currently  Other Topics Concern   Not on file  Social History Narrative   Marital status: divorced, girlfriend x 1977     Children: 1      Tobacco: none; chew tobacco      Alcohol: beers on weekends      Exercise: none   Social Drivers of Corporate investment banker Strain: Low Risk  (12/23/2022)   Overall Financial Resource Strain (CARDIA)    Difficulty of Paying Living Expenses: Not hard at all  Food Insecurity: No Food Insecurity (12/16/2022)   Hunger Vital Sign    Worried About Running Out of Food in the Last Year: Never true    Ran Out of Food in the Last Year: Never true  Transportation Needs: No Transportation Needs (12/16/2022)   PRAPARE - Administrator, Civil Service (Medical): No    Lack of Transportation (Non-Medical): No  Physical Activity: Insufficiently Active (12/23/2022)   Exercise Vital Sign    Days of Exercise per Week: 4 days    Minutes of Exercise per Session: 10 min  Stress: No Stress Concern Present (12/23/2022)   [employer] of Occupational Health - Occupational Stress Questionnaire    Feeling of Stress : Not at all  Social Connections: Socially Isolated (12/16/2022)   Social Connection and Isolation Panel [NHANES]    Frequency of Communication with Friends and Family: More than three times a week    Frequency of Social Gatherings with Friends and Family: More than three times a week    Attends  Religious Services: Never    Database administrator or Organizations: No    Attends Banker Meetings: Never    Marital Status: Divorced  Catering manager Violence: Not At Risk (12/16/2022)   Humiliation, Afraid, Rape, and Kick questionnaire    Fear of Current or Ex-Partner: No    Emotionally Abused: No    Physically Abused: No    Sexually Abused: No    Outpatient Encounter Medications as of 07/21/2023  Medication Sig   aspirin  81 MG tablet Take 81 mg by mouth daily.   BAYER CONTOUR NEXT TEST test strip CHECK BLOOD SUGAR TWICE DAILY   BAYER MICROLET LANCETS lancets Check blood sugar two times daily.   Blood Glucose Monitoring Suppl (BAYER CONTOUR NEXT MONITOR) w/Device KIT Check blood sugar two times daily.   ciprofloxacin  (  CILOXAN ) 0.3 % ophthalmic solution Place 1 drop into both eyes See admin instructions. NSTILL ONE DROP INTO BOTH EYES 4 TIMES A DAY FOR TWO DAYS AFTER EACH MONTHLY EYE INJECTION   fluticasone  (FLONASE ) 50 MCG/ACT nasal spray Place 1 spray into both nostrils daily as needed for allergies or rhinitis.   lisinopril  (ZESTRIL ) 5 MG tablet Take 1 tablet (5 mg total) by mouth daily.   metFORMIN  (GLUCOPHAGE ) 500 MG tablet Take 3 tablets (1,500 mg total) by mouth 2 (two) times daily with a meal.   mupirocin  ointment (BACTROBAN ) 2 % Apply 1 Application topically 2 (two) times daily.   pantoprazole  (PROTONIX ) 40 MG tablet TAKE 1 TABLET (40 MG TOTAL) BY MOUTH TWICE A DAY BEFORE MEALS   rosuvastatin  (CRESTOR ) 20 MG tablet Take 1 tablet (20 mg total) by mouth daily.   apixaban  (ELIQUIS ) 5 MG TABS tablet Take 1 tablet (5 mg total) by mouth 2 (two) times daily.   gabapentin  (NEURONTIN ) 300 MG capsule Take 3 capsules (900 mg total) by mouth 2 (two) times daily.   [DISCONTINUED] apixaban  (ELIQUIS ) 5 MG TABS tablet Take 1 tablet (5 mg total) by mouth 2 (two) times daily.   No facility-administered encounter medications on file as of 07/21/2023.    Allergies  Allergen  Reactions   Cymbalta  [Duloxetine  Hcl] Nausea And Vomiting   Darvocet [Propoxyphene N-Acetaminophen ] Hives    Pertinent ROS per HPI, otherwise unremarkable      Objective:  BP 122/63   Pulse 68   Temp (!) 97.4 F (36.3 C)   Ht 5' 10.5" (1.791 m)   Wt 221 lb 6.4 oz (100.4 kg)   SpO2 99%   BMI 31.32 kg/m    Wt Readings from Last 3 Encounters:  07/21/23 221 lb 6.4 oz (100.4 kg)  04/17/23 224 lb 9.6 oz (101.9 kg)  02/03/23 219 lb (99.3 kg)    Physical Exam Vitals and nursing note reviewed.  Constitutional:      General: He is not in acute distress.    Appearance: Normal appearance. He is obese. He is not ill-appearing, toxic-appearing or diaphoretic.  HENT:     Head: Normocephalic and atraumatic.     Nose: Nose normal.     Mouth/Throat:     Mouth: Mucous membranes are moist.  Eyes:     Conjunctiva/sclera: Conjunctivae normal.     Pupils: Pupils are equal, round, and reactive to light.  Cardiovascular:     Rate and Rhythm: Normal rate and regular rhythm.     Heart sounds: Normal heart sounds.  Pulmonary:     Effort: Pulmonary effort is normal.     Breath sounds: Normal breath sounds.  Musculoskeletal:     Cervical back: Neck supple.     Right lower leg: No edema.     Left lower leg: No edema.  Skin:    General: Skin is warm and dry.     Capillary Refill: Capillary refill takes less than 2 seconds.  Neurological:     General: No focal deficit present.     Mental Status: He is alert and oriented to person, place, and time.     Gait: Gait abnormal (slow).  Psychiatric:        Mood and Affect: Mood normal.        Behavior: Behavior normal.        Thought Content: Thought content normal.        Judgment: Judgment normal.     Results for orders placed or performed  in visit on 04/17/23  Bayer DCA Hb A1c Waived   Collection Time: 04/17/23  8:24 AM  Result Value Ref Range   HB A1C (BAYER DCA - WAIVED) 7.1 (H) 4.8 - 5.6 %  Thyroid  Panel With TSH   Collection  Time: 04/17/23  8:26 AM  Result Value Ref Range   TSH 6.960 (H) 0.450 - 4.500 uIU/mL   T4, Total 7.5 4.5 - 12.0 ug/dL   T3 Uptake Ratio 27 24 - 39 %   Free Thyroxine Index 2.0 1.2 - 4.9       Pertinent labs & imaging results that were available during my care of the patient were reviewed by me and considered in my medical decision making.  Assessment & Plan:  [name] was seen today for diabetes.  Diagnoses and all orders for this visit:  Type 2 diabetes mellitus with other specified complication, without long-term current use of insulin  (HCC) -     Bayer DCA Hb A1c Waived -     Thyroid  Panel With TSH  Diabetic polyneuropathy associated with type 2 diabetes mellitus (HCC) -     Bayer DCA Hb A1c Waived -     Thyroid  Panel With TSH  PAD (peripheral artery disease) (HCC) -     apixaban  (ELIQUIS ) 5 MG TABS tablet; Take 1 tablet (5 mg total) by mouth 2 (two) times daily.  Critical limb ischemia of left lower extremity (HCC) -     apixaban  (ELIQUIS ) 5 MG TABS tablet; Take 1 tablet (5 mg total) by mouth 2 (two) times daily.  Elevated TSH -     Thyroid  Panel With TSH      Peripheral artery disease Peripheral artery disease with toe amputation. Discontinued Eliquis  due to cost, increasing risk of complications. Currently on baby aspirin . Risk of limb loss without appropriate anticoagulation therapy. - Consult pharmacist Concha Deed for Eliquis  assistance programs - Consider alternative anticoagulation if Eliquis  is unaffordable - Increase aspirin  to full strength pending vascular surgeon consultation  Peripheral neuropathy Chronic peripheral neuropathy with persistent leg pain. Gabapentin  effective but requires higher doses for relief. Previous alternative medication trial unsuccessful due to side effects. Concerned about gabapentin  refills after 15 years of use. - Continue gabapentin  for neuropathy management - Coordinate gabapentin  refills with pharmacy - Discuss refill issues with  pharmacy  Type 2 diabetes mellitus Type 2 diabetes mellitus with A1c of 7.3%. Reports increased thirst, no significant changes in hunger or urination. Managed with metformin , may require additional medication if A1c remains elevated. - Encourage dietary modifications and increased water  intake - Re-evaluate A1c in three months, consider additional medication if A1c remains above 7%           Continue all other maintenance medications.  Follow up plan: Return in about 3 months (around 10/21/2023) for DM.   Continue healthy lifestyle choices, including diet (rich in fruits, vegetables, and lean proteins, and low in salt and simple carbohydrates) and exercise (at least 30 minutes of moderate physical activity daily).  Educational handout given for DM  The above assessment and management plan was discussed with the patient. The patient verbalized understanding of and has agreed to the management plan. Patient is aware to call the clinic if they develop any new symptoms or if symptoms persist or worsen. Patient is aware when to return to the clinic for a follow-up visit. Patient educated on when it is appropriate to go to the emergency department.   Kattie Parrot, FNP-C [facility] [phone]

## 2023-07-21 NOTE — Telephone Encounter (Signed)
 Copied from CRM [id number]. Topic: Clinical - Medication Question >> Jul 21, 2023  1:11 PM [employer] wrote: Reason for CRM: Patient is wanting to know if he can be switched to a more affordable blood thinner. apixaban  (ELIQUIS ) 5 MG TABS tablet is too expensive  Callback #:[id number]  Preferred Pharmacy: [facility] [phone] - [place], [place] - [address] [place] [address] Phone: [phone] Fax: [phone] Hours: Not open 24 hours

## 2023-07-22 ENCOUNTER — Encounter (INDEPENDENT_AMBULATORY_CARE_PROVIDER_SITE_OTHER): Admitting: Ophthalmology

## 2023-07-22 ENCOUNTER — Ambulatory Visit: Payer: Self-pay | Admitting: Family Medicine

## 2023-07-22 DIAGNOSIS — E113313 Type 2 diabetes mellitus with moderate nonproliferative diabetic retinopathy with macular edema, bilateral: Secondary | ICD-10-CM

## 2023-07-22 DIAGNOSIS — H43813 Vitreous degeneration, bilateral: Secondary | ICD-10-CM | POA: Diagnosis not present

## 2023-07-22 DIAGNOSIS — I10 Essential (primary) hypertension: Secondary | ICD-10-CM | POA: Diagnosis not present

## 2023-07-22 DIAGNOSIS — Z7984 Long term (current) use of oral hypoglycemic drugs: Secondary | ICD-10-CM | POA: Diagnosis not present

## 2023-07-22 DIAGNOSIS — H35033 Hypertensive retinopathy, bilateral: Secondary | ICD-10-CM

## 2023-07-22 DIAGNOSIS — H2511 Age-related nuclear cataract, right eye: Secondary | ICD-10-CM

## 2023-07-22 LAB — THYROID PANEL WITH TSH
Free Thyroxine Index: 2.2 (ref 1.2–4.9)
T3 Uptake Ratio: 28 % (ref 24–39)
T4, Total: 8 ug/dL (ref 4.5–12.0)
TSH: 3.67 u[IU]/mL (ref 0.450–4.500)

## 2023-07-29 ENCOUNTER — Ambulatory Visit (INDEPENDENT_AMBULATORY_CARE_PROVIDER_SITE_OTHER): Admitting: Family Medicine

## 2023-07-29 ENCOUNTER — Ambulatory Visit

## 2023-07-29 VITALS — BP 143/75 | HR 75 | Temp 97.1°F

## 2023-07-29 DIAGNOSIS — S81812A Laceration without foreign body, left lower leg, initial encounter: Secondary | ICD-10-CM | POA: Diagnosis not present

## 2023-07-29 DIAGNOSIS — Z23 Encounter for immunization: Secondary | ICD-10-CM

## 2023-07-29 MED ORDER — DOXYCYCLINE HYCLATE 100 MG PO TABS: 100.0000 mg | ORAL_TABLET | Freq: Two times a day (BID) | ORAL | 0 refills | Status: AC

## 2023-07-29 NOTE — Progress Notes (Signed)
 Subjective:  Patient ID: Clayton Salvo., male    DOB: 19-Jul-1950, 73 y.o.   MRN: [id number]  Patient Care Team: Galvin Jules, FNP as PCP - General (Family Medicine) Reynold Caves, MD as Consulting Physician (Otolaryngology) [facility] Rexene Catching, MD as Consulting Physician (Ophthalmology) Jegede, Olugbemiga E, MD as Consulting Physician (Internal Medicine) Claudene Crystal, RN as Case Manager Rakes, Georgeann Kindred, FNP (Family Medicine) Carlyle Childes Georgeann Kindred, FNP as Attending Physician (Family Medicine)   Chief Complaint:  Laceration  HPI: Clayton Cowher. is a 73 y.o. male presenting on 07/29/2023 for Laceration  HPI Patient was trying to cut guaze to cover bug bite along his left lower leg and accidentally cut his left lower leg ~1.5 inches. States that it is bleeding and is not stopping. He is not currently taking anticoagulation. He has history of CAD, PAD, and T2DM.   Relevant past medical, surgical, family, and social history reviewed and updated as indicated.  Allergies and medications reviewed and updated. Data reviewed: Chart in Epic.   Past Medical History:  Diagnosis Date   Allergy    seasonal   Arthritis    " all over me- back, hips, knees, hands, everywhere "   Cancer [facility])    Prostate cancer age 74; Lupron treatments followed by prostatectomy.   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   OSA (obstructive sleep apnea)    non-compliant with CPAP.   Sleep apnea    no cpap    Past Surgical History:  Procedure Laterality Date   ABDOMINAL AORTOGRAM W/LOWER EXTREMITY N/A 01/17/2022   Procedure: ABDOMINAL AORTOGRAM W/LOWER EXTREMITY;   Surgeon: Dannis Dy, MD;  Location: [facility] INVASIVE CV LAB;  Service: Cardiovascular;  Laterality: N/A;   ABDOMINAL AORTOGRAM W/LOWER EXTREMITY Bilateral 12/04/2022   Procedure: ABDOMINAL AORTOGRAM W/LOWER EXTREMITY;  Surgeon: Young Hensen, MD;  Location: [facility] INVASIVE CV LAB;  Service: Cardiovascular;  Laterality: Bilateral;  Lysis infuaion via lt Bypass graft   AMPUTATION TOE Left 02/04/2022   Procedure: AMPUTATION  SECOND TOE;  Surgeon: Charity Conch, DPM;  Location: [facility] OR;  Service: Podiatry;  Laterality: Left;   APPENDECTOMY     CHOLECYSTECTOMY     FEMORAL-TIBIAL BYPASS GRAFT Left 01/29/2022   Procedure: LEFT COMMON FEMORAL BYPASS TO ANTERIOR TIBIAL ARTERY;  Surgeon: Young Hensen, MD;  Location: [facility] OR;  Service: Vascular;  Laterality: Left;   PERIPHERAL VASCULAR INTERVENTION  12/05/2022   Procedure: PERIPHERAL VASCULAR INTERVENTION;  Surgeon: Carlene Che, MD;  Location: [facility] INVASIVE CV LAB;  Service: Cardiovascular;;   PERIPHERAL VASCULAR THROMBECTOMY N/A 12/05/2022   Procedure: LYSIS RECHECK;  Surgeon: Carlene Che, MD;  Location: [facility] INVASIVE CV LAB;  Service: Cardiovascular;  Laterality: N/A;   PROSTATE SURGERY     VEIN HARVEST Left 01/29/2022   Procedure: HARVEST OF LEFT LEG GREAT SAPHENOUS VEIN;  Surgeon: Young Hensen, MD;  Location: [facility] OR;  Service: Vascular;  Laterality: Left;    Social History   Socioeconomic History   Marital status: Single    Spouse name: GF-Betty  Number of children: 2   Years of education: 11   Highest education level: Not on file  Occupational History   Occupation: supervisor    Employer: [employer]   Tobacco Use   Smoking status: Never    Passive exposure: Never   Smokeless tobacco: Former    Types: Engineer, drilling   Vaping status: Never Used  Substance and Sexual Activity   Alcohol use: Yes    Comment: social   Drug use: Not Currently   Sexual activity: Not Currently  Other Topics Concern    Not on file  Social History Narrative   Marital status: divorced, girlfriend x 1977     Children: 1      Tobacco: none; chew tobacco      Alcohol: beers on weekends      Exercise: none   Social Drivers of Corporate investment banker Strain: Low Risk  (12/23/2022)   Overall Financial Resource Strain (CARDIA)    Difficulty of Paying Living Expenses: Not hard at all  Food Insecurity: No Food Insecurity (12/16/2022)   Hunger Vital Sign    Worried About Running Out of Food in the Last Year: Never true    Ran Out of Food in the Last Year: Never true  Transportation Needs: No Transportation Needs (12/16/2022)   PRAPARE - Administrator, Civil Service (Medical): No    Lack of Transportation (Non-Medical): No  Physical Activity: Insufficiently Active (12/23/2022)   Exercise Vital Sign    Days of Exercise per Week: 4 days    Minutes of Exercise per Session: 10 min  Stress: No Stress Concern Present (12/23/2022)   [employer] of Occupational Health - Occupational Stress Questionnaire    Feeling of Stress : Not at all  Social Connections: Socially Isolated (12/16/2022)   Social Connection and Isolation Panel [NHANES]    Frequency of Communication with Friends and Family: More than three times a week    Frequency of Social Gatherings with Friends and Family: More than three times a week    Attends Religious Services: Never    Database administrator or Organizations: No    Attends Banker Meetings: Never    Marital Status: Divorced  Catering manager Violence: Not At Risk (12/16/2022)   Humiliation, Afraid, Rape, and Kick questionnaire    Fear of Current or Ex-Partner: No    Emotionally Abused: No    Physically Abused: No    Sexually Abused: No    Outpatient Encounter Medications as of 07/29/2023  Medication Sig   apixaban  (ELIQUIS ) 5 MG TABS tablet Take 1 tablet (5 mg total) by mouth 2 (two) times daily.   aspirin  81 MG tablet Take 81 mg by mouth daily.    BAYER CONTOUR NEXT TEST test strip CHECK BLOOD SUGAR TWICE DAILY   BAYER MICROLET LANCETS lancets Check blood sugar two times daily.   Blood Glucose Monitoring Suppl (BAYER CONTOUR NEXT MONITOR) w/Device KIT Check blood sugar two times daily.   ciprofloxacin  (CILOXAN ) 0.3 % ophthalmic solution Place 1 drop into both eyes See admin instructions. NSTILL ONE DROP INTO BOTH EYES 4 TIMES A DAY FOR TWO DAYS AFTER EACH MONTHLY EYE INJECTION   fluticasone  (FLONASE ) 50 MCG/ACT nasal spray Place 1 spray into both nostrils daily as needed for allergies or rhinitis.   gabapentin  (NEURONTIN ) 300 MG capsule Take 3 capsules (900 mg total) by mouth 2 (two) times daily.   lisinopril  (ZESTRIL ) 5 MG tablet Take 1  tablet (5 mg total) by mouth daily.   metFORMIN  (GLUCOPHAGE ) 500 MG tablet Take 3 tablets (1,500 mg total) by mouth 2 (two) times daily with a meal.   mupirocin  ointment (BACTROBAN ) 2 % Apply 1 Application topically 2 (two) times daily.   pantoprazole  (PROTONIX ) 40 MG tablet TAKE 1 TABLET (40 MG TOTAL) BY MOUTH TWICE A DAY BEFORE MEALS   rosuvastatin  (CRESTOR ) 20 MG tablet Take 1 tablet (20 mg total) by mouth daily.   No facility-administered encounter medications on file as of 07/29/2023.    Allergies  Allergen Reactions   Cymbalta  [Duloxetine  Hcl] Nausea And Vomiting   Darvocet [Propoxyphene N-Acetaminophen ] Hives    Review of Systems As per HPI  Objective:  BP (!) 143/75   Pulse 75   Temp (!) 97.1 F (36.2 C)   SpO2 98%    Wt Readings from Last 3 Encounters:  07/21/23 221 lb 6.4 oz (100.4 kg)  04/17/23 224 lb 9.6 oz (101.9 kg)  02/03/23 219 lb (99.3 kg)   Physical Exam Constitutional:      General: He is awake. He is not in acute distress.    Appearance: Normal appearance. He is well-groomed. He is obese. He is not ill-appearing, toxic-appearing or diaphoretic.  Cardiovascular:     Rate and Rhythm: Normal rate and regular rhythm.  Pulmonary:     Effort: Pulmonary effort is  normal.     Breath sounds: Normal breath sounds.  Skin:    Comments: ~1.5 inch laceration on left lower extremity   Neurological:     General: No focal deficit present.     Mental Status: He is alert, oriented to person, place, and time and easily aroused. Mental status is at baseline.  Psychiatric:        Attention and Perception: Attention and perception normal.        Mood and Affect: Mood and affect normal.        Speech: Speech normal.        Behavior: Behavior normal. Behavior is cooperative.        Thought Content: Thought content normal.        Cognition and Memory: Cognition and memory normal.        Judgment: Judgment normal.      Results for orders placed or performed in visit on 07/21/23  Bayer DCA Hb A1c Waived   Collection Time: 07/21/23  9:19 AM  Result Value Ref Range   HB A1C (BAYER DCA - WAIVED) 7.3 (H) 4.8 - 5.6 %  Thyroid  Panel With TSH   Collection Time: 07/21/23  9:24 AM  Result Value Ref Range   TSH 3.670 0.450 - 4.500 uIU/mL   T4, Total 8.0 4.5 - 12.0 ug/dL   T3 Uptake Ratio 28 24 - 39 %   Free Thyroxine Index 2.2 1.2 - 4.9       07/21/2023    9:29 AM 01/07/2023    8:54 AM 12/16/2022   10:30 AM 12/01/2022    1:42 PM 10/07/2022    8:15 AM  Depression screen PHQ 2/9  Decreased Interest 2 0 1 0 0  Down, Depressed, Hopeless 0 0 1 0 0  PHQ - 2 Score 2 0 2 0 0  Altered sleeping 0 0 1 0 2  Tired, decreased energy 2 0 1 0 2  Change in appetite 2 0 0 0 0  Feeling bad or failure about yourself  0  0 0 0  Trouble concentrating 2 0 0 0  0  Moving slowly or fidgety/restless 0 0 0 0 0  Suicidal thoughts 0 0 0 0 0  PHQ-9 Score 8 0 4 0 4  Difficult doing work/chores Not difficult at all Not difficult at all Somewhat difficult Not difficult at all Not difficult at all       07/21/2023    9:29 AM 01/07/2023    8:55 AM 03/05/2022    9:47 AM  GAD 7 : Generalized Anxiety Score  Nervous, Anxious, on Edge 0 0 0  Control/stop worrying 0 0 0  Worry too much -  different things 0 0 0  Trouble relaxing 1 0 0  Restless 0 0 0  Easily annoyed or irritable 1 0 0  Afraid - awful might happen 0 0 0  Total GAD 7 Score 2 0 0  Anxiety Difficulty Not difficult at all Not difficult at all Not difficult at all    Laceration repair  Date/Time: 07/29/2023 11:32 AM  Performed by: Chrystine Crate, FNP Authorized by: Chrystine Crate, FNP   Consent:    Consent obtained:  Verbal   Consent given by:  Patient   Risks, benefits, and alternatives were discussed: yes     Risks discussed:  Infection, pain, need for additional repair, poor cosmetic result, poor wound healing and vascular damage   Alternatives discussed:  Observation Anesthesia:    Anesthesia method:  Local infiltration   Local anesthetic:  Lidocaine  2% WITH epi Laceration details:    Location:  Leg   Leg location:  L lower leg   Length (cm):  3.5 Pre-procedure details:    Preparation:  Patient was prepped and draped in usual sterile fashion Exploration:    Limited defect created (wound extended): no     Hemostasis achieved with:  Direct pressure   Contaminated: no   Treatment:    Area cleansed with:  Povidone-iodine  and saline   Amount of cleaning:  Standard   Irrigation solution:  Sterile saline   Irrigation volume:  30 ml   Irrigation method:  Syringe   Visualized foreign bodies/material removed: no     Debridement:  Minimal   Undermining:  Minimal   Scar revision: no     Layers/structures repaired:  Deep subcutaneous Deep subcutaneous:    Suture size:  3-0 Skin repair:    Repair method:  Sutures   Suture size:  4-0   Suture technique:  Simple interrupted   Number of sutures:  5 Approximation:    Approximation:  Close Repair type:    Repair type:  Simple Post-procedure details:    Dressing:  Sterile dressing   Procedure completion:  Tolerated well, no immediate complications  Pertinent labs & imaging results that were available during my care of the patient  were reviewed by me and considered in my medical decision making.  Assessment & Plan:  [name] was seen today for laceration.  Diagnoses and all orders for this visit:  Laceration of left lower extremity, initial encounter 5 simple interrupted stiches placed. Tolerated well. Discussed at home. Discussed signs and symptoms to monitor for infection. Will cover with abx below. Provided tdap.  -     Laceration repair -     doxycycline  (VIBRA -TABS) 100 MG tablet; Take 1 tablet (100 mg total) by mouth 2 (two) times daily for 7 days. -     Tdap vaccine greater than or equal to 7yo IM    Continue all other maintenance medications.  Follow up plan: Return in about  10 days (around 08/08/2023) for suture removal .   Continue healthy lifestyle choices, including diet (rich in fruits, vegetables, and lean proteins, and low in salt and simple carbohydrates) and exercise (at least 30 minutes of moderate physical activity daily).  Written and verbal instructions provided   The above assessment and management plan was discussed with the patient. The patient verbalized understanding of and has agreed to the management plan. Patient is aware to call the clinic if they develop any new symptoms or if symptoms persist or worsen. Patient is aware when to return to the clinic for a follow-up visit. Patient educated on when it is appropriate to go to the emergency department.   Clayton Mates, DNP-FNP [facility] [address] [place], [place] [address] [phone]

## 2023-07-30 ENCOUNTER — Other Ambulatory Visit (INDEPENDENT_AMBULATORY_CARE_PROVIDER_SITE_OTHER): Admitting: Pharmacist

## 2023-07-30 DIAGNOSIS — I739 Peripheral vascular disease, unspecified: Secondary | ICD-10-CM

## 2023-07-30 NOTE — Progress Notes (Unsigned)
    Called patient to follow up and see if he was able to contact his insurance to enroll in the Medicare Prescription Payment Plan to help with the cost of Eliquis , but he has not done so yet. He also has not reached out to [facility] regarding alternative therapy. Re-discussed the importance of this and encouraged him to reach out to [facility] for assistance which he said he would do.     Georga Killings, PharmD PGY-1 Pharmacy Resident  Marvell Slider, PharmD, BCACP, CPP Clinical Pharmacist, [facility]

## 2023-08-04 ENCOUNTER — Other Ambulatory Visit: Payer: Medicare HMO

## 2023-08-04 ENCOUNTER — Ambulatory Visit: Payer: Medicare HMO | Admitting: Vascular Surgery

## 2023-08-10 ENCOUNTER — Encounter: Payer: Self-pay | Admitting: Family Medicine

## 2023-08-10 ENCOUNTER — Ambulatory Visit: Admitting: Family

## 2023-08-12 ENCOUNTER — Encounter: Payer: Self-pay | Admitting: Family Medicine

## 2023-08-12 ENCOUNTER — Ambulatory Visit (INDEPENDENT_AMBULATORY_CARE_PROVIDER_SITE_OTHER): Admitting: Family Medicine

## 2023-08-12 VITALS — BP 138/80 | HR 92 | Temp 98.2°F | Ht 70.5 in | Wt 222.8 lb

## 2023-08-12 DIAGNOSIS — Z4802 Encounter for removal of sutures: Secondary | ICD-10-CM | POA: Diagnosis not present

## 2023-08-12 DIAGNOSIS — S81812D Laceration without foreign body, left lower leg, subsequent encounter: Secondary | ICD-10-CM

## 2023-08-12 DIAGNOSIS — S81812A Laceration without foreign body, left lower leg, initial encounter: Secondary | ICD-10-CM

## 2023-08-12 NOTE — Progress Notes (Signed)
 Subjective:  Patient ID: Clayton Salvo., male    DOB: 06-Jul-1950, 73 y.o.   MRN: [id number]  Patient Care Team: Galvin Jules, FNP as PCP - General (Family Medicine) Reynold Caves, MD as Consulting Physician (Otolaryngology) [facility] Rexene Catching, MD as Consulting Physician (Ophthalmology) Jegede, Olugbemiga E, MD as Consulting Physician (Internal Medicine) Claudene Crystal, RN as Case Manager Micayla Brathwaite, Georgeann Kindred, FNP (Family Medicine) Carlyle Childes Georgeann Kindred, FNP as Attending Physician (Family Medicine)   Chief Complaint:  Suture / Staple Removal (Laceration of lower extremity )   HPI: [name] is a 73 y.o. male presenting on 08/12/2023 for Suture / Staple Removal (Laceration of lower extremity )   Pt presents today for suture removal, he had a left lower leg laceration which required 5 sutures to be placed on 07/29/2023. He has been keeping the wound clean and dry. He states he has been placing an ointment on the wound that the foot doctor recommended. He denies fever, chills, increased erythema or drainage.    Relevant past medical, surgical, family, and social history reviewed and updated as indicated.  Allergies and medications reviewed and updated. Data reviewed: Chart in Epic.   Past Medical History:  Diagnosis Date   Allergy    seasonal   Arthritis     all over me- back, hips, knees, hands, everywhere    Cancer [facility])    Prostate cancer age 73; Lupron treatments followed by prostatectomy.   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   OSA (obstructive sleep apnea)    non-compliant with CPAP.   Sleep apnea    no cpap    Past  Surgical History:  Procedure Laterality Date   ABDOMINAL AORTOGRAM W/LOWER EXTREMITY N/A 01/17/2022   Procedure: ABDOMINAL AORTOGRAM W/LOWER EXTREMITY;  Surgeon: Dannis Dy, MD;  Location: [facility] INVASIVE CV LAB;  Service: Cardiovascular;  Laterality: N/A;   ABDOMINAL AORTOGRAM W/LOWER EXTREMITY Bilateral 12/04/2022   Procedure: ABDOMINAL AORTOGRAM W/LOWER EXTREMITY;  Surgeon: Young Hensen, MD;  Location: [facility] INVASIVE CV LAB;  Service: Cardiovascular;  Laterality: Bilateral;  Lysis infuaion via lt Bypass graft   AMPUTATION TOE Left 02/04/2022   Procedure: AMPUTATION  SECOND TOE;  Surgeon: Charity Conch, DPM;  Location: [facility] OR;  Service: Podiatry;  Laterality: Left;   APPENDECTOMY     CHOLECYSTECTOMY     FEMORAL-TIBIAL BYPASS GRAFT Left 01/29/2022   Procedure: LEFT COMMON FEMORAL BYPASS TO ANTERIOR TIBIAL ARTERY;  Surgeon: Young Hensen, MD;  Location[facility] OR;  Service: Vascular;  Laterality: Left;   PERIPHERAL VASCULAR INTERVENTION  12/05/2022   Procedure: PERIPHERAL VASCULAR INTERVENTION;  Surgeon: Carlene Che, MD;  Location: [facility] INVASIVE CV LAB;  Service: Cardiovascular;;   PERIPHERAL VASCULAR THROMBECTOMY N/A 12/05/2022   Procedure: LYSIS RECHECK;  Surgeon: Carlene Che, MD;  Location: [facility] INVASIVE CV LAB;  Service: Cardiovascular;  Laterality: N/A;   PROSTATE SURGERY     VEIN HARVEST Left 01/29/2022   Procedure: HARVEST OF LEFT LEG GREAT SAPHENOUS VEIN;  Surgeon: Young Hensen, MD;  Location: [facility] OR;  Service: Vascular;  Laterality: Left;    Social History   Socioeconomic History   Marital status: Single    Spouse name: GF-Betty   Number of children: 2   Years of education: 11   Highest education level: Not on file  Occupational History   Occupation: Event organiser: [employer]   Tobacco Use   Smoking status: Never    Passive exposure: Never   Smokeless tobacco: Former    Types: Engineer, drilling   Vaping status: Never Used   Substance and Sexual Activity   Alcohol use: Yes    Comment: social   Drug use: Not Currently   Sexual activity: Not Currently  Other Topics Concern   Not on file  Social History Narrative   Marital status: divorced, girlfriend x 1977     Children: 1      Tobacco: none; chew tobacco      Alcohol: beers on weekends      Exercise: none   Social Drivers of Corporate investment banker Strain: Low Risk  (12/23/2022)   Overall Financial Resource Strain (CARDIA)    Difficulty of Paying Living Expenses: Not hard at all  Food Insecurity: No Food Insecurity (12/16/2022)   Hunger Vital Sign    Worried About Running Out of Food in the Last Year: Never true    Ran Out of Food in the Last Year: Never true  Transportation Needs: No Transportation Needs (12/16/2022)   PRAPARE - Administrator, Civil Service (Medical): No    Lack of Transportation (Non-Medical): No  Physical Activity: Insufficiently Active (12/23/2022)   Exercise Vital Sign    Days of Exercise per Week: 4 days    Minutes of Exercise per Session: 10 min  Stress: No Stress Concern Present (12/23/2022)   [employer] of Occupational Health - Occupational Stress Questionnaire    Feeling of Stress : Not at all  Social Connections: Socially Isolated (12/16/2022)   Social Connection and Isolation Panel [NHANES]    Frequency of Communication with Friends and Family: More than three times a week    Frequency of Social Gatherings with Friends and Family: More than three times a week    Attends Religious Services: Never    Database administrator or Organizations: No    Attends Banker Meetings: Never    Marital Status: Divorced  Catering manager Violence: Not At Risk (12/16/2022)   Humiliation, Afraid, Rape, and Kick questionnaire    Fear of Current or Ex-Partner: No    Emotionally Abused: No    Physically Abused: No    Sexually Abused: No    Outpatient Encounter Medications as of 08/12/2023   Medication Sig   apixaban  (ELIQUIS ) 5 MG TABS tablet Take 1 tablet (5 mg total) by mouth 2 (two) times daily.   aspirin  81 MG tablet Take 81 mg by mouth daily.   BAYER CONTOUR NEXT TEST test strip CHECK BLOOD SUGAR TWICE DAILY   BAYER MICROLET LANCETS lancets Check blood sugar two times daily.   Blood Glucose Monitoring Suppl (BAYER CONTOUR NEXT MONITOR) w/Device KIT Check blood sugar two times daily.   ciprofloxacin  (CILOXAN ) 0.3 % ophthalmic solution Place 1 drop into both eyes See admin instructions. NSTILL ONE DROP INTO BOTH EYES 4 TIMES A DAY FOR TWO DAYS AFTER EACH MONTHLY EYE INJECTION   fluticasone  (FLONASE ) 50 MCG/ACT nasal spray Place 1 spray into both nostrils daily as needed for allergies or rhinitis.  lisinopril  (ZESTRIL ) 5 MG tablet Take 1 tablet (5 mg total) by mouth daily.   metFORMIN  (GLUCOPHAGE ) 500 MG tablet Take 3 tablets (1,500 mg total) by mouth 2 (two) times daily with a meal.   mupirocin  ointment (BACTROBAN ) 2 % Apply 1 Application topically 2 (two) times daily.   pantoprazole  (PROTONIX ) 40 MG tablet TAKE 1 TABLET (40 MG TOTAL) BY MOUTH TWICE A DAY BEFORE MEALS   rosuvastatin  (CRESTOR ) 20 MG tablet Take 1 tablet (20 mg total) by mouth daily.   gabapentin  (NEURONTIN ) 300 MG capsule Take 3 capsules (900 mg total) by mouth 2 (two) times daily.   No facility-administered encounter medications on file as of 08/12/2023.    Allergies  Allergen Reactions   Cymbalta  [Duloxetine  Hcl] Nausea And Vomiting   Darvocet [Propoxyphene N-Acetaminophen ] Hives    ROS per HPI     Objective:  BP 138/80   Pulse 92   Temp 98.2 F (36.8 C)   Ht 5' 10.5 (1.791 m)   Wt 222 lb 12.8 oz (101.1 kg)   SpO2 95%   BMI 31.52 kg/m    Wt Readings from Last 3 Encounters:  08/12/23 222 lb 12.8 oz (101.1 kg)  07/21/23 221 lb 6.4 oz (100.4 kg)  04/17/23 224 lb 9.6 oz (101.9 kg)    Physical Exam Vitals and nursing note reviewed.  Constitutional:      Appearance: Normal appearance. He  is obese.  HENT:     Head: Normocephalic and atraumatic.     Mouth/Throat:     Mouth: Mucous membranes are moist.  Cardiovascular:     Rate and Rhythm: Normal rate and regular rhythm.     Heart sounds: Normal heart sounds.  Pulmonary:     Effort: Pulmonary effort is normal.     Breath sounds: Normal breath sounds.  Skin:    General: Skin is warm and dry.     Capillary Refill: Capillary refill takes less than 2 seconds.       Neurological:     General: No focal deficit present.     Mental Status: He is alert and oriented to person, place, and time.     Gait: Gait abnormal (slow).  Psychiatric:        Mood and Affect: Mood normal.        Behavior: Behavior normal.        Thought Content: Thought content normal.        Judgment: Judgment normal.     Results for orders placed or performed in visit on 07/21/23  Bayer DCA Hb A1c Waived   Collection Time: 07/21/23  9:19 AM  Result Value Ref Range   HB A1C (BAYER DCA - WAIVED) 7.3 (H) 4.8 - 5.6 %  Thyroid  Panel With TSH   Collection Time: 07/21/23  9:24 AM  Result Value Ref Range   TSH 3.670 0.450 - 4.500 uIU/mL   T4, Total 8.0 4.5 - 12.0 ug/dL   T3 Uptake Ratio 28 24 - 39 %   Free Thyroxine Index 2.2 1.2 - 4.9     Suture Removal  Date/Time: 08/12/2023 2:59 PM  Performed by: Galvin Jules, FNP Authorized by: Galvin Jules, FNP  Body area: lower extremity Location details: left lower leg Wound Appearance: clean Sutures Removed: 5 Post-removal: dressing applied and antibiotic ointment applied Facility: sutures placed in this facility Patient tolerance: patient tolerated the procedure well with no immediate complications      Pertinent labs & imaging results that were available  during my care of the patient were reviewed by me and considered in my medical decision making.  Assessment & Plan:  [name] was seen today for suture / staple removal.  Diagnoses and all orders for this visit:  Laceration of left lower  extremity, initial encounter -     Suture Removal  Visit for suture removal -     Suture Removal   Continue all other maintenance medications.  Follow up plan: Return if symptoms worsen or fail to improve.   Continue healthy lifestyle choices, including diet (rich in fruits, vegetables, and lean proteins, and low in salt and simple carbohydrates) and exercise (at least 30 minutes of moderate physical activity daily).  Educational handout given for wound care  The above assessment and management plan was discussed with the patient. The patient verbalized understanding of and has agreed to the management plan. Patient is aware to call the clinic if they develop any new symptoms or if symptoms persist or worsen. Patient is aware when to return to the clinic for a follow-up visit. Patient educated on when it is appropriate to go to the emergency department.   Kattie Parrot, FNP-C [facility] [phone]

## 2023-08-19 ENCOUNTER — Encounter (INDEPENDENT_AMBULATORY_CARE_PROVIDER_SITE_OTHER): Admitting: Ophthalmology

## 2023-08-26 ENCOUNTER — Encounter (INDEPENDENT_AMBULATORY_CARE_PROVIDER_SITE_OTHER): Admitting: Ophthalmology

## 2023-08-26 DIAGNOSIS — H43813 Vitreous degeneration, bilateral: Secondary | ICD-10-CM

## 2023-08-26 DIAGNOSIS — I10 Essential (primary) hypertension: Secondary | ICD-10-CM | POA: Diagnosis not present

## 2023-08-26 DIAGNOSIS — Z7984 Long term (current) use of oral hypoglycemic drugs: Secondary | ICD-10-CM

## 2023-08-26 DIAGNOSIS — H35033 Hypertensive retinopathy, bilateral: Secondary | ICD-10-CM | POA: Diagnosis not present

## 2023-08-26 DIAGNOSIS — E113313 Type 2 diabetes mellitus with moderate nonproliferative diabetic retinopathy with macular edema, bilateral: Secondary | ICD-10-CM

## 2023-09-10 ENCOUNTER — Ambulatory Visit (INDEPENDENT_AMBULATORY_CARE_PROVIDER_SITE_OTHER): Admitting: Family Medicine

## 2023-09-10 ENCOUNTER — Encounter: Payer: Self-pay | Admitting: Family Medicine

## 2023-09-10 VITALS — BP 119/65 | HR 71 | Ht 70.5 in | Wt 226.0 lb

## 2023-09-10 DIAGNOSIS — L03115 Cellulitis of right lower limb: Secondary | ICD-10-CM | POA: Diagnosis not present

## 2023-09-10 DIAGNOSIS — L03116 Cellulitis of left lower limb: Secondary | ICD-10-CM

## 2023-09-10 MED ORDER — MUPIROCIN 2 % EX OINT: 1.0000 | TOPICAL_OINTMENT | Freq: Two times a day (BID) | CUTANEOUS | 2 refills | Status: DC

## 2023-09-10 MED ORDER — SULFAMETHOXAZOLE-TRIMETHOPRIM 800-160 MG PO TABS: 1.0000 | ORAL_TABLET | Freq: Two times a day (BID) | ORAL | 0 refills | Status: DC

## 2023-09-10 NOTE — Progress Notes (Signed)
 BP 119/65   Pulse 71   Ht 5' 10.5 (1.791 m)   Wt 226 lb (102.5 kg)   SpO2 98%   BMI 31.97 kg/m    Subjective:   Patient ID: [name] Ezra Mickey., male    DOB: 1950/12/10, 73 y.o.   MRN: [id number]  HPI: Clayton Methot. is a 73 y.o. male presenting on 09/10/2023 for Fall (Redness, abrasions BLE)   HPI Lower extremity wounds and cellulitis Patient is coming in today for some wounds on his lower extremities on both legs.  He says he was working on the deck about a week and a half ago when he slipped through some of the rafters and scratched up his legs at that time.  He says he has been trying to use soap and water  and alcohol and peroxide on them to keep the infection away but is still has some redness and pain around some of the spots.  They are all very small scrapes, less than 0.2 cm and he has 2 or 3 on his left leg and 1 on her right toe and 2 or 3 on his right leg as well.  Mostly on the shin of both legs.  He denies any fevers or chills or bodyaches.  Relevant past medical, surgical, family and social history reviewed and updated as indicated. Interim medical history since our last visit reviewed. Allergies and medications reviewed and updated.  Review of Systems  Constitutional:  Negative for chills and fever.  Eyes:  Negative for visual disturbance.  Respiratory:  Negative for shortness of breath and wheezing.   Cardiovascular:  Negative for chest pain and leg swelling.  Musculoskeletal:  Negative for back pain and gait problem.  Skin:  Positive for color change and wound. Negative for rash.  All other systems reviewed and are negative.   Per HPI unless specifically indicated above   Allergies as of 09/10/2023       Reactions   Cymbalta  [duloxetine  Hcl] Nausea And Vomiting   Darvocet [propoxyphene N-acetaminophen ] Hives        Medication List        Accurate as of September 10, 2023 11:58 AM. If you have any questions, ask your nurse or doctor.           apixaban  5 MG Tabs tablet Commonly known as: ELIQUIS  Take 1 tablet (5 mg total) by mouth 2 (two) times daily.   aspirin  81 MG tablet Take 81 mg by mouth daily.   Bayer Contour Next Monitor w/Device Kit Check blood sugar two times daily.   Bayer Contour Next Test test strip Generic drug: glucose blood CHECK BLOOD SUGAR TWICE DAILY   Bayer Microlet Lancets lancets Check blood sugar two times daily.   ciprofloxacin  0.3 % ophthalmic solution Commonly known as: CILOXAN  Place 1 drop into both eyes See admin instructions. NSTILL ONE DROP INTO BOTH EYES 4 TIMES A DAY FOR TWO DAYS AFTER EACH MONTHLY EYE INJECTION   fluticasone  50 MCG/ACT nasal spray Commonly known as: FLONASE  Place 1 spray into both nostrils daily as needed for allergies or rhinitis.   gabapentin  300 MG capsule Commonly known as: NEURONTIN  Take 3 capsules (900 mg total) by mouth 2 (two) times daily.   lisinopril  5 MG tablet Commonly known as: ZESTRIL  Take 1 tablet (5 mg total) by mouth daily.   metFORMIN  500 MG tablet Commonly known as: GLUCOPHAGE  Take 3 tablets (1,500 mg total) by mouth 2 (two) times daily with a meal.  mupirocin  ointment 2 % Commonly known as: BACTROBAN  Apply 1 Application topically 2 (two) times daily.   pantoprazole  40 MG tablet Commonly known as: PROTONIX  TAKE 1 TABLET (40 MG TOTAL) BY MOUTH TWICE A DAY BEFORE MEALS   rosuvastatin  20 MG tablet Commonly known as: CRESTOR  Take 1 tablet (20 mg total) by mouth daily.   sulfamethoxazole -trimethoprim  800-160 MG tablet Commonly known as: BACTRIM  DS Take 1 tablet by mouth 2 (two) times daily. Started by: Fonda LABOR Roslind Michaux         Objective:   BP 119/65   Pulse 71   Ht 5' 10.5 (1.791 m)   Wt 226 lb (102.5 kg)   SpO2 98%   BMI 31.97 kg/m   Wt Readings from Last 3 Encounters:  09/10/23 226 lb (102.5 kg)  08/12/23 222 lb 12.8 oz (101.1 kg)  07/21/23 221 lb 6.4 oz (100.4 kg)    Physical Exam Vitals and nursing note  reviewed.  Constitutional:      General: He is not in acute distress.    Appearance: He is well-developed. He is not diaphoretic.  Eyes:     General: No scleral icterus.    Conjunctiva/sclera: Conjunctivae normal.  Neck:     Thyroid : No thyromegaly.  Skin:    General: Skin is warm and dry.     Findings: Wound (Multiple small less than 0.2 cm superficial wounds, 2 or 3 of them have a slight surrounding erythema, no induration or purulence noted on any of them.) present. No rash.     Comments: Wounds on both shins and right second toe.  Neurological:     Mental Status: He is alert and oriented to person, place, and time.     Coordination: Coordination normal.  Psychiatric:        Behavior: Behavior normal.       Assessment & Plan:   Problem List Items Addressed This Visit   None Visit Diagnoses       Cellulitis of left lower extremity    -  Primary   Relevant Medications   sulfamethoxazole -trimethoprim  (BACTRIM  DS) 800-160 MG tablet   mupirocin  ointment (BACTROBAN ) 2 %     Cellulitis of right lower extremity       Relevant Medications   sulfamethoxazole -trimethoprim  (BACTRIM  DS) 800-160 MG tablet   mupirocin  ointment (BACTROBAN ) 2 %       Will give antibiotic and topical antibiotic as well, he is to monitor the wounds closely, nothing deep at this point but if anything starts looking worse then he needs to get back in to see us . Follow up plan: Return if symptoms worsen or fail to improve.  Counseling provided for all of the vaccine components No orders of the defined types were placed in this encounter.   Fonda Levins, MD [facility] 09/10/2023, 11:58 AM

## 2023-09-15 ENCOUNTER — Ambulatory Visit: Payer: Medicare HMO | Admitting: Vascular Surgery

## 2023-09-15 ENCOUNTER — Ambulatory Visit

## 2023-09-15 ENCOUNTER — Other Ambulatory Visit: Payer: Medicare HMO

## 2023-09-15 DIAGNOSIS — I70222 Atherosclerosis of native arteries of extremities with rest pain, left leg: Secondary | ICD-10-CM

## 2023-09-16 LAB — VAS US ABI WITH/WO TBI: Left ABI: 0.57

## 2023-09-18 ENCOUNTER — Ambulatory Visit: Payer: Self-pay

## 2023-09-18 DIAGNOSIS — L03116 Cellulitis of left lower limb: Secondary | ICD-10-CM

## 2023-09-18 DIAGNOSIS — L03115 Cellulitis of right lower limb: Secondary | ICD-10-CM

## 2023-09-18 NOTE — Telephone Encounter (Signed)
 FYI Only or Action Required?: Action required by provider: medication refill request.  Patient was last seen in primary care on 09/10/2023 by Dettinger, Fonda LABOR, MD.  Called Nurse Triage reporting Leg Pain.  Symptoms began a week ago.  Interventions attempted: Prescription medications: antibiotic and ointment.  Symptoms are: gradually improving.  Triage Disposition: See PCP When Office is Open (Within 3 Days)  Patient/caregiver understands and will follow disposition?: Yes     Copied from CRM [id number]. Topic: Clinical - Red Word Triage >> Sep 18, 2023 12:02 PM Turkey B wrote: [facility] that prompted transfer to Nurse Triage:pt has pain at a level 5 in legs, requesting more ointment Reason for Disposition  Sounds like a recheck is needed to the triager  Answer Assessment - Initial Assessment Questions 1. SYMPTOM: What's the main symptom you're concerned about? (e.g., redness, swelling, pain, fever, weakness)     Redness around one area 2. CELLULITIS LOCATION: Where is the cellulitis located? (e.g., hand, arm, foot, leg, face)     legs 3. CELLULITIS SIZE: What is the size of the red area? (e.g., inches, centimeters; compare to size of a coin).     Dime size 4. BETTER-SAME-WORSE: Are you getting better, staying the same, or getting worse compared to the day you started the antibiotics?      Getting better 5. PAIN: Do you have any pain?  If Yes, ask: How bad is the pain?  (e.g., Scale 0-10; mild, moderate, or severe)     5/10 6. FEVER: Do you have a fever? If Yes, ask: What is it, how was it measured and when did it start?     no 7. OTHER SYMPTOMS: Do you have any other symptoms? (e.g., pus coming from a wound, red streaks, weakness)     no 8. DIAGNOSIS DATE: When was the cellulitis diagnosed? By whom?      09/10/23 dr Dettringer 9. ANTIBIOTIC NAME: What antibiotic(s) are you taking?  How many times per day? (Be sure the patient is taking the antibiotic  as directed).      bactrim  10. ANTIBIOTIC DATE: When was the antibiotic started?       09/10/23  Protocols used: Cellulitis on Antibiotic Follow-up Call-[facility]

## 2023-09-21 ENCOUNTER — Encounter: Payer: Self-pay | Admitting: Family Medicine

## 2023-09-21 ENCOUNTER — Ambulatory Visit (INDEPENDENT_AMBULATORY_CARE_PROVIDER_SITE_OTHER): Admitting: Family Medicine

## 2023-09-21 VITALS — BP 125/63 | HR 69 | Temp 98.3°F | Ht 70.0 in | Wt 224.0 lb

## 2023-09-21 DIAGNOSIS — L03116 Cellulitis of left lower limb: Secondary | ICD-10-CM | POA: Diagnosis not present

## 2023-09-21 MED ORDER — SULFAMETHOXAZOLE-TRIMETHOPRIM 800-160 MG PO TABS: 1.0000 | ORAL_TABLET | Freq: Two times a day (BID) | ORAL | 0 refills | Status: AC

## 2023-09-21 NOTE — Progress Notes (Signed)
 Patient name: [name] MRN: [id number] DOB: 1950-04-06 Sex: male  REASON FOR VISIT: Follow-up  HPI: Benjamine Strout. is a 73 y.o. male with multiple comorbidities that presents for hospital follow-up.  Patient previously underwent a left common femoral to anterior tibial bypass with ipsilateral nonreversed great saphenous vein on 01/29/22 for CLI with tissue loss.  Ultimately his wounds healed.  Most recently was undergoing angiogram on 12/04/2022 to evaluate his right leg PAD and was found to have an occluded left leg bypass.  We then stuck the contralateral groin and performed left leg lytics.  He underwent left DP AT angioplasty on the lytics takeback on 12/05/2022.  Duplex on 01/01/2023 after recent intervention shows patent left leg bypass with no stenosis.  Remains on aspirin  and Eliquis .  States he is getting treatment for his neuropathy in his legs.  09/22/23: Patient returns to clinic for surveillance of lower extremity symptoms.  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  He reports aching pain in the legs with activity.  He is able to work, but is difficult for him.  He did suffer traumatic injury while building the deck's, and has healed small lacerations in his legs.  He reports significant discomfort in his feet.  He is description is not typical of ischemic rest pain.  He reports pain begins when he sits down to rest after working and improves with walking.  He describes the pain is mostly in his ankles and hindfoot.  He does report fairly severe claudication symptoms, but is able to continue working with them.  He has not taken Eliquis  because he cannot afford it.  Past Medical History:  Diagnosis Date   Allergy    seasonal   Arthritis     all over me- back, hips, knees, hands, everywhere    Cancer [facility])    Prostate cancer age 65; Lupron treatments followed by prostatectomy.   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   OSA (obstructive sleep apnea)    non-compliant with CPAP.   Sleep apnea    no cpap    Past Surgical History:  Procedure Laterality Date   ABDOMINAL AORTOGRAM W/LOWER EXTREMITY N/A 01/17/2022   Procedure: ABDOMINAL AORTOGRAM W/LOWER EXTREMITY;  Surgeon: Eliza Lonni RAMAN, MD;  [facility]: [facility] INVASIVE CV LAB;  Service: Cardiovascular;  Laterality: N/A;   ABDOMINAL AORTOGRAM W/LOWER EXTREMITY Bilateral 12/04/2022   Procedure: ABDOMINAL AORTOGRAM W/LOWER EXTREMITY;  Surgeon: Gretta Lonni PARAS, MD;  Location: [facility] INVASIVE CV LAB;  Service: Cardiovascular;  Laterality: Bilateral;  Lysis infuaion via lt Bypass graft   AMPUTATION TOE Left 02/04/2022   Procedure: AMPUTATION  SECOND TOE;  Surgeon: Gershon Donnice JONELLE, DPM;  Location: [facility] OR;  Service: Podiatry;  Laterality: Left;   APPENDECTOMY     CHOLECYSTECTOMY     FEMORAL-TIBIAL BYPASS GRAFT Left 01/29/2022   Procedure: LEFT COMMON FEMORAL BYPASS TO ANTERIOR TIBIAL ARTERY;  Surgeon: Gretta Lonni PARAS, MD;  Location: [facility] OR;  Service: Vascular;  Laterality: Left;   PERIPHERAL VASCULAR INTERVENTION  12/05/2022   Procedure: PERIPHERAL VASCULAR  INTERVENTION;  Surgeon: Magda Debby SAILOR, MD;  Location: [facility] INVASIVE CV LAB;  Service: Cardiovascular;;   PERIPHERAL VASCULAR THROMBECTOMY N/A 12/05/2022   Procedure: LYSIS RECHECK;  Surgeon: Magda Debby SAILOR, MD;  Location: [facility] INVASIVE CV LAB;  Service: Cardiovascular;  Laterality: N/A;   PROSTATE SURGERY     VEIN HARVEST Left 01/29/2022   Procedure: HARVEST OF LEFT LEG GREAT SAPHENOUS VEIN;  Surgeon: Gretta Lonni PARAS, MD;  Location: [facility] OR;  Service: Vascular;  Laterality: Left;    Family History   Problem Relation Age of Onset   Cancer Mother        leukemia   Diabetes Mother    Cancer Father 25       lung cancer with mets   Colon cancer Maternal Grandmother    Heart disease Maternal Grandmother    Heart disease Maternal Grandfather    Heart disease Paternal Grandmother    Cancer Paternal Grandfather        lung, prostate   Esophageal cancer Neg Hx    Colon polyps Neg Hx    Stomach cancer Neg Hx    Rectal cancer Neg Hx     SOCIAL HISTORY: Social History   Tobacco Use   Smoking status: Never    Passive exposure: Never   Smokeless tobacco: Former    Types: Chew  Substance Use Topics   Alcohol use: Yes    Comment: social    Allergies  Allergen Reactions   Cymbalta  [Duloxetine  Hcl] Nausea And Vomiting   Darvocet [Propoxyphene N-Acetaminophen ] Hives    Current Outpatient Medications  Medication Sig Dispense Refill   aspirin  81 MG tablet Take 81 mg by mouth daily.     BAYER CONTOUR NEXT TEST test strip CHECK BLOOD SUGAR TWICE DAILY 100 each 5   BAYER MICROLET LANCETS lancets Check blood sugar two times daily. 100 each 5   Blood Glucose Monitoring Suppl (BAYER CONTOUR NEXT MONITOR) w/Device KIT Check blood sugar two times daily. 1 kit 2   ciprofloxacin  (CILOXAN ) 0.3 % ophthalmic solution Place 1 drop into both eyes See admin instructions. NSTILL ONE DROP INTO BOTH EYES 4 TIMES A DAY FOR TWO DAYS AFTER EACH MONTHLY EYE INJECTION     clopidogrel  (PLAVIX ) 75 MG tablet Take 1 tablet (75 mg total) by mouth daily. 90 tablet 6   fluticasone  (FLONASE ) 50 MCG/ACT nasal spray Place 1 spray into both nostrils daily as needed for allergies or rhinitis.     gabapentin  (NEURONTIN ) 300 MG capsule Take 3 capsules (900 mg total) by mouth 2 (two) times daily. 540 capsule 0   lisinopril  (ZESTRIL ) 5 MG tablet Take 1 tablet (5 mg total) by mouth daily. 90 tablet 3   metFORMIN  (GLUCOPHAGE ) 500 MG tablet Take 3 tablets (1,500 mg total) by mouth 2 (two) times daily with a meal. 540 tablet 3    mupirocin  ointment (BACTROBAN ) 2 % Apply 1 Application topically 2 (two) times daily. 30 g 2   pantoprazole  (PROTONIX ) 40 MG tablet TAKE 1 TABLET (40 MG TOTAL) BY MOUTH TWICE A DAY BEFORE MEALS 180 tablet 0   rosuvastatin  (CRESTOR ) 20 MG tablet Take 1 tablet (20 mg total) by mouth daily. 30 tablet 11   sulfamethoxazole -trimethoprim  (BACTRIM  DS) 800-160 MG tablet Take 1 tablet by mouth 2 (two) times daily for 10 days. 20 tablet 0   apixaban  (ELIQUIS ) 5 MG TABS tablet Take 1 tablet (5 mg total) by mouth 2 (two) times daily. (Patient not taking: Reported on 09/22/2023) 60 tablet 11  No current facility-administered medications for this visit.    PHYSICAL EXAM: Vitals:   09/22/23 1010  BP: 120/65  Pulse: 65  Weight: 230 lb (104.3 kg)  Height: 5' 10 (1.778 m)   Elderly man in no distress Regular rate and rhythm Unlabored breathing No palpable pedal pulses Traumatic wounds of the left and right lower extremity have spontaneously healed Left second toe surgically absent  DATA:  +-------+-----------+-----------+------------+------------+  ABI/TBIToday's ABIToday's TBIPrevious ABIPrevious TBI  +-------+-----------+-----------+------------+------------+  Right [place]                    [place]          0.36          +-------+-----------+-----------+------------+------------+  Left  0.57                  1.09        0.82          +-------+-----------+-----------+------------+------------+    Assessment/Plan: 73 y.o. male status post left common femoral to anterior tibial artery bypass with greater saphenous vein 01/29/2022.  He has atypical symptoms of peripheral arterial disease.  Ankle-brachial index in the left has deteriorated since last measurement, but his duplex shows a widely patent bypass graft without stenosis.  I will plan to see him back in short interval follow-up to evaluate his symptoms.  Recommend:  Abstinence from all tobacco products. Blood glucose control  with goal A1c < 7%. Blood pressure control with goal blood pressure < 130/80 mmHg. Lipid reduction therapy with goal LDL-C < 55 mg/dL. Aspirin  81mg  by mouth daily. Add clopidogrel  75 mg by mouth daily Atorvastatin  40-80mg  PO QD (or other high intensity statin therapy). Daily walking to and past the point of discomfort.   Debby SAILOR. Magda, MD [facility] Phone Number: [phone] 09/22/2023 12:33 PM

## 2023-09-21 NOTE — Progress Notes (Signed)
 Subjective: CC: Cellulitis PCP: [name] HERO, FNP YEP:Ijcpi R [name] is a 73 y.o. male presenting to clinic today for:  1.  Cellulitis Patient was seen 11 days ago by Dr. Maryanne, who diagnosed him with cellulitis and prescribed him Septra  along with a topical antibiotic.  He contacted the office on the 18th and noted that symptoms are gradually improving but there was 1 small area that still had some redness and it was recommended that he have that rechecked.  He notes that he still has quite a bit of redness and pain along the left anterior lower leg.  The other lesions seem to mostly have healed up.  He continues to use his topical antibiotic as prescribed.  Reports no fevers or purulence.  He has a history of blockage in that left lower extremity and will be seeing his vascular specialist tomorrow.  He has not been on any anticoagulation in over 8 months due to cost.  Medical history also significant for diabetes   ROS: Per HPI  Allergies  Allergen Reactions   Cymbalta  [Duloxetine  Hcl] Nausea And Vomiting   Darvocet [Propoxyphene N-Acetaminophen ] Hives   Past Medical History:  Diagnosis Date   Allergy    seasonal   Arthritis     all over me- back, hips, knees, hands, everywhere    Cancer (HCC)    Prostate cancer age 77; Lupron treatments followed by prostatectomy.   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   OSA (obstructive sleep apnea)    non-compliant with CPAP.   Sleep apnea    no cpap    Current Outpatient Medications:    apixaban  (ELIQUIS ) 5 MG TABS tablet, Take 1 tablet (5 mg total) by mouth 2 (two) times daily., Disp: 60 tablet, Rfl: 11   aspirin   81 MG tablet, Take 81 mg by mouth daily., Disp: , Rfl:    BAYER CONTOUR NEXT TEST test strip, CHECK BLOOD SUGAR TWICE DAILY, Disp: 100 each, Rfl: 5   BAYER MICROLET LANCETS lancets, Check blood sugar two times daily., Disp: 100 each, Rfl: 5   Blood Glucose Monitoring Suppl (BAYER CONTOUR NEXT MONITOR) w/Device KIT, Check blood sugar two times daily., Disp: 1 kit, Rfl: 2   ciprofloxacin  (CILOXAN ) 0.3 % ophthalmic solution, Place 1 drop into both eyes See admin instructions. NSTILL ONE DROP INTO BOTH EYES 4 TIMES A DAY FOR TWO DAYS AFTER EACH MONTHLY EYE INJECTION, Disp: , Rfl:    fluticasone  (FLONASE ) 50 MCG/ACT nasal spray, Place 1 spray into both nostrils daily as needed for allergies or rhinitis., Disp: , Rfl:    gabapentin  (NEURONTIN ) 300 MG capsule, Take 3 capsules (900 mg total) by mouth 2 (two) times daily., Disp: 540 capsule, Rfl: 0   lisinopril  (ZESTRIL ) 5 MG tablet, Take 1 tablet (5 mg total) by mouth daily., Disp: 90 tablet, Rfl: 3   metFORMIN  (GLUCOPHAGE ) 500 MG tablet, Take 3 tablets (1,500 mg total) by mouth 2 (two) times daily with a meal., Disp: 540 tablet, Rfl: 3   mupirocin  ointment (BACTROBAN ) 2 %, Apply 1 Application topically 2 (two) times daily., Disp: 30 g,  Rfl: 2   pantoprazole  (PROTONIX ) 40 MG tablet, TAKE 1 TABLET (40 MG TOTAL) BY MOUTH TWICE A DAY BEFORE MEALS, Disp: 180 tablet, Rfl: 0   rosuvastatin  (CRESTOR ) 20 MG tablet, Take 1 tablet (20 mg total) by mouth daily., Disp: 30 tablet, Rfl: 11   sulfamethoxazole -trimethoprim  (BACTRIM  DS) 800-160 MG tablet, Take 1 tablet by mouth 2 (two) times daily., Disp: 20 tablet, Rfl: 0 Social History   Socioeconomic History   Marital status: Single    Spouse name: GF-Betty   Number of children: 2   Years of education: 11   Highest education level: Not on file  Occupational History   Occupation: Event organiser: [employer]   Tobacco Use   Smoking status: Never    Passive exposure: Never   Smokeless tobacco:  Former    Types: Engineer, drilling   Vaping status: Never Used  Substance and Sexual Activity   Alcohol use: Yes    Comment: social   Drug use: Not Currently   Sexual activity: Not Currently  Other Topics Concern   Not on file  Social History Narrative   Marital status: divorced, girlfriend x 1977     Children: 1      Tobacco: none; chew tobacco      Alcohol: beers on weekends      Exercise: none   Social Drivers of Corporate investment banker Strain: Low Risk  (12/23/2022)   Overall Financial Resource Strain (CARDIA)    Difficulty of Paying Living Expenses: Not hard at all  Food Insecurity: No Food Insecurity (12/16/2022)   Hunger Vital Sign    Worried About Running Out of Food in the Last Year: Never true    Ran Out of Food in the Last Year: Never true  Transportation Needs: No Transportation Needs (12/16/2022)   PRAPARE - Administrator, Civil Service (Medical): No    Lack of Transportation (Non-Medical): No  Physical Activity: Insufficiently Active (12/23/2022)   Exercise Vital Sign    Days of Exercise per Week: 4 days    Minutes of Exercise per Session: 10 min  Stress: No Stress Concern Present (12/23/2022)   [employer] of Occupational Health - Occupational Stress Questionnaire    Feeling of Stress : Not at all  Social Connections: Socially Isolated (12/16/2022)   Social Connection and Isolation Panel    Frequency of Communication with Friends and Family: More than three times a week    Frequency of Social Gatherings with Friends and Family: More than three times a week    Attends Religious Services: Never    Database administrator or Organizations: No    Attends Banker Meetings: Never    Marital Status: Divorced  Catering manager Violence: Not At Risk (12/16/2022)   Humiliation, Afraid, Rape, and Kick questionnaire    Fear of Current or Ex-Partner: No    Emotionally Abused: No    Physically Abused: No    Sexually Abused: No    Family History  Problem Relation Age of Onset   Cancer Mother        leukemia   Diabetes Mother    Cancer Father 89       lung cancer with mets   Colon cancer Maternal Grandmother    Heart disease Maternal Grandmother    Heart disease Maternal Grandfather    Heart disease Paternal Grandmother    Cancer Paternal Grandfather        lung,  prostate   Esophageal cancer Neg Hx    Colon polyps Neg Hx    Stomach cancer Neg Hx    Rectal cancer Neg Hx     Objective: Office vital signs reviewed. BP 125/63   Pulse 69   Temp 98.3 F (36.8 C)   Ht 5' 10 (1.778 m)   Wt 224 lb (101.6 kg)   SpO2 95%   BMI 32.14 kg/m   Physical Examination:  General: Awake, alert, well nourished, No acute distress Extremities: No gross edema appreciated.  He has a dime sized lesion on the left lower anterior leg that has surrounding erythema and associated eschar.  No palpable fluctuance but it is tender to palpation.  Assessment/ Plan: 73 y.o. male   Cellulitis of left lower extremity - Plan: sulfamethoxazole -trimethoprim  (BACTRIM  DS) 800-160 MG tablet  Overall improving but not resolved and I think this is likely secondary to diabetes and to peripheral artery disease.  Discussed home care instructions and reasons for reevaluation.  He voiced good understanding.  Keep follow-up with vascular specialist tomorrow   Norene CHRISTELLA Fielding, DO [facility] [phone]

## 2023-09-22 ENCOUNTER — Telehealth: Payer: Self-pay | Admitting: Lab

## 2023-09-22 ENCOUNTER — Ambulatory Visit: Admitting: Vascular Surgery

## 2023-09-22 ENCOUNTER — Other Ambulatory Visit: Payer: Self-pay | Admitting: *Deleted

## 2023-09-22 ENCOUNTER — Encounter: Payer: Self-pay | Admitting: Vascular Surgery

## 2023-09-22 ENCOUNTER — Other Ambulatory Visit: Payer: Self-pay | Admitting: Podiatry

## 2023-09-22 VITALS — BP 120/65 | HR 65 | Ht 70.0 in | Wt 230.0 lb

## 2023-09-22 DIAGNOSIS — E1142 Type 2 diabetes mellitus with diabetic polyneuropathy: Secondary | ICD-10-CM

## 2023-09-22 DIAGNOSIS — I739 Peripheral vascular disease, unspecified: Secondary | ICD-10-CM | POA: Diagnosis not present

## 2023-09-22 MED ORDER — CLOPIDOGREL BISULFATE 75 MG PO TABS: 75.0000 mg | ORAL_TABLET | Freq: Every day | ORAL | 6 refills | Status: DC

## 2023-09-22 NOTE — Telephone Encounter (Signed)
 Pt came in today requesting refill on his Gabapentin  Last OV 09/21/23. Last RF by Donnice Fees, podiatrist. Next OV 10/09/23

## 2023-09-22 NOTE — Telephone Encounter (Signed)
 Thanks

## 2023-09-22 NOTE — Telephone Encounter (Signed)
 [facility] PCP reviewed request on refill for Gabapentin , pt will have to request this from Podiatrist.

## 2023-09-22 NOTE — Telephone Encounter (Signed)
 Patient states is in severe pain has been out of his gabapentin  for 5 days now needs refill. [facility]

## 2023-09-23 ENCOUNTER — Encounter (INDEPENDENT_AMBULATORY_CARE_PROVIDER_SITE_OTHER): Admitting: Ophthalmology

## 2023-09-23 DIAGNOSIS — H35033 Hypertensive retinopathy, bilateral: Secondary | ICD-10-CM

## 2023-09-23 DIAGNOSIS — H43813 Vitreous degeneration, bilateral: Secondary | ICD-10-CM

## 2023-09-23 DIAGNOSIS — E113313 Type 2 diabetes mellitus with moderate nonproliferative diabetic retinopathy with macular edema, bilateral: Secondary | ICD-10-CM | POA: Diagnosis not present

## 2023-09-23 DIAGNOSIS — Z7984 Long term (current) use of oral hypoglycemic drugs: Secondary | ICD-10-CM

## 2023-09-24 ENCOUNTER — Other Ambulatory Visit: Payer: Self-pay | Admitting: *Deleted

## 2023-09-24 DIAGNOSIS — I739 Peripheral vascular disease, unspecified: Secondary | ICD-10-CM

## 2023-09-24 DIAGNOSIS — I70221 Atherosclerosis of native arteries of extremities with rest pain, right leg: Secondary | ICD-10-CM

## 2023-10-02 ENCOUNTER — Other Ambulatory Visit: Payer: Self-pay | Admitting: Vascular Surgery

## 2023-10-02 DIAGNOSIS — I739 Peripheral vascular disease, unspecified: Secondary | ICD-10-CM

## 2023-10-06 ENCOUNTER — Ambulatory Visit: Admitting: Vascular Surgery

## 2023-10-09 ENCOUNTER — Ambulatory Visit: Payer: Medicare HMO | Admitting: Family Medicine

## 2023-10-09 ENCOUNTER — Encounter: Payer: Self-pay | Admitting: Family Medicine

## 2023-10-09 VITALS — BP 102/59 | HR 57 | Temp 97.6°F | Ht 70.0 in | Wt 218.4 lb

## 2023-10-09 DIAGNOSIS — I152 Hypertension secondary to endocrine disorders: Secondary | ICD-10-CM

## 2023-10-09 DIAGNOSIS — I70222 Atherosclerosis of native arteries of extremities with rest pain, left leg: Secondary | ICD-10-CM | POA: Diagnosis not present

## 2023-10-09 DIAGNOSIS — E1142 Type 2 diabetes mellitus with diabetic polyneuropathy: Secondary | ICD-10-CM

## 2023-10-09 DIAGNOSIS — I25119 Atherosclerotic heart disease of native coronary artery with unspecified angina pectoris: Secondary | ICD-10-CM | POA: Diagnosis not present

## 2023-10-09 DIAGNOSIS — R7989 Other specified abnormal findings of blood chemistry: Secondary | ICD-10-CM | POA: Diagnosis not present

## 2023-10-09 DIAGNOSIS — I739 Peripheral vascular disease, unspecified: Secondary | ICD-10-CM

## 2023-10-09 DIAGNOSIS — Z0001 Encounter for general adult medical examination with abnormal findings: Secondary | ICD-10-CM

## 2023-10-09 DIAGNOSIS — Z7984 Long term (current) use of oral hypoglycemic drugs: Secondary | ICD-10-CM

## 2023-10-09 DIAGNOSIS — E113553 Type 2 diabetes mellitus with stable proliferative diabetic retinopathy, bilateral: Secondary | ICD-10-CM

## 2023-10-09 DIAGNOSIS — E1169 Type 2 diabetes mellitus with other specified complication: Secondary | ICD-10-CM | POA: Diagnosis not present

## 2023-10-09 DIAGNOSIS — E785 Hyperlipidemia, unspecified: Secondary | ICD-10-CM

## 2023-10-09 DIAGNOSIS — E1159 Type 2 diabetes mellitus with other circulatory complications: Secondary | ICD-10-CM | POA: Diagnosis not present

## 2023-10-09 DIAGNOSIS — C61 Malignant neoplasm of prostate: Secondary | ICD-10-CM

## 2023-10-09 DIAGNOSIS — Z0000 Encounter for general adult medical examination without abnormal findings: Secondary | ICD-10-CM

## 2023-10-09 DIAGNOSIS — Z1329 Encounter for screening for other suspected endocrine disorder: Secondary | ICD-10-CM | POA: Diagnosis not present

## 2023-10-09 LAB — BAYER DCA HB A1C WAIVED: HB A1C (BAYER DCA - WAIVED): 6.6 % — ABNORMAL HIGH (ref 4.8–5.6)

## 2023-10-09 LAB — LIPID PANEL

## 2023-10-09 MED ORDER — METFORMIN HCL 500 MG PO TABS: 1500.0000 mg | ORAL_TABLET | Freq: Two times a day (BID) | ORAL | 3 refills | Status: AC

## 2023-10-09 NOTE — Progress Notes (Signed)
 Complete physical exam  Patient: [name]   DOB: 08-Apr-1950   73 y.o. Male  MRN: [id number]  Subjective:    Chief Complaint  Patient presents with   Annual Exam    [name] is a 73 y.o. male who presents today for a complete physical exam. He reports consuming a general diet. The patient does not participate in regular exercise at present. He generally feels well. He reports sleeping well. He does not have additional problems to discuss today.   [name] is a 73 year old male with peripheral artery disease and diabetes who presents for an annual physical exam.  He was recently seen by vascular surgery for peripheral artery disease and was prescribed Plavix , which he took on September 22, 2023. He experienced severe side effects including visual disturbances, gastrointestinal upset, and nausea, and stopped taking the medication after one dose. He has not informed the vascular surgeon about these side effects. No chest pain, shortness of breath, bowel movement irregularities, dental problems, or changes in hearing. He experiences dizziness only when taking the new blood thinner.  His diabetes management is reportedly going well, with most leg lesions healed except for one. He is currently taking metformin  for diabetes management.  He has a history of prostate cancer treated with surgery over twenty years ago and has not seen a urologist since. He reports no urinary symptoms such as increased frequency, urgency, or flow issues.  He is scheduled to see an eye doctor on August 20 or 22, 2025, for retinal injections and has had cataract surgery on one eye. He awaits further improvement before undergoing surgery on the other eye.  He is taking cholesterol medication without significant side effects, though he occasionally experiences cramps.  He has been out of gabapentin  for almost a month due to insurance issues after taking more than prescribed for leg pain. His vein  and foot doctors have attempted to refill the prescription, but it was denied by the pharmacy.  He reports good sleep despite working nearly sixty hours this week.      Most recent fall risk assessment:    10/09/2023    7:58 AM  Fall Risk   Falls in the past year? 0     Most recent depression screenings:    10/09/2023    7:58 AM 09/21/2023    2:33 PM  PHQ 2/9 Scores  PHQ - 2 Score 0 0  PHQ- 9 Score 0 0    Vision:Within last year and Dental: No current dental problems  Patient Active Problem List   Diagnosis Date Noted   Prostate cancer (HCC) 07/21/2023   Diabetic retinopathy (HCC) 04/17/2023   Critical limb ischemia of left lower extremity (HCC) 12/04/2022   Diabetic polyneuropathy associated with type 2 diabetes mellitus (HCC) 03/05/2022   Hyperlipidemia associated with type 2 diabetes mellitus (HCC) 03/05/2022   S/P amputation of lesser toe, left (HCC) 03/05/2022   Coronary artery disease involving native coronary artery of native heart with angina pectoris (HCC) 03/05/2022   Status post peripheral artery bypass 01/29/2022   PAD (peripheral artery disease) (HCC) 01/29/2022   Hypertension associated with type 2 diabetes mellitus (HCC) 12/06/2021   Alcohol abuse 12/18/2014   Bifascicular block 07/18/2014   OSA (obstructive sleep apnea) 05/21/2014   DM (diabetes mellitus) (HCC) 04/27/2012   Past Medical History:  Diagnosis Date   Allergy    seasonal   Arthritis     all over me-  back, hips, knees, hands, everywhere    Cancer [facility])    Prostate cancer age 40; Lupron treatments followed by prostatectomy.   Cataract    left removed, forming right   Chronic constipation    x 23 yrs after prostate surgery   Diabetes mellitus without complication (HCC)    Diabetic retinal damage of both eyes (HCC)    gets shots every month   GERD (gastroesophageal reflux disease)    Hyperlipidemia    Hypertension    Myocardial infarction [facility])    age 82; no stenting.   OSA  (obstructive sleep apnea)    non-compliant with CPAP.   Sleep apnea    no cpap   Past Surgical History:  Procedure Laterality Date   ABDOMINAL AORTOGRAM W/LOWER EXTREMITY N/A 01/17/2022   Procedure: ABDOMINAL AORTOGRAM W/LOWER EXTREMITY;  Surgeon: Eliza Lonni RAMAN, MD;  Location: [facility] INVASIVE CV LAB;  Service: Cardiovascular;  Laterality: N/A;   ABDOMINAL AORTOGRAM W/LOWER EXTREMITY Bilateral 12/04/2022   Procedure: ABDOMINAL AORTOGRAM W/LOWER EXTREMITY;  Surgeon: Gretta Lonni PARAS, MD;  Location: [facility] INVASIVE CV LAB;  Service: Cardiovascular;  Laterality: Bilateral;  Lysis infuaion via lt Bypass graft   AMPUTATION TOE Left 02/04/2022   Procedure: AMPUTATION  SECOND TOE;  Surgeon: Gershon Donnice SAUNDERS, DPM;  Location: [facility] OR;  Service: Podiatry;  Laterality: Left;   APPENDECTOMY     CHOLECYSTECTOMY     FEMORAL-TIBIAL BYPASS GRAFT Left 01/29/2022   Procedure: LEFT COMMON FEMORAL BYPASS TO ANTERIOR TIBIAL ARTERY;  Surgeon: Gretta Lonni PARAS, MD;  Location: [facility] OR;  Service: Vascular;  Laterality: Left;   PERIPHERAL VASCULAR INTERVENTION  12/05/2022   Procedure: PERIPHERAL VASCULAR INTERVENTION;  Surgeon: Magda Debby SAILOR, MD;  Location: [facility] INVASIVE CV LAB;  Service: Cardiovascular;;   PERIPHERAL VASCULAR THROMBECTOMY N/A 12/05/2022   Procedure: LYSIS RECHECK;  Surgeon: Magda Debby SAILOR, MD;  Location: [facility] INVASIVE CV LAB;  Service: Cardiovascular;  Laterality: N/A;   PROSTATE SURGERY     VEIN HARVEST Left 01/29/2022   Procedure: HARVEST OF LEFT LEG GREAT SAPHENOUS VEIN;  Surgeon: Gretta Lonni PARAS, MD;  Location: [facility] OR;  Service: Vascular;  Laterality: Left;   Social History   Tobacco Use   Smoking status: Never    Passive exposure: Never   Smokeless tobacco: Former    Types: Engineer, drilling   Vaping status: Never Used  Substance Use Topics   Alcohol use: Yes    Comment: social   Drug use: Not Currently   Social History   Socioeconomic History   Marital status: Single     Spouse name: GF-Betty   Number of children: 2   Years of education: 11   Highest education level: Not on file  Occupational History   Occupation: Event organiser: [employer]   Tobacco Use   Smoking status: Never    Passive exposure: Never   Smokeless tobacco: Former    Types: Engineer, drilling   Vaping status: Never Used  Substance and Sexual Activity   Alcohol use: Yes    Comment: social   Drug use: Not Currently   Sexual activity: Not Currently  Other Topics Concern   Not on file  Social History Narrative   Marital status: divorced, girlfriend x 1977     Children: 1      Tobacco: none; chew tobacco      Alcohol: beers on weekends      Exercise: none   Social Drivers of Health  Financial Resource Strain: Low Risk  (12/23/2022)   Overall Financial Resource Strain (CARDIA)    Difficulty of Paying Living Expenses: Not hard at all  Food Insecurity: No Food Insecurity (12/16/2022)   Hunger Vital Sign    Worried About Running Out of Food in the Last Year: Never true    Ran Out of Food in the Last Year: Never true  Transportation Needs: No Transportation Needs (12/16/2022)   PRAPARE - Administrator, Civil Service (Medical): No    Lack of Transportation (Non-Medical): No  Physical Activity: Insufficiently Active (12/23/2022)   Exercise Vital Sign    Days of Exercise per Week: 4 days    Minutes of Exercise per Session: 10 min  Stress: No Stress Concern Present (12/23/2022)   [employer] of Occupational Health - Occupational Stress Questionnaire    Feeling of Stress : Not at all  Social Connections: Socially Isolated (12/16/2022)   Social Connection and Isolation Panel    Frequency of Communication with Friends and Family: More than three times a week    Frequency of Social Gatherings with Friends and Family: More than three times a week    Attends Religious Services: Never    Database administrator or Organizations: No    Attends Tax inspector Meetings: Never    Marital Status: Divorced  Catering manager Violence: Not At Risk (12/16/2022)   Humiliation, Afraid, Rape, and Kick questionnaire    Fear of Current or Ex-Partner: No    Emotionally Abused: No    Physically Abused: No    Sexually Abused: No   Family Status  Relation Name Status   Mother  Deceased at age 12       leukemia   Father  Deceased at age 31       lung cancer with mets   Sister  Deceased at age 29       GSW   Sister  Alive   Sister  Alive   Brother  Deceased at age 25       AMI   MGM  Deceased   MGF  Deceased   PGM  Deceased   PGF  Deceased   Neg Hx  (Not Specified)  No partnership data on file   Family History  Problem Relation Age of Onset   Cancer Mother        leukemia   Diabetes Mother    Cancer Father 32       lung cancer with mets   Colon cancer Maternal Grandmother    Heart disease Maternal Grandmother    Heart disease Maternal Grandfather    Heart disease Paternal Grandmother    Cancer Paternal Grandfather        lung, prostate   Esophageal cancer Neg Hx    Colon polyps Neg Hx    Stomach cancer Neg Hx    Rectal cancer Neg Hx    Allergies  Allergen Reactions   Cymbalta  [Duloxetine  Hcl] Nausea And Vomiting   Darvocet [Propoxyphene N-Acetaminophen ] Hives      Patient Care Team: RakesRock HERO, FNP as PCP - General (Family Medicine) Karis Clunes, MD as Consulting Physician (Otolaryngology) [facility] Alvia Norleen BIRCH, MD as Consulting Physician (Ophthalmology) Jegede, Olugbemiga E, MD as Consulting Physician (Internal Medicine) Moises Reusing, RN as Case Manager Leahmarie Gasiorowski, Rock HERO, FNP (Family Medicine) Severa Rock HERO, FNP as Attending Physician (Family Medicine)   Outpatient Medications Prior to Visit  Medication Sig Note  aspirin  81 MG tablet Take 81 mg by mouth daily.    BAYER CONTOUR NEXT TEST test strip CHECK BLOOD SUGAR TWICE DAILY    BAYER MICROLET LANCETS lancets Check blood sugar  two times daily.    Blood Glucose Monitoring Suppl (BAYER CONTOUR NEXT MONITOR) w/Device KIT Check blood sugar two times daily.    ciprofloxacin  (CILOXAN ) 0.3 % ophthalmic solution Place 1 drop into both eyes See admin instructions. NSTILL ONE DROP INTO BOTH EYES 4 TIMES A DAY FOR TWO DAYS AFTER EACH MONTHLY EYE INJECTION    fluticasone  (FLONASE ) 50 MCG/ACT nasal spray Place 1 spray into both nostrils daily as needed for allergies or rhinitis.    gabapentin  (NEURONTIN ) 300 MG capsule TAKE 3 CAPSULES BY MOUTH 2 TIMES DAILY    lisinopril  (ZESTRIL ) 5 MG tablet Take 1 tablet (5 mg total) by mouth daily.    pantoprazole  (PROTONIX ) 40 MG tablet TAKE 1 TABLET (40 MG TOTAL) BY MOUTH TWICE A DAY BEFORE MEALS    rosuvastatin  (CRESTOR ) 20 MG tablet Take 1 tablet (20 mg total) by mouth daily.    [DISCONTINUED] metFORMIN  (GLUCOPHAGE ) 500 MG tablet Take 3 tablets (1,500 mg total) by mouth 2 (two) times daily with a meal.    [DISCONTINUED] apixaban  (ELIQUIS ) 5 MG TABS tablet Take 1 tablet (5 mg total) by mouth 2 (two) times daily. (Patient not taking: Reported on 10/09/2023)    [DISCONTINUED] clopidogrel  (PLAVIX ) 75 MG tablet Take 1 tablet (75 mg total) by mouth daily. 10/09/2023: N/V, visual disturbances   [DISCONTINUED] mupirocin  ointment (BACTROBAN ) 2 % Apply 1 Application topically 2 (two) times daily.    No facility-administered medications prior to visit.    ROS per HPI     Objective:     BP (!) 102/59   Pulse (!) 57   Temp 97.6 F (36.4 C) (Temporal)   Ht 5' 10 (1.778 m)   Wt 218 lb 6.4 oz (99.1 kg)   SpO2 98%   BMI 31.34 kg/m  BP Readings from Last 3 Encounters:  10/09/23 (!) 102/59  09/22/23 120/65  09/21/23 125/63   Wt Readings from Last 3 Encounters:  10/09/23 218 lb 6.4 oz (99.1 kg)  09/22/23 230 lb (104.3 kg)  09/21/23 224 lb (101.6 kg)   SpO2 Readings from Last 3 Encounters:  10/09/23 98%  09/21/23 95%  09/10/23 98%      Physical Exam Vitals and nursing note reviewed.   Constitutional:      General: He is not in acute distress.    Appearance: Normal appearance. He is well-developed and well-groomed. He is obese. He is not ill-appearing, toxic-appearing or diaphoretic.  HENT:     Head: Normocephalic and atraumatic.     Jaw: There is normal jaw occlusion.     Right Ear: Hearing, tympanic membrane, ear canal and external ear normal.     Left Ear: Hearing, tympanic membrane, ear canal and external ear normal.     Nose: Nose normal.     Mouth/Throat:     Lips: Pink.     Mouth: Mucous membranes are moist.     Pharynx: Oropharynx is clear. Uvula midline.  Eyes:     General: Lids are normal.     Extraocular Movements: Extraocular movements intact.     Conjunctiva/sclera: Conjunctivae normal.     Pupils: Pupils are equal, round, and reactive to light.  Neck:     Thyroid : No thyroid  mass, thyromegaly or thyroid  tenderness.     Vascular: No carotid bruit or  JVD.     Trachea: Trachea and phonation normal.  Cardiovascular:     Rate and Rhythm: Normal rate and regular rhythm.     Chest Wall: PMI is not displaced.     Pulses:          Dorsalis pedis pulses are 2+ on the right side and 2+ on the left side.       Posterior tibial pulses are 2+ on the right side and 2+ on the left side.     Heart sounds: Normal heart sounds. No murmur heard.    No friction rub. No gallop.  Pulmonary:     Effort: Pulmonary effort is normal. No respiratory distress.     Breath sounds: Normal breath sounds. No wheezing.  Chest:     Chest wall: No mass.  Breasts:    Breasts are symmetrical.  Abdominal:     General: Abdomen is flat. Bowel sounds are normal. There is no distension or abdominal bruit.     Palpations: Abdomen is soft. There is no hepatomegaly or splenomegaly.     Tenderness: There is no abdominal tenderness. There is no right CVA tenderness or left CVA tenderness.     Hernia: No hernia is present.  Musculoskeletal:        General: Normal range of motion.      Cervical back: Full passive range of motion without pain, normal range of motion and neck supple.     Right lower leg: No edema.     Left lower leg: No edema.  Feet:     Right foot:     Skin integrity: Skin integrity normal.     Toenail Condition: Right toenails are normal.     Left foot:     Skin integrity: Skin integrity normal.     Toenail Condition: Left toenails are normal.  Lymphadenopathy:     Cervical: No cervical adenopathy.     Upper Body:     Right upper body: No supraclavicular, axillary or pectoral adenopathy.     Left upper body: No supraclavicular, axillary or pectoral adenopathy.  Skin:    General: Skin is warm and dry.     Capillary Refill: Capillary refill takes less than 2 seconds.     Coloration: Skin is not cyanotic, jaundiced or pale.     Findings: No rash.     Comments: Wounds to lower legs have healed.   Neurological:     General: No focal deficit present.     Mental Status: He is alert and oriented to person, place, and time.     Sensory: Sensation is intact.     Motor: Motor function is intact.     Coordination: Coordination is intact.     Gait: Gait abnormal (slow).     Deep Tendon Reflexes: Reflexes are normal and symmetric.  Psychiatric:        Attention and Perception: Attention and perception normal.        Mood and Affect: Mood and affect normal.        Speech: Speech normal.        Behavior: Behavior normal. Behavior is cooperative.        Thought Content: Thought content normal.        Cognition and Memory: Cognition and memory normal.        Judgment: Judgment normal.       Last CBC Lab Results  Component Value Date   WBC 7.8 01/07/2023   HGB 12.8 (L) 01/07/2023  HCT 40.2 01/07/2023   MCV 89 01/07/2023   MCH 28.4 01/07/2023   RDW 13.3 01/07/2023   PLT 162 01/07/2023   Last metabolic panel Lab Results  Component Value Date   GLUCOSE 78 01/07/2023   NA 144 01/07/2023   K 3.7 01/07/2023   CL 105 01/07/2023   CO2 24 01/07/2023    BUN 14 01/07/2023   CREATININE 0.99 01/07/2023   EGFR 82 01/07/2023   CALCIUM  9.7 01/07/2023   PHOS 3.0 05/21/2014   PROT 6.4 01/07/2023   ALBUMIN 4.3 01/07/2023   LABGLOB 2.1 01/07/2023   AGRATIO 1.9 03/05/2022   BILITOT 0.9 01/07/2023   ALKPHOS 89 01/07/2023   AST 20 01/07/2023   ALT 15 01/07/2023   ANIONGAP 9 12/05/2022   Last lipids Lab Results  Component Value Date   CHOL 93 (L) 01/07/2023   HDL 43 01/07/2023   LDLCALC 29 01/07/2023   LDLDIRECT 112.0 08/03/2018   TRIG 116 01/07/2023   CHOLHDL 2.2 01/07/2023   Last hemoglobin A1c Lab Results  Component Value Date   HGBA1C 7.3 (H) 07/21/2023   Last thyroid  functions Lab Results  Component Value Date   TSH 3.670 07/21/2023   T4TOTAL 8.0 07/21/2023   Last vitamin B12 and Folate Lab Results  Component Value Date   VITAMINB12 287 10/07/2022   FOLATE 19.2 05/21/2014        Assessment & Plan:    Routine Health Maintenance and Physical Exam  Immunization History  Administered Date(s) Administered   Fluad Trivalent(High Dose 65+) 01/07/2023   Influenza Split 11/20/2020   Influenza, High Dose Seasonal PF 11/24/2019   Influenza,inj,Quad PF,6+ Mos 11/04/2016, 12/27/2017, 11/09/2018   Influenza-Unspecified 11/16/2015, 12/27/2017, 12/27/2017, 12/12/2020, 12/04/2021   Moderna Sars-Covid-2 Vaccination 05/13/2019, 06/15/2019, 02/23/2020, 12/20/2020   PNEUMOCOCCAL CONJUGATE-20 10/07/2022   Pneumococcal Polysaccharide-23 08/04/2015, 11/09/2018   Td 03/07/1995   Td (Adult),5 Lf Tetanus Toxid, Preservative Free 03/07/1995   Tdap 09/26/2012, 07/14/2017, 07/29/2023   Varicella 12/04/2021   Zoster Recombinant(Shingrix) 12/04/2021    Health Maintenance  Topic Date Due   Diabetic kidney evaluation - Urine ACR  03/06/2023   Medicare Annual Wellness (AWV)  12/01/2023   Zoster Vaccines- Shingrix (2 of 2) 10/21/2023 (Originally 01/29/2022)   INFLUENZA VACCINE  05/31/2024 (Originally 10/02/2023)   COVID-19 Vaccine (5 -  2024-25 season) 10/24/2024 (Originally 11/02/2022)   OPHTHALMOLOGY EXAM  12/26/2023   Diabetic kidney evaluation - eGFR measurement  01/07/2024   FOOT EXAM  01/07/2024   HEMOGLOBIN A1C  01/21/2024   Colonoscopy  07/24/2024   DTaP/Tdap/Td (6 - Td or Tdap) 07/28/2033   Pneumococcal Vaccine: 50+ Years  Completed   Hepatitis C Screening  Completed   Hepatitis B Vaccines  Aged Out   HPV VACCINES  Aged Out   Meningococcal B Vaccine  Aged Out    Discussed health benefits of physical activity, and encouraged him to engage in regular exercise appropriate for his age and condition.  Problem List Items Addressed This Visit       Cardiovascular and Mediastinum   Hypertension associated with type 2 diabetes mellitus (HCC)   Relevant Medications   metFORMIN  (GLUCOPHAGE ) 500 MG tablet   Other Relevant Orders   Bayer DCA Hb A1c Waived   Microalbumin / creatinine urine ratio   CBC with Differential/Platelet   CMP14+EGFR   Lipid panel   Thyroid  Panel With TSH   PAD (peripheral artery disease) (HCC)   Relevant Orders   Bayer DCA Hb A1c Waived   CMP14+EGFR  Lipid panel   Coronary artery disease involving native coronary artery of native heart with angina pectoris (HCC)   Relevant Orders   Bayer DCA Hb A1c Waived   CBC with Differential/Platelet   CMP14+EGFR   Lipid panel   Critical limb ischemia of left lower extremity (HCC)   Relevant Orders   Bayer DCA Hb A1c Waived     Endocrine   DM (diabetes mellitus) (HCC) - Primary   Relevant Medications   metFORMIN  (GLUCOPHAGE ) 500 MG tablet   Other Relevant Orders   Bayer DCA Hb A1c Waived   Microalbumin / creatinine urine ratio   Vitamin B12   CBC with Differential/Platelet   CMP14+EGFR   Lipid panel   Thyroid  Panel With TSH   Diabetic polyneuropathy associated with type 2 diabetes mellitus (HCC)   Relevant Medications   metFORMIN  (GLUCOPHAGE ) 500 MG tablet   Other Relevant Orders   Bayer DCA Hb A1c Waived   Vitamin B12   CBC with  Differential/Platelet   CMP14+EGFR   Hyperlipidemia associated with type 2 diabetes mellitus (HCC)   Relevant Medications   metFORMIN  (GLUCOPHAGE ) 500 MG tablet   Other Relevant Orders   Bayer DCA Hb A1c Waived   CMP14+EGFR   Lipid panel   Diabetic retinopathy (HCC)   Relevant Medications   metFORMIN  (GLUCOPHAGE ) 500 MG tablet   Other Relevant Orders   Bayer DCA Hb A1c Waived     Genitourinary   Prostate cancer (HCC)   Relevant Orders   PSA, total and free   Other Visit Diagnoses       Elevated TSH       Relevant Orders   Thyroid  Panel With TSH     Annual physical exam       Relevant Orders   CBC with Differential/Platelet   CMP14+EGFR   Lipid panel         Peripheral artery disease Recent switch to Plavix  resulted in significant side effects including visual disturbances, gastrointestinal upset, and diarrhea. He stopped taking Plavix  after one dose due to these side effects. - Contact vascular surgery to report side effects of Plavix   Type 2 diabetes mellitus with diabetic neuropathy Type 2 diabetes mellitus is well-controlled with metformin . Diabetic neuropathy managed with gabapentin , but he has been out of medication for almost a month due to insurance denial after increased usage for leg pain. - Contact pharmacy to resolve insurance denial for gabapentin  - Ensure gabapentin  refill is processed by the 14th of the month  Retinal disease requiring intravitreal injections Managed with intravitreal injections. Scheduled for eye doctor visit on the 20th or 22nd of this month for injections. - Attend scheduled eye doctor appointment for intravitreal injections  Prostate cancer PSA monitored annually. No urinary symptoms reported. - Continue annual PSA monitoring  Hyperlipidemia Managed with cholesterol medication. No significant muscle aches reported, occasional cramps possibly due to inadequate hydration. - Continue cholesterol medication as prescribed - Encourage  increased water  intake  Adult Wellness Visit Routine adult wellness visit. Blood pressure is well-controlled. No chest pain, shortness of breath, or dental problems. Sleeping well. No dizziness except when taking Plavix . No changes in hearing or vision except when taking Plavix . - Encourage adequate hydration - Discussed potential side effects of Plavix  (visual disturbances, gastrointestinal upset)       Return in about 3 months (around 01/09/2024) for chronic follow up.     Rosaline Bruns, FNP

## 2023-10-10 LAB — CMP14+EGFR
ALT: 15 IU/L (ref 0–44)
AST: 27 IU/L (ref 0–40)
Albumin: 4.5 g/dL (ref 3.9–4.9)
Alkaline Phosphatase: 87 IU/L (ref 44–121)
BUN/Creatinine Ratio: 10 (ref 10–24)
BUN: 12 mg/dL (ref 8–27)
Bilirubin Total: 1.1 mg/dL (ref 0.0–1.2)
CO2: 19 mmol/L — ABNORMAL LOW (ref 20–29)
Calcium: 9.2 mg/dL (ref 8.6–10.2)
Chloride: 102 mmol/L (ref 96–106)
Creatinine, Ser: 1.16 mg/dL (ref 0.76–1.27)
Globulin, Total: 2.3 g/dL (ref 1.5–4.5)
Glucose: 122 mg/dL — ABNORMAL HIGH (ref 70–99)
Potassium: 4.3 mmol/L (ref 3.5–5.2)
Sodium: 139 mmol/L (ref 134–144)
Total Protein: 6.8 g/dL (ref 6.0–8.5)
eGFR: 68 mL/min/1.73 (ref >59)

## 2023-10-10 LAB — LIPID PANEL
Chol/HDL Ratio: 4.4 ratio (ref 0.0–5.0)
Cholesterol, Total: 212 mg/dL — ABNORMAL HIGH (ref 100–199)
HDL: 48 mg/dL (ref >39)
LDL Chol Calc (NIH): 146 mg/dL — ABNORMAL HIGH (ref 0–99)
Triglycerides: 100 mg/dL (ref 0–149)
VLDL Cholesterol Cal: 18 mg/dL (ref 5–40)

## 2023-10-10 LAB — CBC WITH DIFFERENTIAL/PLATELET
Basophils Absolute: 0.1 x10E3/uL (ref 0.0–0.2)
Basos: 1 %
EOS (ABSOLUTE): 0.3 x10E3/uL (ref 0.0–0.4)
Eos: 4 %
Hematocrit: 42.7 % (ref 37.5–51.0)
Hemoglobin: 13.5 g/dL (ref 13.0–17.7)
Immature Grans (Abs): 0 x10E3/uL (ref 0.0–0.1)
Immature Granulocytes: 0 %
Lymphocytes Absolute: 2.7 x10E3/uL (ref 0.7–3.1)
Lymphs: 38 %
MCH: 27.5 pg (ref 26.6–33.0)
MCHC: 31.6 g/dL (ref 31.5–35.7)
MCV: 87 fL (ref 79–97)
Monocytes Absolute: 0.6 x10E3/uL (ref 0.1–0.9)
Monocytes: 9 %
Neutrophils Absolute: 3.4 x10E3/uL (ref 1.4–7.0)
Neutrophils: 48 %
Platelets: 205 x10E3/uL (ref 150–450)
RBC: 4.91 x10E6/uL (ref 4.14–5.80)
RDW: 14.3 % (ref 11.6–15.4)
WBC: 7.1 x10E3/uL (ref 3.4–10.8)

## 2023-10-10 LAB — PSA, TOTAL AND FREE
PSA, Free: <0.02 ng/mL
Prostate Specific Ag, Serum: <0.1 ng/mL (ref 0.0–4.0)

## 2023-10-10 LAB — THYROID PANEL WITH TSH
Free Thyroxine Index: 2.1 (ref 1.2–4.9)
T3 Uptake Ratio: 29 % (ref 24–39)
T4, Total: 7.3 ug/dL (ref 4.5–12.0)
TSH: 4.78 u[IU]/mL — ABNORMAL HIGH (ref 0.450–4.500)

## 2023-10-10 LAB — VITAMIN B12: Vitamin B-12: 323 pg/mL (ref 232–1245)

## 2023-10-11 ENCOUNTER — Ambulatory Visit: Payer: Self-pay | Admitting: Family Medicine

## 2023-10-11 LAB — MICROALBUMIN / CREATININE URINE RATIO
Creatinine, Urine: 309.8 mg/dL
Microalb/Creat Ratio: 14 mg/g{creat} (ref 0–29)
Microalbumin, Urine: 43.8 ug/mL

## 2023-10-12 NOTE — Telephone Encounter (Signed)
CALLED PATIENT, NO ANSWER, LEFT MESSAGE TO RETURN CALL 

## 2023-10-21 ENCOUNTER — Ambulatory Visit: Admitting: Family Medicine

## 2023-10-21 ENCOUNTER — Encounter (INDEPENDENT_AMBULATORY_CARE_PROVIDER_SITE_OTHER): Admitting: Ophthalmology

## 2023-10-21 DIAGNOSIS — H43813 Vitreous degeneration, bilateral: Secondary | ICD-10-CM

## 2023-10-21 DIAGNOSIS — H35033 Hypertensive retinopathy, bilateral: Secondary | ICD-10-CM | POA: Diagnosis not present

## 2023-10-21 DIAGNOSIS — Z7984 Long term (current) use of oral hypoglycemic drugs: Secondary | ICD-10-CM

## 2023-10-21 DIAGNOSIS — I10 Essential (primary) hypertension: Secondary | ICD-10-CM | POA: Diagnosis not present

## 2023-10-21 DIAGNOSIS — E113313 Type 2 diabetes mellitus with moderate nonproliferative diabetic retinopathy with macular edema, bilateral: Secondary | ICD-10-CM

## 2023-11-02 NOTE — Progress Notes (Unsigned)
 Patient name: [name] MRN: [id number] DOB: September 14, 1950 Sex: male  REASON FOR VISIT: Follow-up  HPI: [name] is a 73 y.o. male with multiple comorbidities that presents for hospital follow-up.  Patient previously underwent a left common femoral to anterior tibial bypass with ipsilateral nonreversed great saphenous vein on 01/29/22 for CLI with tissue loss.  Ultimately his wounds healed.  Most recently was undergoing angiogram on 12/04/2022 to evaluate his right leg PAD and was found to have an occluded left leg bypass.  We then stuck the contralateral groin and performed left leg lytics.  He underwent left DP AT angioplasty on the lytics takeback on 12/05/2022.  Duplex on 01/01/2023 after recent intervention shows patent left leg bypass with no stenosis.  Remains on aspirin  and Eliquis .  States he is getting treatment for his neuropathy in his legs.  09/22/23: Patient returns to clinic for surveillance of lower extremity symptoms.  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  He reports aching pain in the legs with activity.  He is able to work, but is difficult for him.  He did suffer traumatic injury while building the deck's, and has healed small lacerations in his legs.  He reports significant discomfort in his feet.  He is description is not typical of ischemic rest pain.  He reports pain begins when he sits down to rest after working and improves with walking.  He describes the pain is mostly in his ankles and hindfoot.  He does report fairly severe claudication symptoms, but is able to continue working with them.  He has not taken Eliquis  because he cannot afford it.  Past Medical History:  Diagnosis Date   Allergy    seasonal   Arthritis     all over me- back, hips, knees, hands, everywhere    Cancer [facility])    Prostate cancer age 71; Lupron treatments followed by prostatectomy.   Cataract    left removed, forming right   Chronic  constipation    x 23 yrs after prostate surgery   Diabetes mellitus without complication (HCC)    Diabetic retinal damage of both eyes (HCC)    gets shots every month   GERD (gastroesophageal reflux disease)    Hyperlipidemia    Hypertension    Myocardial infarction [facility])    age 38; no stenting.   OSA (obstructive sleep apnea)    non-compliant with CPAP.   Sleep apnea    no cpap    Past Surgical History:  Procedure Laterality Date   ABDOMINAL AORTOGRAM W/LOWER EXTREMITY N/A 01/17/2022   Procedure: ABDOMINAL AORTOGRAM W/LOWER EXTREMITY;  Surgeon: Eliza Lonni RAMAN, MD;  [facility] INVASIVE CV LAB;  Service: Cardiovascular;  Laterality: N/A;   ABDOMINAL AORTOGRAM W/LOWER EXTREMITY Bilateral 12/04/2022   Procedure: ABDOMINAL AORTOGRAM W/LOWER EXTREMITY;  Surgeon: Gretta Lonni PARAS, MD;  Location: [facility] INVASIVE CV LAB;  Service: Cardiovascular;  Laterality: Bilateral;  Lysis infuaion via lt Bypass graft   AMPUTATION TOE Left 02/04/2022   Procedure: AMPUTATION  SECOND TOE;  Surgeon: Gershon Donnice JONELLE, DPM;  Location: [facility] OR;  Service: Podiatry;  Laterality: Left;   APPENDECTOMY     CHOLECYSTECTOMY     FEMORAL-TIBIAL BYPASS GRAFT Left 01/29/2022   Procedure: LEFT COMMON FEMORAL BYPASS TO ANTERIOR TIBIAL ARTERY;  Surgeon: Gretta Lonni PARAS, MD;  Location: [facility] OR;  Service: Vascular;  Laterality: Left;   PERIPHERAL VASCULAR INTERVENTION  12/05/2022   Procedure: PERIPHERAL VASCULAR  INTERVENTION;  Surgeon: Magda Debby SAILOR, MD;  Location: [facility] INVASIVE CV LAB;  Service: Cardiovascular;;   PERIPHERAL VASCULAR THROMBECTOMY N/A 12/05/2022   Procedure: LYSIS RECHECK;  Surgeon: Magda Debby SAILOR, MD;  Location: [facility] INVASIVE CV LAB;  Service: Cardiovascular;  Laterality: N/A;   PROSTATE SURGERY     VEIN HARVEST Left 01/29/2022   Procedure: HARVEST OF LEFT LEG GREAT SAPHENOUS VEIN;  Surgeon: Gretta Lonni PARAS, MD;  Location: [facility] OR;  Service: Vascular;  Laterality: Left;    Family History   Problem Relation Age of Onset   Cancer Mother        leukemia   Diabetes Mother    Cancer Father 84       lung cancer with mets   Colon cancer Maternal Grandmother    Heart disease Maternal Grandmother    Heart disease Maternal Grandfather    Heart disease Paternal Grandmother    Cancer Paternal Grandfather        lung, prostate   Esophageal cancer Neg Hx    Colon polyps Neg Hx    Stomach cancer Neg Hx    Rectal cancer Neg Hx     SOCIAL HISTORY: Social History   Tobacco Use   Smoking status: Never    Passive exposure: Never   Smokeless tobacco: Former    Types: Chew  Substance Use Topics   Alcohol use: Yes    Comment: social    Allergies  Allergen Reactions   Cymbalta  [Duloxetine  Hcl] Nausea And Vomiting   Darvocet [Propoxyphene N-Acetaminophen ] Hives    Current Outpatient Medications  Medication Sig Dispense Refill   aspirin  81 MG tablet Take 81 mg by mouth daily.     BAYER CONTOUR NEXT TEST test strip CHECK BLOOD SUGAR TWICE DAILY 100 each 5   BAYER MICROLET LANCETS lancets Check blood sugar two times daily. 100 each 5   Blood Glucose Monitoring Suppl (BAYER CONTOUR NEXT MONITOR) w/Device KIT Check blood sugar two times daily. 1 kit 2   ciprofloxacin  (CILOXAN ) 0.3 % ophthalmic solution Place 1 drop into both eyes See admin instructions. NSTILL ONE DROP INTO BOTH EYES 4 TIMES A DAY FOR TWO DAYS AFTER EACH MONTHLY EYE INJECTION     fluticasone  (FLONASE ) 50 MCG/ACT nasal spray Place 1 spray into both nostrils daily as needed for allergies or rhinitis.     gabapentin  (NEURONTIN ) 300 MG capsule TAKE 3 CAPSULES BY MOUTH 2 TIMES DAILY 540 capsule 0   lisinopril  (ZESTRIL ) 5 MG tablet Take 1 tablet (5 mg total) by mouth daily. 90 tablet 3   metFORMIN  (GLUCOPHAGE ) 500 MG tablet Take 3 tablets (1,500 mg total) by mouth 2 (two) times daily with a meal. 540 tablet 3   pantoprazole  (PROTONIX ) 40 MG tablet TAKE 1 TABLET (40 MG TOTAL) BY MOUTH TWICE A DAY BEFORE MEALS 180 tablet 0    rosuvastatin  (CRESTOR ) 20 MG tablet Take 1 tablet (20 mg total) by mouth daily. 30 tablet 11   No current facility-administered medications for this visit.    PHYSICAL EXAM: There were no vitals filed for this visit.  Elderly man in no distress Regular rate and rhythm Unlabored breathing No palpable pedal pulses Traumatic wounds of the left and right lower extremity have spontaneously healed Left second toe surgically absent  DATA:  +-------+-----------+-----------+------------+------------+  ABI/TBIToday's ABIToday's TBIPrevious ABIPrevious TBI  +-------+-----------+-----------+------------+------------+  Right [place]                    [place]  0.36          +-------+-----------+-----------+------------+------------+  Left  0.57                  1.09        0.82          +-------+-----------+-----------+------------+------------+    Assessment/Plan: 73 y.o. male status post left common femoral to anterior tibial artery bypass with greater saphenous vein 01/29/2022.  He has atypical symptoms of peripheral arterial disease.  Ankle-brachial index in the left has deteriorated since last measurement, but his duplex shows a widely patent bypass graft without stenosis.  I will plan to see him back in short interval follow-up to evaluate his symptoms.  Recommend:  Abstinence from all tobacco products. Blood glucose control with goal A1c < 7%. Blood pressure control with goal blood pressure < 130/80 mmHg. Lipid reduction therapy with goal LDL-C < 55 mg/dL. Aspirin  81mg  by mouth daily. Add clopidogrel  75 mg by mouth daily Atorvastatin  40-80mg  PO QD (or other high intensity statin therapy). Daily walking to and past the point of discomfort.   Debby SAILOR. Magda, MD [facility] Phone Number: [phone] 11/02/2023 5:20 PM

## 2023-11-03 ENCOUNTER — Ambulatory Visit (HOSPITAL_COMMUNITY)
Admission: RE | Admit: 2023-11-03 | Discharge: 2023-11-03 | Disposition: A | Discharge status: Home / Self Care | Source: Ambulatory Visit | Attending: Vascular Surgery | Admitting: Vascular Surgery

## 2023-11-03 ENCOUNTER — Encounter: Payer: Self-pay | Admitting: Vascular Surgery

## 2023-11-03 ENCOUNTER — Ambulatory Visit: Attending: Vascular Surgery | Admitting: Vascular Surgery

## 2023-11-03 ENCOUNTER — Ambulatory Visit: Admitting: Vascular Surgery

## 2023-11-03 VITALS — BP 115/75 | HR 64 | Temp 97.9°F | Ht 70.0 in | Wt 224.0 lb

## 2023-11-03 DIAGNOSIS — I739 Peripheral vascular disease, unspecified: Secondary | ICD-10-CM | POA: Diagnosis not present

## 2023-11-03 LAB — VAS US ABI WITH/WO TBI: Left ABI: 0.84

## 2023-11-18 ENCOUNTER — Encounter (INDEPENDENT_AMBULATORY_CARE_PROVIDER_SITE_OTHER): Admitting: Ophthalmology

## 2023-11-23 ENCOUNTER — Encounter (INDEPENDENT_AMBULATORY_CARE_PROVIDER_SITE_OTHER): Admitting: Ophthalmology

## 2023-11-23 DIAGNOSIS — I10 Essential (primary) hypertension: Secondary | ICD-10-CM

## 2023-11-23 DIAGNOSIS — H35033 Hypertensive retinopathy, bilateral: Secondary | ICD-10-CM

## 2023-11-23 DIAGNOSIS — H43813 Vitreous degeneration, bilateral: Secondary | ICD-10-CM | POA: Diagnosis not present

## 2023-11-23 DIAGNOSIS — Z7984 Long term (current) use of oral hypoglycemic drugs: Secondary | ICD-10-CM

## 2023-11-23 DIAGNOSIS — E113313 Type 2 diabetes mellitus with moderate nonproliferative diabetic retinopathy with macular edema, bilateral: Secondary | ICD-10-CM

## 2023-12-02 ENCOUNTER — Ambulatory Visit: Payer: Medicare HMO

## 2023-12-02 VITALS — BP 115/75 | HR 64 | Ht 70.0 in | Wt 224.0 lb

## 2023-12-02 DIAGNOSIS — Z0000 Encounter for general adult medical examination without abnormal findings: Secondary | ICD-10-CM | POA: Diagnosis not present

## 2023-12-02 NOTE — Patient Instructions (Signed)
 [name],  Thank you for taking the time for your Medicare Wellness Visit. I appreciate your continued commitment to your health goals. Please review the care plan we discussed, and feel free to reach out if I can assist you further.  Medicare recommends these wellness visits once per year to help you and your care team stay ahead of potential health issues. These visits are designed to focus on prevention, allowing your provider to concentrate on managing your acute and chronic conditions during your regular appointments.  Please note that Annual Wellness Visits do not include a physical exam. Some assessments may be limited, especially if the visit was conducted virtually. If needed, we may recommend a separate in-person follow-up with your provider.  Ongoing Care Seeing your primary care provider every 3 to 6 months helps us  monitor your health and provide consistent, personalized care.   Referrals If a referral was made during today's visit and you haven't received any updates within two weeks, please contact the referred provider directly to check on the status.  Recommended Screenings:  Health Maintenance  Topic Date Due   Zoster (Shingles) Vaccine (2 of 2) 01/29/2022   COVID-19 Vaccine (5 - 2025-26 season) 11/02/2023   Medicare Annual Wellness Visit  12/01/2023   Flu Shot  05/31/2024*   Complete foot exam   01/07/2024   Hemoglobin A1C  04/10/2024   Colon Cancer Screening  07/24/2024   Eye exam for diabetics  09/22/2024   Yearly kidney function blood test for diabetes  10/08/2024   Yearly kidney health urinalysis for diabetes  10/08/2024   DTaP/Tdap/Td vaccine (6 - Td or Tdap) 07/28/2033   Pneumococcal Vaccine for age over 7  Completed   Hepatitis C Screening  Completed   HPV Vaccine  Aged Out   Meningitis B Vaccine  Aged Out  *Topic was postponed. The date shown is not the original due date.       12/02/2023    9:29 AM  Advanced Directives  Does Patient Have a Medical  Advance Directive? Yes  Type of Estate agent of [place];Living will  Copy of Healthcare Power of Attorney in Chart? No - copy requested   Advance Care Planning is important because it: Ensures you receive medical care that aligns with your values, goals, and preferences. Provides guidance to your family and loved ones, reducing the emotional burden of decision-making during critical moments.  Vision: Annual vision screenings are recommended for early detection of glaucoma, cataracts, and diabetic retinopathy. These exams can also reveal signs of chronic conditions such as diabetes and high blood pressure.  Dental: Annual dental screenings help detect early signs of oral cancer, gum disease, and other conditions linked to overall health, including heart disease and diabetes.  Please see the attached documents for additional preventive care recommendations.

## 2023-12-02 NOTE — Progress Notes (Signed)
 Subjective:   Clayton Lewter. is a 73 y.o. who presents for a Medicare Wellness preventive visit.  As a reminder, Annual Wellness Visits don't include a physical exam, and some assessments may be limited, especially if this visit is performed virtually. We may recommend an in-person follow-up visit with your provider if needed.  Visit Complete: Virtual I connected with  [name] on 12/02/23 by a audio enabled telemedicine application and verified that I am speaking with the correct person using two identifiers.  Patient Location: Home  Provider Location: Home Office  I discussed the limitations of evaluation and management by telemedicine. The patient expressed understanding and agreed to proceed.  Vital Signs: Because this visit was a virtual/telehealth visit, some criteria may be missing or patient reported. Any vitals not documented were not able to be obtained and vitals that have been documented are patient reported.  VideoDeclined- This patient declined Librarian, academic. Therefore the visit was completed with audio only.  Persons Participating in Visit: Patient.  AWV Questionnaire: No: Patient Medicare AWV questionnaire was not completed prior to this visit.  Cardiac Risk Factors include: advanced age (>32men, >4 women);diabetes mellitus;dyslipidemia;hypertension;male gender;obesity (BMI >30kg/m2);smoking/ tobacco exposure     Objective:    Today's Vitals   12/02/23 0921  BP: 115/75  Pulse: 64  Weight: 224 lb (101.6 kg)  Height: 5' 10 (1.778 m)   Body mass index is 32.14 kg/m.     12/02/2023    9:29 AM 12/04/2022   10:01 AM 12/01/2022    1:44 PM 02/06/2022    6:00 PM 01/17/2022    7:03 AM 11/25/2020    1:11 PM 05/06/2019    9:13 AM  Advanced Directives  Does Patient Have a Medical Advance Directive? Yes Yes Yes No Yes No Yes  Type of Estate agent of [place];Living will Living will Healthcare Power of  [place];Living will  Healthcare Power of [employer];Living will  Living will  Does patient want to make changes to medical advance directive?  No - Patient declined  Yes (Inpatient - patient requests chaplain consult to change a medical advance directive) No - Patient declined    Copy of Healthcare Power of Attorney in Chart? No - copy requested  No - copy requested      Would patient like information on creating a medical advance directive?    Yes (Inpatient - patient requests chaplain consult to create a medical advance directive)       Current Medications (verified) Outpatient Encounter Medications as of 12/02/2023  Medication Sig   aspirin  81 MG tablet Take 81 mg by mouth daily.   BAYER CONTOUR NEXT TEST test strip CHECK BLOOD SUGAR TWICE DAILY   BAYER MICROLET LANCETS lancets Check blood sugar two times daily.   Blood Glucose Monitoring Suppl (BAYER CONTOUR NEXT MONITOR) w/Device KIT Check blood sugar two times daily.   ciprofloxacin  (CILOXAN ) 0.3 % ophthalmic solution Place 1 drop into both eyes See admin instructions. NSTILL ONE DROP INTO BOTH EYES 4 TIMES A DAY FOR TWO DAYS AFTER EACH MONTHLY EYE INJECTION   fluticasone  (FLONASE ) 50 MCG/ACT nasal spray Place 1 spray into both nostrils daily as needed for allergies or rhinitis.   gabapentin  (NEURONTIN ) 300 MG capsule TAKE 3 CAPSULES BY MOUTH 2 TIMES DAILY   lisinopril  (ZESTRIL ) 5 MG tablet Take 1 tablet (5 mg total) by mouth daily.   metFORMIN  (GLUCOPHAGE ) 500 MG tablet Take 3 tablets (1,500 mg total) by mouth  2 (two) times daily with a meal.   pantoprazole  (PROTONIX ) 40 MG tablet TAKE 1 TABLET (40 MG TOTAL) BY MOUTH TWICE A DAY BEFORE MEALS   rosuvastatin  (CRESTOR ) 20 MG tablet Take 1 tablet (20 mg total) by mouth daily.   No facility-administered encounter medications on file as of 12/02/2023.    Allergies (verified) Cymbalta  [duloxetine  hcl] and Darvocet [propoxyphene n-acetaminophen ]   History: Past Medical History:  Diagnosis  Date   Allergy    seasonal   Arthritis     all over me- back, hips, knees, hands, everywhere    Cancer (HCC)    Prostate cancer age 65; Lupron treatments followed by prostatectomy.   Cataract    left removed, forming right   Chronic constipation    x 23 yrs after prostate surgery   Diabetes mellitus without complication (HCC)    Diabetic retinal damage of both eyes (HCC)    gets shots every month   GERD (gastroesophageal reflux disease)    Hyperlipidemia    Hypertension    Myocardial infarction [facility])    age 14; no stenting.   OSA (obstructive sleep apnea)    non-compliant with CPAP.   Sleep apnea    no cpap   Past Surgical History:  Procedure Laterality Date   ABDOMINAL AORTOGRAM W/LOWER EXTREMITY N/A 01/17/2022   Procedure: ABDOMINAL AORTOGRAM W/LOWER EXTREMITY;  Surgeon: Eliza Lonni RAMAN, MD;  Location: [facility] INVASIVE CV LAB;  Service: Cardiovascular;  Laterality: N/A;   ABDOMINAL AORTOGRAM W/LOWER EXTREMITY Bilateral 12/04/2022   Procedure: ABDOMINAL AORTOGRAM W/LOWER EXTREMITY;  Surgeon: Gretta Lonni PARAS, MD;  Location: [facility] INVASIVE CV LAB;  Service: Cardiovascular;  Laterality: Bilateral;  Lysis infuaion via lt Bypass graft   AMPUTATION TOE Left 02/04/2022   Procedure: AMPUTATION  SECOND TOE;  Surgeon: Gershon Donnice SAUNDERS, DPM;  Location: [facility] OR;  Service: Podiatry;  Laterality: Left;   APPENDECTOMY     CHOLECYSTECTOMY     FEMORAL-TIBIAL BYPASS GRAFT Left 01/29/2022   Procedure: LEFT COMMON FEMORAL BYPASS TO ANTERIOR TIBIAL ARTERY;  Surgeon: Gretta Lonni PARAS, MD;  Location: [facility] OR;  Service: Vascular;  Laterality: Left;   PERIPHERAL VASCULAR INTERVENTION  12/05/2022   Procedure: PERIPHERAL VASCULAR INTERVENTION;  Surgeon: Magda Debby SAILOR, MD;  Location: [facility] INVASIVE CV LAB;  Service: Cardiovascular;;   PERIPHERAL VASCULAR THROMBECTOMY N/A 12/05/2022   Procedure: LYSIS RECHECK;  Surgeon: Magda Debby SAILOR, MD;  Location: [facility] INVASIVE CV LAB;  Service: Cardiovascular;   Laterality: N/A;   PROSTATE SURGERY     VEIN HARVEST Left 01/29/2022   Procedure: HARVEST OF LEFT LEG GREAT SAPHENOUS VEIN;  Surgeon: Gretta Lonni PARAS, MD;  Location: [facility] OR;  Service: Vascular;  Laterality: Left;   Family History  Problem Relation Age of Onset   Cancer Mother        leukemia   Diabetes Mother    Cancer Father 24       lung cancer with mets   Colon cancer Maternal Grandmother    Heart disease Maternal Grandmother    Heart disease Maternal Grandfather    Heart disease Paternal Grandmother    Cancer Paternal Grandfather        lung, prostate   Esophageal cancer Neg Hx    Colon polyps Neg Hx    Stomach cancer Neg Hx    Rectal cancer Neg Hx    Social History   Socioeconomic History   Marital status: Single    Spouse name: GF-Betty   Number of children: 2  Years of education: 70   Highest education level: Not on file  Occupational History   Occupation: supervisor    Employer: [employer]   Tobacco Use   Smoking status: Never    Passive exposure: Never   Smokeless tobacco: Former    Types: Engineer, drilling   Vaping status: Never Used  Substance and Sexual Activity   Alcohol use: Yes    Comment: social   Drug use: Not Currently   Sexual activity: Not Currently  Other Topics Concern   Not on file  Social History Narrative   Marital status: divorced, girlfriend x 1977     Children: 1      Tobacco: none; chew tobacco      Alcohol: beers on weekends      Exercise: none   Social Drivers of Corporate investment banker Strain: Low Risk  (12/02/2023)   Overall Financial Resource Strain (CARDIA)    Difficulty of Paying Living Expenses: Not hard at all  Food Insecurity: No Food Insecurity (12/02/2023)   Hunger Vital Sign    Worried About Running Out of Food in the Last Year: Never true    Ran Out of Food in the Last Year: Never true  Transportation Needs: No Transportation Needs (12/02/2023)   PRAPARE - Scientist, research (physical sciences) (Medical): No    Lack of Transportation (Non-Medical): No  Physical Activity: Insufficiently Active (12/02/2023)   Exercise Vital Sign    Days of Exercise per Week: 4 days    Minutes of Exercise per Session: 10 min  Stress: No Stress Concern Present (12/02/2023)   [employer] of Occupational Health - Occupational Stress Questionnaire    Feeling of Stress: Not at all  Social Connections: Socially Isolated (12/02/2023)   Social Connection and Isolation Panel    Frequency of Communication with Friends and Family: More than three times a week    Frequency of Social Gatherings with Friends and Family: More than three times a week    Attends Religious Services: Never    Database administrator or Organizations: No    Attends Engineer, structural: Never    Marital Status: Divorced    Tobacco Counseling Counseling given: Yes    Clinical Intake:  Pre-visit preparation completed: Yes  Pain : No/denies pain     BMI - recorded: 32.14 Nutritional Status: BMI > 30  Obese Nutritional Risks: None Diabetes: Yes  Lab Results  Component Value Date   HGBA1C 6.6 (H) 10/09/2023   HGBA1C 7.3 (H) 07/21/2023   HGBA1C 7.1 (H) 04/17/2023     How often do you need to have someone help you when you read instructions, pamphlets, or other written materials from your doctor or pharmacy?: 1 - Never  Interpreter Needed?: No  Information entered by :: alia t/cma   Activities of Daily Living     12/02/2023    9:24 AM 12/06/2022    8:38 AM  In your present state of health, do you have any difficulty performing the following activities:  Hearing? 1   Vision? 1   Difficulty concentrating or making decisions? 0   Walking or climbing stairs? 1   Dressing or bathing? 0   Doing errands, shopping? 0 0  Preparing Food and eating ? N   Using the Toilet? N   In the past six months, have you accidently leaked urine? Y   Do you have problems with loss of bowel control? N  Managing your Medications? N   Managing your Finances? N   Housekeeping or managing your Housekeeping? N     Patient Care Team: Severa Rock HERO, FNP as PCP - General (Family Medicine) Karis Clunes, MD as Consulting Physician (Otolaryngology) [facility] Alvia Norleen BIRCH, MD as Consulting Physician (Ophthalmology) Jegede, Olugbemiga E, MD as Consulting Physician (Internal Medicine) Moises Reusing, RN as Case Manager Rakes, Rock HERO, FNP (Family Medicine) Severa Rock HERO, FNP as Attending Physician (Family Medicine)  I have updated your Care Teams any recent Medical Services you may have received from other providers in the past year.     Assessment:   This is a routine wellness examination for [name]  Hearing/Vision screen Hearing Screening - Comments:: Pt have hearing dif-sometimes Vision Screening - Comments:: Pt have vision dif/pt goes Dr. Octavia in [place], [place]/last ov 73yrs ago   Goals Addressed             This Visit's Progress    Exercise 3x per week (30 min per time)   On track      Depression Screen     12/02/2023    9:30 AM 10/09/2023    7:58 AM 09/21/2023    2:33 PM 09/10/2023   11:42 AM 08/12/2023    2:48 PM 07/21/2023    9:29 AM 01/07/2023    8:54 AM  PHQ 2/9 Scores  PHQ - 2 Score 0 0 0 0 0 2 0  PHQ- 9 Score 0 0 0  2 8 0    Fall Risk     12/02/2023    9:22 AM 10/09/2023    7:58 AM 09/21/2023    2:33 PM 09/10/2023   11:41 AM 08/12/2023    2:48 PM  Fall Risk   Falls in the past year? 1 0 1 1 0  Number falls in past yr: 0  0 0 0  Injury with Fall? 1  1 1  0  Risk for fall due to : Impaired balance/gait;Impaired mobility  Impaired mobility No Fall Risks No Fall Risks  Follow up Falls evaluation completed;Education provided   Falls evaluation completed Falls evaluation completed    MEDICARE RISK AT HOME:  Medicare Risk at Home Any stairs in or around the home?: Yes If so, are there any without handrails?: Yes Home free of loose throw  rugs in walkways, pet beds, electrical cords, etc?: Yes Adequate lighting in your home to reduce risk of falls?: Yes Life alert?: No Use of a cane, walker or w/c?: No Grab bars in the bathroom?: Yes Shower chair or bench in shower?: No Elevated toilet seat or a handicapped toilet?: No  TIMED UP AND GO:  Was the test performed?  No  Cognitive Function: 6CIT completed        12/02/2023    9:28 AM 12/01/2022    1:45 PM 11/25/2020    1:14 PM 05/06/2019    9:05 AM  6CIT Screen  What Year? 0 points 0 points 0 points 0 points  What month? 0 points 0 points 0 points 0 points  What time? 0 points 0 points 0 points 0 points  Count back from 20 0 points 0 points 0 points 0 points  Months in reverse 0 points 0 points 4 points 2 points  Repeat phrase 0 points 0 points 2 points 2 points  Total Score 0 points 0 points 6 points 4 points    Immunizations Immunization History  Administered Date(s) Administered   Fluad Trivalent(High  Dose 65+) 01/07/2023   INFLUENZA, HIGH DOSE SEASONAL PF 11/24/2019   Influenza Split 11/20/2020   Influenza,inj,Quad PF,6+ Mos 11/04/2016, 12/27/2017, 11/09/2018   Influenza-Unspecified 11/16/2015, 12/27/2017, 12/27/2017, 12/12/2020, 12/04/2021   Moderna Sars-Covid-2 Vaccination 05/13/2019, 06/15/2019, 02/23/2020, 12/20/2020   PNEUMOCOCCAL CONJUGATE-20 10/07/2022   Pneumococcal Polysaccharide-23 08/04/2015, 11/09/2018   Td 03/07/1995   Td (Adult),5 Lf Tetanus Toxid, Preservative Free 03/07/1995   Tdap 09/26/2012, 07/14/2017, 07/29/2023   Varicella 12/04/2021   Zoster Recombinant(Shingrix) 12/04/2021    Screening Tests Health Maintenance  Topic Date Due   Zoster Vaccines- Shingrix (2 of 2) 01/29/2022   COVID-19 Vaccine (5 - 2025-26 season) 11/02/2023   Influenza Vaccine  05/31/2024 (Originally 10/02/2023)   FOOT EXAM  01/07/2024   HEMOGLOBIN A1C  04/10/2024   Colonoscopy  07/24/2024   OPHTHALMOLOGY EXAM  09/22/2024   Diabetic kidney evaluation - eGFR  measurement  10/08/2024   Diabetic kidney evaluation - Urine ACR  10/08/2024   Medicare Annual Wellness (AWV)  12/01/2024   DTaP/Tdap/Td (6 - Td or Tdap) 07/28/2033   Pneumococcal Vaccine: 50+ Years  Completed   Hepatitis C Screening  Completed   HPV VACCINES  Aged Out   Meningococcal B Vaccine  Aged Out    Health Maintenance Items Addressed: See Nurse Notes at the end of this note  Additional Screening:  Vision Screening: Recommended annual ophthalmology exams for early detection of glaucoma and other disorders of the eye. Is the patient up to date with their annual eye exam?  Yes  Who is the provider or what is the name of the office in which the patient attends annual eye exams? Dr Octavia in [place], [place]  Dental Screening: Recommended annual dental exams for proper oral hygiene  Community Resource Referral / Chronic Care Management: CRR required this visit?  No   CCM required this visit?  No   Plan:    I have personally reviewed and noted the following in the patient's chart:   Medical and social history Use of alcohol, tobacco or illicit drugs  Current medications and supplements including opioid prescriptions. Patient is not currently taking opioid prescriptions. Functional ability and status Nutritional status Physical activity Advanced directives List of other physicians Hospitalizations, surgeries, and ER visits in previous 12 months Vitals Screenings to include cognitive, depression, and falls Referrals and appointments  In addition, I have reviewed and discussed with patient certain preventive protocols, quality metrics, and best practice recommendations. A written personalized care plan for preventive services as well as general preventive health recommendations were provided to patient.   [name], CMA   12/02/2023   After Visit Summary: (MyChart) Due to this being a telephonic visit, the after visit summary with patients personalized plan was offered  to patient via MyChart   Notes: Nothing significant to report at this time.

## 2023-12-21 ENCOUNTER — Encounter (INDEPENDENT_AMBULATORY_CARE_PROVIDER_SITE_OTHER): Admitting: Ophthalmology

## 2023-12-25 ENCOUNTER — Encounter (INDEPENDENT_AMBULATORY_CARE_PROVIDER_SITE_OTHER): Admitting: Ophthalmology

## 2023-12-25 DIAGNOSIS — Z7984 Long term (current) use of oral hypoglycemic drugs: Secondary | ICD-10-CM

## 2023-12-25 DIAGNOSIS — I10 Essential (primary) hypertension: Secondary | ICD-10-CM | POA: Diagnosis not present

## 2023-12-25 DIAGNOSIS — E113313 Type 2 diabetes mellitus with moderate nonproliferative diabetic retinopathy with macular edema, bilateral: Secondary | ICD-10-CM

## 2023-12-25 DIAGNOSIS — H35033 Hypertensive retinopathy, bilateral: Secondary | ICD-10-CM | POA: Diagnosis not present

## 2023-12-25 DIAGNOSIS — H43813 Vitreous degeneration, bilateral: Secondary | ICD-10-CM

## 2024-01-12 ENCOUNTER — Ambulatory Visit: Payer: Self-pay | Admitting: Family Medicine

## 2024-01-12 ENCOUNTER — Encounter: Payer: Self-pay | Admitting: Family Medicine

## 2024-01-12 VITALS — BP 126/70 | HR 74 | Temp 97.5°F | Ht 70.0 in | Wt 236.8 lb

## 2024-01-12 DIAGNOSIS — E1142 Type 2 diabetes mellitus with diabetic polyneuropathy: Secondary | ICD-10-CM | POA: Diagnosis not present

## 2024-01-12 DIAGNOSIS — Z7984 Long term (current) use of oral hypoglycemic drugs: Secondary | ICD-10-CM

## 2024-01-12 DIAGNOSIS — G4733 Obstructive sleep apnea (adult) (pediatric): Secondary | ICD-10-CM | POA: Diagnosis not present

## 2024-01-12 DIAGNOSIS — I70222 Atherosclerosis of native arteries of extremities with rest pain, left leg: Secondary | ICD-10-CM

## 2024-01-12 DIAGNOSIS — E113553 Type 2 diabetes mellitus with stable proliferative diabetic retinopathy, bilateral: Secondary | ICD-10-CM

## 2024-01-12 DIAGNOSIS — I739 Peripheral vascular disease, unspecified: Secondary | ICD-10-CM

## 2024-01-12 DIAGNOSIS — R7989 Other specified abnormal findings of blood chemistry: Secondary | ICD-10-CM | POA: Diagnosis not present

## 2024-01-12 DIAGNOSIS — E1169 Type 2 diabetes mellitus with other specified complication: Secondary | ICD-10-CM | POA: Diagnosis not present

## 2024-01-12 DIAGNOSIS — L97509 Non-pressure chronic ulcer of other part of unspecified foot with unspecified severity: Secondary | ICD-10-CM | POA: Insufficient documentation

## 2024-01-12 DIAGNOSIS — I2510 Atherosclerotic heart disease of native coronary artery without angina pectoris: Secondary | ICD-10-CM

## 2024-01-12 DIAGNOSIS — E785 Hyperlipidemia, unspecified: Secondary | ICD-10-CM | POA: Diagnosis not present

## 2024-01-12 DIAGNOSIS — I152 Hypertension secondary to endocrine disorders: Secondary | ICD-10-CM

## 2024-01-12 DIAGNOSIS — K219 Gastro-esophageal reflux disease without esophagitis: Secondary | ICD-10-CM | POA: Diagnosis not present

## 2024-01-12 DIAGNOSIS — E1159 Type 2 diabetes mellitus with other circulatory complications: Secondary | ICD-10-CM

## 2024-01-12 LAB — BAYER DCA HB A1C WAIVED: HB A1C (BAYER DCA - WAIVED): 8 % — ABNORMAL HIGH (ref 4.8–5.6)

## 2024-01-12 NOTE — Assessment & Plan Note (Signed)
 N/A to pt, resolved

## 2024-01-12 NOTE — Progress Notes (Signed)
 Subjective:  Patient ID: [name], male    DOB: 22-Jul-1950, 73 y.o.   MRN: [id number]  Patient Care Team: Severa Rock HERO, FNP as PCP - General (Family Medicine) Karis Clunes, MD as Consulting Physician (Otolaryngology) [facility] Alvia Norleen BIRCH, MD as Consulting Physician (Ophthalmology) Jegede, Olugbemiga E, MD as Consulting Physician (Internal Medicine) Moises Reusing, RN as Case Manager Maninder Deboer, Rock HERO, FNP (Family Medicine) Severa Rock HERO, FNP as Attending Physician (Family Medicine)   Chief Complaint:  Medical Management of Chronic Issues   HPI: [name]. is a 73 y.o. male presenting on 01/12/2024 for Medical Management of Chronic Issues   [name]. is a 73 year old male with diabetes and hyperlipidemia who presents with worsening leg pain and cramps.  Lower extremity pain and muscle cramps - Severe pain and cramps in both legs, primarily from the knees down - Pain is severe enough to 'paralyze' him at times - Exacerbated at night, interfering with sleep - Ambulates up and down the hall to alleviate cramps - Uses over-the-counter 'leg cramp' medication; requires two pills for relief - Muscle aches and cramps present - Heat application provides symptomatic relief - No leg swelling - Has visit with vascular surgery scheduled next month, no color changes to lower legs, feet.   Glycemic control and dietary habits - Diabetes managed with metformin  - Morning blood glucose measured at 248 mg/dL after consuming three yogurts overnight - Weight gain of six pounds since last visit - No increased hunger, thirst, or urination - Admits to eating more than usual and acknowledges poor dietary habits  Nocturnal dyspnea and sleep-related breathing disturbance - Difficulty breathing at night - History of sleep apnea; has not used CPAP in many years - Previously informed that sleep apnea was resolved - Experiences loud noises during  nocturnal breathing - History of ENT evaluation with recommendation for tonsil and palate surgery, which was not pursued  Cardiopulmonary symptoms - No chest pain - No shortness of breath  Gastrointestinal symptoms - Gastroesophageal reflux managed with pantoprazole  - Reflux is well-controlled - No recent gastroenterology evaluation in over 30 years        Relevant past medical, surgical, family, and social history reviewed and updated as indicated.  Allergies and medications reviewed and updated. Data reviewed: Chart in Epic.   Past Medical History:  Diagnosis Date   Allergy    seasonal   Arthritis     all over me- back, hips, knees, hands, everywhere    Cancer [facility])    Prostate cancer age 71; Lupron treatments followed by prostatectomy.   Cataract    left removed, forming right   Chronic constipation    x 23 yrs after prostate surgery   Diabetes mellitus without complication (HCC)    Diabetic retinal damage of both eyes (HCC)    gets shots every month   GERD (gastroesophageal reflux disease)    Hyperlipidemia    Hypertension    Myocardial infarction [facility])    age 68; no stenting.   OSA (obstructive sleep apnea)    non-compliant with CPAP.   Sleep apnea    no cpap    Past Surgical History:  Procedure Laterality Date   ABDOMINAL AORTOGRAM W/LOWER EXTREMITY N/A 01/17/2022   Procedure: ABDOMINAL AORTOGRAM W/LOWER EXTREMITY;  Surgeon: Eliza Lonni RAMAN, MD;  Location: [facility] INVASIVE CV LAB;  Service: Cardiovascular;  Laterality: N/A;   ABDOMINAL AORTOGRAM W/LOWER EXTREMITY Bilateral 12/04/2022  Procedure: ABDOMINAL AORTOGRAM W/LOWER EXTREMITY;  Surgeon: Gretta Lonni PARAS, MD;  Location: [facility] INVASIVE CV LAB;  Service: Cardiovascular;  Laterality: Bilateral;  Lysis infuaion via lt Bypass graft   AMPUTATION TOE Left 02/04/2022   Procedure: AMPUTATION  SECOND TOE;  Surgeon: Gershon Donnice SAUNDERS, DPM;  Location: [facility] OR;  Service: Podiatry;  Laterality: Left;    APPENDECTOMY     CHOLECYSTECTOMY     FEMORAL-TIBIAL BYPASS GRAFT Left 01/29/2022   Procedure: LEFT COMMON FEMORAL BYPASS TO ANTERIOR TIBIAL ARTERY;  Surgeon: Gretta Lonni PARAS, MD;  Location: [facility] OR;  Service: Vascular;  Laterality: Left;   PERIPHERAL VASCULAR INTERVENTION  12/05/2022   Procedure: PERIPHERAL VASCULAR INTERVENTION;  Surgeon: Magda Debby SAILOR, MD;  Location: [facility] INVASIVE CV LAB;  Service: Cardiovascular;;   PERIPHERAL VASCULAR THROMBECTOMY N/A 12/05/2022   Procedure: LYSIS RECHECK;  Surgeon: Magda Debby SAILOR, MD;  Location: [facility] INVASIVE CV LAB;  Service: Cardiovascular;  Laterality: N/A;   PROSTATE SURGERY     VEIN HARVEST Left 01/29/2022   Procedure: HARVEST OF LEFT LEG GREAT SAPHENOUS VEIN;  Surgeon: Gretta Lonni PARAS, MD;  Location: [facility] OR;  Service: Vascular;  Laterality: Left;    Social History   Socioeconomic History   Marital status: Single    Spouse name: GF-Betty   Number of children: 2   Years of education: 11   Highest education level: Not on file  Occupational History   Occupation: supervisor    Employer: [employer]   Tobacco Use   Smoking status: Never    Passive exposure: Never   Smokeless tobacco: Former    Types: Engineer, Drilling   Vaping status: Never Used  Substance and Sexual Activity   Alcohol use: Yes    Comment: social   Drug use: Not Currently   Sexual activity: Not Currently  Other Topics Concern   Not on file  Social History Narrative   Marital status: divorced, girlfriend x 1977     Children: 1      Tobacco: none; chew tobacco      Alcohol: beers on weekends      Exercise: none   Social Drivers of Corporate Investment Banker Strain: Low Risk  (12/02/2023)   Overall Financial Resource Strain (CARDIA)    Difficulty of Paying Living Expenses: Not hard at all  Food Insecurity: No Food Insecurity (12/02/2023)   Hunger Vital Sign    Worried About Running Out of Food in the Last Year: Never true    Ran Out of Food in the  Last Year: Never true  Transportation Needs: No Transportation Needs (12/02/2023)   PRAPARE - Administrator, Civil Service (Medical): No    Lack of Transportation (Non-Medical): No  Physical Activity: Insufficiently Active (12/02/2023)   Exercise Vital Sign    Days of Exercise per Week: 4 days    Minutes of Exercise per Session: 10 min  Stress: No Stress Concern Present (12/02/2023)   [employer] of Occupational Health - Occupational Stress Questionnaire    Feeling of Stress: Not at all  Social Connections: Socially Isolated (12/02/2023)   Social Connection and Isolation Panel    Frequency of Communication with Friends and Family: More than three times a week    Frequency of Social Gatherings with Friends and Family: More than three times a week    Attends Religious Services: Never    Database Administrator or Organizations: No    Attends Banker Meetings: Never  Marital Status: Divorced  Catering Manager Violence: Not At Risk (12/02/2023)   Humiliation, Afraid, Rape, and Kick questionnaire    Fear of Current or Ex-Partner: No    Emotionally Abused: No    Physically Abused: No    Sexually Abused: No    Outpatient Encounter Medications as of 01/12/2024  Medication Sig   aspirin  81 MG tablet Take 81 mg by mouth daily.   BAYER CONTOUR NEXT TEST test strip CHECK BLOOD SUGAR TWICE DAILY   BAYER MICROLET LANCETS lancets Check blood sugar two times daily.   Blood Glucose Monitoring Suppl (BAYER CONTOUR NEXT MONITOR) w/Device KIT Check blood sugar two times daily.   ciprofloxacin  (CILOXAN ) 0.3 % ophthalmic solution Place 1 drop into both eyes See admin instructions. NSTILL ONE DROP INTO BOTH EYES 4 TIMES A DAY FOR TWO DAYS AFTER EACH MONTHLY EYE INJECTION   fluticasone  (FLONASE ) 50 MCG/ACT nasal spray Place 1 spray into both nostrils daily as needed for allergies or rhinitis.   gabapentin  (NEURONTIN ) 300 MG capsule TAKE 3 CAPSULES BY MOUTH 2 TIMES DAILY    lisinopril  (ZESTRIL ) 5 MG tablet Take 1 tablet (5 mg total) by mouth daily.   metFORMIN  (GLUCOPHAGE ) 500 MG tablet Take 3 tablets (1,500 mg total) by mouth 2 (two) times daily with a meal.   pantoprazole  (PROTONIX ) 40 MG tablet TAKE 1 TABLET (40 MG TOTAL) BY MOUTH TWICE A DAY BEFORE MEALS   rosuvastatin  (CRESTOR ) 20 MG tablet Take 1 tablet (20 mg total) by mouth daily.   No facility-administered encounter medications on file as of 01/12/2024.    Allergies  Allergen Reactions   Cymbalta  [Duloxetine  Hcl] Nausea And Vomiting   Darvocet [Propoxyphene N-Acetaminophen ] Hives    Pertinent ROS per HPI, otherwise unremarkable      Objective:  BP 126/70   Pulse 74   Temp (!) 97.5 F (36.4 C)   Ht 5' 10 (1.778 m)   Wt 236 lb 12.8 oz (107.4 kg)   SpO2 95%   BMI 33.98 kg/m    Wt Readings from Last 3 Encounters:  01/12/24 236 lb 12.8 oz (107.4 kg)  12/02/23 224 lb (101.6 kg)  11/03/23 224 lb (101.6 kg)    Physical Exam Vitals and nursing note reviewed.  Constitutional:      General: He is not in acute distress.    Appearance: Normal appearance. He is obese. He is not ill-appearing, toxic-appearing or diaphoretic.  HENT:     Head: Normocephalic and atraumatic.     Mouth/Throat:     Mouth: Mucous membranes are moist.  Eyes:     Pupils: Pupils are equal, round, and reactive to light.  Cardiovascular:     Rate and Rhythm: Normal rate and regular rhythm.     Heart sounds: Normal heart sounds.  Pulmonary:     Effort: Pulmonary effort is normal.     Breath sounds: Normal breath sounds.  Musculoskeletal:     Right lower leg: No edema.     Left lower leg: No edema.  Skin:    General: Skin is warm and dry.     Capillary Refill: Capillary refill takes less than 2 seconds.     Findings: No lesion, rash or wound.  Neurological:     General: No focal deficit present.     Mental Status: He is alert and oriented to person, place, and time.     Gait: Gait abnormal (slow, antalgic).   Psychiatric:        Mood and Affect:  Mood normal.        Behavior: Behavior normal.        Thought Content: Thought content normal.        Judgment: Judgment normal.      Results for orders placed or performed during the hospital encounter of 11/03/23  VAS US  ABI WITH/WO TBI   Collection Time: 11/03/23  1:51 PM  Result Value Ref Range   Right ABI N/C    Left ABI .84        Pertinent labs & imaging results that were available during my care of the patient were reviewed by me and considered in my medical decision making.  Assessment & Plan:  Manolo was seen today for medical management of chronic issues.  Diagnoses and all orders for this visit:  Type 2 diabetes mellitus with other specified complication, without long-term current use of insulin  (HCC) -     TSH -     T4, free -     Lipid panel -     CMP14+EGFR -     Bayer DCA Hb A1c Waived  Hyperlipidemia associated with type 2 diabetes mellitus (HCC) -     TSH -     T4, free -     Lipid panel -     CMP14+EGFR -     Bayer DCA Hb A1c Waived  Hypertension associated with type 2 diabetes mellitus (HCC) -     TSH -     T4, free -     Lipid panel -     CMP14+EGFR -     Bayer DCA Hb A1c Waived  Elevated TSH -     TSH -     T4, free  Stable proliferative diabetic retinopathy of both eyes associated with type 2 diabetes mellitus (HCC) -     CMP14+EGFR -     Bayer DCA Hb A1c Waived  Diabetic polyneuropathy associated with type 2 diabetes mellitus (HCC) -     Lipid panel -     CMP14+EGFR -     Bayer DCA Hb A1c Waived  Critical limb ischemia of left lower extremity (HCC) -     Lipid panel -     CMP14+EGFR -     Bayer DCA Hb A1c Waived  PAD (peripheral artery disease) -     Lipid panel -     CMP14+EGFR -     Bayer DCA Hb A1c Waived  Coronary artery disease involving native coronary artery of native heart without angina pectoris -     TSH -     T4, free -     Lipid panel -     CMP14+EGFR -     Bayer DCA Hb  A1c Waived  OSA (obstructive sleep apnea) -     Ambulatory referral to Sleep Studies  Gastroesophageal reflux disease without esophagitis -     CMP14+EGFR      Type 2 diabetes mellitus with polyneuropathy, proliferative diabetic retinopathy, and circulatory complications Blood sugar levels have been elevated, with a recent reading of 248 mg/dL. A1c has increased to 8.0% from 6.6% previously, indicating poor glycemic control. Reports increased snacking and weight gain, contributing to elevated blood sugar levels. No new sores or wounds on feet.  - Implement dietary changes over the next three months to improve glycemic control. - Monitor blood sugar levels regularly. - Will reassess A1c in three months. - Will consider medication adjustment if A1c does not improve.  Atherosclerosis of native arteries of  left leg with rest pain and peripheral vascular disease Reports significant leg pain, particularly at night, which has worsened over the last month. Pain is severe enough to disrupt sleep. Continues to use gabapentin  for pain management. No new sores or wounds on legs. - Continue gabapentin  for pain management. - Consider topical treatments like Biofreeze for symptomatic relief.  Atherosclerotic heart disease of native coronary artery without angina No current chest pain or exertional dyspnea. Continues to take Crestor  for cholesterol management. Reports severe leg cramps, possibly related to statin use. - Continue Crestor  for cholesterol management. - Checked potassium levels to assess for electrolyte imbalance. - Consider CoQ10 supplementation to alleviate muscle cramps associated with statin use.  Obstructive sleep apnea (history of) Reports difficulty breathing at night and disrupted sleep. Previous CPAP therapy was not tolerated due to nasal issues. No current CPAP use. Interested in exploring alternative treatments such as a mouthpiece. - Referred to Dr. Oma for evaluation and  potential fitting of a mouthpiece for sleep apnea management.  Gastroesophageal reflux disease Continues to take pantoprazole  for reflux management. No recent GI evaluation or endoscopy in the past 30 years. - Continue pantoprazole  for reflux management. - Will consider referral to GI for further evaluation if symptoms persist.  General Health Maintenance Blood pressure is well-controlled with current medication regimen. Continues to take lisinopril  and aspirin . - Continue current medications for blood pressure management.          Continue all other maintenance medications.  Follow up plan: Return in about 3 months (around 04/13/2024) for DM.   Continue healthy lifestyle choices, including diet (rich in fruits, vegetables, and lean proteins, and low in salt and simple carbohydrates) and exercise (at least 30 minutes of moderate physical activity daily).  Educational handout given for DM  The above assessment and management plan was discussed with the patient. The patient verbalized understanding of and has agreed to the management plan. Patient is aware to call the clinic if they develop any new symptoms or if symptoms persist or worsen. Patient is aware when to return to the clinic for a follow-up visit. Patient educated on when it is appropriate to go to the emergency department.   Rosaline Bruns, FNP-C [facility] [phone]

## 2024-01-12 NOTE — Patient Instructions (Addendum)

## 2024-01-13 LAB — LIPID PANEL
Chol/HDL Ratio: 4.7 ratio (ref 0.0–5.0)
Cholesterol, Total: 177 mg/dL (ref 100–199)
HDL: 38 mg/dL — ABNORMAL LOW (ref >39)
LDL Chol Calc (NIH): 112 mg/dL — ABNORMAL HIGH (ref 0–99)
Triglycerides: 150 mg/dL — ABNORMAL HIGH (ref 0–149)
VLDL Cholesterol Cal: 27 mg/dL (ref 5–40)

## 2024-01-13 LAB — T4, FREE: Free T4: 1.05 ng/dL (ref 0.82–1.77)

## 2024-01-13 LAB — CMP14+EGFR
ALT: 16 IU/L (ref 0–44)
AST: 15 IU/L (ref 0–40)
Albumin: 4.2 g/dL (ref 3.9–4.9)
Alkaline Phosphatase: 85 IU/L (ref 47–123)
BUN/Creatinine Ratio: 11 (ref 10–24)
BUN: 12 mg/dL (ref 8–27)
Bilirubin Total: 0.8 mg/dL (ref 0.0–1.2)
CO2: 23 mmol/L (ref 20–29)
Calcium: 9 mg/dL (ref 8.6–10.2)
Chloride: 101 mmol/L (ref 96–106)
Creatinine, Ser: 1.06 mg/dL (ref 0.76–1.27)
Globulin, Total: 2.1 g/dL (ref 1.5–4.5)
Glucose: 192 mg/dL — ABNORMAL HIGH (ref 70–99)
Potassium: 4.7 mmol/L (ref 3.5–5.2)
Sodium: 139 mmol/L (ref 134–144)
Total Protein: 6.3 g/dL (ref 6.0–8.5)
eGFR: 75 mL/min/1.73 (ref >59)

## 2024-01-13 LAB — TSH: TSH: 6.69 u[IU]/mL — ABNORMAL HIGH (ref 0.450–4.500)

## 2024-01-19 ENCOUNTER — Telehealth: Payer: Self-pay | Admitting: Pharmacist

## 2024-01-19 NOTE — Telephone Encounter (Signed)
   This patient is appearing on a report for being at risk of failing the adherence measure for cholesterol (statin) medications this calendar year.   Medication: rosuvastatin  Hasn't filled in 2025 LDL >100  MyChart message sent to patient. Will continue to follow   Toyna Erisman Dattero Blannie Shedlock, PharmD, BCACP, CPP Clinical Pharmacist, [facility]

## 2024-01-21 ENCOUNTER — Other Ambulatory Visit: Payer: Self-pay | Admitting: Podiatry

## 2024-01-21 ENCOUNTER — Other Ambulatory Visit: Payer: Self-pay | Admitting: Family Medicine

## 2024-01-21 DIAGNOSIS — E1142 Type 2 diabetes mellitus with diabetic polyneuropathy: Secondary | ICD-10-CM

## 2024-01-21 NOTE — Telephone Encounter (Signed)
 Copied from CRM #[id number]. Topic: Clinical - Medication Refill >> Jan 21, 2024 12:33 PM [name] wrote: Medication:  rosuvastatin  (CRESTOR ) 20 MG tablet - FNP Rakes sent a message through MyChart and [name] does want to refill this medication.   Has the patient contacted their pharmacy? Yes (Agent: If no, request that the patient contact the pharmacy for the refill. If patient does not wish to contact the pharmacy document the reason why and proceed with request.) (Agent: If yes, when and what did the pharmacy advise?) Pharmacy needs order to refill  This is the patient's preferred pharmacy:  [id number] - [place], [place] - [address] [place] [address] Phone: [phone] Fax: [phone] Is this the correct pharmacy for this prescription? Yes If no, delete pharmacy and type the correct one.   Has the prescription been filled recently? No  Is the patient out of the medication? No  Has the patient been seen for an appointment in the last year OR does the patient have an upcoming appointment? Yes  Can we respond through MyChart? No  Agent: Please be advised that Rx refills may take up to 3 business days. We ask that you follow-up with your pharmacy.

## 2024-01-22 ENCOUNTER — Encounter (INDEPENDENT_AMBULATORY_CARE_PROVIDER_SITE_OTHER): Admitting: Ophthalmology

## 2024-01-22 DIAGNOSIS — H43813 Vitreous degeneration, bilateral: Secondary | ICD-10-CM

## 2024-01-22 DIAGNOSIS — E113313 Type 2 diabetes mellitus with moderate nonproliferative diabetic retinopathy with macular edema, bilateral: Secondary | ICD-10-CM | POA: Diagnosis not present

## 2024-01-22 DIAGNOSIS — Z7984 Long term (current) use of oral hypoglycemic drugs: Secondary | ICD-10-CM

## 2024-01-22 DIAGNOSIS — I10 Essential (primary) hypertension: Secondary | ICD-10-CM | POA: Diagnosis not present

## 2024-01-22 DIAGNOSIS — H35033 Hypertensive retinopathy, bilateral: Secondary | ICD-10-CM | POA: Diagnosis not present

## 2024-01-22 DIAGNOSIS — H2511 Age-related nuclear cataract, right eye: Secondary | ICD-10-CM

## 2024-01-26 MED ORDER — ROSUVASTATIN CALCIUM 20 MG PO TABS: 20.0000 mg | ORAL_TABLET | Freq: Every day | ORAL | 3 refills | Status: AC

## 2024-02-01 ENCOUNTER — Ambulatory Visit: Payer: Self-pay

## 2024-02-01 NOTE — Telephone Encounter (Signed)
 FYI Only or Action Required?: FYI only for provider: scheduled with podiatrist 12/4.  Patient was last seen in primary care on 01/12/2024 by Severa Rock HERO, FNP.  Called Nurse Triage reporting Foot Injury.  Symptoms began about a month ago.  Interventions attempted: Nothing.  Symptoms are: stable.  Triage Disposition: See PCP Within 2 Weeks  Patient/caregiver understands and will follow disposition?: Yes Reason for Disposition  [1] MILD pain (e.g., does not interfere with normal activities) AND [2] present > 7 days  Answer Assessment - Initial Assessment Questions Patient states all he can see is 2 small little black specks. 8-9 months ago had a callus / dry spot that opened up but has since healed. Patient has an appointment on 12/4 with [facility]. Patient states he thought it was the 18th., Advised its scheduled for 12/4  1. ONSET: When did the pain start?      2 weeks  2. LOCATION: Where is the pain located?      Left heel  3. PAIN: How bad is the pain?    (Scale 1-10; or mild, moderate, severe)     4/10  4. WORK OR EXERCISE: Has there been any recent work or exercise that involved this part of the body?      Denies  5. CAUSE: What do you think is causing the foot pain?     Unsure  6. OTHER SYMPTOMS: Do you have any other symptoms? (e.g., leg pain, rash, fever, numbness)     Denies  Protocols used: Foot Pain-A-AH  Copied from CRM [id number]. Topic: Clinical - Red Word Triage >> Feb 01, 2024  1:07 PM Thliyah D wrote: Pt got something on his left leg heel. Says it's so sore when he walks he almost fall. Been that way for weeks.

## 2024-02-04 ENCOUNTER — Ambulatory Visit: Admitting: Podiatry

## 2024-02-04 ENCOUNTER — Ambulatory Visit (INDEPENDENT_AMBULATORY_CARE_PROVIDER_SITE_OTHER)

## 2024-02-04 ENCOUNTER — Encounter: Payer: Self-pay | Admitting: Podiatry

## 2024-02-04 VITALS — Ht 70.0 in | Wt 236.8 lb

## 2024-02-04 DIAGNOSIS — M722 Plantar fascial fibromatosis: Secondary | ICD-10-CM | POA: Diagnosis not present

## 2024-02-04 DIAGNOSIS — B351 Tinea unguium: Secondary | ICD-10-CM | POA: Diagnosis not present

## 2024-02-04 DIAGNOSIS — L97522 Non-pressure chronic ulcer of other part of left foot with fat layer exposed: Secondary | ICD-10-CM

## 2024-02-04 DIAGNOSIS — M79674 Pain in right toe(s): Secondary | ICD-10-CM | POA: Diagnosis not present

## 2024-02-04 DIAGNOSIS — M79675 Pain in left toe(s): Secondary | ICD-10-CM | POA: Diagnosis not present

## 2024-02-04 DIAGNOSIS — E1142 Type 2 diabetes mellitus with diabetic polyneuropathy: Secondary | ICD-10-CM | POA: Diagnosis not present

## 2024-02-04 NOTE — Progress Notes (Signed)
 Subjective: Chief Complaint  Patient presents with   Foot Pain    Pt is here due to left heel pain, he states this has been going on for a few weeks, states when he walks it causes pain, no relief with medication.    73 year old male presents as above concerns.  Has been experiencing pain to the bottom of the left heel for the last few weeks.  He states it hurts to walk at times.  No injuries that he reports.  No treatment.  No open lesions.   States his nails are thickened elongated cannot trim himself may cause discomfort.  No open lesions that he reports.  No other concerns to his feet today.     Objective: AAO x3, NAD DP/PT pulses palpable bilaterally, CRT less than 3 seconds Sensation decreased Nails are hypertrophic, dystrophic, brittle, discolored, elongated 9. No surrounding redness or drainage. Tenderness nails 1-5 bilaterally except the left second toe which has been amputated. Dry skin present at the heel without any open lesions bilaterally. There is tenderness palpation on plantar aspect the left heel.  There is no pain with lateral compression of the calcaneus.  There is no pain on the Achilles tendon.  There is no other areas of pinpoint tenderness.  No edema.  MMT 5/5. No pain with calf compression, swelling, warmth, erythema  Assessment: 73 year old male with symptomatic onychomycosis; neuropathy; heel pain   Plan: Symptomatic onychomycosis -Sharply debrided nails x 9 without any complications or bleeding  Dry skin -Continue moisturizer.  Neuropathy - Continue gabapentin . - Daily foot inspection  Heel pain, likely plantar fasciitis - X-rays were obtained reviewed.  Multiple views obtained. Vessel calcification present. Posterior calcaneal spur. Previous second toe amputation. No evidence of acute fracture.  - Discusses stretching/icing on a regular basis - Gel heel pad - Continue supportive shoegear - Topical medications such a voltaren  gel.   Donnice JONELLE Fees DPM

## 2024-02-04 NOTE — Patient Instructions (Signed)
 Plantar Fasciitis (Heel Spur Syndrome) with Rehab The plantar fascia is a fibrous, ligament-like, soft-tissue structure that spans the bottom of the foot. Plantar fasciitis is a condition that causes pain in the foot due to inflammation of the tissue. SYMPTOMS  Pain and tenderness on the underneath side of the foot. Pain that worsens with standing or walking. CAUSES  Plantar fasciitis is caused by irritation and injury to the plantar fascia on the underneath side of the foot. Common mechanisms of injury include: Direct trauma to bottom of the foot. Damage to a small nerve that runs under the foot where the main fascia attaches to the heel bone. Stress placed on the plantar fascia due to bone spurs. RISK INCREASES WITH:  Activities that place stress on the plantar fascia (running, jumping, pivoting, or cutting). Poor strength and flexibility. Improperly fitted shoes. Tight calf muscles. Flat feet. Failure to warm-up properly before activity. Obesity. PREVENTION Warm up and stretch properly before activity. Allow for adequate recovery between workouts. Maintain physical fitness: Strength, flexibility, and endurance. Cardiovascular fitness. Maintain a health body weight. Avoid stress on the plantar fascia. Wear properly fitted shoes, including arch supports for individuals who have flat feet.  PROGNOSIS  If treated properly, then the symptoms of plantar fasciitis usually resolve without surgery. However, occasionally surgery is necessary.  RELATED COMPLICATIONS  Recurrent symptoms that may result in a chronic condition. Problems of the lower back that are caused by compensating for the injury, such as limping. Pain or weakness of the foot during push-off following surgery. Chronic inflammation, scarring, and partial or complete fascia tear, occurring more often from repeated injections.  TREATMENT  Treatment initially involves the use of ice and medication to help reduce pain and  inflammation. The use of strengthening and stretching exercises may help reduce pain with activity, especially stretches of the Achilles tendon. These exercises may be performed at home or with a therapist. Your caregiver may recommend that you use heel cups of arch supports to help reduce stress on the plantar fascia. Occasionally, corticosteroid injections are given to reduce inflammation. If symptoms persist for greater than 6 months despite non-surgical (conservative), then surgery may be recommended.   MEDICATION  If pain medication is necessary, then nonsteroidal anti-inflammatory medications, such as aspirin  and ibuprofen, or other minor pain relievers, such as acetaminophen , are often recommended. Do not take pain medication within 7 days before surgery. Prescription pain relievers may be given if deemed necessary by your caregiver. Use only as directed and only as much as you need. Corticosteroid injections may be given by your caregiver. These injections should be reserved for the most serious cases, because they may only be given a certain number of times.  HEAT AND COLD Cold treatment (icing) relieves pain and reduces inflammation. Cold treatment should be applied for 10 to 15 minutes every 2 to 3 hours for inflammation and pain and immediately after any activity that aggravates your symptoms. Use ice packs or massage the area with a piece of ice (ice massage). Heat treatment may be used prior to performing the stretching and strengthening activities prescribed by your caregiver, physical therapist, or athletic trainer. Use a heat pack or soak the injury in warm water .  SEEK IMMEDIATE MEDICAL CARE IF: Treatment seems to offer no benefit, or the condition worsens. Any medications produce adverse side effects.  EXERCISES- RANGE OF MOTION (ROM) AND STRETCHING EXERCISES - Plantar Fasciitis (Heel Spur Syndrome) These exercises may help you when beginning to rehabilitate your injury. Your  symptoms may resolve with or without further involvement from your physician, physical therapist or athletic trainer. While completing these exercises, remember:  Restoring tissue flexibility helps normal motion to return to the joints. This allows healthier, less painful movement and activity. An effective stretch should be held for at least 30 seconds. A stretch should never be painful. You should only feel a gentle lengthening or release in the stretched tissue.  RANGE OF MOTION - Toe Extension, Flexion Sit with your right / left leg crossed over your opposite knee. Grasp your toes and gently pull them back toward the top of your foot. You should feel a stretch on the bottom of your toes and/or foot. Hold this stretch for 10 seconds. Now, gently pull your toes toward the bottom of your foot. You should feel a stretch on the top of your toes and or foot. Hold this stretch for 10 seconds. Repeat  times. Complete this stretch 3 times per day.   RANGE OF MOTION - Ankle Dorsiflexion, Active Assisted Remove shoes and sit on a chair that is preferably not on a carpeted surface. Place right / left foot under knee. Extend your opposite leg for support. Keeping your heel down, slide your right / left foot back toward the chair until you feel a stretch at your ankle or calf. If you do not feel a stretch, slide your bottom forward to the edge of the chair, while still keeping your heel down. Hold this stretch for 10 seconds. Repeat 3 times. Complete this stretch 2 times per day.   STRETCH  Gastroc, Standing Place hands on wall. Extend right / left leg, keeping the front knee somewhat bent. Slightly point your toes inward on your back foot. Keeping your right / left heel on the floor and your knee straight, shift your weight toward the wall, not allowing your back to arch. You should feel a gentle stretch in the right / left calf. Hold this position for 10 seconds. Repeat 3 times. Complete this  stretch 2 times per day.  STRETCH  Soleus, Standing Place hands on wall. Extend right / left leg, keeping the other knee somewhat bent. Slightly point your toes inward on your back foot. Keep your right / left heel on the floor, bend your back knee, and slightly shift your weight over the back leg so that you feel a gentle stretch deep in your back calf. Hold this position for 10 seconds. Repeat 3 times. Complete this stretch 2 times per day.  STRETCH  Gastrocsoleus, Standing  Note: This exercise can place a lot of stress on your foot and ankle. Please complete this exercise only if specifically instructed by your caregiver.  Place the ball of your right / left foot on a step, keeping your other foot firmly on the same step. Hold on to the wall or a rail for balance. Slowly lift your other foot, allowing your body weight to press your heel down over the edge of the step. You should feel a stretch in your right / left calf. Hold this position for 10 seconds. Repeat this exercise with a slight bend in your right / left knee. Repeat 3 times. Complete this stretch 2 times per day.   STRENGTHENING EXERCISES - Plantar Fasciitis (Heel Spur Syndrome)  These exercises may help you when beginning to rehabilitate your injury. They may resolve your symptoms with or without further involvement from your physician, physical therapist or athletic trainer. While completing these exercises, remember:  Muscles can  gain both the endurance and the strength needed for everyday activities through controlled exercises. Complete these exercises as instructed by your physician, physical therapist or athletic trainer. Progress the resistance and repetitions only as guided.  STRENGTH - Towel Curls Sit in a chair positioned on a non-carpeted surface. Place your foot on a towel, keeping your heel on the floor. Pull the towel toward your heel by only curling your toes. Keep your heel on the floor. Repeat 3 times.  Complete this exercise 2 times per day.  STRENGTH - Ankle Inversion Secure one end of a rubber exercise band/tubing to a fixed object (table, pole). Loop the other end around your foot just before your toes. Place your fists between your knees. This will focus your strengthening at your ankle. Slowly, pull your big toe up and in, making sure the band/tubing is positioned to resist the entire motion. Hold this position for 10 seconds. Have your muscles resist the band/tubing as it slowly pulls your foot back to the starting position. Repeat 3 times. Complete this exercises 2 times per day.  Document Released: 02/17/2005 Document Revised: 05/12/2011 Document Reviewed: 06/01/2008 [employer] Patient Information 2014 [place], [place].  --   Choose a moisturizer from  the list below:  For normal skin: Moisturize feet once daily; do not apply between toes CeraVe Daily Moisturizing Lotion Lubriderm Advanced Therapy Lotion or Lubriderm Intense Skin Repair Lotion Aquaphor Intensive Repair Lotion Gold Bond Ultimate Diabetic Foot Lotion Eucerin Intensive Repair Moisturizing Lotion  For extremely dry, cracked feet: moisturize feet once daily; do not apply between toes Bag Balm Skin Moisturizer (green tin can) CeraVe Healing Ointment Eucerin Aquaphor Advanced Repair Cream Vaseline Petroleum Healing Jelly   If you have problems reaching your feet: apply to feet once daily; do not apply between toes Aquaphor Advanced Therapy Ointment Body Spray  Vaseline Intensive Care Spray Moisturizer (Unscented,  [place] Radiant Spray or Aloe Smooth Spray)

## 2024-02-18 ENCOUNTER — Ambulatory Visit: Admitting: Podiatry

## 2024-02-19 ENCOUNTER — Encounter (INDEPENDENT_AMBULATORY_CARE_PROVIDER_SITE_OTHER): Admitting: Ophthalmology

## 2024-02-19 DIAGNOSIS — E113313 Type 2 diabetes mellitus with moderate nonproliferative diabetic retinopathy with macular edema, bilateral: Secondary | ICD-10-CM | POA: Diagnosis not present

## 2024-02-19 DIAGNOSIS — Z7984 Long term (current) use of oral hypoglycemic drugs: Secondary | ICD-10-CM | POA: Diagnosis not present

## 2024-02-19 DIAGNOSIS — H43813 Vitreous degeneration, bilateral: Secondary | ICD-10-CM | POA: Diagnosis not present

## 2024-02-19 DIAGNOSIS — H35033 Hypertensive retinopathy, bilateral: Secondary | ICD-10-CM | POA: Diagnosis not present

## 2024-02-19 DIAGNOSIS — I10 Essential (primary) hypertension: Secondary | ICD-10-CM

## 2024-02-22 ENCOUNTER — Telehealth: Payer: Self-pay | Admitting: Pharmacist

## 2024-02-22 NOTE — Telephone Encounter (Signed)
" °  °  This patient was appearing on a report for being at risk of failing the adherence measure for cholesterol (statin) medications this calendar year.   Medication: rosuvastatin  My chart message sent by PharmD and patient filled on 01/26/24 LDL >100    Gap closed Will continue to follow     Mliss Tarry Griffin, PharmD, BCACP, CPP Clinical Pharmacist, [facility] "

## 2024-03-18 ENCOUNTER — Encounter (INDEPENDENT_AMBULATORY_CARE_PROVIDER_SITE_OTHER): Admitting: Ophthalmology

## 2024-03-23 ENCOUNTER — Other Ambulatory Visit: Payer: Self-pay | Admitting: Family Medicine

## 2024-03-23 DIAGNOSIS — E1159 Type 2 diabetes mellitus with other circulatory complications: Secondary | ICD-10-CM

## 2024-04-01 ENCOUNTER — Encounter (INDEPENDENT_AMBULATORY_CARE_PROVIDER_SITE_OTHER): Admitting: Ophthalmology

## 2024-04-13 ENCOUNTER — Ambulatory Visit: Admitting: Family Medicine

## 2024-04-19 ENCOUNTER — Encounter (INDEPENDENT_AMBULATORY_CARE_PROVIDER_SITE_OTHER): Admitting: Ophthalmology

## 2024-05-05 ENCOUNTER — Ambulatory Visit: Admitting: Podiatry

## 2024-05-17 ENCOUNTER — Other Ambulatory Visit

## 2024-05-17 ENCOUNTER — Ambulatory Visit: Admitting: Vascular Surgery

## 2024-05-17 ENCOUNTER — Encounter

## 2024-12-02 ENCOUNTER — Ambulatory Visit: Payer: Self-pay
